# Patient Record
Sex: Female | Born: 1950
Health system: Southern US, Community
[De-identification: ages and names within clinical notes are randomized; demographics above are authoritative.]

## PROBLEM LIST (undated history)

## (undated) DIAGNOSIS — G4730 Sleep apnea, unspecified: Secondary | ICD-10-CM

## (undated) DIAGNOSIS — R51 Headache: Secondary | ICD-10-CM

## (undated) DIAGNOSIS — E785 Hyperlipidemia, unspecified: Secondary | ICD-10-CM

## (undated) DIAGNOSIS — I10 Essential (primary) hypertension: Secondary | ICD-10-CM

## (undated) DIAGNOSIS — R519 Headache, unspecified: Secondary | ICD-10-CM

## (undated) DIAGNOSIS — K219 Gastro-esophageal reflux disease without esophagitis: Secondary | ICD-10-CM

## (undated) DIAGNOSIS — N189 Chronic kidney disease, unspecified: Secondary | ICD-10-CM

## (undated) DIAGNOSIS — T7840XA Allergy, unspecified, initial encounter: Secondary | ICD-10-CM

## (undated) DIAGNOSIS — M1990 Unspecified osteoarthritis, unspecified site: Secondary | ICD-10-CM

## (undated) DIAGNOSIS — I639 Cerebral infarction, unspecified: Secondary | ICD-10-CM

## (undated) HISTORY — DX: Unspecified osteoarthritis, unspecified site: M19.90

## (undated) HISTORY — DX: Cerebral infarction, unspecified: I63.9

## (undated) HISTORY — DX: Hyperlipidemia, unspecified: E78.5

## (undated) HISTORY — DX: Allergy, unspecified, initial encounter: T78.40XA

## (undated) HISTORY — PX: BACK SURGERY: SHX140

## (undated) HISTORY — DX: Sleep apnea, unspecified: G47.30

## (undated) HISTORY — PX: KNEE ARTHROSCOPY: SUR90

## (undated) HISTORY — DX: Gastro-esophageal reflux disease without esophagitis: K21.9

---

## 1974-05-27 HISTORY — PX: ABDOMINAL HYSTERECTOMY: SHX81

## 1987-05-28 HISTORY — PX: LUMBAR LAMINECTOMY: SHX95

## 1997-10-25 ENCOUNTER — Ambulatory Visit (HOSPITAL_COMMUNITY): Admission: RE | Admit: 1997-10-25 | Discharge: 1997-10-25 | Payer: Self-pay | Admitting: Internal Medicine

## 1998-09-24 ENCOUNTER — Encounter: Payer: Self-pay | Admitting: Internal Medicine

## 1998-09-24 ENCOUNTER — Ambulatory Visit (HOSPITAL_COMMUNITY): Admission: RE | Admit: 1998-09-24 | Discharge: 1998-09-24 | Payer: Self-pay | Admitting: Internal Medicine

## 1999-12-08 ENCOUNTER — Emergency Department (HOSPITAL_COMMUNITY): Admission: EM | Admit: 1999-12-08 | Discharge: 1999-12-08 | Payer: Self-pay

## 1999-12-23 ENCOUNTER — Ambulatory Visit (HOSPITAL_BASED_OUTPATIENT_CLINIC_OR_DEPARTMENT_OTHER): Admission: RE | Admit: 1999-12-23 | Discharge: 1999-12-23 | Payer: Self-pay | Admitting: General Practice

## 2000-01-25 ENCOUNTER — Encounter: Payer: Self-pay | Admitting: Emergency Medicine

## 2000-01-25 ENCOUNTER — Emergency Department (HOSPITAL_COMMUNITY): Admission: EM | Admit: 2000-01-25 | Discharge: 2000-01-25 | Payer: Self-pay | Admitting: Emergency Medicine

## 2000-08-02 ENCOUNTER — Encounter: Payer: Self-pay | Admitting: Neurosurgery

## 2000-08-02 ENCOUNTER — Ambulatory Visit (HOSPITAL_COMMUNITY): Admission: RE | Admit: 2000-08-02 | Discharge: 2000-08-02 | Payer: Self-pay | Admitting: Neurosurgery

## 2001-03-30 ENCOUNTER — Encounter: Admission: RE | Admit: 2001-03-30 | Discharge: 2001-06-28 | Payer: Self-pay | Admitting: Internal Medicine

## 2002-12-12 ENCOUNTER — Encounter: Payer: Self-pay | Admitting: Emergency Medicine

## 2002-12-12 ENCOUNTER — Emergency Department (HOSPITAL_COMMUNITY): Admission: EM | Admit: 2002-12-12 | Discharge: 2002-12-12 | Payer: Self-pay | Admitting: Emergency Medicine

## 2003-05-01 ENCOUNTER — Emergency Department (HOSPITAL_COMMUNITY): Admission: EM | Admit: 2003-05-01 | Discharge: 2003-05-01 | Payer: Self-pay | Admitting: Emergency Medicine

## 2003-06-23 ENCOUNTER — Ambulatory Visit (HOSPITAL_COMMUNITY): Admission: RE | Admit: 2003-06-23 | Discharge: 2003-06-23 | Payer: Self-pay | Admitting: Internal Medicine

## 2004-05-12 ENCOUNTER — Emergency Department (HOSPITAL_COMMUNITY): Admission: EM | Admit: 2004-05-12 | Discharge: 2004-05-12 | Payer: Self-pay | Admitting: Family Medicine

## 2005-02-06 ENCOUNTER — Emergency Department (HOSPITAL_COMMUNITY): Admission: EM | Admit: 2005-02-06 | Discharge: 2005-02-06 | Payer: Self-pay | Admitting: Emergency Medicine

## 2005-02-07 ENCOUNTER — Ambulatory Visit: Payer: Self-pay | Admitting: Internal Medicine

## 2005-02-08 ENCOUNTER — Ambulatory Visit (HOSPITAL_COMMUNITY): Admission: RE | Admit: 2005-02-08 | Discharge: 2005-02-08 | Payer: Self-pay | Admitting: Internal Medicine

## 2005-05-14 ENCOUNTER — Ambulatory Visit: Payer: Self-pay | Admitting: Internal Medicine

## 2005-07-15 ENCOUNTER — Ambulatory Visit: Payer: Self-pay | Admitting: Internal Medicine

## 2005-07-18 ENCOUNTER — Ambulatory Visit: Payer: Self-pay | Admitting: *Deleted

## 2005-08-12 ENCOUNTER — Ambulatory Visit: Payer: Self-pay | Admitting: Internal Medicine

## 2005-08-21 ENCOUNTER — Ambulatory Visit: Payer: Self-pay | Admitting: Internal Medicine

## 2005-08-23 ENCOUNTER — Ambulatory Visit (HOSPITAL_COMMUNITY): Admission: RE | Admit: 2005-08-23 | Discharge: 2005-08-23 | Payer: Self-pay | Admitting: Family Medicine

## 2005-08-28 ENCOUNTER — Ambulatory Visit: Payer: Self-pay | Admitting: Gastroenterology

## 2005-09-27 ENCOUNTER — Encounter (INDEPENDENT_AMBULATORY_CARE_PROVIDER_SITE_OTHER): Payer: Self-pay | Admitting: Specialist

## 2005-09-27 ENCOUNTER — Ambulatory Visit: Payer: Self-pay | Admitting: Gastroenterology

## 2005-11-08 ENCOUNTER — Ambulatory Visit: Payer: Self-pay | Admitting: *Deleted

## 2006-02-21 ENCOUNTER — Ambulatory Visit: Payer: Self-pay | Admitting: Internal Medicine

## 2006-08-07 ENCOUNTER — Ambulatory Visit: Payer: Self-pay | Admitting: Internal Medicine

## 2006-08-28 ENCOUNTER — Ambulatory Visit: Payer: Self-pay | Admitting: Internal Medicine

## 2006-09-02 ENCOUNTER — Ambulatory Visit (HOSPITAL_COMMUNITY): Admission: RE | Admit: 2006-09-02 | Discharge: 2006-09-02 | Payer: Self-pay | Admitting: Internal Medicine

## 2006-09-15 ENCOUNTER — Encounter: Admission: RE | Admit: 2006-09-15 | Discharge: 2006-12-14 | Payer: Self-pay | Admitting: Internal Medicine

## 2006-10-10 ENCOUNTER — Ambulatory Visit: Payer: Self-pay | Admitting: Internal Medicine

## 2007-02-11 ENCOUNTER — Encounter (INDEPENDENT_AMBULATORY_CARE_PROVIDER_SITE_OTHER): Payer: Self-pay | Admitting: *Deleted

## 2007-02-11 ENCOUNTER — Telehealth (INDEPENDENT_AMBULATORY_CARE_PROVIDER_SITE_OTHER): Payer: Self-pay | Admitting: *Deleted

## 2007-02-13 DIAGNOSIS — G4730 Sleep apnea, unspecified: Secondary | ICD-10-CM | POA: Insufficient documentation

## 2007-02-13 DIAGNOSIS — E785 Hyperlipidemia, unspecified: Secondary | ICD-10-CM | POA: Insufficient documentation

## 2007-03-04 ENCOUNTER — Telehealth (INDEPENDENT_AMBULATORY_CARE_PROVIDER_SITE_OTHER): Payer: Self-pay | Admitting: *Deleted

## 2007-03-09 ENCOUNTER — Emergency Department (HOSPITAL_COMMUNITY): Admission: EM | Admit: 2007-03-09 | Discharge: 2007-03-09 | Payer: Self-pay | Admitting: Family Medicine

## 2007-03-12 ENCOUNTER — Ambulatory Visit: Payer: Self-pay | Admitting: Nurse Practitioner

## 2007-03-18 DIAGNOSIS — E1165 Type 2 diabetes mellitus with hyperglycemia: Secondary | ICD-10-CM

## 2007-03-18 DIAGNOSIS — E119 Type 2 diabetes mellitus without complications: Secondary | ICD-10-CM | POA: Insufficient documentation

## 2007-04-22 ENCOUNTER — Encounter (INDEPENDENT_AMBULATORY_CARE_PROVIDER_SITE_OTHER): Payer: Self-pay | Admitting: Internal Medicine

## 2007-05-25 ENCOUNTER — Ambulatory Visit: Payer: Self-pay | Admitting: Nurse Practitioner

## 2007-05-25 ENCOUNTER — Encounter (INDEPENDENT_AMBULATORY_CARE_PROVIDER_SITE_OTHER): Payer: Self-pay | Admitting: Internal Medicine

## 2007-05-26 ENCOUNTER — Telehealth (INDEPENDENT_AMBULATORY_CARE_PROVIDER_SITE_OTHER): Payer: Self-pay | Admitting: Nurse Practitioner

## 2007-05-27 ENCOUNTER — Telehealth (INDEPENDENT_AMBULATORY_CARE_PROVIDER_SITE_OTHER): Payer: Self-pay | Admitting: *Deleted

## 2007-06-05 ENCOUNTER — Ambulatory Visit (HOSPITAL_COMMUNITY): Admission: RE | Admit: 2007-06-05 | Discharge: 2007-06-05 | Payer: Self-pay | Admitting: Family Medicine

## 2007-06-09 ENCOUNTER — Telehealth (INDEPENDENT_AMBULATORY_CARE_PROVIDER_SITE_OTHER): Payer: Self-pay | Admitting: *Deleted

## 2007-07-02 ENCOUNTER — Encounter (INDEPENDENT_AMBULATORY_CARE_PROVIDER_SITE_OTHER): Payer: Self-pay | Admitting: Internal Medicine

## 2007-07-07 ENCOUNTER — Ambulatory Visit: Payer: Self-pay | Admitting: Internal Medicine

## 2007-07-07 LAB — CONVERTED CEMR LAB
Blood Glucose, Fingerstick: 93
Hgb A1c MFr Bld: 7.5 %

## 2007-07-10 ENCOUNTER — Telehealth (INDEPENDENT_AMBULATORY_CARE_PROVIDER_SITE_OTHER): Payer: Self-pay | Admitting: Internal Medicine

## 2007-07-15 ENCOUNTER — Telehealth (INDEPENDENT_AMBULATORY_CARE_PROVIDER_SITE_OTHER): Payer: Self-pay | Admitting: Internal Medicine

## 2007-08-12 ENCOUNTER — Telehealth (INDEPENDENT_AMBULATORY_CARE_PROVIDER_SITE_OTHER): Payer: Self-pay | Admitting: Internal Medicine

## 2007-11-24 ENCOUNTER — Encounter (INDEPENDENT_AMBULATORY_CARE_PROVIDER_SITE_OTHER): Payer: Self-pay | Admitting: Internal Medicine

## 2007-12-30 ENCOUNTER — Telehealth (INDEPENDENT_AMBULATORY_CARE_PROVIDER_SITE_OTHER): Payer: Self-pay | Admitting: Internal Medicine

## 2008-01-01 ENCOUNTER — Ambulatory Visit: Payer: Self-pay | Admitting: Internal Medicine

## 2008-01-01 DIAGNOSIS — I10 Essential (primary) hypertension: Secondary | ICD-10-CM | POA: Insufficient documentation

## 2008-01-03 ENCOUNTER — Ambulatory Visit (HOSPITAL_COMMUNITY): Admission: RE | Admit: 2008-01-03 | Discharge: 2008-01-03 | Payer: Self-pay | Admitting: Internal Medicine

## 2008-01-08 ENCOUNTER — Telehealth (INDEPENDENT_AMBULATORY_CARE_PROVIDER_SITE_OTHER): Payer: Self-pay | Admitting: Internal Medicine

## 2008-01-15 ENCOUNTER — Ambulatory Visit: Payer: Self-pay | Admitting: Internal Medicine

## 2008-01-16 DIAGNOSIS — M539 Dorsopathy, unspecified: Secondary | ICD-10-CM | POA: Insufficient documentation

## 2008-02-13 ENCOUNTER — Emergency Department (HOSPITAL_COMMUNITY): Admission: EM | Admit: 2008-02-13 | Discharge: 2008-02-13 | Payer: Self-pay | Admitting: Emergency Medicine

## 2008-02-17 ENCOUNTER — Telehealth (INDEPENDENT_AMBULATORY_CARE_PROVIDER_SITE_OTHER): Payer: Self-pay | Admitting: Internal Medicine

## 2008-02-22 ENCOUNTER — Emergency Department (HOSPITAL_COMMUNITY): Admission: EM | Admit: 2008-02-22 | Discharge: 2008-02-22 | Payer: Self-pay | Admitting: Emergency Medicine

## 2008-02-23 ENCOUNTER — Emergency Department (HOSPITAL_COMMUNITY): Admission: EM | Admit: 2008-02-23 | Discharge: 2008-02-23 | Payer: Self-pay | Admitting: Emergency Medicine

## 2008-02-26 ENCOUNTER — Ambulatory Visit: Payer: Self-pay | Admitting: Internal Medicine

## 2008-02-26 LAB — CONVERTED CEMR LAB
ALT: 53 units/L — ABNORMAL HIGH (ref 0–35)
Albumin: 4.3 g/dL (ref 3.5–5.2)
Alkaline Phosphatase: 73 units/L (ref 39–117)
Basophils Relative: 1 % (ref 0–1)
Bilirubin Urine: NEGATIVE
Blood in Urine, dipstick: NEGATIVE
CO2: 24 meq/L (ref 19–32)
Glucose, Urine, Semiquant: NEGATIVE
Hemoglobin: 14.1 g/dL (ref 12.0–15.0)
MCHC: 33.4 g/dL (ref 30.0–36.0)
MCV: 86.5 fL (ref 78.0–100.0)
Monocytes Absolute: 0.3 10*3/uL (ref 0.1–1.0)
Monocytes Relative: 8 % (ref 3–12)
Neutro Abs: 2.3 10*3/uL (ref 1.7–7.7)
Potassium: 4.4 meq/L (ref 3.5–5.3)
Protein, U semiquant: NEGATIVE
RBC: 4.88 M/uL (ref 3.87–5.11)
Sodium: 138 meq/L (ref 135–145)
Specific Gravity, Urine: >=1.03
Total Bilirubin: 0.4 mg/dL (ref 0.3–1.2)
Total Protein: 8.5 g/dL — ABNORMAL HIGH (ref 6.0–8.3)

## 2008-02-27 ENCOUNTER — Encounter (INDEPENDENT_AMBULATORY_CARE_PROVIDER_SITE_OTHER): Payer: Self-pay | Admitting: Internal Medicine

## 2008-02-29 ENCOUNTER — Ambulatory Visit (HOSPITAL_COMMUNITY): Admission: RE | Admit: 2008-02-29 | Discharge: 2008-02-29 | Payer: Self-pay | Admitting: Internal Medicine

## 2008-03-01 ENCOUNTER — Ambulatory Visit: Payer: Self-pay | Admitting: Internal Medicine

## 2008-03-11 LAB — CONVERTED CEMR LAB
HCV Ab: NEGATIVE
Hep A IgM: NEGATIVE
Hep B Core Total Ab: NEGATIVE
Hep B S Ab: NEGATIVE

## 2008-03-17 ENCOUNTER — Encounter (INDEPENDENT_AMBULATORY_CARE_PROVIDER_SITE_OTHER): Payer: Self-pay | Admitting: Internal Medicine

## 2008-03-17 ENCOUNTER — Ambulatory Visit: Payer: Self-pay | Admitting: Family Medicine

## 2008-03-17 LAB — CONVERTED CEMR LAB
AST: 21 units/L (ref 0–37)
Alkaline Phosphatase: 63 units/L (ref 39–117)
BUN: 10 mg/dL (ref 6–23)
Calcium: 8.3 mg/dL — ABNORMAL LOW (ref 8.4–10.5)
Creatinine, Ser: 0.63 mg/dL (ref 0.40–1.20)
HDL: 47 mg/dL (ref >39)
Hgb A1c MFr Bld: 7.5 %
LDL Cholesterol: 146 mg/dL — ABNORMAL HIGH (ref 0–99)
Total Bilirubin: 0.5 mg/dL (ref 0.3–1.2)
Total CHOL/HDL Ratio: 4.4
VLDL: 16 mg/dL (ref 0–40)

## 2008-04-03 ENCOUNTER — Emergency Department (HOSPITAL_COMMUNITY): Admission: EM | Admit: 2008-04-03 | Discharge: 2008-04-03 | Payer: Self-pay | Admitting: Emergency Medicine

## 2008-05-11 ENCOUNTER — Telehealth: Payer: Self-pay | Admitting: *Deleted

## 2008-05-12 ENCOUNTER — Telehealth (INDEPENDENT_AMBULATORY_CARE_PROVIDER_SITE_OTHER): Payer: Self-pay | Admitting: Internal Medicine

## 2008-05-16 ENCOUNTER — Encounter (INDEPENDENT_AMBULATORY_CARE_PROVIDER_SITE_OTHER): Payer: Self-pay | Admitting: Internal Medicine

## 2008-05-30 ENCOUNTER — Ambulatory Visit: Payer: Self-pay | Admitting: Family Medicine

## 2008-05-30 LAB — CONVERTED CEMR LAB

## 2008-06-01 ENCOUNTER — Encounter: Payer: Self-pay | Admitting: Family Medicine

## 2008-06-10 ENCOUNTER — Encounter (INDEPENDENT_AMBULATORY_CARE_PROVIDER_SITE_OTHER): Payer: Self-pay | Admitting: Internal Medicine

## 2008-06-23 ENCOUNTER — Telehealth: Payer: Self-pay | Admitting: Family Medicine

## 2008-06-23 ENCOUNTER — Ambulatory Visit: Payer: Self-pay | Admitting: Family Medicine

## 2008-07-26 ENCOUNTER — Ambulatory Visit: Payer: Self-pay | Admitting: Family Medicine

## 2008-07-26 DIAGNOSIS — J309 Allergic rhinitis, unspecified: Secondary | ICD-10-CM | POA: Insufficient documentation

## 2008-07-26 LAB — CONVERTED CEMR LAB: Hgb A1c MFr Bld: 7.2 %

## 2008-08-19 ENCOUNTER — Ambulatory Visit: Payer: Self-pay | Admitting: Family Medicine

## 2008-08-29 ENCOUNTER — Ambulatory Visit: Payer: Self-pay | Admitting: Family Medicine

## 2008-08-29 LAB — CONVERTED CEMR LAB: HDL goal, serum: 40 mg/dL

## 2008-09-26 ENCOUNTER — Ambulatory Visit: Payer: Self-pay | Admitting: Family Medicine

## 2009-02-20 ENCOUNTER — Ambulatory Visit: Payer: Self-pay | Admitting: Family Medicine

## 2009-02-22 ENCOUNTER — Encounter: Payer: Self-pay | Admitting: Family Medicine

## 2009-02-24 ENCOUNTER — Encounter: Payer: Self-pay | Admitting: Family Medicine

## 2009-02-27 ENCOUNTER — Telehealth: Payer: Self-pay | Admitting: *Deleted

## 2009-03-27 ENCOUNTER — Encounter: Payer: Self-pay | Admitting: *Deleted

## 2009-03-27 ENCOUNTER — Ambulatory Visit: Payer: Self-pay | Admitting: Family Medicine

## 2009-03-27 LAB — CONVERTED CEMR LAB
AST: 21 units/L (ref 0–37)
Albumin: 4.4 g/dL (ref 3.5–5.2)
BUN: 13 mg/dL (ref 6–23)
CO2: 26 meq/L (ref 19–32)
Calcium: 8.7 mg/dL (ref 8.4–10.5)
Chloride: 105 meq/L (ref 96–112)
Cholesterol: 160 mg/dL (ref 0–200)
HDL: 47 mg/dL (ref >39)
Potassium: 3.6 meq/L (ref 3.5–5.3)

## 2009-04-10 ENCOUNTER — Ambulatory Visit: Payer: Self-pay | Admitting: Family Medicine

## 2009-07-04 ENCOUNTER — Emergency Department (HOSPITAL_COMMUNITY): Admission: EM | Admit: 2009-07-04 | Discharge: 2009-07-04 | Payer: Self-pay | Admitting: Emergency Medicine

## 2009-11-22 ENCOUNTER — Ambulatory Visit: Payer: Self-pay | Admitting: Family Medicine

## 2009-11-22 DIAGNOSIS — M7710 Lateral epicondylitis, unspecified elbow: Secondary | ICD-10-CM | POA: Insufficient documentation

## 2009-11-22 LAB — CONVERTED CEMR LAB: Hgb A1c MFr Bld: 6.9 %

## 2009-12-19 ENCOUNTER — Telehealth: Payer: Self-pay | Admitting: *Deleted

## 2010-04-06 ENCOUNTER — Encounter: Payer: Self-pay | Admitting: Family Medicine

## 2010-06-17 ENCOUNTER — Encounter: Payer: Self-pay | Admitting: Internal Medicine

## 2010-06-26 NOTE — Assessment & Plan Note (Signed)
Summary: back pain,tcb   Vital Signs:  Patient profile:   60 year old female Height:      62.7 inches Weight:      164 pounds BMI:     29.44 BSA:     1.77 Temp:     98.6 degrees F Pulse rate:   77 / minute BP sitting:   156 / 91  Vitals Entered By: Jone Baseman CMA (November 22, 2009 11:28 AM) CC: back pain Is Patient Diabetic? Yes Did you bring your meter with you today? No Pain Assessment Patient in pain? yes     Location: back, hips and legs Intensity: 8   CC:  back pain.  History of Present Illness: Back Pain continues to have significant back pain primarily bilaterally  in the lower back.  No incontinence is better with rest and wose with movement.  No radiation to lower legs but they sometimes feel weak from the pain.  She never started lyrica because of cost.   Has seen NS in 2009.  Has done PT in distant past and worn a TENS unit.  DIABETES Disease Monitoring Blood Sugar ranges:not checkign   Polyuria:N   Visual problems:N Medications Compliance:daily amyrl   Hypoglycemic symptoms:N  Pain in left elbow for last few weeks.  Worse with movement.  No trauma.  No loss of sensation or strength  ROS - as above PMH - Medications reviewed and updated in medication list.  Smoking Status noted in VS form    Habits & Providers  Alcohol-Tobacco-Diet     Tobacco Status: never  Current Medications (verified): 1)  Amaryl 4 Mg  Tabs (Glimepiride) .Marland Kitchen.. 1 Tab By Mouth Daily 2)  Norvasc 5 Mg  Tabs (Amlodipine Besylate) .Marland Kitchen.. 1 By Mouth Once Daily For Blood Pressure 3)  Flexeril 10 Mg Tabs (Cyclobenzaprine Hcl) .Marland Kitchen.. 1 As Needed For Severe Muscle Pain 4)  Lisinopril 10 Mg  Tabs (Lisinopril) .... Take 1 Tab By Mouth Daily For Blood Pressure 5)  Simvastatin 20 Mg Tabs (Simvastatin) .Marland Kitchen.. 1 By Mouth At Bedtime For Cholesterol 6)  Fluticasone Propionate 50 Mcg/act  Susp (Fluticasone Propionate) .... 2 Sprays Each Nostril Once Daily. 1 Mdi 7)  Vicodin 5-500 Mg Tabs  (Hydrocodone-Acetaminophen) .... One Tab Up To Once A Day For Severe Pain 8)  Ambien 10 Mg Tabs (Zolpidem Tartrate) .Marland Kitchen.. 1 By Mouth At Bedtime Not More Than Twice A Week  Allergies: 1)  ! Asa 2)  ! Pcn 3)  ! * Dye  Social History: Smoking Status:  never  Physical Exam  General:  Able to move around room and get on exam table but slowly stiffly with pain  Msk:  Back - no deformity or focal tenderness R elbow - focally tender over lat epicondyle without deformity.   Neurologic:  Distal strength in all extremities 5/5.  Mild SLR pain bitaterally.  Good range of motion of both hips   Diabetes Management Exam:    Foot Exam (with socks and/or shoes not present):       Sensory-Pinprick/Light touch:          Left medial foot (L-4): normal          Left dorsal foot (L-5): normal          Left lateral foot (S-1): normal          Right medial foot (L-4): normal          Right dorsal foot (L-5): normal  Right lateral foot (S-1): normal       Sensory-Monofilament:          Left foot: normal          Right foot: normal       Inspection:          Left foot: normal          Right foot: normal       Nails:          Left foot: normal          Right foot: normal   Impression & Recommendations:  Problem # 1:  DEGENERATIVE JOINT DISEASE (ICD-715.90) Assessment Deteriorated  Worsened pain in her back.  No signs of cancer or fracture or impingement.  Has been evaluated by NS in 2009.  Recent normal plain films.  Will give trial of desipramine for chronic pain if not sufficient will refer to PT perhaps for another TENS.  Discussed regular chronic narcotic use would not be good   Her updated medication list for this problem includes:    Vicodin 5-500 Mg Tabs (Hydrocodone-acetaminophen) ..... One tab up to once a day for severe pain  Orders: [facility]- Est  Level 4 (47425)  Problem # 2:  DIABETES MELLITUS (ICD-250.00) Good control  Her updated medication list for this problem includes:     Amaryl 4 Mg Tabs (Glimepiride) .Marland Kitchen... 1 tab by mouth daily    Lisinopril 10 Mg Tabs (Lisinopril) .Marland Kitchen... Take 1 tab by mouth daily for blood pressure  Orders: A1C-[facility] (95638) [facility]- Est  Level 4 (75643)  Labs Reviewed: Creat: 0.59 (03/27/2009)    Reviewed HgBA1c results: 6.9 (11/22/2009)  7.0 (02/20/2009)  Problem # 3:  LATERAL EPICONDYLITIS (ICD-726.32)  limited on what medications can use due to NSAID intolerance.  Will use ice and time  Orders: [facility]- Est  Level 4 (32951)  Complete Medication List: 1)  Amaryl 4 Mg Tabs (Glimepiride) .Marland Kitchen.. 1 tab by mouth daily 2)  Norvasc 5 Mg Tabs (Amlodipine besylate) .Marland Kitchen.. 1 by mouth once daily for blood pressure 3)  Flexeril 10 Mg Tabs (Cyclobenzaprine hcl) .Marland Kitchen.. 1 as needed for severe muscle pain 4)  Lisinopril 10 Mg Tabs (Lisinopril) .... Take 1 tab by mouth daily for blood pressure 5)  Simvastatin 20 Mg Tabs (Simvastatin) .Marland Kitchen.. 1 by mouth at bedtime for cholesterol 6)  Fluticasone Propionate 50 Mcg/act Susp (Fluticasone propionate) .... 2 sprays each nostril once daily. 1 mdi 7)  Vicodin 5-500 Mg Tabs (Hydrocodone-acetaminophen) .... One tab up to once a day for severe pain 8)  Ambien 10 Mg Tabs (Zolpidem tartrate) .Marland Kitchen.. 1 by mouth at bedtime not more than twice a week 9)  Desipramine Hcl 25 Mg Tabs (Desipramine hcl) .Marland Kitchen.. 1 every at bedtime increase by one tablet every 3 days until taking 4 tablets a night  Patient Instructions: 1)  Please schedule a follow-up appointment in 3 months .  2)  Call to say how the desipramine is workign after you've taken for about a 1 month 3)  Good work on your diabetes 4)  For your tendonitis on your elbow - ice for 20 minutes three times a day  5)  Schedule your mammogram.  Prescriptions: DESIPRAMINE HCL 25 MG TABS (DESIPRAMINE HCL) 1 every at bedtime increase by one tablet every 3 days until taking 4 tablets a night  #90 x 1   Entered and Authorized by:   Pearlean Brownie MD   Signed by:   Pearlean Brownie  MD  on 11/22/2009   Method used:   Print then Give to Patient   RxID:   [id number]   Laboratory Results   Blood Tests   Date/Time Received: November 22, 2009 11:23 AM  Date/Time Reported: November 22, 2009 11:45 AM   HGBA1C: 6.9%   (Normal Range: Non-Diabetic - 3-6%   Control Diabetic - 6-8%)  Comments: ...........test performed by............Marland KitchenBAJordan, MT(ASCP)11:45 AM entered by Terese Door, CMA         Prevention & Chronic Care Immunizations   Influenza vaccine: Fluvax Non-MCR  (03/27/2009)   Influenza vaccine due: 03/17/2009    Tetanus booster: 09/26/2008: Tdap    Pneumococcal vaccine: Historical  (06/27/2004)   Pneumococcal vaccine due: None  Colorectal Screening   Hemoccult: Not documented   Hemoccult due: Not Indicated    Colonoscopy: Tubular adenoa repeat in 5 yrs  (10/17/2005)   Colonoscopy due: 10/18/2010  Other Screening   Pap smear: Hysterectomy in mid 20s  (05/30/2008)   Pap smear due: Not Indicated    Mammogram: Not documented   Smoking status: never  (11/22/2009)  Diabetes Mellitus   HgbA1C: 6.9  (11/22/2009)   Hemoglobin A1C due: 06/17/2008    Eye exam: Not documented    Foot exam: yes  (11/22/2009)   Foot exam action/deferral: Do today   High risk foot: Not documented   Foot care education: Not documented   Foot exam due: 05/30/2009    Urine microalbumin/creatinine ratio: Not documented    Diabetes flowsheet reviewed?: Yes   Progress toward A1C goal: At goal  Lipids   Total Cholesterol: 160  (03/27/2009)   LDL: 97  (03/27/2009)   LDL Direct: Not documented   HDL: 47  (03/27/2009)   Triglycerides: 79  (03/27/2009)    SGOT (AST): 21  (03/27/2009)   SGPT (ALT): 30  (03/27/2009)   Alkaline phosphatase: 71  (03/27/2009)   Total bilirubin: 0.4  (03/27/2009)    Lipid flowsheet reviewed?: Yes   Progress toward LDL goal: At goal  Hypertension   Last Blood Pressure: 156 / 91  (11/22/2009)   Serum creatinine: 0.59  (03/27/2009)    Serum potassium 3.6  (03/27/2009)    Hypertension flowsheet reviewed?: Yes   Progress toward BP goal: At goal  Self-Management Support :   Personal Goals (by the next clinic visit) :     Personal A1C goal: 8  (02/20/2009)     Personal blood pressure goal: 140/90  (02/20/2009)     Personal LDL goal: 100  (02/20/2009)    Diabetes self-management support: Not documented    Diabetes self-management support not done because: Good outcomes  (02/20/2009)    Hypertension self-management support: Not documented    Hypertension self-management support not done because: Good outcomes  (02/20/2009)    Lipid self-management support: Lipid monitoring log, Written self-care plan  (02/20/2009)    Nursing Instructions: Diabetic foot exam today Diabetic foot exam today

## 2010-06-26 NOTE — Progress Notes (Signed)
Summary: meds  Phone Note Call from Patient Call back at Home Phone [phone]   Caller: Patient Summary of Call: cannot afford brand name needs generic for DESIPRAMINE HCL 25 MG [facility] (pls check to see if they have generic, if not call to [facility] [address]) and let pt know  Ambien & Hydrocodone called to [facility] for refill  Initial call taken by: De Nurse,  December 19, 2009 2:18 PM  Follow-up for Phone Call        Please call in vicodin to the [facility] pharmacy.  Sent rx for desipramine to [facility].  Should not take ambien with the desipramine.  Please let her know Follow-up by: Pearlean Brownie MD,  December 19, 2009 3:08 PM  Additional Follow-up for Phone Call Additional follow up Details #1::        Desipramine too expensive.  [facility]riptylene [facility] Additional Follow-up by: Pearlean Brownie MD,  December 20, 2009 10:34 AM    Additional Follow-up for Phone Call Additional follow up Details #2::    LMOVM for Callback Follow-up by: Jone Baseman CMA,  December 20, 2009 10:50 AM  Additional Follow-up for Phone Call Additional follow up Details #3:: Details for Additional Follow-up Action Taken: Please call pt back concerning the meds sent in for her. Additional Follow-up by: Clydell Hakim,  December 21, 2009 11:02 AM  New/Updated Medications: VICODIN 5-500 MG TABS (HYDROCODONE-ACETAMINOPHEN) one tab up to once a day for severe pain.  May fill monthly NORTRIPTYLINE HCL 25 MG CAPS (NORTRIPTYLINE HCL) 1 by mouth at bedtime Increase by 1 capsule every 3-4 days until taking 4 at bedtime   Pt informed of the above.  Rx called in as requested. ............................................... Shanda Bumps [facility] December 21, 2009 11:21 AM   Prescriptions: NORTRIPTYLINE HCL 25 MG CAPS (NORTRIPTYLINE HCL) 1 by mouth at bedtime Increase by 1 capsule every 3-4 days until taking 4 at bedtime  #90 x 1   Entered and Authorized by:    Pearlean Brownie MD   Signed by:   Pearlean Brownie MD on 12/20/2009   Method used:   Telephoned to ...       [facility] (retail)       [address] [place], [place]  [address]       Ph: [phone]       Fax: [phone]   RxID:   [id number] VICODIN 5-500 MG TABS (HYDROCODONE-ACETAMINOPHEN) one tab up to once a day for severe pain.  May fill monthly  #30 x 2   Entered and Authorized by:   Pearlean Brownie MD   Signed by:   Pearlean Brownie MD on 12/19/2009   Method used:   Telephoned to ...       [facility]* (retail)       [address]       [address]       [place], [place]  [address]       Ph: [phone]       Fax: [phone]   RxID:   [id number] DESIPRAMINE HCL 25 MG TABS (DESIPRAMINE HCL) 1 every at bedtime increase by one tablet every 3 days until taking 4 tablets a night  #90 x 0   Entered and Authorized by:   Pearlean Brownie MD   Signed by:   Pearlean Brownie MD on 12/19/2009   Method used:  Electronically to        [facility] [phone]* (retail)       [address]       [place], [place]  [address]       Ph: [phone]       Fax: [phone]   RxID:   [id number]

## 2010-06-26 NOTE — Miscellaneous (Signed)
  Clinical Lists Changes  Problems: Removed problem of HEADACHE (ICD-784.0) Removed problem of LIVER FUNCTION TESTS, ABNORMAL, HX OF (ICD-V12.2) Removed problem of NECK PAIN, LEFT (ICD-723.1) Removed problem of KNEE PAIN (ICD-719.46)

## 2010-06-29 ENCOUNTER — Emergency Department (HOSPITAL_COMMUNITY)
Admission: EM | Admit: 2010-06-29 | Discharge: 2010-06-30 | Disposition: A | Discharge status: Home / Self Care | Payer: Self-pay | Attending: Emergency Medicine | Admitting: Emergency Medicine

## 2010-06-29 DIAGNOSIS — I10 Essential (primary) hypertension: Secondary | ICD-10-CM | POA: Insufficient documentation

## 2010-06-29 DIAGNOSIS — R1033 Periumbilical pain: Secondary | ICD-10-CM | POA: Insufficient documentation

## 2010-06-29 DIAGNOSIS — E119 Type 2 diabetes mellitus without complications: Secondary | ICD-10-CM | POA: Insufficient documentation

## 2010-06-30 LAB — COMPREHENSIVE METABOLIC PANEL
ALT: 20 U/L (ref 0–35)
CO2: 24 mEq/L (ref 19–32)
Calcium: 9 mg/dL (ref 8.4–10.5)
Creatinine, Ser: 0.64 mg/dL (ref 0.4–1.2)
GFR calc Af Amer: >60 mL/min (ref >60)
GFR calc non Af Amer: >60 mL/min (ref >60)
Glucose, Bld: 287 mg/dL — ABNORMAL HIGH (ref 70–99)
Sodium: 135 mEq/L (ref 135–145)
Total Protein: 8.5 g/dL — ABNORMAL HIGH (ref 6.0–8.3)

## 2010-06-30 LAB — CBC
Platelets: 226 10*3/uL (ref 150–400)
RBC: 4.77 MIL/uL (ref 3.87–5.11)
RDW: 12.5 % (ref 11.5–15.5)
WBC: 4.5 10*3/uL (ref 4.0–10.5)

## 2010-06-30 LAB — LIPASE, BLOOD: Lipase: 27 U/L (ref 11–59)

## 2010-06-30 LAB — URINALYSIS, ROUTINE W REFLEX MICROSCOPIC
Bilirubin Urine: NEGATIVE
Leukocytes, UA: NEGATIVE
Nitrite: NEGATIVE
Specific Gravity, Urine: 1.041 — ABNORMAL HIGH (ref 1.005–1.030)
Urobilinogen, UA: 1 mg/dL (ref 0.0–1.0)
pH: 5.5 (ref 5.0–8.0)

## 2010-06-30 LAB — DIFFERENTIAL
Basophils Absolute: 0 10*3/uL (ref 0.0–0.1)
Basophils Relative: 0 % (ref 0–1)
Eosinophils Absolute: 0.1 10*3/uL (ref 0.0–0.7)
Eosinophils Relative: 2 % (ref 0–5)
Neutrophils Relative %: 59 % (ref 43–77)

## 2010-07-10 ENCOUNTER — Other Ambulatory Visit: Payer: Self-pay | Admitting: Family Medicine

## 2010-07-10 MED ORDER — AMLODIPINE BESYLATE 5 MG PO TABS: 5.0000 mg | ORAL_TABLET | Freq: Every day | ORAL | Status: DC

## 2010-07-19 ENCOUNTER — Other Ambulatory Visit: Payer: Self-pay | Admitting: Family Medicine

## 2010-07-19 DIAGNOSIS — G4700 Insomnia, unspecified: Secondary | ICD-10-CM

## 2010-07-19 DIAGNOSIS — M1990 Unspecified osteoarthritis, unspecified site: Secondary | ICD-10-CM

## 2010-07-19 MED ORDER — ZOLPIDEM TARTRATE 10 MG PO TABS
10.0000 mg | ORAL_TABLET | Freq: Every evening | ORAL | Status: DC | PRN
Start: 2010-07-19 — End: 2010-10-08

## 2010-07-19 MED ORDER — HYDROCODONE-ACETAMINOPHEN 5-500 MG PO TABS: 1.0000 | ORAL_TABLET | Freq: Every day | ORAL | Status: DC | PRN

## 2010-08-02 ENCOUNTER — Telehealth: Payer: Self-pay | Admitting: Family Medicine

## 2010-08-02 ENCOUNTER — Ambulatory Visit (INDEPENDENT_AMBULATORY_CARE_PROVIDER_SITE_OTHER): Payer: Self-pay | Admitting: Family Medicine

## 2010-08-02 ENCOUNTER — Encounter: Payer: Self-pay | Admitting: Family Medicine

## 2010-08-02 VITALS — BP 124/76 | HR 78 | Wt 162.0 lb

## 2010-08-02 DIAGNOSIS — M1990 Unspecified osteoarthritis, unspecified site: Secondary | ICD-10-CM

## 2010-08-02 DIAGNOSIS — E785 Hyperlipidemia, unspecified: Secondary | ICD-10-CM

## 2010-08-02 LAB — CONVERTED CEMR LAB
BUN: 12 mg/dL (ref 6–23)
CO2: 22 meq/L (ref 19–32)
Chloride: 102 meq/L (ref 96–112)
Glucose, Bld: 267 mg/dL — ABNORMAL HIGH (ref 70–99)
Potassium: 3.8 meq/L (ref 3.5–5.3)
Sodium: 137 meq/L (ref 135–145)

## 2010-08-02 LAB — BASIC METABOLIC PANEL
CO2: 22 mEq/L (ref 19–32)
Chloride: 102 mEq/L (ref 96–112)
Potassium: 3.8 mEq/L (ref 3.5–5.3)

## 2010-08-02 MED ORDER — PREDNISONE 20 MG PO TABS: ORAL_TABLET | ORAL | Status: DC

## 2010-08-02 MED ORDER — SIMVASTATIN 20 MG PO TABS: 20.0000 mg | ORAL_TABLET | Freq: Every day | ORAL | Status: DC

## 2010-08-02 MED ORDER — CYCLOBENZAPRINE HCL 10 MG PO TABS: 5.0000 mg | ORAL_TABLET | Freq: Two times a day (BID) | ORAL | Status: DC | PRN

## 2010-08-02 MED ORDER — DESIPRAMINE HCL 25 MG PO TABS: ORAL_TABLET | ORAL | Status: DC

## 2010-08-02 NOTE — Assessment & Plan Note (Signed)
Chronic history of back pain, hip, pain.  Has been evaluated by [facility].  She currently takes vicodin for pain.  Today states that she is having some cramping of lower legs and locking of knees.  Tramadol did not help.  Has ho of gi upset with NSAIDS.  Lyrica was too expensive.  Pt not sure if Nortriptyline helped.    We discussed that DJD will leave her with chronic pain and there are times when the pain will be worse than other times.  Pt states that her mother has arthritis and she sees the pain her mother is in and does not want this her.  We discussed that I want to rule out hypokalemia as cause of cramping pain or that an inflammatory process is causing her joint pain.  Will check Bmet and ESR today.  Will try Desipramine since it may help with pain and cramping.  She can try 25mg  for 1 wk then increase it to 50mg  at night or 25mg  bid.   Advised pt to see Dr Deirdre Priest for f/u.  I will refill flexeril per pt's request.

## 2010-08-02 NOTE — Assessment & Plan Note (Signed)
Refilled Simvastatin for 1 month.  Pt has been out of it for a month.

## 2010-08-02 NOTE — Telephone Encounter (Signed)
Called pt re ESR 43, which is elevated for her age of 13.  Discussed the implications of this and that Prednisone may help. Discussed that I am not sure that Prednisone will work 50% or 90%.  Discussed side effects with pt: fluid/Na retention which may lead to elevation in BP (she already has HTN),  Insomnia, nervousness, increased appetite, headache, mood swings.  Discussed that she does not have to take Prednisone if she does not want to.  Pt asked for other pain medicine.  I declined and discussed that she can take the Desipramine I Rx earlier today.  Pt states she wanted to try Prednisone. I called in Pred[facility]one 40mg  [facility] x [facility]

## 2010-08-02 NOTE — Progress Notes (Signed)
  Subjective:    Patient ID: Tiffany [name], female    DOB: December 23, 1950, 60 y.o.   MRN: [id number]  HPI BACK PAIN "I have problems with my back all the time anyway, but now it is going into my legs."  She also complains of locking of hip and knees that cause her to almost fall.  Left knee is worse right knee.  She states multiple joint pains, even in her elbows and fingers.  Things have gotten worse in the past 3 wks in the hip.   Modifying factors: Has extensive history of DJD.  Has been seen by [facility] 2009 in the past.  She has had PT in the past, but today states that it made it worse in that her back spasms were worse with PT.  Back spasm is not bothering her much right now.  Trying to use hot compress for cramping in her legs. Has tried gabapentin, which did not help.  Never took Lyrica because it was too expensive.  Nortriptyline did not really help.    Red Flags Fecal/urinary incontinence: no   Weakness: no Fever/chills: no Night pain: no.  Pain all the time. Unexplained weight loss: no No relief with bedrest: not able to sleep Cancer/immunosuppression: no IV drug use: no PMH of osteoporosis or chronic steroid use: no    Review of Systems Per hpi     Objective:   Physical Exam Gen: NAD, vitals reviewed MUSC: no spinal abnormality.  +tenderness with deep palpation in her back.  Sitting and lying straight and cross leg raise causes low back pain.  No palpable pain in knees.  No swelling knees.  +crepitus in both needs.  Ankles stable.  Sensation intact in b/l LE and feet.  LE reflexes present +2.  Strength 4/5 in b/l LE.         Assessment & Plan:

## 2010-08-02 NOTE — Patient Instructions (Signed)
Please make an appointment with Dr Deirdre Priest in 2-4 weeks.  I will check labs today to see if your cramping is due to low potassium or related to an inflammatory process.  I will call you with the result and if new medicine needed to be given for these. For your cramping pain you can try Desipramine 25mg  at night for the first week.  During the 2nd week you can increase this to 2 tablets at night if it seems to help.  You can also take one tablet twice a day if it is helpful.

## 2010-08-13 ENCOUNTER — Ambulatory Visit (INDEPENDENT_AMBULATORY_CARE_PROVIDER_SITE_OTHER): Payer: Self-pay

## 2010-08-13 ENCOUNTER — Inpatient Hospital Stay (INDEPENDENT_AMBULATORY_CARE_PROVIDER_SITE_OTHER)
Admission: RE | Admit: 2010-08-13 | Discharge: 2010-08-13 | Disposition: A | Discharge status: Home / Self Care | Payer: Self-pay | Source: Ambulatory Visit | Attending: Emergency Medicine | Admitting: Emergency Medicine

## 2010-08-13 DIAGNOSIS — M65849 Other synovitis and tenosynovitis, unspecified hand: Secondary | ICD-10-CM

## 2010-08-13 DIAGNOSIS — M65839 Other synovitis and tenosynovitis, unspecified forearm: Secondary | ICD-10-CM

## 2010-08-15 ENCOUNTER — Ambulatory Visit: Payer: Self-pay | Admitting: Family Medicine

## 2010-08-31 ENCOUNTER — Other Ambulatory Visit: Payer: Self-pay | Admitting: Family Medicine

## 2010-08-31 NOTE — Telephone Encounter (Signed)
Refill request

## 2010-09-05 NOTE — Telephone Encounter (Signed)
Called in and patient informed.

## 2010-09-17 ENCOUNTER — Other Ambulatory Visit: Payer: Self-pay | Admitting: Family Medicine

## 2010-09-17 DIAGNOSIS — M1990 Unspecified osteoarthritis, unspecified site: Secondary | ICD-10-CM

## 2010-09-17 MED ORDER — LISINOPRIL 10 MG PO TABS
10.0000 mg | ORAL_TABLET | Freq: Every day | ORAL | Status: DC
Start: 2010-09-17 — End: 2010-11-19

## 2010-09-17 MED ORDER — SIMVASTATIN 20 MG PO TABS: 20.0000 mg | ORAL_TABLET | Freq: Every day | ORAL | Status: DC

## 2010-09-17 MED ORDER — CYCLOBENZAPRINE HCL 10 MG PO TABS: 5.0000 mg | ORAL_TABLET | Freq: Two times a day (BID) | ORAL | Status: DC | PRN

## 2010-10-08 ENCOUNTER — Encounter: Payer: Self-pay | Admitting: Family Medicine

## 2010-10-08 ENCOUNTER — Ambulatory Visit (INDEPENDENT_AMBULATORY_CARE_PROVIDER_SITE_OTHER): Payer: Self-pay | Admitting: Family Medicine

## 2010-10-08 DIAGNOSIS — E119 Type 2 diabetes mellitus without complications: Secondary | ICD-10-CM

## 2010-10-08 DIAGNOSIS — M1990 Unspecified osteoarthritis, unspecified site: Secondary | ICD-10-CM

## 2010-10-08 DIAGNOSIS — E785 Hyperlipidemia, unspecified: Secondary | ICD-10-CM

## 2010-10-08 DIAGNOSIS — I10 Essential (primary) hypertension: Secondary | ICD-10-CM

## 2010-10-08 DIAGNOSIS — G4700 Insomnia, unspecified: Secondary | ICD-10-CM

## 2010-10-08 LAB — LIPID PANEL
HDL: 52 mg/dL (ref >39)
LDL Cholesterol: 165 mg/dL — ABNORMAL HIGH (ref 0–99)

## 2010-10-08 LAB — HEPATIC FUNCTION PANEL
ALT: 17 U/L (ref 0–35)
AST: 15 U/L (ref 0–37)
Albumin: 4.2 g/dL (ref 3.5–5.2)
Alkaline Phosphatase: 74 U/L (ref 39–117)
Indirect Bilirubin: 0.4 mg/dL (ref 0.0–0.9)
Total Protein: 8 g/dL (ref 6.0–8.3)

## 2010-10-08 LAB — CK: Total CK: 64 U/L (ref 7–177)

## 2010-10-08 MED ORDER — GLIMEPIRIDE 4 MG PO TABS: 4.0000 mg | ORAL_TABLET | Freq: Every day | ORAL | Status: DC

## 2010-10-08 MED ORDER — ZOLPIDEM TARTRATE 10 MG PO TABS
10.0000 mg | ORAL_TABLET | Freq: Every evening | ORAL | Status: DC | PRN
Start: 2010-10-08 — End: 2011-02-01

## 2010-10-08 MED ORDER — CYCLOBENZAPRINE HCL 10 MG PO TABS: 10.0000 mg | ORAL_TABLET | Freq: Every day | ORAL | Status: DC | PRN

## 2010-10-08 MED ORDER — ZOLPIDEM TARTRATE 10 MG PO TABS: 10.0000 mg | ORAL_TABLET | Freq: Every evening | ORAL | Status: DC | PRN

## 2010-10-08 MED ORDER — HYDROCODONE-ACETAMINOPHEN 5-500 MG PO TABS: 1.0000 | ORAL_TABLET | Freq: Every day | ORAL | Status: DC | PRN

## 2010-10-08 NOTE — Progress Notes (Signed)
  Subjective:    Patient ID: Tiffany [name], female    DOB: [birth year], 60 y.o.   MRN: [id number]  HPI  Joint Pain Location: both knees and back and both shoulders L > R.   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  Years ago had injections.  Recent ESR was 40.   Has tried gabapentin and nortriptylene without help  HYPERTENSION Disease Monitoring Blood pressure range-not checking Chest pain- no      Dyspnea- no Medications Compliance- lisinopril daily Lightheadedness- no   Edema- no   Review of Symptoms - see HPI  PMH - Smoking status noted.    Review of Systems     Objective:   Physical Exam    Able to get up and down from exam table dress and undress without problems and in a reasonable time Pain with walking on toes and heels and can only deep knee bend a few inches FROM of both knees and shoulders but with pain and mild crepitus.  No effusions.  No locking Skin - no rashes Hands - no swelling of joints     Assessment & Plan:

## 2010-10-08 NOTE — Patient Instructions (Signed)
Check your blood pressure 1-2 x a week and bring in the readings next week  Use Tylenol and vicodin and flexaril as needed for severe pain  I will call you if your lab tests are not normal.  Otherwise we will discuss them at your next visit.

## 2010-10-08 NOTE — Assessment & Plan Note (Signed)
Not well controlled today.  Have her check readings at home and follow up on one month

## 2010-10-08 NOTE — Assessment & Plan Note (Signed)
Diffuse pain in joints and muscles.  I think this is a combination of DJD and possibly fibromyalgia. Her pain seems out of proportion to physical findings  Will check a CK.   All really have to offer is prn analgesics.   If CK is negative consider PT referral and perhaps trial off statins

## 2010-11-19 ENCOUNTER — Encounter: Payer: Self-pay | Admitting: Family Medicine

## 2010-11-19 ENCOUNTER — Ambulatory Visit (INDEPENDENT_AMBULATORY_CARE_PROVIDER_SITE_OTHER): Payer: Self-pay | Admitting: Family Medicine

## 2010-11-19 DIAGNOSIS — E119 Type 2 diabetes mellitus without complications: Secondary | ICD-10-CM

## 2010-11-19 DIAGNOSIS — I10 Essential (primary) hypertension: Secondary | ICD-10-CM

## 2010-11-19 DIAGNOSIS — M1990 Unspecified osteoarthritis, unspecified site: Secondary | ICD-10-CM

## 2010-11-19 DIAGNOSIS — E785 Hyperlipidemia, unspecified: Secondary | ICD-10-CM

## 2010-11-19 MED ORDER — GLIMEPIRIDE 4 MG PO TABS: 4.0000 mg | ORAL_TABLET | Freq: Every day | ORAL | Status: DC

## 2010-11-19 MED ORDER — LISINOPRIL 20 MG PO TABS: 20.0000 mg | ORAL_TABLET | Freq: Every day | ORAL | Status: DC

## 2010-11-19 MED ORDER — SIMVASTATIN 40 MG PO TABS: 40.0000 mg | ORAL_TABLET | Freq: Every evening | ORAL | Status: DC

## 2010-11-19 NOTE — Assessment & Plan Note (Addendum)
Still pain in both knees and her back, her muscle ache and hands feel intermittently stiff.   Decided to work on her diabetes mellitus and hypertension and lipids for now and she will see Jaynee Eagles.   If not improving may refer to sports medicine.

## 2010-11-19 NOTE — Patient Instructions (Addendum)
Check your fasting blood sugar every day.  If regularly > 200 then call us.  We will likely increase your amaryl.  Bring in your readings  Write down your blood pressure readings and bring in next time.  Take at least once a week.  Come in fasting next visit  Cut out lemonade and decrease your fatty foods.  Increase your simvastatin to 40 mg a day  Stop the Norvasc and we are increasing your lisinopril to 20 mg a day   We will recheck your ESR (sed rate) and A1c in August

## 2010-11-19 NOTE — Assessment & Plan Note (Signed)
Worsened.  Will increase simvastatin

## 2010-11-19 NOTE — Assessment & Plan Note (Signed)
Not optimal control.  Will stop norvasc since need to increase simvastatin and increase lisinopril and follow closely

## 2010-11-19 NOTE — Progress Notes (Signed)
  Subjective:    Patient ID: Tiffany [name], female    DOB: Sep 13, 1950, 60 y.o.   MRN: [id number]  HPI   HYPERTENSION Disease Monitoring: Blood pressure range-not checking but can Chest pain, palpitations- no      Dyspnea- no  Medications: Compliance- daily norvasc and lisinopril Lightheadedness,Syncope- no   Edema- no   DIABETES Disease Monitoring: Blood Sugar ranges-not checking Polyuria/phagia/dipsia- no      Visual problems- no  Medications: Compliance- daily amaryl.  Drinking lots of lemonade lately Hypoglycemic symptoms- no    HYPERLIPIDEMIA Disease Monitoring: See symptoms for Hypertension  Medications: Compliance- daily simvastatin Right upper quadrant pain- no  Muscle aches- no    ROS See HPI above   PMH Smoking Status noted     Review of Systems     Objective:   Physical Exam    Healthy appearing in no distress     Assessment & Plan:

## 2010-11-19 NOTE — Assessment & Plan Note (Signed)
Worsened.  She will monitor sugars and call if high after cutting back on sweets.  May need to increase Amaryl

## 2010-11-21 ENCOUNTER — Telehealth: Payer: Self-pay | Admitting: Family Medicine

## 2010-11-21 MED ORDER — FLUCONAZOLE 150 MG PO TABS: 150.0000 mg | ORAL_TABLET | Freq: Once | ORAL | Status: AC

## 2010-11-21 NOTE — Telephone Encounter (Signed)
Was to use OTC topical.   Should try that first.  Also sent in Rx for diflucan if she wants to use that

## 2010-11-21 NOTE — Telephone Encounter (Signed)
Tiffany [name] was waiting for her  Med for yeast infection.  Spoke with provider after her appt and informed him that she needed this.  Rx not at pharmacy.  Please have rx sent to them.  Contact her when done.  [facility].

## 2010-11-22 NOTE — Telephone Encounter (Signed)
Left message on voicemail, informing patient.

## 2010-11-23 ENCOUNTER — Encounter: Payer: Self-pay | Admitting: Gastroenterology

## 2010-12-14 ENCOUNTER — Telehealth: Payer: Self-pay | Admitting: Family Medicine

## 2010-12-14 NOTE — Telephone Encounter (Signed)
Would like to speak with someone about her blood sugars, last draw was this morning and it was 292 and would like to speak with someone about that.

## 2010-12-14 NOTE — Telephone Encounter (Signed)
Recommend take Amaryl Twice daily and continue to check bs and come in Aug 1

## 2010-12-14 NOTE — Telephone Encounter (Signed)
Spoke with patient and she reports BS reading  have been elevated for past two weeks. She reports readings for past 5 days.   AM fasting readings range 253-292. She has been checking at night occasionally at different times with reading ranging 184-335  . Will forward message to Dr. Deirdre Priest.

## 2010-12-25 ENCOUNTER — Other Ambulatory Visit: Payer: Self-pay | Admitting: Family Medicine

## 2010-12-25 DIAGNOSIS — E119 Type 2 diabetes mellitus without complications: Secondary | ICD-10-CM

## 2010-12-26 ENCOUNTER — Ambulatory Visit: Payer: Self-pay | Admitting: Family Medicine

## 2011-01-09 ENCOUNTER — Ambulatory Visit (INDEPENDENT_AMBULATORY_CARE_PROVIDER_SITE_OTHER): Payer: Self-pay | Admitting: Family Medicine

## 2011-01-09 ENCOUNTER — Encounter: Payer: Self-pay | Admitting: Family Medicine

## 2011-01-09 VITALS — BP 170/98 | HR 83 | Temp 98.6°F | Wt 152.0 lb

## 2011-01-09 DIAGNOSIS — I10 Essential (primary) hypertension: Secondary | ICD-10-CM

## 2011-01-09 DIAGNOSIS — E119 Type 2 diabetes mellitus without complications: Secondary | ICD-10-CM

## 2011-01-09 DIAGNOSIS — G4700 Insomnia, unspecified: Secondary | ICD-10-CM

## 2011-01-09 LAB — POCT GLYCOSYLATED HEMOGLOBIN (HGB A1C): Hemoglobin A1C: 10.9

## 2011-01-09 MED ORDER — METFORMIN HCL 500 MG PO TABS: ORAL_TABLET | ORAL | Status: DC

## 2011-01-09 MED ORDER — LISINOPRIL 20 MG PO TABS: 40.0000 mg | ORAL_TABLET | Freq: Every day | ORAL | Status: DC

## 2011-01-09 NOTE — Progress Notes (Signed)
  Subjective:    Patient ID: Waymon Amato, female    DOB: 03-02-1951, 60 y.o.   MRN: [id number]  HPI HYPERTENSION Disease Monitoring: Blood pressure range-not checking Chest pain, palpitations- no      Dyspnea- no  Medications: Compliance- did not take lisinopril this AM Lightheadedness,Syncope- no   Edema- no   DIABETES Disease Monitoring: Blood Sugar ranges-in mid 200s fasting Polyuria/phagia/dipsia- no      Visual problems- no  Medications: Compliance- bid amaryl Hypoglycemic symptoms- no    Insomnia No better.  Ambien helps but can't sleep without it.  Under a lot of stress with her mom's illness. Not interested in a counselor.  No evidence of suicidal ideation  ROS See HPI above   PMH Smoking Status noted       Review of Systems     Objective:   Physical Exam  Heart - Regular rate and rhythm.  No murmurs, gallops or rubs.    Lungs:  Normal respiratory effort, chest expands symmetrically. Lungs are clear to auscultation, no crackles or wheezes. Extremities:  No cyanosis, edema, or deformity noted with good range of motion of all major joints.         Assessment & Plan:

## 2011-01-09 NOTE — Patient Instructions (Addendum)
Dealing with your stress is key to your health Consider regular exercise - 20-30 minutes of walking every day Talking things over with a friend Consider seeing a counselor  Check you blood sugars when you first wake up before you eat at least 3-4 times a week  Check your blood pressure and write down your readings and bring in next visit  I have increased your lisinopril dose and added metformin

## 2011-01-09 NOTE — Assessment & Plan Note (Signed)
Not well controlled.  Hctz gave her cramps before.  Creatinine is good.  Will increase lisinopril and monitor.  BMEt next visit

## 2011-01-09 NOTE — Assessment & Plan Note (Signed)
Worsened likely due to stress and diet.  Will try metformin but she did not tolerate this in the distant past.

## 2011-01-09 NOTE — Assessment & Plan Note (Signed)
Likely due to stress perhaps early depression anxiety over her mom's illness.  Watch closely

## 2011-01-17 ENCOUNTER — Telehealth: Payer: Self-pay | Admitting: *Deleted

## 2011-01-17 NOTE — Telephone Encounter (Signed)
Received call from [facility] Dept.  needing clarification on metformin RX. Order states start 1/2 tab a day then increase to one daily then increase to one tablet twice daily. They need to know how long she will take 1/2 tab , then one tab,  before increasing to two times daily.. Call back to Diane at [phone] will forward to Dr. Sheffield Slider preceptor today,

## 2011-01-17 NOTE — Telephone Encounter (Signed)
Dr. Sheffield Slider advises 1/2 tab  daily for a week then one tab daily for a week , then one twice daily. Diane at [facility] notified.

## 2011-01-30 ENCOUNTER — Ambulatory Visit (INDEPENDENT_AMBULATORY_CARE_PROVIDER_SITE_OTHER): Payer: Self-pay | Admitting: Family Medicine

## 2011-01-30 ENCOUNTER — Encounter: Payer: Self-pay | Admitting: Family Medicine

## 2011-01-30 VITALS — BP 160/94 | HR 92 | Temp 98.2°F | Wt 153.0 lb

## 2011-01-30 DIAGNOSIS — M1990 Unspecified osteoarthritis, unspecified site: Secondary | ICD-10-CM

## 2011-01-30 DIAGNOSIS — E119 Type 2 diabetes mellitus without complications: Secondary | ICD-10-CM

## 2011-01-30 DIAGNOSIS — E785 Hyperlipidemia, unspecified: Secondary | ICD-10-CM

## 2011-01-30 DIAGNOSIS — I10 Essential (primary) hypertension: Secondary | ICD-10-CM

## 2011-01-30 MED ORDER — AMLODIPINE BESYLATE 5 MG PO TABS: 5.0000 mg | ORAL_TABLET | Freq: Every day | ORAL | Status: DC

## 2011-01-30 NOTE — Assessment & Plan Note (Signed)
Not at goal despite increased lisinopril.  Will add norvasc which she believes worked well before.  Check labs

## 2011-01-30 NOTE — Assessment & Plan Note (Signed)
Will decrease simvastatin with addition of norvasc.  Will likely need to move to another statin if this dose does not control it

## 2011-01-30 NOTE — Progress Notes (Signed)
  Subjective:    Patient ID: Waymon Amato, female    DOB: Sep 27, 1950, 60 y.o.   MRN: [id number]  HPI  HYPERTENSION Disease Monitoring: Blood pressure range-last was 150/100 Chest pain, palpitations- no      Dyspnea- no  Medications: Compliance- lisinopril Twice daily Lightheadedness,Syncope- no   Edema- no   DIABETES Disease Monitoring: Blood Sugar ranges-250s  Polyuria/phagia/dipsia- no      Visual problems- no  Medications: Compliance- Amaryl and just upped metformin to one whole tablet a day is tolerating ok Hypoglycemic symptoms- no  HYPERLIPIDEMIA Disease Monitoring: See symptoms for Hypertension  Medications: Compliance- daily simvastation Right upper quadrant pain- no  Muscle aches- no  ROS See HPI above   PMH Smoking Status noted      Review of Systems     Objective:   Physical Exam  Heart - Regular rate and rhythm.  No murmurs, gallops or rubs.    Lungs:  Normal respiratory effort, chest expands symmetrically. Lungs are clear to auscultation, no crackles or wheezes. Extremities:  No cyanosis, edema, or deformity noted with good range of motion of all major joints.         Assessment & Plan:

## 2011-01-30 NOTE — Assessment & Plan Note (Signed)
Not much improvement.  Will continue to try to increase Metformin,  Hopefully can tolerate it.

## 2011-01-30 NOTE — Assessment & Plan Note (Signed)
Continues to have diffuse pain.   Would like to be referred to Ortho.  Saw Dr Farris Has in the past.  Recommended against injections or surgery until diabetes and htn are better controlled

## 2011-01-30 NOTE — Patient Instructions (Signed)
Build up to taking Metformin 1 tablet Twice daily  Start norvasc 5 mg daily  Cut back on simvastatin to 1/2 tablet daily  I will call you if your lab tests are not normal.  Otherwise we will discuss them at your next visit.

## 2011-01-31 LAB — BASIC METABOLIC PANEL
BUN: 11 mg/dL (ref 6–23)
Potassium: 4 mEq/L (ref 3.5–5.3)
Sodium: 138 mEq/L (ref 135–145)

## 2011-02-01 ENCOUNTER — Other Ambulatory Visit: Payer: Self-pay | Admitting: Family Medicine

## 2011-02-01 ENCOUNTER — Telehealth: Payer: Self-pay | Admitting: Family Medicine

## 2011-02-01 DIAGNOSIS — G4700 Insomnia, unspecified: Secondary | ICD-10-CM

## 2011-02-01 MED ORDER — HYDROCODONE-ACETAMINOPHEN 5-500 MG PO TABS: 1.0000 | ORAL_TABLET | Freq: Every day | ORAL | Status: DC | PRN

## 2011-02-01 MED ORDER — ZOLPIDEM TARTRATE 10 MG PO TABS: 10.0000 mg | ORAL_TABLET | Freq: Every evening | ORAL | Status: DC | PRN

## 2011-02-01 NOTE — Telephone Encounter (Signed)
Called her and discussed the BMET and that we would be putting in a referral and repeated how to increase her metformin She agrees

## 2011-02-20 ENCOUNTER — Ambulatory Visit: Payer: Self-pay | Admitting: Family Medicine

## 2011-02-25 LAB — URINALYSIS, ROUTINE W REFLEX MICROSCOPIC
Glucose, UA: NEGATIVE
Glucose, UA: NEGATIVE
Ketones, ur: NEGATIVE
Specific Gravity, Urine: 1.024
pH: 5
pH: 6

## 2011-02-25 LAB — CBC
MCHC: 33.7
MCHC: 33.9
MCV: 87.3
Platelets: 214
Platelets: 249
RBC: 4.49
WBC: 3.1 — ABNORMAL LOW

## 2011-02-25 LAB — URINE MICROSCOPIC-ADD ON

## 2011-02-25 LAB — DIFFERENTIAL
Basophils Relative: 1
Eosinophils Relative: 3
Lymphocytes Relative: 19
Lymphs Abs: 0.8
Monocytes Relative: 10
Neutro Abs: 1.9
Neutrophils Relative %: 60

## 2011-02-25 LAB — POCT I-STAT, CHEM 8
Chloride: 103
Glucose, Bld: 125 — ABNORMAL HIGH
HCT: 41
Potassium: 3.6
Sodium: 140

## 2011-02-25 LAB — COMPREHENSIVE METABOLIC PANEL
AST: 73 — ABNORMAL HIGH
Albumin: 3.7
CO2: 25
Calcium: 8.8
Creatinine, Ser: 0.72
GFR calc Af Amer: >60
GFR calc non Af Amer: >60

## 2011-02-26 ENCOUNTER — Other Ambulatory Visit: Payer: Self-pay | Admitting: Family Medicine

## 2011-02-26 MED ORDER — FLUTICASONE PROPIONATE 50 MCG/ACT NA SUSP: 2.0000 | Freq: Every day | NASAL | Status: DC

## 2011-05-02 ENCOUNTER — Other Ambulatory Visit: Payer: Self-pay | Admitting: Family Medicine

## 2011-05-02 DIAGNOSIS — E119 Type 2 diabetes mellitus without complications: Secondary | ICD-10-CM

## 2011-05-02 MED ORDER — GLIMEPIRIDE 4 MG PO TABS: 4.0000 mg | ORAL_TABLET | Freq: Two times a day (BID) | ORAL | Status: DC

## 2011-05-09 ENCOUNTER — Other Ambulatory Visit: Payer: Self-pay | Admitting: Family Medicine

## 2011-05-09 DIAGNOSIS — I10 Essential (primary) hypertension: Secondary | ICD-10-CM

## 2011-05-09 MED ORDER — AMLODIPINE BESYLATE 5 MG PO TABS: 5.0000 mg | ORAL_TABLET | Freq: Every day | ORAL | Status: DC

## 2011-06-12 ENCOUNTER — Other Ambulatory Visit: Payer: Self-pay | Admitting: Family Medicine

## 2011-06-12 DIAGNOSIS — M1990 Unspecified osteoarthritis, unspecified site: Secondary | ICD-10-CM

## 2011-06-12 MED ORDER — CYCLOBENZAPRINE HCL 10 MG PO TABS: 10.0000 mg | ORAL_TABLET | Freq: Every day | ORAL | Status: DC | PRN

## 2011-07-15 ENCOUNTER — Encounter: Payer: Self-pay | Admitting: Internal Medicine

## 2011-07-15 ENCOUNTER — Ambulatory Visit (INDEPENDENT_AMBULATORY_CARE_PROVIDER_SITE_OTHER): Payer: Self-pay | Admitting: Internal Medicine

## 2011-07-15 VITALS — BP 171/103 | HR 96 | Temp 97.9°F | Ht 63.0 in | Wt 155.2 lb

## 2011-07-15 DIAGNOSIS — R1011 Right upper quadrant pain: Secondary | ICD-10-CM

## 2011-07-15 DIAGNOSIS — I10 Essential (primary) hypertension: Secondary | ICD-10-CM

## 2011-07-15 DIAGNOSIS — E119 Type 2 diabetes mellitus without complications: Secondary | ICD-10-CM

## 2011-07-15 DIAGNOSIS — Z79899 Other long term (current) drug therapy: Secondary | ICD-10-CM

## 2011-07-15 DIAGNOSIS — Z0000 Encounter for general adult medical examination without abnormal findings: Secondary | ICD-10-CM

## 2011-07-15 DIAGNOSIS — G4700 Insomnia, unspecified: Secondary | ICD-10-CM

## 2011-07-15 DIAGNOSIS — E785 Hyperlipidemia, unspecified: Secondary | ICD-10-CM

## 2011-07-15 LAB — CBC WITH DIFFERENTIAL/PLATELET
Eosinophils Absolute: 0 10*3/uL (ref 0.0–0.7)
Eosinophils Relative: 1 % (ref 0–5)
HCT: 41.8 % (ref 36.0–46.0)
Hemoglobin: 13.9 g/dL (ref 12.0–15.0)
Lymphocytes Relative: 24 % (ref 12–46)
Lymphs Abs: 0.9 10*3/uL (ref 0.7–4.0)
MCV: 86.7 fL (ref 78.0–100.0)
Monocytes Absolute: 0.2 10*3/uL (ref 0.1–1.0)
Neutro Abs: 2.6 10*3/uL (ref 1.7–7.7)
Platelets: 249 10*3/uL (ref 150–400)
RBC: 4.82 MIL/uL (ref 3.87–5.11)
WBC: 3.8 10*3/uL — ABNORMAL LOW (ref 4.0–10.5)

## 2011-07-15 LAB — POCT GLYCOSYLATED HEMOGLOBIN (HGB A1C): Hemoglobin A1C: 9.4

## 2011-07-15 MED ORDER — OMEPRAZOLE 20 MG PO CPDR: 20.0000 mg | DELAYED_RELEASE_CAPSULE | Freq: Every day | ORAL | Status: DC

## 2011-07-15 MED ORDER — ZOLPIDEM TARTRATE 10 MG PO TABS: 10.0000 mg | ORAL_TABLET | Freq: Every evening | ORAL | Status: DC | PRN

## 2011-07-15 MED ORDER — SITAGLIPTIN PHOSPHATE 100 MG PO TABS: 100.0000 mg | ORAL_TABLET | Freq: Every day | ORAL | Status: DC

## 2011-07-15 MED ORDER — LISINOPRIL 40 MG PO TABS: 40.0000 mg | ORAL_TABLET | Freq: Every day | ORAL | Status: DC

## 2011-07-15 MED ORDER — GLIMEPIRIDE 4 MG PO TABS: 4.0000 mg | ORAL_TABLET | Freq: Two times a day (BID) | ORAL | Status: DC

## 2011-07-15 MED ORDER — AMLODIPINE BESYLATE 5 MG PO TABS: 5.0000 mg | ORAL_TABLET | Freq: Every day | ORAL | Status: DC

## 2011-07-15 NOTE — Patient Instructions (Signed)
Please schedule a follow up appointment in 2 weeks . Please bring your medication bottles with your next appointment. Please take your medicines as prescribed. I will call you with your lab results if anything will be abnormal. 

## 2011-07-15 NOTE — Progress Notes (Signed)
  Subjective:    Patient ID: Tiffany [name], female    DOB: 1950/06/07, 61 y.o.   MRN: [id number]  HPI:61 y/o man with  the past medical history significant for hypertension, diabetes, hyperlipidemia he is a new patient to our clinic.  She was previously  seen at the family practice clinic but now wants to establish her care with the Internal medicine clinic.  1)Diabetes: She has not been checking her blood sugars. She states that she was not able to tolerate metformin when it was tried in the past- caused her to feel sick on her stomach. She states that she took steroids a wile ago and was also given a steroid shot in her knee about a month ago and since then she has been having problems controlling her blood sugars.She was also offered to be started on insulin but she is scared of shots and therefore refused that.  2)HTN: She was supposed to be on 2 antihypertensives-amlodipine and lisinopril but she is just taking one of those. Her dose of amlodipine was reduced when she was started on Zocor but she misinterpreted that and completely stopped amlodipine. Anyhow, She also stopped taking Zocor because of myalgias.  3) Right upper quadrant pain: She reports having right upper quadrant pain radiating to the back for last 2-3 weeks. She describes it as dull intermittent discomfort, currently rates her pain 6/10 . She denies any worsening of pain with eating or alteration in bowel habits.    Review of Systems  Constitutional: Negative for fever, chills and fatigue.  Eyes: Negative for visual disturbance.  Respiratory: Negative for choking, shortness of breath, wheezing and stridor.   Cardiovascular: Negative for chest pain, palpitations and leg swelling.  Gastrointestinal: Positive for abdominal pain. Negative for diarrhea and constipation.  Genitourinary: Negative for dysuria and frequency.  Musculoskeletal: Negative for arthralgias.  Neurological: Negative for dizziness and facial asymmetry.        Objective:   Physical Exam  Constitutional: She is oriented to person, place, and time. She appears well-developed and well-nourished. No distress.  HENT:  Head: Normocephalic and atraumatic.  Mouth/Throat: No oropharyngeal exudate.  Eyes: Conjunctivae and EOM are normal. Pupils are equal, round, and reactive to light.  Neck: Normal range of motion. Neck supple. No JVD present. No tracheal deviation present. No thyromegaly present.  Cardiovascular: Normal rate, regular rhythm, normal heart sounds and intact distal pulses.  Exam reveals no gallop and no friction rub.   No murmur heard. Pulmonary/Chest: Effort normal and breath sounds normal. No stridor. No respiratory distress. She has no wheezes. She has no rales. She exhibits no tenderness.  Abdominal: Soft. Bowel sounds are normal. She exhibits no distension. There is no tenderness. There is no rebound and no guarding.  Musculoskeletal: Normal range of motion.  Lymphadenopathy:    She has no cervical adenopathy.  Neurological: She is alert and oriented to person, place, and time. She has normal reflexes. She displays normal reflexes. No cranial nerve deficit. Coordination normal.  Skin: She is not diaphoretic.          Assessment & Plan:

## 2011-07-16 DIAGNOSIS — R1011 Right upper quadrant pain: Secondary | ICD-10-CM | POA: Insufficient documentation

## 2011-07-16 LAB — COMPLETE METABOLIC PANEL WITH GFR
AST: 14 U/L (ref 0–37)
Alkaline Phosphatase: 74 U/L (ref 39–117)
BUN: 9 mg/dL (ref 6–23)
CO2: 25 mEq/L (ref 19–32)
Calcium: 9.3 mg/dL (ref 8.4–10.5)
Chloride: 100 mEq/L (ref 96–112)
Creat: 0.62 mg/dL (ref 0.50–1.10)
GFR, Est African American: >89 mL/min
GFR, Est Non African American: >89 mL/min
Glucose, Bld: 174 mg/dL — ABNORMAL HIGH (ref 70–99)
Total Bilirubin: 0.4 mg/dL (ref 0.3–1.2)

## 2011-07-16 NOTE — Progress Notes (Signed)
Addended by: Bufford Spikes on: 07/16/2011 08:55 AM   Modules accepted: Orders

## 2011-07-16 NOTE — Assessment & Plan Note (Signed)
Lab Results  Component Value Date   HGBA1C 9.4 07/15/2011   HGBA1C 6.9 11/22/2009   CREATININE 0.62 07/15/2011   CREATININE 0.70 08/02/2010   CHOL 232* 10/08/2010   HDL 52 10/08/2010   TRIG 74 10/08/2010    Last eye exam and foot exam:  No results found for this basename: HMDIABEYEEXA, HMDIABFOOTEX    Assessment:  Diabetes control: not controlled Progress toward goals: deteriorated Barriers to meeting goals: lack of understanding of disease management  Plan: Diabetes treatment: Will start her on Januvia in addition to Amaryl. She was offered to be started on insulin but she refused.                                   Follow up in 2 weeks with CBG readings. Refer to: none Instruction/counseling given: reminded to get eye exam, reminded to bring blood glucose meter & log to each visit, reminded to bring medications to each visit, discussed foot care, discussed the need for weight loss and discussed diet

## 2011-07-16 NOTE — Assessment & Plan Note (Addendum)
Reports having intermittent right upper quadrant abdominal pain radiating to her back for last 2 weeks. Differentials include cholecystitis versus GERD versus pancreatitis versus peptic ulcer disease. Her last ultrasound in 2009 was reviewed that showed no gallstones. - Check  CBC, CMP , lipase and H. Pylori antigen. - Empirically treat her with PPI for GERD.

## 2011-07-16 NOTE — Assessment & Plan Note (Signed)
Check lipid panel and CMET. 

## 2011-07-16 NOTE — Assessment & Plan Note (Signed)
Lab Results  Component Value Date   NA 143 07/15/2011   K 3.6 07/15/2011   CL 100 07/15/2011   CO2 25 07/15/2011   BUN 9 07/15/2011   CREATININE 0.62 07/15/2011   CREATININE 0.70 08/02/2010    BP Readings from Last 3 Encounters:  07/15/11 171/103  01/30/11 160/94  01/09/11 170/98    Assessment: Hypertension control:  moderately elevated  Progress toward goals:  deteriorated Barriers to meeting goals:  lack of understanding of disease management  Plan: Hypertension treatment:  She was advised to take amlodipine with her lisinopril.

## 2011-07-17 LAB — H. PYLORI ANTIBODY, IGG: H Pylori IgG: >8 {ISR} — ABNORMAL HIGH

## 2011-07-18 ENCOUNTER — Other Ambulatory Visit (HOSPITAL_COMMUNITY): Payer: Self-pay | Admitting: Orthopedic Surgery

## 2011-07-18 DIAGNOSIS — M25561 Pain in right knee: Secondary | ICD-10-CM

## 2011-07-18 DIAGNOSIS — M25562 Pain in left knee: Secondary | ICD-10-CM

## 2011-07-24 ENCOUNTER — Ambulatory Visit (HOSPITAL_COMMUNITY)
Admission: RE | Admit: 2011-07-24 | Discharge: 2011-07-24 | Disposition: A | Discharge status: Home / Self Care | Payer: Self-pay | Source: Ambulatory Visit | Attending: Orthopedic Surgery | Admitting: Orthopedic Surgery

## 2011-07-24 DIAGNOSIS — M23329 Other meniscus derangements, posterior horn of medial meniscus, unspecified knee: Secondary | ICD-10-CM | POA: Insufficient documentation

## 2011-07-24 DIAGNOSIS — M25561 Pain in right knee: Secondary | ICD-10-CM

## 2011-07-24 DIAGNOSIS — M2350 Chronic instability of knee, unspecified knee: Secondary | ICD-10-CM | POA: Insufficient documentation

## 2011-07-24 DIAGNOSIS — M25569 Pain in unspecified knee: Secondary | ICD-10-CM | POA: Insufficient documentation

## 2011-07-24 DIAGNOSIS — M25562 Pain in left knee: Secondary | ICD-10-CM

## 2011-07-29 ENCOUNTER — Encounter: Payer: Self-pay | Admitting: Internal Medicine

## 2011-08-05 ENCOUNTER — Ambulatory Visit (INDEPENDENT_AMBULATORY_CARE_PROVIDER_SITE_OTHER): Payer: Self-pay | Admitting: Internal Medicine

## 2011-08-05 ENCOUNTER — Encounter: Payer: Self-pay | Admitting: Internal Medicine

## 2011-08-05 VITALS — BP 155/97 | HR 92 | Temp 97.3°F | Ht 63.0 in | Wt 155.6 lb

## 2011-08-05 DIAGNOSIS — M549 Dorsalgia, unspecified: Secondary | ICD-10-CM

## 2011-08-05 DIAGNOSIS — R1011 Right upper quadrant pain: Secondary | ICD-10-CM

## 2011-08-05 DIAGNOSIS — E785 Hyperlipidemia, unspecified: Secondary | ICD-10-CM

## 2011-08-05 DIAGNOSIS — E119 Type 2 diabetes mellitus without complications: Secondary | ICD-10-CM

## 2011-08-05 DIAGNOSIS — M1990 Unspecified osteoarthritis, unspecified site: Secondary | ICD-10-CM

## 2011-08-05 DIAGNOSIS — I10 Essential (primary) hypertension: Secondary | ICD-10-CM

## 2011-08-05 LAB — GLUCOSE, CAPILLARY: Glucose-Capillary: 87 mg/dL (ref 70–99)

## 2011-08-05 MED ORDER — SITAGLIPTIN PHOSPHATE 100 MG PO TABS: 100.0000 mg | ORAL_TABLET | Freq: Every day | ORAL | Status: DC

## 2011-08-05 MED ORDER — ATORVASTATIN CALCIUM 10 MG PO TABS: 10.0000 mg | ORAL_TABLET | Freq: Every day | ORAL | Status: DC

## 2011-08-05 MED ORDER — METRONIDAZOLE 250 MG PO TABS: 250.0000 mg | ORAL_TABLET | Freq: Four times a day (QID) | ORAL | Status: AC

## 2011-08-05 MED ORDER — OMEPRAZOLE 20 MG PO CPDR: 20.0000 mg | DELAYED_RELEASE_CAPSULE | Freq: Two times a day (BID) | ORAL | Status: DC

## 2011-08-05 MED ORDER — BISMUTH SUBSALICYLATE 262 MG PO TABS: 525.0000 mg | ORAL_TABLET | ORAL | Status: DC

## 2011-08-05 MED ORDER — AMOXICILLIN 500 MG PO CAPS: ORAL_CAPSULE | ORAL | Status: DC

## 2011-08-05 NOTE — Patient Instructions (Signed)
Please schedule a follow up appointment in  1-2 months or sooner if needed. . Please bring your medication bottles with your next appointment. Please take your medicines as prescribed. I will call you with your lab results if anything will be abnormal.

## 2011-08-13 NOTE — Assessment & Plan Note (Signed)
Lab Results  Component Value Date   NA 143 07/15/2011   K 3.6 07/15/2011   CL 100 07/15/2011   CO2 25 07/15/2011   BUN 9 07/15/2011   CREATININE 0.62 07/15/2011   CREATININE 0.70 08/02/2010    BP Readings from Last 3 Encounters:  08/05/11 155/97  07/15/11 171/103  01/30/11 160/94    Assessment: Hypertension control:  mildly elevated  Progress toward goals:  improved Barriers to meeting goals:  no barriers identified  Plan: Hypertension treatment:  continue current medications

## 2011-08-13 NOTE — Progress Notes (Signed)
  Subjective:    Patient ID: Tiffany [name], female    DOB: 19-Oct-1950, 61 y.o.   MRN: [id number]  HPI: 61 year old woman the past medical history significant for diabetes, hypertension comes to the clinic for a followup visit.  1) Abdominal pain: Patient states that she continues to have pain in her right upper quadrant radiating to the back. She reports that that pain has been going on for last month or so when she came to the clinic here for the first time. She describes her pain as dull achy kind of intermittent discomfort, currently rates her pain 5/10. Denies any worsening of pain with eating, nausea or vomiting associated with it.  2) Back pain:Other than that she also complains of her separate pain  in her back located in the mid upper back region.  3) Diabetes: she has not been checking her blood sugars but states that she was able to tolerate Januvia that was started with her last clinic visit.    Review of Systems  Constitutional: Negative for diaphoresis and fatigue.  HENT: Negative for nosebleeds, congestion, rhinorrhea and postnasal drip.   Eyes: Negative for visual disturbance.  Respiratory: Negative for apnea, cough, choking, chest tightness and shortness of breath.   Cardiovascular: Negative for chest pain, palpitations and leg swelling.  Gastrointestinal: Positive for abdominal pain. Negative for nausea.  Genitourinary: Negative for dysuria, frequency and hematuria.  Musculoskeletal: Positive for back pain. Negative for arthralgias.  Neurological: Negative for dizziness, facial asymmetry, light-headedness and headaches.  Hematological: Negative for adenopathy.  Psychiatric/Behavioral: Negative for agitation.       Objective:   Physical Exam  Constitutional: She is oriented to person, place, and time. She appears well-developed and well-nourished. No distress.  HENT:  Head: Normocephalic and atraumatic.  Mouth/Throat: No oropharyngeal exudate.  Eyes: Conjunctivae and  EOM are normal. Pupils are equal, round, and reactive to light. Right eye exhibits no discharge. Left eye exhibits no discharge. No scleral icterus.  Neck: Normal range of motion. Neck supple. No JVD present. No tracheal deviation present. No thyromegaly present.  Cardiovascular: Normal rate, regular rhythm, normal heart sounds and intact distal pulses.  Exam reveals no gallop and no friction rub.   No murmur heard. Pulmonary/Chest: Effort normal and breath sounds normal. No stridor. No respiratory distress. She has no wheezes. She has no rales.  Abdominal: Soft. Bowel sounds are normal. She exhibits no distension. There is no tenderness. There is no rebound.  Musculoskeletal: Normal range of motion. She exhibits no edema and no tenderness.  Lymphadenopathy:    She has no cervical adenopathy.  Neurological: She is alert and oriented to person, place, and time. She has normal reflexes. No cranial nerve deficit. Coordination normal.  Skin: Skin is warm. She is not diaphoretic.          Assessment & Plan:

## 2011-08-14 NOTE — Assessment & Plan Note (Signed)
Medication compliance is questionable. Will recheck lipids in 3 months. - Continue Lipitor at current dose.

## 2011-08-14 NOTE — Assessment & Plan Note (Signed)
She complains of back pain with this visit which could be related to her DJD. -Symptomatic treatment with pain meds, cold or warm compresses. - refer her to PT/OT.

## 2011-08-14 NOTE — Assessment & Plan Note (Signed)
She has not been checking her blood sugars at home but her CBG was 87 with today's visit. She has tried metformin in the past but could not tolerate that, due to which she was started on Januvia in addition to Amaryl. - Continue current regimen. - Advised to check sugars three times daily. - Will refer her to our diabetes educator and coordinatorLupita Leash [name]).

## 2011-08-14 NOTE — Assessment & Plan Note (Signed)
Patient complains of right upper quadrant abdominal pain for almost 1 month. Her labs that were checked with last visit showed elevated titres of H.Pylori antibodies. Patient states that she has never been treated with triple therapy for her ulcer disease in the past. After discussing with Dr. Rogelia Boga, I would treat her for that.Other differentials include cholecystitis although her LFT's were WNL. Her last USG and CT abdomen  from 2009 were reviewed that showed a very small renal cyst but otherwise normal. In the setting of persistent complaint, would get a repeat USG.

## 2011-08-15 ENCOUNTER — Telehealth: Payer: Self-pay | Admitting: Dietician

## 2011-08-15 DIAGNOSIS — E119 Type 2 diabetes mellitus without complications: Secondary | ICD-10-CM

## 2011-08-15 MED ORDER — LANCETS 30G MISC: Status: DC

## 2011-08-15 MED ORDER — BLOOD GLUCOSE MONITORING SUPPL DEVI: Status: DC

## 2011-08-15 MED ORDER — GLUCOSE BLOOD VI STRP: ORAL_STRIP | Status: DC

## 2011-08-15 NOTE — Telephone Encounter (Signed)
Meter and supplies called in to pharmacy per request of DR. Sawhney. Patient also called and notified to pick it up and call CDE to acknowledge order and schedule DSMT.

## 2011-08-19 ENCOUNTER — Other Ambulatory Visit: Payer: Self-pay | Admitting: *Deleted

## 2011-08-19 DIAGNOSIS — I10 Essential (primary) hypertension: Secondary | ICD-10-CM

## 2011-08-19 MED ORDER — AMLODIPINE BESYLATE 5 MG PO TABS: 5.0000 mg | ORAL_TABLET | Freq: Every day | ORAL | Status: DC

## 2011-08-19 NOTE — Telephone Encounter (Signed)
Rx faxed to pharmacy - Dr Dorthula Rue did approve #[id number].

## 2011-08-19 NOTE — Telephone Encounter (Signed)
Needs an order for pt to rec'd #90 instead #60 - tablets free through patient assistance at [facility] pharmacy.

## 2011-09-03 ENCOUNTER — Encounter: Payer: Self-pay | Admitting: Internal Medicine

## 2011-09-03 ENCOUNTER — Ambulatory Visit (INDEPENDENT_AMBULATORY_CARE_PROVIDER_SITE_OTHER): Payer: Self-pay | Admitting: Internal Medicine

## 2011-09-03 VITALS — BP 166/106 | HR 96 | Temp 97.2°F | Ht 63.0 in | Wt 155.6 lb

## 2011-09-03 DIAGNOSIS — M1990 Unspecified osteoarthritis, unspecified site: Secondary | ICD-10-CM

## 2011-09-03 DIAGNOSIS — E119 Type 2 diabetes mellitus without complications: Secondary | ICD-10-CM

## 2011-09-03 DIAGNOSIS — M479 Spondylosis, unspecified: Secondary | ICD-10-CM

## 2011-09-03 LAB — GLUCOSE, CAPILLARY: Glucose-Capillary: 164 mg/dL — ABNORMAL HIGH (ref 70–99)

## 2011-09-03 MED ORDER — GABAPENTIN 300 MG PO CAPS: 300.0000 mg | ORAL_CAPSULE | Freq: Three times a day (TID) | ORAL | Status: DC

## 2011-09-03 NOTE — Progress Notes (Signed)
Patient ID: Tiffany [name], female   DOB: 10-Dec-1950, 61 y.o.   MRN: [id number] 61 Y/o w with pmh listed below comes for upper back pain Ongoing for 3 months Episodic, sharp, associated with physical exertion like lifting wts, house work etc Chronic dull low back pain Occasional numbness in thighs, no tingling in hands NEck pain as well occasionally.  Tylenol and advil helps but not much  Physical exam  General Appearance:     Filed Vitals:   09/03/11 1445  BP: 166/106  Pulse: 96  Temp: 97.2 F (36.2 C)  TempSrc: Oral  Height: 5\' 3"  (1.6 m)  Weight: 155 lb 9.6 oz (70.58 kg)  SpO2: 100%     Alert, cooperative, no distress, appears stated age  Head:    Normocephalic, without obvious abnormality, atraumatic  Eyes:    PERRL, conjunctiva/corneas clear, EOM's intact, fundi    benign, both eyes       Neck:   Supple, symmetrical, trachea midline, no adenopathy;       thyroid:  No enlargement/tenderness/nodules; no carotid   bruit or JVD  Lungs:     Clear to auscultation bilaterally, respirations unlabored  Chest wall:    No tenderness or deformity  Heart:    Regular rate and rhythm, S1 and S2 normal, no murmur, rub   or gallop  Abdomen:     Soft, non-tender, bowel sounds active all four quadrants,    no masses, no organomegaly  Extremities:   Extremities normal, atraumatic, no cyanosis or edema  Pulses:   2+ and symmetric all extremities  Skin:   Skin color, texture, turgor normal, no rashes or lesions  Neurologic:  nonfocal grossly    ROS As per HPI

## 2011-09-03 NOTE — Assessment & Plan Note (Signed)
Upper back pain for 3 month Chronic lower back pain MRI 2008 showed sever DJD of lumbar spine with mild cord compression and bone spurs Xray of lumbar and cervical spine in 2009 showed spondylosis  Was seen by ortho (Dr. Farris Has) before but no surgery at that time because of uncontrolled DM, HTN  Plan - neurontin. Was on this before but unsure if optimum dose achieved.  - Continue flexeril, advil and tyenol - heating pads - Sports med referral for benefit of steroid injectio and need for ortho eval   - Follow up with pcp in 1 month to decide on chronic narcotic Mx based on sports med evaluation  (patient was on vicodin before and was able to function well, no seeking behavior)

## 2011-09-03 NOTE — Patient Instructions (Signed)

## 2011-09-12 ENCOUNTER — Telehealth: Payer: Self-pay | Admitting: *Deleted

## 2011-09-12 DIAGNOSIS — I10 Essential (primary) hypertension: Secondary | ICD-10-CM

## 2011-09-12 NOTE — Telephone Encounter (Signed)
Fax from [facility] -  States they can provide a free 90- day supply of Accupril 40mg  qd  If you consider changing from Lisinopril 40mg  qd. Need new rx.  Thanks

## 2011-09-15 MED ORDER — QUINAPRIL HCL 40 MG PO TABS: 40.0000 mg | ORAL_TABLET | Freq: Every day | ORAL | Status: DC

## 2011-09-15 NOTE — Telephone Encounter (Signed)
I did change the medication.  Thanks, [employer]

## 2011-09-17 ENCOUNTER — Encounter: Payer: Self-pay | Admitting: Dietician

## 2011-09-19 ENCOUNTER — Ambulatory Visit (INDEPENDENT_AMBULATORY_CARE_PROVIDER_SITE_OTHER): Payer: Self-pay | Admitting: Family Medicine

## 2011-09-19 ENCOUNTER — Ambulatory Visit (HOSPITAL_COMMUNITY)
Admission: RE | Admit: 2011-09-19 | Discharge: 2011-09-19 | Disposition: A | Discharge status: Home / Self Care | Payer: Self-pay | Source: Ambulatory Visit | Attending: Family Medicine | Admitting: Family Medicine

## 2011-09-19 VITALS — BP 150/99

## 2011-09-19 DIAGNOSIS — IMO0002 Reserved for concepts with insufficient information to code with codable children: Secondary | ICD-10-CM

## 2011-09-19 DIAGNOSIS — M545 Low back pain, unspecified: Secondary | ICD-10-CM | POA: Insufficient documentation

## 2011-09-19 DIAGNOSIS — M48061 Spinal stenosis, lumbar region without neurogenic claudication: Secondary | ICD-10-CM

## 2011-09-19 DIAGNOSIS — M5137 Other intervertebral disc degeneration, lumbosacral region: Secondary | ICD-10-CM

## 2011-09-19 DIAGNOSIS — M5136 Other intervertebral disc degeneration, lumbar region: Secondary | ICD-10-CM

## 2011-09-19 DIAGNOSIS — M5416 Radiculopathy, lumbar region: Secondary | ICD-10-CM

## 2011-09-19 DIAGNOSIS — G8929 Other chronic pain: Secondary | ICD-10-CM

## 2011-09-19 MED ORDER — AMITRIPTYLINE HCL 25 MG PO TABS: 25.0000 mg | ORAL_TABLET | Freq: Every day | ORAL | Status: DC

## 2011-09-19 MED ORDER — FENTANYL 25 MCG/HR TD PT72: 1.0000 | MEDICATED_PATCH | TRANSDERMAL | Status: DC

## 2011-09-19 NOTE — Patient Instructions (Signed)
You have been scheduled for an appointment for MRI on 09/23/11 at 8 am, please arrive at radiology on the 1st floor of [facility] at 7:45 am.

## 2011-09-19 NOTE — Progress Notes (Signed)
Subjective:    Patient ID: Tiffany [name], female    DOB: Jan 09, 1951, 61 y.o.   MRN: [id number]  HPI  Tiffany [name] is a pleasant 51 years "come in today complaining of chronic low back pain. She had a laminectomy back in 1989 that's around the time that her low back pain started, she has had at least 3 different MRIs or lumbar spine the last one was done in 2009 shows, multilevel DDD, there is a herniated disc at the level L2-L3 and another one at L5 S1, spinal canal stenosis and facet arthropathy. State that the low back pain a sharp, radiated to both legs especially to the left one. She denies any numbness or tingling. She she complains of decreased sensation on the left lateral thigh, he states there is mild winging especially in the left leg, she has been treated in the past with pain medications, Neurontin, physical therapy, which had not work. She described the pain as a sharp pain, 6/10 in intensity, worsen with activity, constant pain. She saw Dr. Karsten Ro neurosurgeon in the past is considering surgical treatment, but that was left on hold.   Patient Active Problem List  Diagnoses  . DIABETES MELLITUS  . HYPERLIPIDEMIA  . HYPERTENSION  . ALLERGIC RHINITIS  . DEGENERATIVE JOINT DISEASE  . LATERAL EPICONDYLITIS  . SLEEP APNEA  . Insomnia  . Abdominal pain, right upper quadrant    Current Outpatient Prescriptions on File Prior to Visit  Medication Sig Dispense Refill  . amLODipine (NORVASC) 5 MG tablet Take 1 tablet (5 mg total) by mouth daily.  30 tablet  1  . atorvastatin (LIPITOR) 10 MG tablet Take 1 tablet (10 mg total) by mouth daily.  30 tablet  3  . Bismuth Subsalicylate 262 MG TABS Take 2.0038 tablets (525 mg total) by mouth 4 (four) times a week. Tak  56 each  0  . Blood Glucose Monitoring Suppl DEVI Use to check blood sugar as directed up to 3 times a day  1 each  0  . cyclobenzaprine (FLEXERIL) 10 MG tablet Take 1 tablet (10 mg total) by mouth daily as needed for muscle  spasms. 1 as needed for severe muscle pain  30 tablet  3  . fluticasone (FLONASE) 50 MCG/ACT nasal spray Place 2 sprays into the nose daily.  16 g  11  . gabapentin (NEURONTIN) 300 MG capsule Take 1 capsule (300 mg total) by mouth 3 (three) times daily.  90 capsule  2  . glimepiride (AMARYL) 4 MG tablet Take 1 tablet (4 mg total) by mouth 2 (two) times daily.  60 tablet  11  . glucose blood test strip Use to check blood sugar as directed up to 3 times a day  100 each  12  . Lancets 30G MISC Use to check blood sugar as directed up to 3 times a day  100 each  5  . omeprazole (PRILOSEC) 20 MG capsule Take 1 capsule (20 mg total) by mouth 2 (two) times daily.  14 capsule  0  . quinapril (ACCUPRIL) 40 MG tablet Take 1 tablet (40 mg total) by mouth at bedtime.  90 tablet  3  . sitaGLIPtin (JANUVIA) 100 MG tablet Take 1 tablet (100 mg total) by mouth daily.  30 tablet  3  . zolpidem (AMBIEN) 10 MG tablet Take 1 tablet (10 mg total) by mouth at bedtime as needed. No more than twice a week  30 tablet  0   Allergies  Allergen  Reactions  . Iodine Nausea And Vomiting  . Aspirin   . Penicillins       Review of Systems  Constitutional: Negative for fever, chills, diaphoresis and fatigue.  Neurological: Negative for weakness and numbness.       Objective:   Physical Exam  Constitutional: She is oriented to person, place, and time. She appears well-developed and well-nourished.       BP 150/99   Pulmonary/Chest: Effort normal.  Musculoskeletal:       Low back with intact skin. No swelling, no hematomas. Decrease ROM for flexion, extension, rotation and lateralization. Pain w/extension and rotation. Tenderness to palpation on SI joint area B/L. Faber test positive for SI joint pain. Straight leg positive in the left at 30 degrees. Strength 5/5 for hip flexion and extension, 5/5 for knee flexion and extension, 5/5 for ankle plantar and dorsal flexion on the right,3/5 for hip flexion and  extension, 3/5 for knee flexion and extension, 3/5 for ankle plantar and dorsal flexion on the left . DTR patellar and achilles II/IV B/L Sensation intact distally B/L. No leg discrepancy.   Neurological: She is alert and oriented to person, place, and time.  Skin: Skin is warm. No rash noted. No erythema.  Psychiatric: She has a normal mood and affect. Her behavior is normal. Thought content normal.          Assessment & Plan:   1. Chronic radicular low back pain  DG Lumbar Spine Complete, MR Lumbar Spine Wo Contrast  2. DDD (degenerative disc disease), lumbar  DG Lumbar Spine Complete, MR Lumbar Spine Wo Contrast   Elavil 25 mg at bedtime Duragesic patch 25 mg. One patch every 72 hours for pain control  X ray showed anterolisthesis L4-L5  DDD, L4-L5, L5-S1. We have discussed with Tiffany [name] benefits of surgical treatment with her severity of her lumbar stenoses, left leg weakness, and easy of her symptoms. We'll order a MRI of her lumbar spine we will refer for surgical evaluation either at [facility] ortho residency program or [facility].  We will call her back with MRI results.

## 2011-09-23 ENCOUNTER — Encounter: Payer: Self-pay | Admitting: Internal Medicine

## 2011-09-23 ENCOUNTER — Ambulatory Visit (HOSPITAL_COMMUNITY)
Admission: RE | Admit: 2011-09-23 | Discharge: 2011-09-23 | Disposition: A | Discharge status: Home / Self Care | Payer: Self-pay | Source: Ambulatory Visit | Attending: Family Medicine | Admitting: Family Medicine

## 2011-09-23 ENCOUNTER — Ambulatory Visit (INDEPENDENT_AMBULATORY_CARE_PROVIDER_SITE_OTHER): Payer: Self-pay | Admitting: Internal Medicine

## 2011-09-23 VITALS — BP 143/84 | HR 90 | Temp 98.3°F | Ht 62.5 in | Wt 155.2 lb

## 2011-09-23 DIAGNOSIS — E119 Type 2 diabetes mellitus without complications: Secondary | ICD-10-CM

## 2011-09-23 DIAGNOSIS — M47817 Spondylosis without myelopathy or radiculopathy, lumbosacral region: Secondary | ICD-10-CM | POA: Insufficient documentation

## 2011-09-23 DIAGNOSIS — M1990 Unspecified osteoarthritis, unspecified site: Secondary | ICD-10-CM

## 2011-09-23 DIAGNOSIS — E785 Hyperlipidemia, unspecified: Secondary | ICD-10-CM

## 2011-09-23 DIAGNOSIS — M549 Dorsalgia, unspecified: Secondary | ICD-10-CM

## 2011-09-23 DIAGNOSIS — I10 Essential (primary) hypertension: Secondary | ICD-10-CM

## 2011-09-23 LAB — LIPID PANEL
Cholesterol: 207 mg/dL — ABNORMAL HIGH (ref 0–200)
LDL Cholesterol: 143 mg/dL — ABNORMAL HIGH (ref 0–99)
Total CHOL/HDL Ratio: 4.2 Ratio
Triglycerides: 75 mg/dL (ref <150)
VLDL: 15 mg/dL (ref 0–40)

## 2011-09-23 MED ORDER — HYDROCODONE-ACETAMINOPHEN 5-500 MG PO TABS: 1.0000 | ORAL_TABLET | Freq: Four times a day (QID) | ORAL | Status: AC | PRN

## 2011-09-23 NOTE — Patient Instructions (Signed)
Please schedule a follow up appointment in 1-2 months . Please bring your medication bottles with your next appointment. Please take your medicines as prescribed. I will call you with your lab results if anything will be abnormal. 

## 2011-09-23 NOTE — Progress Notes (Signed)
  Subjective:    Patient ID: Tiffany [name], female    DOB: 08-28-1950, 61 y.o.   MRN: [id number]  HPI: 61 year old woman with past medical history significant for type 2 diabetes, back pain, hypertension comes to the clinic for a followup visit.  1.Back pain: Patient was recently seen in the clinic about 2-3 weeks ago for worsening back pain for which she was referred to sports medicine who ordered a repeat imaging including x- rays and  MRI ( that was done today). She continues to complain of some back pain at today's visit and states that she was given a prescription for Duragesic patch and neurontin by the physician but she did not have money to buy the medication.  2. Diabetes: Patient states that her blood sugars have been under better control since she has been started on Januvia. Denies any hypoglycemic events. Her CBG log was reviewed that showed her blood sugars to be running in 130 's - 160's.     Review of Systems  HENT: Negative for congestion, rhinorrhea, sneezing and postnasal drip.   Respiratory: Negative for cough, choking, chest tightness and wheezing.   Cardiovascular: Negative for chest pain, palpitations and leg swelling.  Gastrointestinal: Negative for nausea, vomiting, abdominal pain and constipation.  Genitourinary: Negative for dysuria, urgency, frequency and flank pain.  Musculoskeletal: Positive for back pain.  Neurological: Negative for facial asymmetry and numbness.  Hematological: Negative for adenopathy.       Objective:   Physical Exam  Constitutional: She is oriented to person, place, and time. She appears well-developed and well-nourished. No distress.  HENT:  Head: Normocephalic and atraumatic.  Mouth/Throat: No oropharyngeal exudate.  Eyes: Conjunctivae and EOM are normal. Pupils are equal, round, and reactive to light.  Neck: Normal range of motion. Neck supple. No JVD present. No tracheal deviation present. No thyromegaly present.  Cardiovascular:  Normal rate, regular rhythm, normal heart sounds and intact distal pulses.  Exam reveals no gallop and no friction rub.   No murmur heard. Pulmonary/Chest: Effort normal and breath sounds normal. No stridor. No respiratory distress. She has no wheezes. She has no rales.  Abdominal: Soft. Bowel sounds are normal. She exhibits no distension. There is no tenderness.  Musculoskeletal: Normal range of motion. She exhibits no edema and no tenderness.       SLRT was positive bilaterally  Lymphadenopathy:    She has no cervical adenopathy.  Neurological: She is alert and oriented to person, place, and time. She has normal reflexes. She displays normal reflexes. No cranial nerve deficit. Coordination normal.  Skin: Skin is warm. She is not diaphoretic.          Assessment & Plan:

## 2011-09-26 ENCOUNTER — Other Ambulatory Visit: Payer: Self-pay | Admitting: Family Medicine

## 2011-09-26 ENCOUNTER — Telehealth: Payer: Self-pay | Admitting: *Deleted

## 2011-09-26 ENCOUNTER — Encounter: Payer: Self-pay | Admitting: Dietician

## 2011-09-26 ENCOUNTER — Telehealth (INDEPENDENT_AMBULATORY_CARE_PROVIDER_SITE_OTHER): Payer: Self-pay | Admitting: *Deleted

## 2011-09-26 DIAGNOSIS — M5137 Other intervertebral disc degeneration, lumbosacral region: Secondary | ICD-10-CM

## 2011-09-26 DIAGNOSIS — M5416 Radiculopathy, lumbar region: Secondary | ICD-10-CM

## 2011-09-26 DIAGNOSIS — M47817 Spondylosis without myelopathy or radiculopathy, lumbosacral region: Secondary | ICD-10-CM

## 2011-09-26 DIAGNOSIS — M5136 Other intervertebral disc degeneration, lumbar region: Secondary | ICD-10-CM

## 2011-09-26 DIAGNOSIS — M47816 Spondylosis without myelopathy or radiculopathy, lumbar region: Secondary | ICD-10-CM

## 2011-09-26 DIAGNOSIS — IMO0002 Reserved for concepts with insufficient information to code with codable children: Secondary | ICD-10-CM

## 2011-09-26 DIAGNOSIS — M5410 Radiculopathy, site unspecified: Secondary | ICD-10-CM

## 2011-09-26 NOTE — Telephone Encounter (Signed)
Scheduled pt for appt at [facility] 10/01/11 @ 12:15pm for facet joint inj per Dr. Ashley Jacobs.  Pt notified of appt info.

## 2011-09-26 NOTE — Telephone Encounter (Signed)
I spoke with Tiffany [name] regarding her L spine MRI result. We discussed her options being ESI which she had in the past or seen a orthopaedic program for surgical evaluation.  By: Dr. Ashley Jacobs

## 2011-09-29 NOTE — Assessment & Plan Note (Signed)
Lab Results  Component Value Date   NA 143 07/15/2011   K 3.6 07/15/2011   CL 100 07/15/2011   CO2 25 07/15/2011   BUN 9 07/15/2011   CREATININE 0.62 07/15/2011   CREATININE 0.70 08/02/2010    BP Readings from Last 3 Encounters:  09/23/11 143/84  09/19/11 150/99  09/03/11 166/106    Assessment: Hypertension control:  controlled  Progress toward goals:  improved Barriers to meeting goals:  no barriers identified  Plan: Hypertension treatment:  continue current medications

## 2011-09-29 NOTE — Assessment & Plan Note (Signed)
Lab Results  Component Value Date   HGBA1C 9.4 07/15/2011   HGBA1C 6.9 11/22/2009   CREATININE 0.62 07/15/2011   CREATININE 0.70 08/02/2010   CHOL 207* 09/23/2011   HDL 49 09/23/2011   TRIG 75 09/23/2011    Last eye exam and foot exam: No results found for this basename: HMDIABEYEEXA, HMDIABFOOTEX    Assessment: Her CBG log was reviewed and fasting blood sugars have been ranging in 160's. Diabetes control: controlled Progress toward goals: at goal Barriers to meeting goals: no barriers identified  Plan: Diabetes treatment: continue current medications Refer to: none Instruction/counseling given: reminded to get eye exam, reminded to bring blood glucose meter & log to each visit, reminded to bring medications to each visit, discussed foot care and discussed the need for weight loss

## 2011-09-29 NOTE — Assessment & Plan Note (Addendum)
Patient continues to complain of some back pain at today's visit. She was seen by Dr. Ashley Jacobs about 3-4 days prior to this clinic visit who  ordered a repeat MRI to assess the progression of her disease and further determine the treatment modalities( surgery vs conservative) and prescribed some pain medications to her. Patient states that Duragesic patch and neuron tin  are expensive for her and would be borrowing money from her family , so she was still in pain. -Will give her prescription for Vicodin in the meantime till she is able to afford her other pain meds.

## 2011-10-01 ENCOUNTER — Inpatient Hospital Stay: Admission: RE | Admit: 2011-10-01 | Payer: Self-pay | Source: Ambulatory Visit

## 2011-10-03 ENCOUNTER — Encounter: Payer: Self-pay | Admitting: Family Medicine

## 2011-10-03 ENCOUNTER — Ambulatory Visit (INDEPENDENT_AMBULATORY_CARE_PROVIDER_SITE_OTHER): Payer: Self-pay | Admitting: Family Medicine

## 2011-10-03 VITALS — BP 155/98 | HR 102

## 2011-10-03 DIAGNOSIS — M51369 Other intervertebral disc degeneration, lumbar region without mention of lumbar back pain or lower extremity pain: Secondary | ICD-10-CM

## 2011-10-03 DIAGNOSIS — M5126 Other intervertebral disc displacement, lumbar region: Secondary | ICD-10-CM

## 2011-10-03 DIAGNOSIS — M47817 Spondylosis without myelopathy or radiculopathy, lumbosacral region: Secondary | ICD-10-CM

## 2011-10-03 DIAGNOSIS — M5137 Other intervertebral disc degeneration, lumbosacral region: Secondary | ICD-10-CM

## 2011-10-03 DIAGNOSIS — M5136 Other intervertebral disc degeneration, lumbar region: Secondary | ICD-10-CM

## 2011-10-03 DIAGNOSIS — M47816 Spondylosis without myelopathy or radiculopathy, lumbar region: Secondary | ICD-10-CM

## 2011-10-03 NOTE — Progress Notes (Signed)
Subjective:    Patient ID: Tiffany [name], female    DOB: [birth year], 61 y.o.   MRN: [id number]  HPI  Tiffany [name] is a pleasant 61 years of patient went today to followup on her low back pain after her lumbar MRI was done, we talked last week on the phone about the MRI results and discuss her options. She wanted to come with her dad her today to be explained again about the MRI and her treatment options. She has been having chronic low back pain, she had a laminectomy back in the 90s. Her pain is severe, located in the low back, radiated on and off to both gluteal areas, no numbness or tingling. The lumbar MRI shows diffuse DJD more prominent at L1-L2, L4-L5,L5-S1. There is also facet arthropathy at L4-L5. She was scheduled to have ESI on her has had L4-L5 in lumbar spine 2 days ago, she canceled her appointment and status she would like to reschedule the injection. I have discussed with her and her daughter and extent to the etiology of her problem and her treatment options being 1) ESI and facet injection, 2)physical therapy after her symptoms improve with ESI if they do improve,( she did physiotherapy in the past with worsening of her symptoms) 3) surgical elevation to see if there is any surgical treatment option 4) pain clinic management.  Patient Active Problem List  Diagnoses  . DIABETES MELLITUS  . HYPERLIPIDEMIA  . HYPERTENSION  . ALLERGIC RHINITIS  . DEGENERATIVE JOINT DISEASE  . LATERAL EPICONDYLITIS  . SLEEP APNEA  . Insomnia  . Abdominal pain, right upper quadrant   Current Outpatient Prescriptions on File Prior to Visit  Medication Sig Dispense Refill  . amitriptyline (ELAVIL) 25 MG tablet Take 1 tablet (25 mg total) by mouth at bedtime.  30 tablet  0  . amLODipine (NORVASC) 5 MG tablet Take 1 tablet (5 mg total) by mouth daily.  30 tablet  1  . atorvastatin (LIPITOR) 10 MG tablet Take 1 tablet (10 mg total) by mouth daily.  30 tablet  3  . Bismuth Subsalicylate 262 MG TABS  Take 2.0038 tablets (525 mg total) by mouth 4 (four) times a week. Tak  56 each  0  . Blood Glucose Monitoring Suppl DEVI Use to check blood sugar as directed up to 3 times a day  1 each  0  . cyclobenzaprine (FLEXERIL) 10 MG tablet Take 1 tablet (10 mg total) by mouth daily as needed for muscle spasms. 1 as needed for severe muscle pain  30 tablet  3  . fentaNYL (DURAGESIC) 25 MCG/HR Place 1 patch (25 mcg total) onto the skin every 3 (three) days.  5 patch  0  . fluticasone (FLONASE) 50 MCG/ACT nasal spray Place 2 sprays into the nose daily.  16 g  11  . gabapentin (NEURONTIN) 300 MG capsule Take 1 capsule (300 mg total) by mouth 3 (three) times daily.  90 capsule  2  . glimepiride (AMARYL) 4 MG tablet Take 1 tablet (4 mg total) by mouth 2 (two) times daily.  60 tablet  11  . glucose blood test strip Use to check blood sugar as directed up to 3 times a day  100 each  12  . HYDROcodone-acetaminophen (VICODIN) 5-500 MG per tablet Take 1 tablet by mouth every 6 (six) hours as needed for pain.  30 tablet  0  . Lancets 30G MISC Use to check blood sugar as directed up to 3 times a  day  100 each  5  . omeprazole (PRILOSEC) 20 MG capsule Take 1 capsule (20 mg total) by mouth 2 (two) times daily.  14 capsule  0  . quinapril (ACCUPRIL) 40 MG tablet Take 1 tablet (40 mg total) by mouth at bedtime.  90 tablet  3  . sitaGLIPtin (JANUVIA) 100 MG tablet Take 1 tablet (100 mg total) by mouth daily.  30 tablet  3  . zolpidem (AMBIEN) 10 MG tablet Take 1 tablet (10 mg total) by mouth at bedtime as needed. No more than twice a week  30 tablet  0   Allergies  Allergen Reactions  . Iodine Nausea And Vomiting  . Aspirin   . Penicillins      Review of Systems  Constitutional: Negative for fever, chills, diaphoresis and fatigue.  Musculoskeletal: Positive for back pain. Negative for joint swelling, arthralgias and gait problem.  Neurological: Negative for weakness and numbness.       Objective:   Physical  Exam  Constitutional: She is oriented to person, place, and time. She appears well-developed and well-nourished.       BP 155/98  Pulse 102   Pulmonary/Chest: Effort normal.  Musculoskeletal:        Low back with intact skin. No swelling, no hematomas. Decrease ROM for flexion, extension, rotation and lateralization. Pain w/extension and rotation. Tenderness to palpation on SI joint area B/L. Faber test positive for SI joint pain. Straight leg positive in the left at 30 degrees. Strength 5/5 for hip flexion and extension, 5/5 for knee flexion and extension, 5/5 for ankle plantar and dorsal flexion on the right,3/5 for hip flexion and extension, 3/5 for knee flexion and extension, 3/5 for ankle plantar and dorsal flexion on the left . DTR patellar and achilles II/IV B/L Sensation intact distally B/L. No leg discrepancy.    Neurological: She is alert and oriented to person, place, and time.  Skin: Skin is warm. No rash noted. No erythema. No pallor.  Psychiatric: She has a normal mood and affect. Her behavior is normal.          Assessment & Plan:   1. DDD (degenerative disc disease), lumbar   2. Facet arthritis of lumbar region   3. Lumbar herniated disc    Continue elavil Continue Duragesic patch She will reschedule her ESI F/U in 4 week

## 2011-10-11 ENCOUNTER — Ambulatory Visit
Admission: RE | Admit: 2011-10-11 | Discharge: 2011-10-11 | Disposition: A | Discharge status: Home / Self Care | Payer: No Typology Code available for payment source | Source: Ambulatory Visit | Attending: Family Medicine | Admitting: Family Medicine

## 2011-10-11 ENCOUNTER — Other Ambulatory Visit: Payer: Self-pay | Admitting: Family Medicine

## 2011-10-11 DIAGNOSIS — M5410 Radiculopathy, site unspecified: Secondary | ICD-10-CM

## 2011-10-11 DIAGNOSIS — IMO0002 Reserved for concepts with insufficient information to code with codable children: Secondary | ICD-10-CM

## 2011-10-11 MED ORDER — METHYLPREDNISOLONE ACETATE 40 MG/ML INJ SUSP (RADIOLOG
120.0000 mg | Freq: Once | INTRAMUSCULAR | Status: AC
  Administered 2011-10-11: 120 mg via EPIDURAL

## 2011-10-11 MED ORDER — IOHEXOL 180 MG/ML  SOLN
1.0000 mL | Freq: Once | INTRAMUSCULAR | Status: AC | PRN
  Administered 2011-10-11: 1 mL via EPIDURAL

## 2011-10-11 NOTE — Discharge Instructions (Signed)

## 2011-11-04 ENCOUNTER — Encounter: Payer: Self-pay | Admitting: Internal Medicine

## 2011-11-04 ENCOUNTER — Ambulatory Visit (INDEPENDENT_AMBULATORY_CARE_PROVIDER_SITE_OTHER): Payer: Medicaid Other | Admitting: Internal Medicine

## 2011-11-04 ENCOUNTER — Other Ambulatory Visit: Payer: Self-pay | Admitting: Internal Medicine

## 2011-11-04 VITALS — BP 164/101 | HR 80 | Temp 97.9°F | Ht 62.0 in | Wt 152.2 lb

## 2011-11-04 DIAGNOSIS — I10 Essential (primary) hypertension: Secondary | ICD-10-CM

## 2011-11-04 DIAGNOSIS — R55 Syncope and collapse: Secondary | ICD-10-CM

## 2011-11-04 DIAGNOSIS — Z79899 Other long term (current) drug therapy: Secondary | ICD-10-CM

## 2011-11-04 DIAGNOSIS — E119 Type 2 diabetes mellitus without complications: Secondary | ICD-10-CM

## 2011-11-04 DIAGNOSIS — W19XXXA Unspecified fall, initial encounter: Secondary | ICD-10-CM

## 2011-11-04 LAB — POCT GLYCOSYLATED HEMOGLOBIN (HGB A1C): Hemoglobin A1C: 10.1

## 2011-11-04 MED ORDER — SITAGLIPTIN PHOSPHATE 100 MG PO TABS: 100.0000 mg | ORAL_TABLET | Freq: Every day | ORAL | Status: DC

## 2011-11-04 MED ORDER — AMLODIPINE BESYLATE 5 MG PO TABS: 5.0000 mg | ORAL_TABLET | Freq: Every day | ORAL | Status: DC

## 2011-11-04 MED ORDER — QUINAPRIL HCL 40 MG PO TABS: 40.0000 mg | ORAL_TABLET | Freq: Every day | ORAL | Status: DC

## 2011-11-04 NOTE — Patient Instructions (Signed)
Please schedule a follow up appointment in 1 month. Please bring your medication bottles with your next appointment. I will call you with your lab results if anything will be abnormal.

## 2011-11-05 LAB — COMPLETE METABOLIC PANEL WITH GFR
Albumin: 4.1 g/dL (ref 3.5–5.2)
Alkaline Phosphatase: 76 U/L (ref 39–117)
BUN: 15 mg/dL (ref 6–23)
Calcium: 9 mg/dL (ref 8.4–10.5)
GFR, Est African American: >89 mL/min
GFR, Est Non African American: >89 mL/min
Glucose, Bld: 267 mg/dL — ABNORMAL HIGH (ref 70–99)
Potassium: 4 mEq/L (ref 3.5–5.3)
Total Bilirubin: 0.3 mg/dL (ref 0.3–1.2)

## 2011-11-05 LAB — CBC WITH DIFFERENTIAL/PLATELET
Basophils Absolute: 0 10*3/uL (ref 0.0–0.1)
Eosinophils Absolute: 0 10*3/uL (ref 0.0–0.7)
HCT: 42.8 % (ref 39.0–52.0)
Lymphocytes Relative: 24 % (ref 12–46)
MCH: 28.9 pg (ref 26.0–34.0)
Monocytes Relative: 6 % (ref 3–12)
Neutro Abs: 3.2 10*3/uL (ref 1.7–7.7)
Neutrophils Relative %: 69 % (ref 43–77)
Platelets: 214 10*3/uL (ref 150–400)
RDW: 14.1 % (ref 11.5–15.5)
WBC: 4.7 10*3/uL (ref 4.0–10.5)

## 2011-11-06 ENCOUNTER — Telehealth: Payer: Self-pay | Admitting: *Deleted

## 2011-11-06 NOTE — Telephone Encounter (Signed)
Prior Authorization for Januvia  100 mg  tablets was approved 11/06/2011 thru 11/05/2012.  Angelina Ok, RN 11/06/2011 9:45 AM

## 2011-11-07 DIAGNOSIS — W19XXXA Unspecified fall, initial encounter: Secondary | ICD-10-CM | POA: Insufficient documentation

## 2011-11-07 NOTE — Assessment & Plan Note (Signed)
Lab Results  Component Value Date   HGBA1C 10.1 11/04/2011   HGBA1C 6.9 11/22/2009   CREATININE 0.61 11/04/2011   CREATININE 0.70 08/02/2010   CHOL 207* 09/23/2011   HDL 49 09/23/2011   TRIG 75 09/23/2011    Last eye exam and foot exam: No results found for this basename: HMDIABEYEEXA, HMDIABFOOTEX    Assessment: Diabetes control: not controlled Progress toward goals: deteriorated Barriers to meeting goals: lack of understanding of disease management  Plan: Diabetes treatment: continue current medications. She was educated on the importance of medication adherence. She was also educated that insulin is a better option for her good glycemic control but she does not want to try that. She states that she would try to be adherent to her her Januvia and Amaryl. Refer to: diabetes educator for self-management training, diabetes educator for medical nutrition therapy, nutritionist. Instruction/counseling given: reminded to get eye exam, reminded to bring blood glucose meter & log to each visit, reminded to bring medications to each visit, discussed foot care, discussed the need for weight loss and discussed diet

## 2011-11-07 NOTE — Assessment & Plan Note (Signed)
Lab Results  Component Value Date   NA 137 11/04/2011   K 4.0 11/04/2011   CL 102 11/04/2011   CO2 22 11/04/2011   BUN 15 11/04/2011   CREATININE 0.61 11/04/2011   CREATININE 0.70 08/02/2010    BP Readings from Last 3 Encounters:  11/04/11 164/101  10/11/11 162/94  10/03/11 155/98    Assessment: Hypertension control:  mildly elevated  Progress toward goals:  deteriorated Barriers to meeting goals:  nonadherence to medications  Plan: Hypertension treatment:  continue current medications. She was counseled on medication adherence and emphasized the importance of daily medication intake.

## 2011-11-07 NOTE — Progress Notes (Signed)
  Subjective:    Patient ID: Tiffany [name], female    DOB: [birth year], 61 y.o.   MRN: [id number]  HPI: 61 year old woman with past medical history significant for type 2 diabetes, hypertension, hyperlipidemia, medication noncompliance comes to the clinic for followup visit.  Patient was accompanied by her son and daughter who were also contributing to the history.  Patient states she fell about 2 days ago while she was moving from her kitchen to the dining area. She describes as feeling of sudden onset of weakness and then trying to sit on the chair but could not and therefore fell on the ground. She did report some shortness of breath associated with it that lasted for a few minutes . Denies any chest pain, palpitations, vertigo ,nausea or vomiting.Denies any loss of consciousness or head trauma or any seizure-like activity.   Her son also mentioned that about 2 weeks ago when patient was visiting some of the family member at nursing home-she was about to take her own medications and all of a sudden she started staring and did not take her medications. This episode probably lasted for about 1 minute.  2) Diabetes: She has not been taking her medications regularly. She states that she just gets 15 day supply from [facility] for Januvia and therefore she has been running out of her medications for a couple of weeks.  She did not have a right to pick up her medications and therefore she is just taking Amaryl for a few weeks.  3) Hypertension: She has not been taking her lisinopril for last 3 weeks because she did not have a ride to pick up her medications from the pharmacy.    Review of Systems  Constitutional: Negative for fever, chills and fatigue.  HENT: Negative for nosebleeds, congestion, sneezing and postnasal drip.   Eyes: Negative for visual disturbance.  Respiratory: Negative for shortness of breath.   Cardiovascular: Negative for chest pain, palpitations and leg swelling.  Musculoskeletal:  Positive for back pain.  Neurological: Positive for weakness. Negative for facial asymmetry, light-headedness, numbness and headaches.  Psychiatric/Behavioral: Negative for agitation.       Objective:   Physical Exam  Constitutional: She is oriented to person, place, and time. She appears well-developed and well-nourished. No distress.  HENT:  Head: Normocephalic and atraumatic.  Mouth/Throat: No oropharyngeal exudate.  Eyes: Conjunctivae and EOM are normal. Pupils are equal, round, and reactive to light.  Neck: Normal range of motion. Neck supple. No JVD present. No tracheal deviation present. No thyromegaly present.  Cardiovascular: Normal rate, regular rhythm and normal heart sounds.  Exam reveals no gallop and no friction rub.   No murmur heard. Pulmonary/Chest: Effort normal and breath sounds normal. No stridor. No respiratory distress. She has no wheezes. She has no rales.  Abdominal: Soft. Bowel sounds are normal. She exhibits no distension. There is no tenderness. There is no rebound.  Musculoskeletal: Normal range of motion. She exhibits no edema and no tenderness.       SLRT was negative  Lymphadenopathy:    She has no cervical adenopathy.  Neurological: She is alert and oriented to person, place, and time. She has normal reflexes. No cranial nerve deficit. Coordination normal.       Motor strength was 5/5 bilaterally in all four extremities. Sensations intact to light touch bilaterally in all four extremities  Skin: Skin is warm. She is not diaphoretic.          Assessment & Plan:

## 2011-11-07 NOTE — Assessment & Plan Note (Signed)
Patient reports having the fall 2 days ago. Patient and her son also gives the description of an episode that sounds like an absence seizure about one to 2 weeks ago. I doubt that the patient would have new onset absence seizures at this age. The exact etiology of her symptoms is not clear. Differentials include neurological causes ( like intra-articular space occupying lesion) versus cardiac causes like arrhythmias versus electrolyte abnormalities. Hypoglycemia  is unlikely with her poorly controlled diabetes but she is on Amaryl. Non ketotic  Hyperglycemia can induce focal motor seizures but I doubt that her CBG's were that high. Patient has definitely has problems with medication compliance. She clearly states that it was not a mechanical fall. -Would obtain a CT head to look for any lesions that could explain her vague symptoms. - Check CBC, CMET.

## 2011-11-18 ENCOUNTER — Ambulatory Visit (HOSPITAL_COMMUNITY)
Admission: RE | Admit: 2011-11-18 | Discharge: 2011-11-18 | Disposition: A | Discharge status: Home / Self Care | Payer: Medicaid Other | Source: Ambulatory Visit | Attending: Internal Medicine | Admitting: Internal Medicine

## 2011-11-18 ENCOUNTER — Encounter (HOSPITAL_COMMUNITY): Payer: Self-pay

## 2011-11-18 DIAGNOSIS — R55 Syncope and collapse: Secondary | ICD-10-CM | POA: Insufficient documentation

## 2011-11-18 DIAGNOSIS — R569 Unspecified convulsions: Secondary | ICD-10-CM | POA: Insufficient documentation

## 2011-11-18 HISTORY — DX: Essential (primary) hypertension: I10

## 2011-12-10 ENCOUNTER — Encounter: Payer: Self-pay | Admitting: Family Medicine

## 2011-12-10 ENCOUNTER — Ambulatory Visit (INDEPENDENT_AMBULATORY_CARE_PROVIDER_SITE_OTHER): Payer: Medicaid Other | Admitting: Family Medicine

## 2011-12-10 VITALS — BP 139/95 | HR 112 | Ht 62.0 in | Wt 152.0 lb

## 2011-12-10 DIAGNOSIS — M1990 Unspecified osteoarthritis, unspecified site: Secondary | ICD-10-CM

## 2011-12-10 MED ORDER — PREGABALIN 50 MG PO CAPS: ORAL_CAPSULE | ORAL | Status: DC

## 2011-12-10 MED ORDER — LIDOCAINE 5 % EX PTCH: 1.0000 | MEDICATED_PATCH | CUTANEOUS | Status: AC

## 2011-12-10 MED ORDER — KETOROLAC TROMETHAMINE 60 MG/2ML IM SOLN: 60.0000 mg | Freq: Once | INTRAMUSCULAR | Status: DC

## 2011-12-10 NOTE — Progress Notes (Signed)
  Subjective:    Patient ID: Tiffany [name], female    DOB: June 04, 1950, 61 y.o.   MRN: [id number]  HPI  Tiffany [name] is a pleasant 61 years of patient went today to followup on her low back pain. A  Patient was being followed by Dr. Ashley [name]. She did go and have an epidural injection which she said did help for approximately 3 weeks but now the pain is back. She has been having chronic low back pain, she had a laminectomy back in the 90s. Her pain is severe, located in the low back, radiated on and off to both gluteal areas more on the left, no numbness or tingling.   The lumbar MRI shows diffuse DJD more prominent at L1-L2, L4-L5,L5-S1. There is also facet arthropathy at L4-L5. Patient's options included potentially seen an orthopedic surgeon for surgical intervention with patient still declines at this time. Patient also was told about potential he's been sent to pain management for further control. Patient was given a fentanyl patch by Dr. Ashley [name] which patient states was too much causing her to sleep all the time and feeling dizzy. Patient states she has tried multiple other medications including tramadol, anti-inflammatories, Neurontin, Effexor with no improvement.  Patient Active Problem List  Diagnosis  . DIABETES MELLITUS  . HYPERLIPIDEMIA  . HYPERTENSION  . ALLERGIC RHINITIS  . DEGENERATIVE JOINT DISEASE  . LATERAL EPICONDYLITIS  . SLEEP APNEA  . Insomnia  . Abdominal pain, right upper quadrant  . Fall   Review of Systems  Constitutional: Negative for fever, chills, diaphoresis and fatigue.  Musculoskeletal: Positive for back pain. Negative for joint swelling, arthralgias and gait problem.  Neurological: Negative for weakness and numbness.       Objective:   Physical Exam  Constitutional: She is oriented to person, place, and time. She appears well-developed and well-nourished.   vitals reviewed     Musculoskeletal:        Low back with intact skin. No swelling, no hematomas.  Decrease ROM for flexion, extension, rotation and lateralization. Pain w/extension and rotation. Tenderness to palpation on SI joint area B/L. Faber test positive for SI joint pain. Straight leg positive in the left at 30 degrees. Strength 5/5 for hip flexion and extension, 5/5 for knee flexion and extension, 5/5 for ankle plantar and dorsal flexion on the right,3/5 for hip flexion and extension, 3/5 for knee flexion and extension, DTR patellar and achilles 2/4 B/L Sensation intact distally B/L. No leg discrepancy.    Neurological: She is alert and oriented to person, place, and time.  Skin: Skin is warm. No rash noted. No erythema. No pallor.  Psychiatric: She has a normal mood and affect. Her behavior is normal.      Assessment & Plan:

## 2011-12-10 NOTE — Patient Instructions (Signed)
Nice to meet you both.  I am giving you a shot today to try to help with the inflammation.  I am also going to give you a patch called lidocaine.  Wear it for 12 hours at a time.  I am also going to start lyrica to see if we can help that deep burning pain.  I would like you to come back in 3-4 weeks and see how you are doing.   We can consider doing a SI joint injection to see if that would help or the ESI (epidural Steroid injection).  Continue the exercises they will help.

## 2011-12-10 NOTE — Assessment & Plan Note (Signed)
Discuss with patient greater than 30 minutes about treatment options at this time. Patient given a shot of portal to try to decrease inflammation quickly but we'll not do long-term anti-inflammatory secondary to history of gastric ulcer disease. In addition patient will continue do the exercises and stretching at home encourage her and her to do this on a daily basis. For pain control we will try lidocaine patches and Lyrica now. If patient's insurance does not seem to cover the Lyrica she was told that we'll try Cymbalta pain or other potential treatment option. I like to avoid any narcotics in this individual. Patient given red flags and when to seek medical attention. Patient given the choice to go to physical therapy again which patient declined stating that last time it does seem to worsen the problem. Patient will return in 3-4 weeks. If she does not seem to be doing a better we will need considered sending her to orthopedics for surgical intervention or encourage patient to do the epidural steroid injection which she declines to get a second one at this time. I would encourage the epidural steroid injection first. 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

## 2011-12-10 NOTE — Progress Notes (Signed)
IM TORADOL 60MG   L GLUTE PT TOL WELL  HOSPIRA LOT: [id number] EXP: [id number]

## 2011-12-12 ENCOUNTER — Other Ambulatory Visit: Payer: Self-pay | Admitting: *Deleted

## 2011-12-12 MED ORDER — DULOXETINE HCL 30 MG PO CPEP: ORAL_CAPSULE | ORAL | Status: DC

## 2011-12-12 MED ORDER — DICLOFENAC SODIUM 1 % TD GEL: TRANSDERMAL | Status: DC

## 2011-12-12 NOTE — Addendum Note (Signed)
Addended by: Annita Brod on: 12/12/2011 09:26 AM   Modules accepted: Orders

## 2012-01-14 ENCOUNTER — Other Ambulatory Visit: Payer: Self-pay | Admitting: *Deleted

## 2012-01-14 DIAGNOSIS — E119 Type 2 diabetes mellitus without complications: Secondary | ICD-10-CM

## 2012-01-14 MED ORDER — SITAGLIPTIN PHOSPHATE 100 MG PO TABS: 100.0000 mg | ORAL_TABLET | Freq: Every day | ORAL | Status: DC

## 2012-01-15 NOTE — Telephone Encounter (Signed)
Rx faxed in.

## 2012-01-29 ENCOUNTER — Encounter: Payer: Self-pay | Admitting: Sports Medicine

## 2012-01-29 ENCOUNTER — Ambulatory Visit (INDEPENDENT_AMBULATORY_CARE_PROVIDER_SITE_OTHER): Payer: Self-pay | Admitting: Sports Medicine

## 2012-01-29 ENCOUNTER — Other Ambulatory Visit: Payer: Self-pay | Admitting: Sports Medicine

## 2012-01-29 VITALS — BP 134/86 | HR 106 | Ht 62.0 in | Wt 152.0 lb

## 2012-01-29 DIAGNOSIS — M1990 Unspecified osteoarthritis, unspecified site: Secondary | ICD-10-CM

## 2012-01-29 DIAGNOSIS — M549 Dorsalgia, unspecified: Secondary | ICD-10-CM

## 2012-01-29 MED ORDER — KETOROLAC TROMETHAMINE 60 MG/2ML IM SOLN: 60.0000 mg | Freq: Once | INTRAMUSCULAR | Status: AC

## 2012-01-29 MED ORDER — CYCLOBENZAPRINE HCL 10 MG PO TABS: 10.0000 mg | ORAL_TABLET | Freq: Two times a day (BID) | ORAL | Status: DC | PRN

## 2012-01-29 MED ORDER — MELOXICAM 15 MG PO TABS
ORAL_TABLET | ORAL | Status: DC
Start: 2012-01-29 — End: 2013-03-31

## 2012-01-29 NOTE — Patient Instructions (Signed)
I am sorry you are hurting still.  We are giving you 2 injections today.   I am giving you a medicine called meloxicam to take daily for 2 weeks then as needed thereafter.  I am scheduling for another epidural injection for your back.  I am hoping this one will give you more relief.  Please come back and see me again 3 weeks after your epidural injection to make ure you are improving.

## 2012-01-29 NOTE — Progress Notes (Signed)
toradol 60mg  im right glute [facility] [id number] Dec1, 2013   Depo medrol 40mg  im left glute [facility] [id number] 05/2014

## 2012-01-29 NOTE — Assessment & Plan Note (Signed)
Patient is still having significant lower back and sacral pain. Patient was unable to tolerate any manipulation therapy today. Patient has exhausted many of her other options at this time. We will get patient to get her second epidural injection and see if this improves patient was offered a sacroiliac joint injection today which she did declined. Patient given injection of Toradol as well as Depo-Medrol.  Will have patient follow up after epidural injection. If patient is tolerating the pain better maybe then we can consider manipulation. Also we can be more aggressive on her home exercise program to help with core strength which would be beneficial.

## 2012-01-29 NOTE — Progress Notes (Signed)
  Subjective:    Patient ID: Tiffany [name], female    DOB: [birth year], 61 y.o.   MRN: [id number]  HPI  Mrs. Lesmeister is a pleasant 61 years of patient went today to followup on her low back pain. Patient continues to have low back pain and did have an exacerbation after taking her mom from a fall. Patient states that the pain continues to be almost bilaterally in the lower back. Patient does point to more of the sacroiliac joint more on the right than the left. Patient states she does have some radiculopathy down both legs from time to time but at the moment mostly just stiffness in the lower back. Patient denies any numbness or weakness of the legs but states that her legs can feel heavy from time to time. Patient was given lidocaine patches last time and she states that these are not beneficial. Patient also was given a prescription for Cymbalta which she was not able to continue after the first month due to cost and she felt like it was not working. Patient continues to try to do the stretches on a daily basis and does notice some mild improvement when she does that. Patient though is becoming very frustrated with her back and she is concerned she'll not get better.  History of back pain: The lumbar MRI shows diffuse DJD more prominent at L1-L2, L4-L5,L5-S1. There is also facet arthropathy at L4-L5. Patient's options included potentially seen an orthopedic surgeon for surgical intervention with patient still declines at this time.  Patient also was told about potential he's been sent to pain management for further control. Patient was given a fentanyl patch by Dr. Ashley Jacobs which patient states was too much causing her to sleep all the time and feeling dizzy. Patient states she has tried multiple other medications including tramadol, anti-inflammatories, Neurontin, Effexor with no improvement.  Review of Systems  Constitutional: Negative for fever, chills, diaphoresis and fatigue.  Musculoskeletal: Positive  for back pain. Negative for joint swelling, arthralgias and gait problem.  Neurological: Negative for weakness and numbness.       Objective:   Physical Exam  Constitutional: She is oriented to person, place, and time. She appears well-developed and well-nourished.  Filed Vitals:   01/29/12 1045  BP: 134/86  Pulse: 106    Musculoskeletal:        Low back with intact skin. No swelling, no hematomas. Decrease ROM for flexion, extension, rotation and lateralization. Pain w/extension and rotation. Tenderness to palpation on SI joint area B/L right greater than left.  Patient even became tearful at that time. Faber test positive for SI joint pain on right. Straight leg positive in the left at 30 degrees. Strength 5/5 for hip flexion and extension, 5/5 for knee flexion and extension, 5/5 for ankle plantar and dorsal flexion on the right,3/5 for hip flexion and extension, 3/5 for knee flexion and extension, DTR patellar and achilles 2/4 B/L Sensation intact distally B/L. No leg discrepancy.    Neurological: She is alert and oriented to person, place, and time.  Skin: Skin is warm. No rash noted. No erythema. No pallor.  Psychiatric: She has a normal mood and affect. She was tearful after exam unable to do any osteopathic manipulation secondary to the pain.    Assessment & Plan:

## 2012-02-13 ENCOUNTER — Encounter: Payer: Self-pay | Admitting: Gastroenterology

## 2012-02-18 ENCOUNTER — Ambulatory Visit: Payer: Self-pay | Admitting: Family Medicine

## 2012-02-19 ENCOUNTER — Encounter: Payer: Self-pay | Admitting: *Deleted

## 2012-02-19 NOTE — Progress Notes (Signed)
Patient ID: Tiffany [name], female   DOB: [birth year], 61 y.o.   MRN: [id number]  Spoke with Duwayne Heck @ [facility]- she has tried to contact pt 3 times and has not been able to reach her.  Pt has not been scheduled for for ESI.

## 2012-04-01 ENCOUNTER — Other Ambulatory Visit: Payer: Self-pay | Admitting: *Deleted

## 2012-04-01 DIAGNOSIS — E119 Type 2 diabetes mellitus without complications: Secondary | ICD-10-CM

## 2012-04-01 MED ORDER — SITAGLIPTIN PHOSPHATE 100 MG PO TABS: 100.0000 mg | ORAL_TABLET | Freq: Every day | ORAL | Status: DC

## 2012-04-01 NOTE — Telephone Encounter (Signed)
Rx called in to pharmacy. 

## 2012-05-12 ENCOUNTER — Encounter: Payer: Self-pay | Admitting: Internal Medicine

## 2012-05-12 ENCOUNTER — Ambulatory Visit (INDEPENDENT_AMBULATORY_CARE_PROVIDER_SITE_OTHER): Payer: No Typology Code available for payment source | Admitting: Internal Medicine

## 2012-05-12 VITALS — BP 136/92 | HR 100 | Temp 98.5°F | Ht 62.0 in | Wt 157.9 lb

## 2012-05-12 DIAGNOSIS — Z79899 Other long term (current) drug therapy: Secondary | ICD-10-CM

## 2012-05-12 DIAGNOSIS — N9489 Other specified conditions associated with female genital organs and menstrual cycle: Secondary | ICD-10-CM

## 2012-05-12 DIAGNOSIS — R682 Dry mouth, unspecified: Secondary | ICD-10-CM

## 2012-05-12 DIAGNOSIS — Z23 Encounter for immunization: Secondary | ICD-10-CM

## 2012-05-12 DIAGNOSIS — K117 Disturbances of salivary secretion: Secondary | ICD-10-CM

## 2012-05-12 DIAGNOSIS — N898 Other specified noninflammatory disorders of vagina: Secondary | ICD-10-CM

## 2012-05-12 DIAGNOSIS — I10 Essential (primary) hypertension: Secondary | ICD-10-CM

## 2012-05-12 DIAGNOSIS — Z0000 Encounter for general adult medical examination without abnormal findings: Secondary | ICD-10-CM

## 2012-05-12 DIAGNOSIS — G4700 Insomnia, unspecified: Secondary | ICD-10-CM

## 2012-05-12 DIAGNOSIS — E119 Type 2 diabetes mellitus without complications: Secondary | ICD-10-CM

## 2012-05-12 LAB — CBC
HCT: 41.6 % (ref 36.0–46.0)
MCHC: 33.4 g/dL (ref 30.0–36.0)
MCV: 85.1 fL (ref 78.0–100.0)
RDW: 13.6 % (ref 11.5–15.5)

## 2012-05-12 LAB — COMPLETE METABOLIC PANEL WITH GFR
ALT: 14 U/L (ref 0–35)
AST: 14 U/L (ref 0–37)
Albumin: 4.1 g/dL (ref 3.5–5.2)
Alkaline Phosphatase: 85 U/L (ref 39–117)
Potassium: 4.9 mEq/L (ref 3.5–5.3)
Sodium: 132 mEq/L — ABNORMAL LOW (ref 135–145)
Total Protein: 8.4 g/dL — ABNORMAL HIGH (ref 6.0–8.3)

## 2012-05-12 LAB — GLUCOSE, CAPILLARY: Glucose-Capillary: 224 mg/dL — ABNORMAL HIGH (ref 70–99)

## 2012-05-12 LAB — POCT GLYCOSYLATED HEMOGLOBIN (HGB A1C): Hemoglobin A1C: 13.1

## 2012-05-12 MED ORDER — ZOLPIDEM TARTRATE 10 MG PO TABS: ORAL_TABLET | ORAL | Status: DC

## 2012-05-12 MED ORDER — GLIMEPIRIDE 4 MG PO TABS: 4.0000 mg | ORAL_TABLET | Freq: Two times a day (BID) | ORAL | Status: DC

## 2012-05-12 MED ORDER — SITAGLIPTIN PHOSPHATE 100 MG PO TABS: 100.0000 mg | ORAL_TABLET | Freq: Every day | ORAL | Status: DC

## 2012-05-12 NOTE — Patient Instructions (Addendum)
General Instructions: Please schedule a follow up appointment in 1 month . Please bring your medication bottles with your next appointment. Please take your medicines as prescribed. I will call you with your lab results if anything will be abnormal.    Treatment Goals:  Goals (1 Years of Data) as of 05/12/2012          As of Today 01/29/12 12/10/11 11/04/11 10/11/11     Blood Pressure    . Blood Pressure < 140/90  136/92 134/86 139/95 164/101 162/94     Result Component    . HEMOGLOBIN A1C < 7.0  13.1   10.1     . LDL CALC < 100            Progress Toward Treatment Goals:  Treatment Goal 05/12/2012  Hemoglobin A1C deteriorated  Blood pressure at goal    Self Care Goals & Plans:  Self Care Goal 05/12/2012  Manage my medications bring my medications to every visit; take my medicines as prescribed  Monitor my health bring my glucose meter and log to each visit  Eat healthy foods drink diet soda or water instead of juice or soda; eat more vegetables; eat foods that are low in salt  Be physically active find an activity I enjoy    Home Blood Glucose Monitoring 05/12/2012  Check my blood sugar 3 times a day  When to check my blood sugar before meals     Care Management & Community Referrals:  Referral 05/12/2012  Referrals made for care management support none needed; diabetes educator

## 2012-05-12 NOTE — Progress Notes (Signed)
Subjective:   Patient ID: Tiffany [name] female   DOB: [birth year] 61 y.o.   MRN: [id number]  HPI: 61 year old woman with past medical history significant for poorly controlled type 2 diabetes mellitus the setting of medication noncompliance, hypertension, chronic low back pain comes to the clinic for a followup visit.  DM: Patient has not been checking her blood sugars. She did not bring her meter. She states that she takes Amaryl regularly but does not take Januvia as directed because she is worried for getting pancreatitis from the medication.  She also reports having dry mouth and dry lips for last 2-3 weeks. She has increased thirst. Denies any oral ulcers, dry eyes or new rash.  She also reports having vaginal dryness. Denies any hot flashes associated with it  She also reports increased reflux symptoms Tiffany since with chili sauce. Denies any abdominal pain, nausea or vomiting associated with it.  Past Medical History  Diagnosis Date  . Diabetes mellitus   . Hypertension    No family history on file. History   Social History  . Marital Status: Single    Spouse Name: N/A    Number of Children: N/A  . Years of Education: N/A   Occupational History  . Not on file.   Social History Main Topics  . Smoking status: Former Smoker    Quit date: 08/02/1990  . Smokeless tobacco: Never Used  . Alcohol Use: Not on file  . Drug Use: Not on file  . Sexually Active: Not on file   Other Topics Concern  . Not on file   Social History Narrative  . No narrative on file   Review of Systems: General: Denies fever, chills, diaphoresis, appetite change and fatigue. HEENT: Denies photophobia, eye pain, redness, hearing loss, ear pain, congestion, sore throat, rhinorrhea, sneezing, mouth sores, trouble swallowing, neck pain, neck stiffness and tinnitus. Respiratory: Denies SOB, DOE, cough, chest tightness, and wheezing. Cardiovascular: Denies to chest pain, palpitations and leg  swelling. Gastrointestinal: Denies nausea, vomiting, abdominal pain, diarrhea, constipation, blood in stool and abdominal distention. Genitourinary: Denies dysuria, urgency, frequency, hematuria, flank pain and difficulty urinating. Musculoskeletal: Denies myalgias, + back pain, joint swelling, arthralgias and gait problem.  Skin: Denies pallor, rash and wound. Neurological: Denies dizziness, seizures, syncope, weakness, light-headedness, numbness and headaches. Hematological: Denies adenopathy, easy bruising, personal or family bleeding history. Psychiatric/Behavioral: Denies suicidal ideation, mood changes, confusion, nervousness, sleep disturbance and agitation.    Current Outpatient Medications: Current Outpatient Prescriptions  Medication Sig Dispense Refill  . amLODipine (NORVASC) 5 MG tablet Take 1 tablet (5 mg total) by mouth daily.  30 tablet  3  . atorvastatin (LIPITOR) 10 MG tablet Take 1 tablet (10 mg total) by mouth daily.  30 tablet  3  . Bismuth Subsalicylate 262 MG TABS Take 2.0038 tablets (525 mg total) by mouth 4 (four) times a week. Tak  56 each  0  . Blood Glucose Monitoring Suppl DEVI Use to check blood sugar as directed up to 3 times a day  1 each  0  . cyclobenzaprine (FLEXERIL) 10 MG tablet Take 1 tablet (10 mg total) by mouth 2 (two) times daily as needed for muscle spasms. 1 as needed for severe muscle pain  180 tablet  3  . diclofenac sodium (VOLTAREN) 1 % GEL aaa tid-qid  100 g  3  . fluticasone (FLONASE) 50 MCG/ACT nasal spray Place 2 sprays into the nose daily.  16 g  11  . glimepiride (AMARYL) 4  MG tablet Take 1 tablet (4 mg total) by mouth 2 (two) times daily.  60 tablet  11  . glucose blood test strip Use to check blood sugar as directed up to 3 times a day  100 each  12  . Lancets 30G MISC Use to check blood sugar as directed up to 3 times a day  100 each  5  . meloxicam (MOBIC) 15 MG tablet 1 pill daily for 2 weeks then as needed thereafter.  30 tablet  2   . omeprazole (PRILOSEC) 20 MG capsule Take 1 capsule (20 mg total) by mouth 2 (two) times daily.  14 capsule  0  . quinapril (ACCUPRIL) 40 MG tablet Take 1 tablet (40 mg total) by mouth daily.  90 tablet  3  . sitaGLIPtin (JANUVIA) 100 MG tablet Take 1 tablet (100 mg total) by mouth daily.  30 tablet  3  . zolpidem (AMBIEN) 10 MG tablet Take 1 tablet (10 mg total) by mouth at bedtime as needed. No more than twice a week  30 tablet  0    Allergies: Allergies  Allergen Reactions  . Aspirin Nausea And Vomiting    Can take EC asa  . Contrast Media (Iodinated Diagnostic Agents) Nausea And Vomiting  . Shellfish Allergy Nausea And Vomiting      Objective:   Physical Exam: Filed Vitals:   05/12/12 1612  BP: 136/92  Pulse: 100  Temp: 98.5 F (36.9 C)    General: Vital signs reviewed and noted. Well-developed, well-nourished, in no acute distress; alert, appropriate and cooperative throughout examination. Head: Normocephalic, atraumatic Lungs: Normal respiratory effort. Clear to auscultation BL without crackles or wheezes. Heart: RRR. S1 and S2 normal without gallop, murmur, or rubs. Abdomen:BS normoactive. Soft, Nondistended, non-tender.  No masses or organomegaly. Extremities: No pretibial edema.     Assessment & Plan:

## 2012-05-13 DIAGNOSIS — R682 Dry mouth, unspecified: Secondary | ICD-10-CM | POA: Insufficient documentation

## 2012-05-13 DIAGNOSIS — Z0000 Encounter for general adult medical examination without abnormal findings: Secondary | ICD-10-CM | POA: Insufficient documentation

## 2012-05-13 NOTE — Assessment & Plan Note (Signed)
-   She got a flu shot today.  -Would schedule an appointment for her mammogram.

## 2012-05-13 NOTE — Assessment & Plan Note (Signed)
   BP Readings from Last 3 Encounters:  05/12/12 136/92  01/29/12 134/86  12/10/11 139/95    Assessment: Hypertension control:  controlled  Progress toward goals:  at goal Barriers to meeting goals:  no barriers identified  Plan: Hypertension treatment:  continue current medications

## 2012-05-13 NOTE — Assessment & Plan Note (Signed)
Reports having feeling of dry mouth for last month or so along with cracking of the lips. My exam is consistent with likely angular stomatitis. Does not have oral candidiasis. I think these symptoms and manifestations of likely from vitamin B- deficiency. Dry mouth and polydipsia could also be related to her poorly controlled diabetes -Check vitamin B12 levels. -Was encouraged to take over-the-counter vitamin B supplements.

## 2012-05-13 NOTE — Assessment & Plan Note (Addendum)
Her A1c has deteriorated from 10.1-13.1 in the setting of medication noncompliance and lack of understanding for the disease. She did not bring her meter and likely is not checking her blood sugars .She states that she takes her Amaryl but misses Januvia as she is apprehensive for getting pancreatitis from that. There has been some case reports of januvia and pancreatitis and her concern is legitimate. Unfortunately she could not tolerate metformin in the past and is not willing to try that again. Also given her AIC , her DM would be better managed on insulin.  Patient was advised to get started on insulin and 30 minutes were spent to educate, counsel and convince her that insulin is a better option for her but she still wants to stick to oral agents. She was educated that poorly controlled diabetes can result in complications including kidney damage, stroke, heart attack. She understands that but still wants to try oral agents. She states she would be more compliant with her Amaryl and Januvia and would followup in a month. She promises to get her meter at that time and if her blood sugars are still out of control she may consider to be started on insulin then. -Schedule a followup visit in one month. -Would also seek assistance from a diabetes educator Lupita Leash Plyler in her case

## 2012-05-14 DIAGNOSIS — N898 Other specified noninflammatory disorders of vagina: Secondary | ICD-10-CM | POA: Insufficient documentation

## 2012-05-14 MED ORDER — ESTROGENS, CONJUGATED 0.625 MG/GM VA CREA: TOPICAL_CREAM | Freq: Every day | VAGINAL | Status: DC

## 2012-05-14 NOTE — Assessment & Plan Note (Signed)
She was given the prescription for estrogen cream.

## 2012-05-28 ENCOUNTER — Other Ambulatory Visit: Payer: Self-pay | Admitting: *Deleted

## 2012-05-28 DIAGNOSIS — E119 Type 2 diabetes mellitus without complications: Secondary | ICD-10-CM

## 2012-05-28 MED ORDER — SITAGLIPTIN PHOSPHATE 100 MG PO TABS: 100.0000 mg | ORAL_TABLET | Freq: Every day | ORAL | Status: DC

## 2012-05-28 NOTE — Telephone Encounter (Signed)
Rx called in to pharmacy. 

## 2012-06-05 ENCOUNTER — Telehealth: Payer: Self-pay | Admitting: Dietician

## 2012-06-05 NOTE — Telephone Encounter (Signed)
Called to see if patient has a meter and wants to schedule with CDE on same day as doctor appointment next week.   Patient reports she has a meter and is checking but not like she would like: blood sugar174 this am, is getting better. CDE suggested she check other times of day this weekend to provide more information for Monday visit. Patient reports her food intake is not responsible for hyperglycemia, but steroid injections and Sleep problems.   Agreed to appointment Monday with CDE but says Car problems may prevent her from coming, but if she has to reschedule- she will reschedule with CDE

## 2012-06-08 ENCOUNTER — Ambulatory Visit (INDEPENDENT_AMBULATORY_CARE_PROVIDER_SITE_OTHER): Payer: No Typology Code available for payment source | Admitting: Dietician

## 2012-06-08 ENCOUNTER — Ambulatory Visit (INDEPENDENT_AMBULATORY_CARE_PROVIDER_SITE_OTHER): Payer: No Typology Code available for payment source | Admitting: Internal Medicine

## 2012-06-08 ENCOUNTER — Encounter: Payer: Self-pay | Admitting: Internal Medicine

## 2012-06-08 VITALS — BP 144/95 | HR 100 | Temp 97.5°F | Ht 62.0 in | Wt 161.7 lb

## 2012-06-08 DIAGNOSIS — I10 Essential (primary) hypertension: Secondary | ICD-10-CM

## 2012-06-08 DIAGNOSIS — E119 Type 2 diabetes mellitus without complications: Secondary | ICD-10-CM

## 2012-06-08 DIAGNOSIS — M549 Dorsalgia, unspecified: Secondary | ICD-10-CM

## 2012-06-08 DIAGNOSIS — Z0000 Encounter for general adult medical examination without abnormal findings: Secondary | ICD-10-CM

## 2012-06-08 DIAGNOSIS — M1990 Unspecified osteoarthritis, unspecified site: Secondary | ICD-10-CM

## 2012-06-08 MED ORDER — AMITRIPTYLINE HCL 75 MG PO TABS: 75.0000 mg | ORAL_TABLET | Freq: Every day | ORAL | Status: DC

## 2012-06-08 MED ORDER — INSULIN GLARGINE 100 UNIT/ML ~~LOC~~ SOLN: 10.0000 [IU] | Freq: Every day | SUBCUTANEOUS | Status: DC

## 2012-06-08 NOTE — Progress Notes (Signed)
Subjective:   Patient ID: Tiffany [name] female   DOB: [birth year] 62 y.o.   MRN: [id number]  HPI: 62 year old woman with past medical history significant for chronic back pain secondary to degenerative disc disease, status post back surgery 1989 and steroid shots, poorly controlled type 2 diabetes mellitus in the setting of medication noncompliance is and so the clinic for a followup visit.  Patient was a tearful and upset from her severe back pain that has been interfering with her ADLs. She reports pain mostly in the lower back with occasional radiation to the legs , associated with tingling or numbness . If that she had to a sometimes all day in the bed from her severe back pain . She thinks that she is steroid shots in her back has been responsible for deterioration of her diabetes. She still has not been checking her blood sugars as directed. She just had couple of readings in her meter which were significantly elevated in high 200s to 300s. She claims to be taking her Amaryl and Januvia regularly.    Past Medical History  Diagnosis Date  . Diabetes mellitus   . Hypertension    No family history on file. History   Social History  . Marital Status: Single    Spouse Name: N/A    Number of Children: N/A  . Years of Education: N/A   Occupational History  . Not on file.   Social History Main Topics  . Smoking status: Former Smoker    Quit date: 08/02/1990  . Smokeless tobacco: Never Used  . Alcohol Use: Not on file  . Drug Use: Not on file  . Sexually Active: Not on file   Other Topics Concern  . Not on file   Social History Narrative  . No narrative on file   Review of Systems: General: Denies fever, chills, diaphoresis, appetite change and fatigue. HEENT: Denies photophobia, eye pain, redness, hearing loss, ear pain, congestion, sore throat, rhinorrhea, sneezing, mouth sores, trouble swallowing, neck pain, neck stiffness and tinnitus. Respiratory: Denies SOB, DOE, cough,  chest tightness, and wheezing. Cardiovascular: Denies to chest pain, palpitations and leg swelling. Gastrointestinal: Denies nausea, vomiting, abdominal pain, diarrhea, constipation, blood in stool and abdominal distention. Genitourinary: Denies dysuria, urgency, frequency, hematuria, flank pain and difficulty urinating. Musculoskeletal: Denies myalgias,, joint swelling, arthralgias and gait problem, + back pain.  Skin: Denies pallor, rash and wound. Neurological: Denies dizziness, seizures, syncope, weakness, light-headedness, numbness and headaches. Hematological: Denies adenopathy, easy bruising, personal or family bleeding history. Psychiatric/Behavioral: Denies suicidal ideation, mood changes, confusion, nervousness, sleep disturbance and agitation.    Current Outpatient Medications: Current Outpatient Prescriptions  Medication Sig Dispense Refill  . amLODipine (NORVASC) 5 MG tablet Take 1 tablet (5 mg total) by mouth daily.  30 tablet  3  . atorvastatin (LIPITOR) 10 MG tablet Take 1 tablet (10 mg total) by mouth daily.  30 tablet  3  . Bismuth Subsalicylate 262 MG TABS Take 2.0038 tablets (525 mg total) by mouth 4 (four) times a week. Tak  56 each  0  . Blood Glucose Monitoring Suppl DEVI Use to check blood sugar as directed up to 3 times a day  1 each  0  . conjugated estrogens (PREMARIN) vaginal cream Place vaginally daily.  42.5 g  12  . cyclobenzaprine (FLEXERIL) 10 MG tablet Take 1 tablet (10 mg total) by mouth 2 (two) times daily as needed for muscle spasms. 1 as needed for severe muscle pain  180 tablet  3  . diclofenac sodium (VOLTAREN) 1 % GEL aaa tid-qid  100 g  3  . fluticasone (FLONASE) 50 MCG/ACT nasal spray Place 2 sprays into the nose daily.  16 g  11  . glimepiride (AMARYL) 4 MG tablet Take 1 tablet (4 mg total) by mouth 2 (two) times daily.  60 tablet  11  . glucose blood test strip Use to check blood sugar as directed up to 3 times a day  100 each  12  . Lancets 30G  MISC Use to check blood sugar as directed up to 3 times a day  100 each  5  . meloxicam (MOBIC) 15 MG tablet 1 pill daily for 2 weeks then as needed thereafter.  30 tablet  2  . omeprazole (PRILOSEC) 20 MG capsule Take 1 capsule (20 mg total) by mouth 2 (two) times daily.  14 capsule  0  . quinapril (ACCUPRIL) 40 MG tablet Take 1 tablet (40 mg total) by mouth daily.  90 tablet  3  . sitaGLIPtin (JANUVIA) 100 MG tablet Take 1 tablet (100 mg total) by mouth daily.  30 tablet  3  . zolpidem (AMBIEN) 10 MG tablet Take 1 tab at bedtime as needed  30 tablet  0    Allergies: Allergies  Allergen Reactions  . Aspirin Nausea And Vomiting    Can take EC asa  . Contrast Media (Iodinated Diagnostic Agents) Nausea And Vomiting  . Shellfish Allergy Nausea And Vomiting      Objective:   Physical Exam: Filed Vitals:   06/08/12 1327  BP: 144/95  Pulse: 100  Temp: 97.5 F (36.4 C)    General: Vital signs reviewed and noted. Well-developed, well-nourished, in no acute distress; alert, appropriate and cooperative throughout examination. Head: Normocephalic, atraumatic Lungs: Normal respiratory effort. Clear to auscultation BL without crackles or wheezes. Heart: RRR. S1 and S2 normal without gallop, murmur, or rubs. Abdomen:BS normoactive. Soft, Nondistended, non-tender.  No masses or organomegaly. Extremities: No pretibial edema. MSK: Negative SLRT     Assessment & Plan:

## 2012-06-08 NOTE — Patient Instructions (Addendum)
General Instructions: Please schedule a follow up appointment in 1- 2months. Please bring your medication bottles with your next appointment. Please take your medicines as prescribed.     Treatment Goals:  Goals (1 Years of Data) as of 06/08/2012          As of Today 05/12/12 01/29/12 12/10/11 11/04/11     Blood Pressure    . Blood Pressure < 140/90  144/95 136/92 134/86 139/95 164/101     Result Component    . HEMOGLOBIN A1C < 7.0   13.1   10.1    . LDL CALC < 100            Progress Toward Treatment Goals:  Treatment Goal 06/08/2012  Hemoglobin A1C deteriorated  Blood pressure improved    Self Care Goals & Plans:  Self Care Goal 06/08/2012  Manage my medications take my medicines as prescribed; bring my medications to every visit  Monitor my health bring my glucose meter and log to each visit  Eat healthy foods drink diet soda or water instead of juice or soda; eat foods that are low in salt; eat more vegetables  Be physically active find an activity I enjoy    Home Blood Glucose Monitoring 06/08/2012  Check my blood sugar 3 times a day  When to check my blood sugar before meals     Care Management & Community Referrals:  Referral 06/08/2012  Referrals made for care management support diabetes educator

## 2012-06-09 NOTE — Assessment & Plan Note (Signed)
-  Would schedule a GI referral for colonoscopy. -Would followup on the appointment date for her mammogram, referral was placed with last clinic visit.

## 2012-06-09 NOTE — Assessment & Plan Note (Signed)
Her A1c has been deteriorating and was noted to be 13.1 in December 2013  in the setting of medication noncompliance. We had a lengthy discussion about starting her on insulin with her last clinic appointment but she resisted that in the followup appointment was scheduled for today within a month span. Patient was convinced to be started on insulin with the help of her diabetes educator , Lupita Leash Plyler today. Greatly appreciate her help! -Start her on 10 units of Lantus. -Continue glimepiride and Januvia for now. -Reschedule a followup appointment in one month to reassess her diabetes.

## 2012-06-09 NOTE — Assessment & Plan Note (Signed)
Patient continues to complain of severe back pain. She was very tearful and upset with the clinic visit today. She has tried gabapentin, nortriptyline Cymbalta, lidocaine patches without any significant improvement. She has a history of back surgery in 1989 and has been getting steroid shots lately, last one about 5 months ago in August 2013. I would give her a trial of amitriptyline again for a month to see if that helps. If not, then I am considering to start this patient on narcotics for her severe back pain which is interfering with her ADLs.

## 2012-06-09 NOTE — Progress Notes (Signed)
Diabetes Self-Management Training (DSMT)  Initial Visit  06/09/2012 [name], identified by name and date of birth, is a 62 y.o. female with Type 2 Diabetes. Year of diabetes diagnosis: will need to assess at future visit  ASSESSMENT Patient concerns are Nutrition/meal planning and Glycemic control. Lab Results  Component Value Date   LDLCALC 143* 09/23/2011    HGBA1C 13.1 05/12/2012   Family history of diabetes: Not sure Support systems: not sure  Special needs: None Patients belief/attitude about diabetes: Diabetes can be controlled. Self foot exams daily: Not sure Diabetes Complications: None Prior DM Education: No   Medications See Medications list.  Is interested in learning more and Needs skills/knowledge review   Exercise Plan Doing ADLs for 60 minutesa day.   Self-Monitoring  Monitor: wave sense pretsto Frequency of testing: 3-5 times/week Breakfast: 174-240 reported by pt Hyperglycemia: Yes Daily Hypoglycemia: No   Meal Planning Limited knowledge, Interested in improving and did not have time today to address- asked patient to schedule a follow up appointment   Assessment comments: patient ready to consider insulin to lower her blood sugars today- fear of injections an issue at first. Does not use CPAP except for ~2 hours a night- sleeps poorly. She describes sleep as From 10pm-12am then 6-8 am. New med being tried for back pain   INDIVIDUAL DIABETES EDUCATION PLAN:  Nutrition management Medication Monitoring Acute complication: Psychosocial adjustment _______________________________________________________________________  Intervention TOPICS COVERED TODAY:  Medication  Taught/evaluated insulin injection, site rotation, insulin storage and needle disposal. Reviewed patients medication for diabetes, action, purpose, timing of dose and side effects.  PATIENTS GOALS/PLAN (copy and paste in patient instructions so patient receives a copy): 1.   Learning Objective:       Demonstrate how to use insulin pen 2.  Behavioral Objective:         Medications: To improve blood glucose levels, I will take my medication as prescribed Half of the time 50%  Personalized Follow-Up Plan for Ongoing Self Management Support:  Doctor's Office and CDE visits ______________________________________________________________________   Outcomes Expected outcomes: Demonstrated interest in learning.Expect positive changes in lifestyle. Self-care Barriers: Lack of material resources, Coping skills Education material provided: yes about insulin injection with pens Patient to contact team via Phone if problems or questions. Time in: 1630     Time out: 1700 Future DSMT - 4-6 wks   Plyler, Lupita Leash

## 2012-06-09 NOTE — Patient Instructions (Signed)
Please inject 10 units Lantus per Dr. Jory Ee instruction and continue to take both your diabetes pills- januvia and amaryl.   Check your blood sugar  every morning to know how the 10 units lantus is working  Store the insulin you are NOT using in the refrigerator where it will not freeze.(Do not store the insulin pen you are using in the refrigerator.)  Most insulin pens and vials should be thrown away after 28 or 30 days. (only Levemir after 42 days) (Cloudy insulin pens after 14 days.)   It is important to be consistent with where you inject insulin: Inject in the same part of the body most fo the time, inject about 1 " apart within this part of the body.    Throw away sharpes (pen needles, lancets and syringes) in a hard plastic( best if not see through) container with a screw on lid.   Please make a follow up appointment for 3-4 weeks

## 2012-06-09 NOTE — Assessment & Plan Note (Signed)
Blood pressure mildly elevated likely secondary to pain and stress. Continue current medicines for now.

## 2012-06-12 ENCOUNTER — Telehealth: Payer: Self-pay | Admitting: Dietician

## 2012-06-12 NOTE — Telephone Encounter (Signed)
Has been giving her injections of 10 units lantus, not quite sure that it is making much difference and doesn't trust it yet. CBgs 180/200/150. Not ready to increase  Her dose. Agrees to have front office call her to schedule an appointment mid- February.

## 2012-06-26 ENCOUNTER — Encounter: Payer: Self-pay | Admitting: Gastroenterology

## 2012-07-03 ENCOUNTER — Telehealth: Payer: Self-pay | Admitting: *Deleted

## 2012-07-03 NOTE — Telephone Encounter (Signed)
Pharmacy calls to say pt prefers lantus solostar, verbal ok given, this ok to you? Please make medlist change, thanks

## 2012-07-03 NOTE — Telephone Encounter (Signed)
This is fine with me. Thank you Tiffany [name]! Tiffany [name]

## 2012-07-10 ENCOUNTER — Encounter: Payer: No Typology Code available for payment source | Admitting: Internal Medicine

## 2012-07-10 ENCOUNTER — Encounter: Payer: No Typology Code available for payment source | Admitting: Dietician

## 2012-07-13 ENCOUNTER — Ambulatory Visit (INDEPENDENT_AMBULATORY_CARE_PROVIDER_SITE_OTHER): Payer: No Typology Code available for payment source | Admitting: Internal Medicine

## 2012-07-13 ENCOUNTER — Encounter: Payer: Self-pay | Admitting: Internal Medicine

## 2012-07-13 ENCOUNTER — Ambulatory Visit (INDEPENDENT_AMBULATORY_CARE_PROVIDER_SITE_OTHER): Payer: No Typology Code available for payment source | Admitting: Dietician

## 2012-07-13 VITALS — BP 152/99 | HR 80 | Temp 96.8°F | Ht 63.0 in | Wt 167.9 lb

## 2012-07-13 VITALS — Ht 63.0 in | Wt 168.0 lb

## 2012-07-13 DIAGNOSIS — M549 Dorsalgia, unspecified: Secondary | ICD-10-CM

## 2012-07-13 DIAGNOSIS — M1990 Unspecified osteoarthritis, unspecified site: Secondary | ICD-10-CM

## 2012-07-13 DIAGNOSIS — G4700 Insomnia, unspecified: Secondary | ICD-10-CM

## 2012-07-13 DIAGNOSIS — E119 Type 2 diabetes mellitus without complications: Secondary | ICD-10-CM

## 2012-07-13 MED ORDER — GLIMEPIRIDE 4 MG PO TABS: 4.0000 mg | ORAL_TABLET | Freq: Every day | ORAL | Status: DC

## 2012-07-13 MED ORDER — INSULIN GLARGINE 100 UNIT/ML ~~LOC~~ SOLN: SUBCUTANEOUS | Status: DC

## 2012-07-13 MED ORDER — AMITRIPTYLINE HCL 75 MG PO TABS: 75.0000 mg | ORAL_TABLET | Freq: Every day | ORAL | Status: DC

## 2012-07-13 MED ORDER — ZOLPIDEM TARTRATE 10 MG PO TABS: ORAL_TABLET | ORAL | Status: DC

## 2012-07-13 NOTE — Patient Instructions (Addendum)
General Instructions: Please schedule a follow up appointment in 1-2 months . Please bring your medication bottles with your next appointment. Please take your medicines as prescribed. I will call you with your lab results if anything will be abnormal. Discontinue Januvai. Decrease Amaryl from 4mg  BID to once daily.  Increase lantus by 1 init every day until fasting CBG is less than 130 mg/dl.    Treatment Goals:  Goals (1 Years of Data) as of 07/13/12         As of Today 06/08/12 05/12/12 01/29/12 12/10/11     Blood Pressure    . Blood Pressure < 140/90  152/99 144/95 136/92 134/86 139/95     Result Component    . HEMOGLOBIN A1C < 7.0    13.1      . LDL CALC < 100            Progress Toward Treatment Goals:  Treatment Goal 07/13/2012  Hemoglobin A1C improved  Blood pressure deteriorated    Self Care Goals & Plans:  Self Care Goal 07/13/2012  Manage my medications take my medicines as prescribed; bring my medications to every visit; refill my medications on time; follow the sick day instructions if I am sick  Monitor my health keep track of my blood glucose; bring my glucose meter and log to each visit; check my feet daily  Eat healthy foods eat more vegetables; eat fruit for snacks and desserts; eat smaller portions; eat foods that are low in salt; drink diet soda or water instead of juice or soda  Be physically active take a walk every day; find an activity I enjoy    Home Blood Glucose Monitoring 07/13/2012  Check my blood sugar 3 times a day  When to check my blood sugar before meals     Care Management & Community Referrals:  Referral 07/13/2012  Referrals made for care management support none needed

## 2012-07-13 NOTE — Progress Notes (Signed)
Subjective:   Patient ID: Tiffany [name] female   DOB: Feb 21, 1951 62 y.o.   MRN: [id number]  HPI: 62 year old woman with past medical history significant for poorly controlled diabetes mellitus was recently convinced to be started on insulin, hypertension, hyperlipidemia presents to the clinic for a followup visit.  She states that she has been feeling better in regards to her back pain since she has been started on amitriptyline.  She states that she was taking her insulin until Wednesday when she ran out of Lantus sample ( given at last visit) and did not have any more insulin left. Unfortunately no insulin was available  with the [facility] pharmacy. She has been taking her Januvia and Amaryl. Reviewed her CBG log her average CBG was 178 and she was noted to have some low CBGs in the evening ( 89- 106)which is making her eat more at dinner when she was taking lantus.       Past Medical History  Diagnosis Date  . Diabetes mellitus   . Hypertension    No family history on file. History   Social History  . Marital Status: Single    Spouse Name: N/A    Number of Children: N/A  . Years of Education: N/A   Occupational History  . Not on file.   Social History Main Topics  . Smoking status: Former Smoker    Quit date: 08/02/1990  . Smokeless tobacco: Never Used  . Alcohol Use: Not on file  . Drug Use: Not on file  . Sexually Active: Not on file   Other Topics Concern  . Not on file   Social History Narrative  . No narrative on file   Review of Systems: General: Denies fever, chills, diaphoresis, appetite change and fatigue. HEENT: Denies photophobia, eye pain, redness, hearing loss, ear pain, congestion, sore throat, rhinorrhea, sneezing, mouth sores, trouble swallowing, neck pain, neck stiffness and tinnitus. Respiratory: Denies SOB, DOE, cough, chest tightness, and wheezing. Cardiovascular: Denies to chest pain, palpitations and leg swelling. Gastrointestinal: Denies  nausea, vomiting, abdominal pain, diarrhea, constipation, blood in stool and abdominal distention. Genitourinary: Denies dysuria, urgency, frequency, hematuria, flank pain and difficulty urinating. Musculoskeletal: Denies myalgias, back pain, joint swelling, arthralgias and gait problem.  Skin: Denies pallor, rash and wound. Neurological: Denies dizziness, seizures, syncope, weakness, light-headedness, numbness and headaches. Hematological: Denies adenopathy, easy bruising, personal or family bleeding history. Psychiatric/Behavioral: Denies suicidal ideation, mood changes, confusion, nervousness, sleep disturbance and agitation.    Current Outpatient Medications: Current Outpatient Prescriptions  Medication Sig Dispense Refill  . amitriptyline (ELAVIL) 75 MG tablet Take 1 tablet (75 mg total) by mouth at bedtime.  30 tablet  3  . amLODipine (NORVASC) 5 MG tablet Take 1 tablet (5 mg total) by mouth daily.  30 tablet  3  . atorvastatin (LIPITOR) 10 MG tablet Take 1 tablet (10 mg total) by mouth daily.  30 tablet  3  . Bismuth Subsalicylate 262 MG TABS Take 2.0038 tablets (525 mg total) by mouth 4 (four) times a week. Tak  56 each  0  . Blood Glucose Monitoring Suppl DEVI Use to check blood sugar as directed up to 3 times a day  1 each  0  . conjugated estrogens (PREMARIN) vaginal cream Place vaginally daily.  42.5 g  12  . cyclobenzaprine (FLEXERIL) 10 MG tablet Take 1 tablet (10 mg total) by mouth 2 (two) times daily as needed for muscle spasms. 1 as needed for severe muscle pain  180  tablet  3  . diclofenac sodium (VOLTAREN) 1 % GEL aaa tid-qid  100 g  3  . fluticasone (FLONASE) 50 MCG/ACT nasal spray Place 2 sprays into the nose daily.  16 g  11  . glimepiride (AMARYL) 4 MG tablet Take 1 tablet (4 mg total) by mouth 2 (two) times daily.  60 tablet  11  . glucose blood test strip Use to check blood sugar as directed up to 3 times a day  100 each  12  . insulin glargine (LANTUS) 100 UNIT/ML  injection Inject 10 Units into the skin at bedtime.  10 mL  3  . Lancets 30G MISC Use to check blood sugar as directed up to 3 times a day  100 each  5  . meloxicam (MOBIC) 15 MG tablet 1 pill daily for 2 weeks then as needed thereafter.  30 tablet  2  . omeprazole (PRILOSEC) 20 MG capsule Take 1 capsule (20 mg total) by mouth 2 (two) times daily.  14 capsule  0  . quinapril (ACCUPRIL) 40 MG tablet Take 1 tablet (40 mg total) by mouth daily.  90 tablet  3  . sitaGLIPtin (JANUVIA) 100 MG tablet Take 1 tablet (100 mg total) by mouth daily.  30 tablet  3  . zolpidem (AMBIEN) 10 MG tablet Take 1 tab at bedtime as needed  30 tablet  0   No current facility-administered medications for this visit.    Allergies: Allergies  Allergen Reactions  . Metformin And Related     Severe nausea and vomiting  . Aspirin Nausea And Vomiting    Can take EC asa  . Contrast Media (Iodinated Diagnostic Agents) Nausea And Vomiting  . Shellfish Allergy Nausea And Vomiting      Objective:   Physical Exam: Filed Vitals:   07/13/12 1618  BP: 152/99  Pulse: 80  Temp: 96.8 F (36 C)    General: Vital signs reviewed and noted. Well-developed, well-nourished, in no acute distress; alert, appropriate and cooperative throughout examination. Head: Normocephalic, atraumatic Lungs: Normal respiratory effort. Clear to auscultation BL without crackles or wheezes. Heart: RRR. S1 and S2 normal without gallop, murmur, or rubs. Abdomen:BS normoactive. Soft, Nondistended, non-tender.  No masses or organomegaly. Extremities: No pretibial edema.     Assessment & Plan:

## 2012-07-13 NOTE — Progress Notes (Signed)
Diabetes Self-Management Training (DSMT)  1st follow up Visit  07/13/2012 Ms. [facility], identified by name and date of birth, is a 62 y.o. female with Type 2 Diabetes. Year of diabetes diagnosis: 2009  ASSESSMENT Patient concerns are Nutrition/meal planning and Glycemic control. Lab Results  Component Value Date   LDLCALC 143* 09/23/2011    HGBA1C 13.1 05/12/2012   Family history of diabetes: yes Support systems: sister and she talk about their diabetes Special needs: None Patients belief/attitude about diabetes: Diabetes can be controlled. Self foot exams- mostly daily Diabetes Complications: None Prior DM Education: yes   Medications See Medications list.  Is interested in learning more and Needs skills/knowledge review   Exercise Plan Doing ADLs for 60 minutesa day. No desire to discuss   Self-Monitoring  Monitor: wave sense pretsto Frequency of testing: 3-5 times/week Breakfast: 164-231 Hyperglycemia: Yes Daily Hypoglycemia: yes- eats at night when she gets shaky and feels bad   Meal Planning Limited knowledge   Assessment comments: likes the insulin pen, feels more confident in insulin. Does not like weight gain.   INDIVIDUAL DIABETES EDUCATION PLAN:  Nutrition management Medication Monitoring Acute complication: Psychosocial adjustment _______________________________________________________________________  Intervention TOPICS COVERED TODAY:  Nutrition- discussed macronutrient effect on blood sugar and weight Medication  Reviewed patients medication for diabetes, action, purpose, timing of dose and side effects. Monitoring- education about using blood sugar results to self manage her diabetes and achieve goals Acute complications- discussed prevention of low blood sugar  PATIENTS GOALS/PLAN (copy and paste in patient instructions so patient receives a copy): 1.  Learning Objective:       Demonstrate how to use insulin pen- done 2.  Behavioral  Objective:         Medications: To improve blood glucose levels, I will take my medication as prescribed Half of the time 85%             Nutrition- eat less sugar and fat to lower blood sugar and weight  Personalized Follow-Up Plan for Ongoing Self Management Support:  Doctor's Office and CDE visits ______________________________________________________________________   Outcomes Expected outcomes: Demonstrated interest in learning.Expect positive changes in lifestyle. Self-care Barriers: Lack of material resources, Coping skills Education material provided: yes- carb counting and meal planning Patient to contact team via Phone if problems or questions. Time in: 1440     Time out: 1540 Future DSMT - 4-6 wks   Tavari Loadholt, Lupita Leash

## 2012-07-13 NOTE — Patient Instructions (Addendum)
Take your medicine at the following times  !2-1 PM both amaryl, januvia, blood pressure medicine  9-10 Pm- blood pressure medicine and Lantus  Your plan To STOP weight gain: cut back sugar in soda by drinking diet soda or water or v- 8 juice                                                      Try to cut back on fat - for example in liver pudding, margarine/butter and cheese amounts  Ask doctor if you can increase your lantus insulin 1 unit each day until morning blood sugar before 1st meal is less than 130.

## 2012-07-13 NOTE — Assessment & Plan Note (Signed)
Improving on amitriptyline. She was given refills on the medicine.

## 2012-07-13 NOTE — Assessment & Plan Note (Addendum)
Lab Results  Component Value Date   HGBA1C 13.1 05/12/2012   HGBA1C 10.1 11/04/2011   HGBA1C 9.4 07/15/2011     Assessment:  Diabetes control: fair control  Progress toward A1C goal:  improved  Comments: Diabetes seems to be under better control since she has been started on lantus. She was noted to have some low CBG' s in evening ( in 10' to low 100's) when she was taking her Lantus along with the Amaryl and Januvia( she ran out of lantus for 5 days). Based on that I would decrease her glimepiride to 4 mg in a.m. and would discontinue her Januvia. Lupita Leash Plyler double checked with [facility] pharmacy and her Lantus is available now. Plan:  Medications:  Increase Lantus by 1 unit every day to keep fasting CBG less than 130 mg/dl.. Decrease Amaryl froom 4 mg twice a day tto once daily. Discontinue Januvia  Home glucose monitoring:   Frequency: 3 times a day   Timing: before meals  Instruction/counseling given: reminded to get eye exam, reminded to bring blood glucose meter & log to each visit, reminded to bring medications to each visit, discussed the need for weight loss, discussed diet and discussed sick day management  Educational resources provided: brochure;handout  Self management tools provided: copy of home glucose meter download  Other plans: She was seen  by Norm Parcel, our diabetes educator and counselor

## 2012-07-15 ENCOUNTER — Encounter: Payer: Self-pay | Admitting: Internal Medicine

## 2012-07-16 ENCOUNTER — Other Ambulatory Visit: Payer: Self-pay | Admitting: Internal Medicine

## 2012-07-16 ENCOUNTER — Ambulatory Visit (AMBULATORY_SURGERY_CENTER): Payer: No Typology Code available for payment source | Admitting: *Deleted

## 2012-07-16 VITALS — Ht 63.0 in | Wt 166.0 lb

## 2012-07-16 DIAGNOSIS — E119 Type 2 diabetes mellitus without complications: Secondary | ICD-10-CM

## 2012-07-16 DIAGNOSIS — Z1211 Encounter for screening for malignant neoplasm of colon: Secondary | ICD-10-CM

## 2012-07-16 MED ORDER — INSULIN GLARGINE 100 UNIT/ML ~~LOC~~ SOLN: SUBCUTANEOUS | Status: DC

## 2012-07-16 NOTE — Progress Notes (Signed)
Pt given sample of suprep

## 2012-07-21 ENCOUNTER — Encounter: Payer: No Typology Code available for payment source | Admitting: Gastroenterology

## 2012-07-22 ENCOUNTER — Encounter: Payer: Self-pay | Admitting: Gastroenterology

## 2012-07-22 ENCOUNTER — Ambulatory Visit (AMBULATORY_SURGERY_CENTER): Payer: No Typology Code available for payment source | Admitting: Gastroenterology

## 2012-07-22 ENCOUNTER — Other Ambulatory Visit: Payer: Self-pay | Admitting: Gastroenterology

## 2012-07-22 VITALS — BP 118/80 | HR 89 | Temp 95.9°F | Resp 15 | Ht 63.0 in | Wt 166.0 lb

## 2012-07-22 DIAGNOSIS — Z860100 Personal history of colon polyps, unspecified: Secondary | ICD-10-CM

## 2012-07-22 DIAGNOSIS — Z1211 Encounter for screening for malignant neoplasm of colon: Secondary | ICD-10-CM

## 2012-07-22 LAB — GLUCOSE, CAPILLARY: Glucose-Capillary: 151 mg/dL — ABNORMAL HIGH (ref 70–99)

## 2012-07-22 MED ORDER — SODIUM CHLORIDE 0.9 % IV SOLN: 500.0000 mL | INTRAVENOUS | Status: DC

## 2012-07-22 NOTE — Patient Instructions (Addendum)
Discharge instructions given with verbal understanding. Normal exam. Resume previous medications. YOU HAD AN ENDOSCOPIC PROCEDURE TODAY AT THE [facility]: Refer to the procedure report that was given to you for any specific questions about what was found during the examination.  If the procedure report does not answer your questions, please call your gastroenterologist to clarify.  If you requested that your care partner not be given the details of your procedure findings, then the procedure report has been included in a sealed envelope for you to review at your convenience later.  YOU SHOULD EXPECT: Some feelings of bloating in the abdomen. Passage of more gas than usual.  Walking can help get rid of the air that was put into your GI tract during the procedure and reduce the bloating. If you had a lower endoscopy (such as a colonoscopy or flexible sigmoidoscopy) you may notice spotting of blood in your stool or on the toilet paper. If you underwent a bowel prep for your procedure, then you may not have a normal bowel movement for a few days.  DIET: Your first meal following the procedure should be a light meal and then it is ok to progress to your normal diet.  A half-sandwich or bowl of soup is an example of a good first meal.  Heavy or fried foods are harder to digest and may make you feel nauseous or bloated.  Likewise meals heavy in dairy and vegetables can cause extra gas to form and this can also increase the bloating.  Drink plenty of fluids but you should avoid alcoholic beverages for 24 hours.  ACTIVITY: Your care partner should take you home directly after the procedure.  You should plan to take it easy, moving slowly for the rest of the day.  You can resume normal activity the day after the procedure however you should NOT DRIVE or use heavy machinery for 24 hours (because of the sedation medicines used during the test).    SYMPTOMS TO REPORT IMMEDIATELY: A gastroenterologist  can be reached at any hour.  During normal business hours, 8:30 AM to 5:00 PM Monday through Friday, call [phone].  After hours and on weekends, please call the GI answering service at [phone] who will take a message and have the physician on call contact you.   Following lower endoscopy (colonoscopy or flexible sigmoidoscopy):  Excessive amounts of blood in the stool  Significant tenderness or worsening of abdominal pains  Swelling of the abdomen that is new, acute  Fever of 100F or higher  FOLLOW UP: If any biopsies were taken you will be contacted by phone or by letter within the next 1-3 weeks.  Call your gastroenterologist if you have not heard about the biopsies in 3 weeks.  Our staff will call the home number listed on your records the next business day following your procedure to check on you and address any questions or concerns that you may have at that time regarding the information given to you following your procedure. This is a courtesy call and so if there is no answer at the home number and we have not heard from you through the emergency physician on call, we will assume that you have returned to your regular daily activities without incident.  SIGNATURES/CONFIDENTIALITY: You and/or your care partner have signed paperwork which will be entered into your electronic medical record.  These signatures attest to the fact that that the information above on your After Visit Summary has been reviewed   and is understood.  Full responsibility of the confidentiality of this discharge information lies with you and/or your care-partner. 

## 2012-07-22 NOTE — Progress Notes (Signed)
Patient did not experience any of the following events: a burn prior to discharge; a fall within the facility; wrong site/side/patient/procedure/implant event; or a hospital transfer or hospital admission upon discharge from the facility. (G8907) Patient did not have preoperative order for IV antibiotic SSI prophylaxis. (G8918)  

## 2012-07-22 NOTE — Op Note (Signed)
[facility] [address] [place], [address]   COLONOSCOPY PROCEDURE REPORT  PATIENT: Tiffany, [name]  MR#: [id number] BIRTHDATE: May 01, 1951 , 62  yrs. old GENDER: Female ENDOSCOPIST: Rachael Fee, MD PROCEDURE DATE:  07/22/2012 PROCEDURE:   Colonoscopy, surveillance ASA CLASS:   Class III INDICATIONS:diminutive TA removed 2007. MEDICATIONS: Fentanyl 75 mcg IV, Versed 6 mg IV, and These medications were titrated to patient response per physician's verbal order  DESCRIPTION OF PROCEDURE:   After the risks benefits and alternatives of the procedure were thoroughly explained, informed consent was obtained.  A digital rectal exam revealed no abnormalities of the rectum.   The LB [id number]  endoscope was introduced through the anus and advanced to the cecum, which was identified by both the appendix and ileocecal valve. No adverse events experienced.   The quality of the prep was good, using MoviPrep  The instrument was then slowly withdrawn as the colon was fully examined.  COLON FINDINGS: A normal appearing cecum, ileocecal valve, and appendiceal orifice were identified.  The ascending, hepatic flexure, transverse, splenic flexure, descending, sigmoid colon and rectum appeared unremarkable.  No polyps or cancers were seen. Retroflexed views revealed no abnormalities. The time to cecum=3 minutes 06 seconds.  Withdrawal time=6 minutes 33 seconds.  The scope was withdrawn and the procedure completed. COMPLICATIONS: There were no complications.  ENDOSCOPIC IMPRESSION: Normal colon No polyps or cancers  RECOMMENDATIONS: You should continue to follow colorectal cancer screening guidelines for "routine risk" patients with a repeat colonoscopy in 10 years. There is no need for FOBT (stool) testing for at least 5 years.   eSigned:  Rachael Fee, MD 07/22/2012 1:39 PM   cc: Elyse Jarvis MD

## 2012-07-23 ENCOUNTER — Telehealth: Payer: Self-pay | Admitting: Internal Medicine

## 2012-07-23 ENCOUNTER — Telehealth: Payer: Self-pay

## 2012-07-23 DIAGNOSIS — E119 Type 2 diabetes mellitus without complications: Secondary | ICD-10-CM

## 2012-07-23 MED ORDER — INSULIN GLARGINE 100 UNIT/ML ~~LOC~~ SOLN: SUBCUTANEOUS | Status: DC

## 2012-07-23 NOTE — Telephone Encounter (Signed)
Left message on answering machine. 

## 2012-07-24 ENCOUNTER — Other Ambulatory Visit: Payer: Self-pay | Admitting: Internal Medicine

## 2012-07-24 DIAGNOSIS — E119 Type 2 diabetes mellitus without complications: Secondary | ICD-10-CM

## 2012-07-24 MED ORDER — INSULIN GLARGINE 100 UNIT/ML ~~LOC~~ SOLN: SUBCUTANEOUS | Status: DC

## 2012-07-24 NOTE — Telephone Encounter (Signed)
Lantus Solostar rx faxed to [facility].

## 2012-08-12 ENCOUNTER — Ambulatory Visit (INDEPENDENT_AMBULATORY_CARE_PROVIDER_SITE_OTHER): Payer: No Typology Code available for payment source | Admitting: Dietician

## 2012-08-12 ENCOUNTER — Telehealth: Payer: Self-pay | Admitting: Dietician

## 2012-08-12 DIAGNOSIS — E1165 Type 2 diabetes mellitus with hyperglycemia: Secondary | ICD-10-CM

## 2012-08-12 DIAGNOSIS — IMO0001 Reserved for inherently not codable concepts without codable children: Secondary | ICD-10-CM

## 2012-08-12 DIAGNOSIS — IMO0002 Reserved for concepts with insufficient information to code with codable children: Secondary | ICD-10-CM

## 2012-08-12 NOTE — Telephone Encounter (Signed)
Needs Sample of lantus  And agrees to eye exam using retinal camera at 4 Pm today. Says she Has [facility] discount. Called [facility] for status of lantus insulin.

## 2012-08-13 NOTE — Telephone Encounter (Signed)
I agree with giving her a sample of lantus.  Thanks, [employer]

## 2012-08-14 ENCOUNTER — Other Ambulatory Visit: Payer: Self-pay | Admitting: *Deleted

## 2012-08-14 DIAGNOSIS — I10 Essential (primary) hypertension: Secondary | ICD-10-CM

## 2012-08-14 MED ORDER — AMLODIPINE BESYLATE 5 MG PO TABS: 5.0000 mg | ORAL_TABLET | Freq: Every day | ORAL | Status: DC

## 2012-08-17 NOTE — Telephone Encounter (Signed)
Rx called in to pharmacy - pt is getting #90. Called in 90 with no refills.

## 2012-08-28 ENCOUNTER — Ambulatory Visit: Payer: Self-pay

## 2012-09-15 ENCOUNTER — Other Ambulatory Visit: Payer: Self-pay | Admitting: *Deleted

## 2012-09-15 DIAGNOSIS — E119 Type 2 diabetes mellitus without complications: Secondary | ICD-10-CM

## 2012-09-15 NOTE — Telephone Encounter (Signed)
Pt states she is now taking 23 units at bedtime. Please adjust the dose.    Today cbg was 270 because she is out of insulin for a week.

## 2012-09-15 NOTE — Telephone Encounter (Signed)
Per pharmacy directions need to change to cover max dose of lantus that you would want pt to have, pt is completely out of insulin. Please advise asap

## 2012-09-16 MED ORDER — INSULIN GLARGINE 100 UNIT/ML ~~LOC~~ SOLN: SUBCUTANEOUS | Status: DC

## 2012-09-18 NOTE — Telephone Encounter (Signed)
Called to pharm 

## 2012-10-01 ENCOUNTER — Telehealth: Payer: Self-pay | Admitting: Dietician

## 2012-10-01 DIAGNOSIS — E119 Type 2 diabetes mellitus without complications: Secondary | ICD-10-CM

## 2012-10-01 MED ORDER — INSULIN GLARGINE 100 UNIT/ML ~~LOC~~ SOLN: SUBCUTANEOUS | Status: DC

## 2012-10-01 NOTE — Telephone Encounter (Signed)
Still having trouble getting her insulin [facility] the health department. They don;t have the new instructions for patient to increase her insulin by 1 unit when fasting > 130. They have that she is on 23 units a day. We've given patient samples several times. Called and Left message at [facility] to clarify.   Dr. Dorthula Rue Please send new instructions for lantus to [facility] as patient reports is is now taking 30 units lantus a day and increasing her dose to maintain fasting blood sugars of < 130.  Last A1C was 13.1% in 04/2012. Last office visit was 06/2012.  Will request appointment be made for patient with doctor

## 2012-10-02 NOTE — Telephone Encounter (Signed)
Suggest having patient stop increasing lantus at 35-40 units until she is reevaluated at an appointment. Will ask triage to call to health department

## 2012-10-05 NOTE — Telephone Encounter (Signed)
Agree with the plan.  Patient needs to be seen in [facility] to review her CBG log. Her maintenance dose of insulin would be 0.5-1 units/kg /day. Based on her weight, 35- 40 units is still ok. We started her on low dose to prevent hypoglycemic events and our plans were to slowly up titrate the dose.  Thanks Lupita Leash for your help!  Tiffany [name]

## 2012-10-06 ENCOUNTER — Encounter: Payer: Self-pay | Admitting: Dietician

## 2012-10-07 ENCOUNTER — Telehealth: Payer: Self-pay | Admitting: Dietician

## 2012-10-07 NOTE — Telephone Encounter (Signed)
We discussed targets for fasting blood sugars- < 120 mg/dl. Patient Okay on 30 units, understands not to increase or decrease it more based on blood sugar- just to maintain. Asks that CDE confirm dose with MAP.

## 2012-10-12 ENCOUNTER — Other Ambulatory Visit: Payer: Self-pay | Admitting: Internal Medicine

## 2012-10-20 NOTE — Telephone Encounter (Signed)
Left message for [facility] of patient's lantus dose

## 2012-10-21 ENCOUNTER — Other Ambulatory Visit: Payer: Self-pay | Admitting: *Deleted

## 2012-10-21 DIAGNOSIS — I10 Essential (primary) hypertension: Secondary | ICD-10-CM

## 2012-10-22 MED ORDER — QUINAPRIL HCL 40 MG PO TABS: 40.0000 mg | ORAL_TABLET | Freq: Every day | ORAL | Status: DC

## 2012-10-22 NOTE — Telephone Encounter (Signed)
Rx faxed in.

## 2012-12-03 ENCOUNTER — Other Ambulatory Visit: Payer: Self-pay

## 2013-01-04 ENCOUNTER — Ambulatory Visit (INDEPENDENT_AMBULATORY_CARE_PROVIDER_SITE_OTHER): Payer: No Typology Code available for payment source | Admitting: Internal Medicine

## 2013-01-04 ENCOUNTER — Encounter: Payer: Self-pay | Admitting: Internal Medicine

## 2013-01-04 VITALS — BP 147/97 | HR 101 | Temp 98.4°F | Ht 63.0 in | Wt 175.5 lb

## 2013-01-04 DIAGNOSIS — N898 Other specified noninflammatory disorders of vagina: Secondary | ICD-10-CM

## 2013-01-04 DIAGNOSIS — G4700 Insomnia, unspecified: Secondary | ICD-10-CM

## 2013-01-04 DIAGNOSIS — E1165 Type 2 diabetes mellitus with hyperglycemia: Secondary | ICD-10-CM

## 2013-01-04 DIAGNOSIS — Z0000 Encounter for general adult medical examination without abnormal findings: Secondary | ICD-10-CM

## 2013-01-04 DIAGNOSIS — E119 Type 2 diabetes mellitus without complications: Secondary | ICD-10-CM

## 2013-01-04 DIAGNOSIS — N9489 Other specified conditions associated with female genital organs and menstrual cycle: Secondary | ICD-10-CM

## 2013-01-04 DIAGNOSIS — IMO0001 Reserved for inherently not codable concepts without codable children: Secondary | ICD-10-CM

## 2013-01-04 DIAGNOSIS — IMO0002 Reserved for concepts with insufficient information to code with codable children: Secondary | ICD-10-CM

## 2013-01-04 DIAGNOSIS — G4730 Sleep apnea, unspecified: Secondary | ICD-10-CM

## 2013-01-04 DIAGNOSIS — M1990 Unspecified osteoarthritis, unspecified site: Secondary | ICD-10-CM

## 2013-01-04 DIAGNOSIS — E785 Hyperlipidemia, unspecified: Secondary | ICD-10-CM

## 2013-01-04 DIAGNOSIS — I10 Essential (primary) hypertension: Secondary | ICD-10-CM

## 2013-01-04 LAB — LIPID PANEL
Cholesterol: 230 mg/dL — ABNORMAL HIGH (ref 0–200)
HDL: 53 mg/dL (ref >39)
Total CHOL/HDL Ratio: 4.3 Ratio
Triglycerides: 94 mg/dL (ref <150)

## 2013-01-04 LAB — GLUCOSE, CAPILLARY: Glucose-Capillary: 179 mg/dL — ABNORMAL HIGH (ref 70–99)

## 2013-01-04 LAB — POCT GLYCOSYLATED HEMOGLOBIN (HGB A1C): Hemoglobin A1C: 7.8

## 2013-01-04 MED ORDER — QUINAPRIL HCL 40 MG PO TABS: 40.0000 mg | ORAL_TABLET | Freq: Every day | ORAL | Status: DC

## 2013-01-04 MED ORDER — INSULIN GLARGINE 100 UNIT/ML ~~LOC~~ SOLN: SUBCUTANEOUS | Status: DC

## 2013-01-04 MED ORDER — ZOLPIDEM TARTRATE 5 MG PO TABS: ORAL_TABLET | ORAL | Status: DC

## 2013-01-04 NOTE — Assessment & Plan Note (Addendum)
Pt c/o of chronic lower back pain.  The pain is worse on some days that others.  MRI on 09/23/11 shows slight progression of the degenerative changes L1-2, L4-5 and L5-S1 with most notable change at the L5-S1 level.  She has tried gabapentin, amitryptline, cymbalta, and lidocaine patches without much improvement.  She received a steroid injection from sports medicine in August 2013.  -will refer to sports medicine

## 2013-01-04 NOTE — Patient Instructions (Addendum)
Return to clinic in 3 months  1. Please continue your lantus at 30 units bedtime and your glimepiride 4mg  before breakfast 2. We will refer you to sports medicine for your knee pain 3. We will refer you to OB/Gyn for f/u of your gynecological issues

## 2013-01-04 NOTE — Assessment & Plan Note (Signed)
Retinal exam 07/2012 shows no diabetic retinopathy  -referral to Ob/Gyn for pap smear/bladder prolapse -continue yearly retinal exams

## 2013-01-04 NOTE — Progress Notes (Signed)
Patient ID: Tiffany [name], female   DOB: 08/02/1950, 62 y.o.   MRN: [id number]           HPI: [name] is a 62 y.o. AAF here for a f/u visit.  Her last visit was on 07/13/2012.  She has a PMH of poorly controlled DM type 2, HTN, hyperlipidemia, and chronic back pain.  For DM she is on lantus 30 units and glimepiride 4mg  daily.  She brought her meter with her today and it showed an average of about 140--the lowest reading was 71.  Her HA1C today was 7.8.  She is mainly checking her bs before lunch and dinner.  She is much improved from her previous HA1C of 13.    Chronic lower back pain is continuing to be a problem for her.  She hasn't gotten much relief from any modality.  She did receive injections from sports medicine in the past.    She has HTN which she takes accupril and norvasc.  BP initially today was 147/97.  I rechecked and it was 140/90.  She admits to being out of her accupril for about a week, but no other symptoms.  She also c/o of bladder prolapse which only occurs when she is constipated and is straining.  She also notes a decreased urine stream with these episodes.    She has a h/o OSA and reports wearing her CPAP 2-3x per week.     Past Medical History  Diagnosis Date  . Diabetes mellitus   . Hypertension   . Hyperlipidemia    Current Outpatient Prescriptions  Medication Sig Dispense Refill  . amitriptyline (ELAVIL) 75 MG tablet Take 1 tablet (75 mg total) by mouth at bedtime.  30 tablet  3  . amLODipine (NORVASC) 5 MG tablet Take 1 tablet (5 mg total) by mouth daily.  30 tablet  3  . Blood Glucose Monitoring Suppl DEVI Use to check blood sugar as directed up to 3 times a day  1 each  0  . cyclobenzaprine (FLEXERIL) 10 MG tablet Take 1 tablet (10 mg total) by mouth 2 (two) times daily as needed for muscle spasms. 1 as needed for severe muscle pain  180 tablet  3  . diclofenac sodium (VOLTAREN) 1 % GEL aaa tid-qid  100 g  3  . glimepiride (AMARYL) 4 MG tablet  Take 1 tablet (4 mg total) by mouth daily before breakfast.  60 tablet  11  . glucose blood test strip Use to check blood sugar as directed up to 3 times a day  100 each  12  . insulin glargine (LANTUS) 100 UNIT/ML injection Take 30 units subcutaneously at bedtime.  10 mL  3  . Lancets 30G MISC Use to check blood sugar as directed up to 3 times a day  100 each  5  . quinapril (ACCUPRIL) 40 MG tablet Take 1 tablet (40 mg total) by mouth daily.  90 tablet  3  . Ranitidine HCl (ZANTAC PO) Take by mouth daily.      Marland Kitchen zolpidem (AMBIEN) 5 MG tablet Take 1 tab at bedtime as needed  30 tablet  0  . fluticasone (FLONASE) 50 MCG/ACT nasal spray Place 2 sprays into the nose daily.  16 g  11  . meloxicam (MOBIC) 15 MG tablet 1 pill daily for 2 weeks then as needed thereafter.  30 tablet  2   No current facility-administered medications for this visit.   Family History  Problem Relation  Age of Onset  . Colon cancer Neg Hx   . Colon polyps Neg Hx   . Rectal cancer Neg Hx   . Stomach cancer Neg Hx    History   Social History  . Marital Status: Single    Spouse Name: N/A    Number of Children: N/A  . Years of Education: 8   Social History Main Topics  . Smoking status: Former Smoker    Quit date: 08/02/1990  . Smokeless tobacco: Never Used  . Alcohol Use: No  . Drug Use: No  . Sexually Active: None   Other Topics Concern  . None   Social History Narrative  . None    Review of Systems: Constitutional: Denies fever, chills, diaphoresis, appetite change and fatigue.  Respiratory: Denies SOB, DOE, cough, chest tightness, and wheezing.  Cardiovascular: No chest pain, palpitations and leg swelling.  Gastrointestinal: No abdominal pain, nausea, vomiting, bloody stools Genitourinary: No dysuria, frequency, hematuria, or flank pain.  Musculoskeletal: No myalgias, joint swelling, arthralgias    Objective:  Physical Exam: Filed Vitals:   01/04/13 1429  BP: 147/97  Pulse: 101  Temp: 98.4  F (36.9 C)  TempSrc: Oral  Height: 5\' 3"  (1.6 m)  Weight: 175 lb 8 oz (79.606 kg)  SpO2: 100%   General: Well nourished. No acute distress.  Lungs: CTA bilaterally. Heart: RRR; no extra sounds or murmurs  Abdomen: Non-distended, normal BS, soft, nontender; no hepatosplenomegaly  Extremities: No pedal edema. No joint swelling or tenderness. Neurologic: Alert and oriented x3. No obvious neurologic deficits.  Assessment & Plan:  I have discussed my assessment and plan with Dr. Meredith Pel as detailed under problem based charting.

## 2013-01-04 NOTE — Assessment & Plan Note (Addendum)
Pt's last LDL was 143 in 09/23/2011.  Pt was previously on a statin but had some associated myalgias?    -recheck lipid panel today -ask pt on next visit if able to take a statin to decrease her LDL if high today

## 2013-01-04 NOTE — Assessment & Plan Note (Signed)
Pt. Reports using a CPAP machine 2-3x week  -continue current regimen

## 2013-01-04 NOTE — Assessment & Plan Note (Signed)
Pt still c/o insomnia and takes [place] on occasion.    -refilled ambien 5mg  at bedtime PRN for insomnia

## 2013-01-04 NOTE — Assessment & Plan Note (Signed)
Pt c/o vaginal dryness and bladder prolapse during strenuous defecation.  She may also need a pap smear depending on whether she still has a cervix-she had a hysterectomy during her twenties due to fibroids.  -referral made to Ob/Gyn

## 2013-01-04 NOTE — Assessment & Plan Note (Addendum)
BP Readings from Last 3 Encounters:  01/04/13 147/97  07/22/12 118/80  07/13/12 152/99    Lab Results  Component Value Date   NA 132* 05/12/2012   K 4.9 05/12/2012   CREATININE 0.59 05/12/2012    Assessment: Blood pressure control: mildly elevated Comments: on norvasc 5mg  and accupril 40mg   Plan: Medications:  continue current medications Other plans: refilled accupril

## 2013-01-04 NOTE — Assessment & Plan Note (Signed)
Lab Results  Component Value Date   HGBA1C 7.8 01/04/2013   HGBA1C 13.1 05/12/2012   HGBA1C 10.1 11/04/2011     Assessment: Diabetes control: fair control Progress toward A1C goal:  improved Comments: Pt on 30 units of lantus at bedtime and 4mg  glimepiride before breakfast  Plan: Medications:  continue current medications Home glucose monitoring: Frequency: 2 times a day Timing: before dinner;before breakfast Instruction/counseling given: reminded to bring blood glucose meter & log to each visit Other plans: Pt HA1C today is 7.8 which is much improved from her previous reading of 13.  I congratulated her on this.  Will f/u in 3 months to recheck [facility] and see if we need to increase her lantus.  I did not at this visit due to her having a low BS of 71 on her meter print out and her much improved HA1C.

## 2013-01-05 ENCOUNTER — Encounter: Payer: Self-pay | Admitting: Internal Medicine

## 2013-01-05 LAB — COMPLETE METABOLIC PANEL WITH GFR
Alkaline Phosphatase: 81 U/L (ref 39–117)
BUN: 10 mg/dL (ref 6–23)
Creat: 0.64 mg/dL (ref 0.50–1.10)
GFR, Est African American: >89 mL/min
GFR, Est Non African American: >89 mL/min
Glucose, Bld: 143 mg/dL — ABNORMAL HIGH (ref 70–99)
Sodium: 139 mEq/L (ref 135–145)
Total Bilirubin: 0.3 mg/dL (ref 0.3–1.2)

## 2013-01-05 NOTE — Progress Notes (Signed)
I saw and evaluated the patient.  I personally confirmed the key portions of the history and exam documented by Dr. Gill and I reviewed pertinent patient test results.  The assessment, diagnosis, and plan were formulated together and I agree with the documentation in the resident's note. 

## 2013-01-08 ENCOUNTER — Ambulatory Visit: Payer: No Typology Code available for payment source

## 2013-01-11 ENCOUNTER — Other Ambulatory Visit: Payer: Self-pay | Admitting: Dietician

## 2013-01-11 DIAGNOSIS — E1165 Type 2 diabetes mellitus with hyperglycemia: Secondary | ICD-10-CM

## 2013-01-12 ENCOUNTER — Other Ambulatory Visit: Payer: Self-pay | Admitting: Internal Medicine

## 2013-01-13 ENCOUNTER — Ambulatory Visit: Payer: No Typology Code available for payment source

## 2013-01-14 ENCOUNTER — Encounter: Payer: Self-pay | Admitting: Obstetrics & Gynecology

## 2013-01-14 NOTE — Addendum Note (Signed)
Addended by: Marrian Salvage on: 01/14/2013 03:47 PM   Modules accepted: Orders

## 2013-01-21 ENCOUNTER — Ambulatory Visit: Payer: No Typology Code available for payment source | Admitting: Sports Medicine

## 2013-02-01 ENCOUNTER — Ambulatory Visit: Payer: No Typology Code available for payment source | Admitting: Sports Medicine

## 2013-02-01 NOTE — Addendum Note (Signed)
Addended by: Neomia Dear on: 02/01/2013 08:15 PM   Modules accepted: Orders

## 2013-02-10 ENCOUNTER — Other Ambulatory Visit: Payer: Self-pay | Admitting: *Deleted

## 2013-02-10 ENCOUNTER — Ambulatory Visit (INDEPENDENT_AMBULATORY_CARE_PROVIDER_SITE_OTHER): Payer: No Typology Code available for payment source | Admitting: Sports Medicine

## 2013-02-10 VITALS — BP 144/95 | Ht 63.0 in | Wt 170.0 lb

## 2013-02-10 DIAGNOSIS — M25561 Pain in right knee: Secondary | ICD-10-CM

## 2013-02-10 DIAGNOSIS — M1710 Unilateral primary osteoarthritis, unspecified knee: Secondary | ICD-10-CM

## 2013-02-10 DIAGNOSIS — IMO0002 Reserved for concepts with insufficient information to code with codable children: Secondary | ICD-10-CM

## 2013-02-10 DIAGNOSIS — M25569 Pain in unspecified knee: Secondary | ICD-10-CM

## 2013-02-10 DIAGNOSIS — M1990 Unspecified osteoarthritis, unspecified site: Secondary | ICD-10-CM

## 2013-02-10 MED ORDER — METHYLPREDNISOLONE ACETATE 80 MG/ML IJ SUSP
80.0000 mg | Freq: Once | INTRAMUSCULAR | Status: AC
  Administered 2013-02-10: 80 mg via INTRAMUSCULAR

## 2013-02-10 MED ORDER — KETOROLAC TROMETHAMINE 60 MG/2ML IM SOLN
60.0000 mg | Freq: Once | INTRAMUSCULAR | Status: AC
  Administered 2013-02-10: 60 mg via INTRAMUSCULAR

## 2013-02-10 NOTE — Progress Notes (Signed)
  Subjective:    Patient ID: Tiffany [name], female    DOB: [birth year], 62 y.o.   MRN: [id number]  HPI Ms. Stauder is a 62 year old female with HTN, T2DM, and DJD with chronic back pain who comes in for evaluation of bilateral knee pain for the past 3-4 months. She does have a history of left knee surgery x2 in the 1980s, at which time she had some torn cartilage and ligaments. However, for the past few months, she has had left knee pain and right knee instability. She describes a dull, aching, 6/10 pain in the left anterior/lateral knee and a pulling sensation in the back of her knee. She has had some catching and locking, but does not feel like that knee is giving out on her. On the right side, she has had mild pain in the anterior patellar region as well as posterior knee pain. She occasionally has a popping sensation with a sharp pain, and feels like her knee is giving out in the back. She takes flexeril for her back which has helped a little with her knees. She also uses heat/ice which helps, but neither tylenol nor aleve have helped. She does wear a knee sleeve on the right side which makes her feel more supported. She has not done any physical therapy. She did have 1 or 2 steroid shots in her knees several years ago, but it made her blood sugars increase and she is now on insulin.    Review of Systems     Objective:   Physical Exam  Constitutional: She appears well-developed and well-nourished.  HENT:  Head: Normocephalic and atraumatic.  Eyes: Conjunctivae are normal.  Neurological: She is alert.  Psychiatric: She has a normal mood and affect. Her behavior is normal.   MSK: Bilateral knee effusions.  + Tenderness to palpation on left lateral joint line in middle/posterior areas, and right anterior lateral joint line. Left medial patellar facet pain, right bilateral patellar facet pain. Negative patellar grind. Flexion bilaterally to 120 degrees and 1-2 degrees of hyperextension  bilaterally 4/5 strength left knee extension, 5-/5 strength right knee extension. 5-/5 bilateral knee flexion. 5/5 hip flexion/extension. + pain with bilateral McMurrays in lateral joint lines. No pain illicited with Thessaly's bilaterally.  ACL/PCL wnl with anterior/posterior drawer testing and Lachman's. No MCL/LCL laxity with varus/valgus stress at 0 and 20 degrees. Visible bilateral quadriceps atrophy with R > L, right calf atrophy    MRI of bilateral knees from 07/18/11 reviewed - show tricompartmental degenerative disease of both knees and lateral meniscal tears. Right quadriceps tendinopathy.  Assessment & Plan:   62 year old female with bilateral osteoarthritis of her knees and degenerative meniscal tears - Will obtain plain films of bilateral knees to evaluate degree of OA - Will give home exercises to strengthen quads [employer] sets and straight leg raises) - 80 mg IM depomedrol and 60 mg IM toradol as she has received this before without it causing too high of a blood sugar increase  - F/U in 4 weeks. Consider surgical options based on response to today's treatment and xray results F/U in 1 month for re-evaluation  Solon Augusta, PGY-3

## 2013-02-10 NOTE — Telephone Encounter (Signed)
Pt being seen at [facility] today

## 2013-02-11 MED ORDER — CYCLOBENZAPRINE HCL 10 MG PO TABS: 10.0000 mg | ORAL_TABLET | Freq: Every day | ORAL | Status: DC | PRN

## 2013-02-12 ENCOUNTER — Ambulatory Visit
Admission: RE | Admit: 2013-02-12 | Discharge: 2013-02-12 | Disposition: A | Discharge status: Home / Self Care | Payer: No Typology Code available for payment source | Source: Ambulatory Visit | Attending: Sports Medicine | Admitting: Sports Medicine

## 2013-02-12 ENCOUNTER — Other Ambulatory Visit: Payer: Self-pay | Admitting: Sports Medicine

## 2013-02-12 DIAGNOSIS — M25561 Pain in right knee: Secondary | ICD-10-CM

## 2013-02-12 NOTE — Telephone Encounter (Signed)
Rx faxed in.

## 2013-03-08 ENCOUNTER — Ambulatory Visit: Payer: Self-pay | Admitting: Sports Medicine

## 2013-03-08 ENCOUNTER — Encounter: Payer: Self-pay | Admitting: Obstetrics & Gynecology

## 2013-03-10 ENCOUNTER — Encounter: Payer: Self-pay | Admitting: Sports Medicine

## 2013-03-10 ENCOUNTER — Ambulatory Visit (INDEPENDENT_AMBULATORY_CARE_PROVIDER_SITE_OTHER): Payer: No Typology Code available for payment source | Admitting: Sports Medicine

## 2013-03-10 VITALS — BP 128/84 | Ht 63.0 in | Wt 170.0 lb

## 2013-03-10 DIAGNOSIS — M25569 Pain in unspecified knee: Secondary | ICD-10-CM

## 2013-03-10 DIAGNOSIS — M25561 Pain in right knee: Secondary | ICD-10-CM

## 2013-03-10 NOTE — Progress Notes (Signed)
  Subjective:    Patient ID: Tiffany [name], female    DOB: 1950-08-15, 62 y.o.   MRN: [id number]  HPI Patient comes in today for followup on bilateral knee pain. Recent x-rays show some mild medial compartmental DJD in each knee. MRI scans of each knee done on 07/24/2011 showed bilateral lateral meniscal tears. She is status post left knee arthroscopy x2, both done by Dr.Aluisio. First surgery was in the late 70s or early 80s and the second surgery was 10 years ago. She takes occasional Aleve or Tylenol as needed for pain. She has instability in each knee which will cause her to fall on occasion. No swelling. Some catching and popping in the knees but no true locking. She has a knee wrap that she wears on the left knee but she states that it is "flimsy". We did discuss the possibility of intra-articular cortisone injections at her last visit but she does not want to do this as she has had significant hyperglycemia from these injections in the past. Therefore, we elected to inject her with a simple 80 mg IM Depo-Medrol shot which did help her pain somewhat for about 2 weeks. She has not had formal physical therapy but we did show her some quad strengthening exercises at her last visit.    Review of Systems     Objective:   Physical Exam Well-developed, no acute distress  Examination of each knee shows range of motion from 0-120. 1+ boggy synovitis. She is tender to palpation along the lateral joint lines of both left and right knees. Pain but no popping with McMurray's bilaterally. 4/5 strength with knee extension. Knees are grossly stable to ligamentous exam. Walking with a slight limp.       Assessment & Plan:  Chronic bilateral knee pain secondary to mild DJD with MRI evidence of bilateral lateral meniscal tears  Patient has failed conservative treatment to date. She has had good success with arthroscopy in regards to the left knee in the past. It sounds like she will be getting Medicare  sometime in the next couple of months. Once that happens I will be able to refer her to orthopedics. She may want to see Dr. Despina Hick again. In the meantime we will try a neoprene knee sleeve on the left knee and have her followup in one month. If it is helpful we will consider one for the right knee as well. I think she would benefit from formal physical therapy. She will think about this. Continue with over-the-counter Aleve or Tylenol when necessary. Of note, at her followup visit she would like to also discuss her left hip and low back issues.

## 2013-03-31 ENCOUNTER — Ambulatory Visit (INDEPENDENT_AMBULATORY_CARE_PROVIDER_SITE_OTHER): Payer: Medicare Other | Admitting: Sports Medicine

## 2013-03-31 ENCOUNTER — Encounter: Payer: Self-pay | Admitting: Sports Medicine

## 2013-03-31 VITALS — BP 139/90 | Ht 63.0 in | Wt 170.0 lb

## 2013-03-31 DIAGNOSIS — M25569 Pain in unspecified knee: Secondary | ICD-10-CM

## 2013-03-31 DIAGNOSIS — M25561 Pain in right knee: Secondary | ICD-10-CM

## 2013-03-31 MED ORDER — METHYLPREDNISOLONE ACETATE 80 MG/ML IJ SUSP
80.0000 mg | Freq: Once | INTRAMUSCULAR | Status: AC
  Administered 2013-03-31: 80 mg via INTRAMUSCULAR

## 2013-03-31 MED ORDER — MELOXICAM 15 MG PO TABS: 15.0000 mg | ORAL_TABLET | Freq: Every day | ORAL | Status: DC | PRN

## 2013-03-31 NOTE — Progress Notes (Signed)
  Subjective:    Patient ID: Tiffany [name], female    DOB: [birth year], 62 y.o.   MRN: [id number]  HPI Patient comes in today for followup on bilateral knee pain. Please see the office note from 03/10/2013 for details regarding history. In short, she has symptomatic bilateral lateral meniscal tears. She has recently obtained Medicare and we had discussed the possibility of a referral back to Dr.Aluisio.   Review of Systems     Objective:   Physical Exam Well-developed, well-nourished. No acute distress  Examination of each knee shows full range of motion with no effusion. She is tender to palpation along the lateral joint lines bilaterally. Pain but no popping with McMurray's maneuver bilaterally. Neurovascularly intact distally. Walking with a slight limp.       Assessment & Plan:  Bilateral knee pain secondary to lateral meniscal tears  I had a long talk with the patient regarding her treatment options. She has a knee sleeve to wear on the left knee and has a double upright brace for the right knee. She has had Mobic in the past with good results but is concerned about renal insufficiency with chronic use. I reassured her that she has good kidney function and that if she uses the medicine on a when necessary basis then she should be safe. She has tried Ultram in the past without good results. She is concerned about hyperglycemia with intra-articular cortisone injections. We've injected her previously with 80 mg of Depo-Medrol IM and she seemed to respond fairly well to this. She would like to go ahead and repeat the IM injection today. I explained to her that she may ultimately need to see Dr. Lequita Halt again for his input but she wants to wait on this for now.

## 2013-04-14 ENCOUNTER — Ambulatory Visit (INDEPENDENT_AMBULATORY_CARE_PROVIDER_SITE_OTHER): Payer: Medicare Other | Admitting: Obstetrics & Gynecology

## 2013-04-14 ENCOUNTER — Encounter: Payer: Self-pay | Admitting: Obstetrics & Gynecology

## 2013-04-14 VITALS — BP 149/88 | HR 97 | Temp 97.4°F | Resp 20 | Ht 63.0 in | Wt 171.8 lb

## 2013-04-14 DIAGNOSIS — N8111 Cystocele, midline: Secondary | ICD-10-CM

## 2013-04-14 DIAGNOSIS — N8110 Cystocele, unspecified: Secondary | ICD-10-CM

## 2013-04-14 DIAGNOSIS — N993 Prolapse of vaginal vault after hysterectomy: Secondary | ICD-10-CM

## 2013-04-14 MED ORDER — ESTRADIOL 0.1 MG/GM VA CREA: 1.0000 | TOPICAL_CREAM | VAGINAL | Status: DC

## 2013-04-14 NOTE — Progress Notes (Signed)
Subjective:     Patient ID: Tiffany [name], female   DOB: 1950-06-23, 62 y.o.   MRN: [id number]  HPI Pt presents with a complaints of feeling a bulge in her vagina.  She is s/p SVD x2 both uncomplicated.  She denies leakage of urine.  She reports that the bulge does not bother her but, she is worried about what it could be.  She reports heavy lifting previously but, not any longer. Pt not sexually active.  She is s/p hyst >20years prev  Past Medical History  Diagnosis Date  . Diabetes mellitus   . Hypertension   . Hyperlipidemia   . Degenerative joint disease   . Sleep apnea    Past Surgical History  Procedure Laterality Date  . Lumbar laminectomy  1989  . Knee arthroscopy      left x 2  . Abdominal hysterectomy  1976   Current Outpatient Prescriptions on File Prior to Visit  Medication Sig Dispense Refill  . amitriptyline (ELAVIL) 75 MG tablet Take 1 tablet (75 mg total) by mouth at bedtime.  30 tablet  3  . amLODipine (NORVASC) 5 MG tablet Take 1 tablet (5 mg total) by mouth daily.  30 tablet  3  . Blood Glucose Monitoring Suppl DEVI Use to check blood sugar as directed up to 3 times a day  1 each  0  . diclofenac sodium (VOLTAREN) 1 % GEL aaa tid-qid  100 g  3  . glimepiride (AMARYL) 4 MG tablet Take 1 tablet (4 mg total) by mouth daily before breakfast.  60 tablet  11  . glucose blood test strip Use to check blood sugar as directed up to 3 times a day  100 each  12  . insulin glargine (LANTUS) 100 UNIT/ML injection Take 30 units subcutaneously at bedtime.  10 mL  3  . Lancets 30G MISC Use to check blood sugar as directed up to 3 times a day  100 each  5  . meloxicam (MOBIC) 15 MG tablet Take 1 tablet (15 mg total) by mouth daily as needed for pain. Take with food  40 tablet  2  . quinapril (ACCUPRIL) 40 MG tablet Take 1 tablet (40 mg total) by mouth daily.  90 tablet  3  . Ranitidine HCl (ZANTAC PO) Take by mouth daily.      Marland Kitchen zolpidem (AMBIEN) 5 MG tablet Take 1 tab at bedtime as  needed  30 tablet  0  . cyclobenzaprine (FLEXERIL) 10 MG tablet Take 1 tablet (10 mg total) by mouth daily as needed for muscle spasms. 1 as needed for severe muscle pain  30 tablet  3  . fluticasone (FLONASE) 50 MCG/ACT nasal spray Place 2 sprays into the nose daily.  16 g  11   No current facility-administered medications on file prior to visit.   Allergies  Allergen Reactions  . Metformin And Related     Severe nausea and vomiting  . Aspirin Nausea And Vomiting    Can take EC asa  . Contrast Media [Iodinated Diagnostic Agents] Nausea And Vomiting  . Shellfish Allergy Nausea And Vomiting   History   Social History  . Marital Status: Single    Spouse Name: N/A    Number of Children: N/A  . Years of Education: 8   Occupational History  . Not on file.   Social History Main Topics  . Smoking status: Former Smoker    Quit date: 08/02/1990  . Smokeless tobacco: Never Used  .  Alcohol Use: No  . Drug Use: No  . Sexual Activity: No   Other Topics Concern  . Not on file   Social History Narrative  . No narrative on file     Review of Systems     Objective:   Physical Exam BP 149/88  Pulse 97  Temp(Src) 97.4 F (36.3 C) (Oral)  Resp 20  Ht 5\' 3"  (1.6 m)  Wt 171 lb 12.8 oz (77.928 kg)  BMI 30.44 kg/m2 Pt in NAD  GU: EGBUS: no lesions Vagina: no blood in vault; vault with bladder is prolapsed to the introitus Grade I; atrophic vaginal mucosa Cervix/ Uterus: surgically absent Adnexa: no masses; non tender         Assessment:     Vaginal vault prolapse with Grade I cystocele- reviewed with pt treatment options including observation, pessary or surgery.  Pt opts for conservative measures.  Will begin topical  EES. Reviewed risks of topical EES         Plan:     Estrace 1/2 applicatiorful 3x/ week F/u in 3 months or sooner prn

## 2013-04-15 MED ORDER — ESTRADIOL 0.1 MG/GM VA CREA: 1.0000 | TOPICAL_CREAM | VAGINAL | Status: DC

## 2013-04-19 ENCOUNTER — Ambulatory Visit (INDEPENDENT_AMBULATORY_CARE_PROVIDER_SITE_OTHER): Payer: Medicare Other | Admitting: Sports Medicine

## 2013-04-19 ENCOUNTER — Encounter: Payer: Self-pay | Admitting: Sports Medicine

## 2013-04-19 VITALS — BP 130/86 | Ht 63.0 in | Wt 170.0 lb

## 2013-04-19 DIAGNOSIS — M25569 Pain in unspecified knee: Secondary | ICD-10-CM

## 2013-04-19 DIAGNOSIS — M25561 Pain in right knee: Secondary | ICD-10-CM

## 2013-04-20 NOTE — Progress Notes (Signed)
  Subjective:    Patient ID: Tiffany [name], female    DOB: 02/29/1951, 62 y.o.   MRN: [id number]  HPI Patient comes in today for followup on bilateral knee pain. Overall, she is feeling a little better. She is taking meloxicam on an as-needed basis. Ultram causes her to get migraine headaches. Right now her symptoms are tolerable. She feels like the IM Depo-Medrol injection at last visit has been helpful. We did this in lieu of an intra-articular injection due to her concerns about hyperglycemia which she apparently experienced with prior intra-articular injections.    Review of Systems     Objective:   Physical Exam Well-developed, well-nourished. No acute distress. Sitting comfortable in the exam room  Examination of each knee shows full range of motion. No effusion. She is still tender to palpation along the lateral joint lines bilaterally. Pain but no popping with McMurray's. Neurovascular intact distally. Walking with a minimal limp.       Assessment & Plan:  Bilateral knee pain secondary to lateral meniscus tears  Again, I explained to the patient that she may ultimately need to see Dr. Lequita Halt to discuss arthroscopy. Right now her symptoms are tolerable on when necessary meloxicam. She also has a couple of knee braces. She will followup when necessary.

## 2013-05-06 ENCOUNTER — Other Ambulatory Visit: Payer: Self-pay | Admitting: *Deleted

## 2013-05-06 DIAGNOSIS — I10 Essential (primary) hypertension: Secondary | ICD-10-CM

## 2013-05-06 MED ORDER — AMLODIPINE BESYLATE 5 MG PO TABS: 5.0000 mg | ORAL_TABLET | Freq: Every day | ORAL | Status: DC

## 2013-05-06 NOTE — Telephone Encounter (Signed)
Patient needs an appointment.  Thanks

## 2013-05-07 NOTE — Telephone Encounter (Signed)
rx faxed in 

## 2013-05-10 ENCOUNTER — Other Ambulatory Visit: Payer: Self-pay | Admitting: *Deleted

## 2013-05-10 DIAGNOSIS — I10 Essential (primary) hypertension: Secondary | ICD-10-CM

## 2013-05-10 DIAGNOSIS — M549 Dorsalgia, unspecified: Secondary | ICD-10-CM

## 2013-05-10 MED ORDER — QUINAPRIL HCL 40 MG PO TABS: 40.0000 mg | ORAL_TABLET | Freq: Every day | ORAL | Status: DC

## 2013-05-12 NOTE — Telephone Encounter (Signed)
Rx called in 

## 2013-05-13 ENCOUNTER — Other Ambulatory Visit: Payer: Self-pay | Admitting: *Deleted

## 2013-05-13 DIAGNOSIS — G4700 Insomnia, unspecified: Secondary | ICD-10-CM

## 2013-05-13 DIAGNOSIS — E119 Type 2 diabetes mellitus without complications: Secondary | ICD-10-CM

## 2013-05-13 DIAGNOSIS — M549 Dorsalgia, unspecified: Secondary | ICD-10-CM

## 2013-05-13 NOTE — Telephone Encounter (Signed)
Pt wants lantus pens and also needs needles for the pens.

## 2013-05-14 ENCOUNTER — Other Ambulatory Visit: Payer: Self-pay | Admitting: Internal Medicine

## 2013-05-14 MED ORDER — ZOLPIDEM TARTRATE 5 MG PO TABS: ORAL_TABLET | ORAL | Status: DC

## 2013-05-14 MED ORDER — INSULIN GLARGINE 100 UNIT/ML SOLOSTAR PEN: 30.0000 [IU] | PEN_INJECTOR | Freq: Every day | SUBCUTANEOUS | Status: DC

## 2013-05-14 MED ORDER — INSULIN PEN NEEDLE 29G X 12.7MM MISC: 100.0000 | Freq: Every day | Status: DC

## 2013-05-14 MED ORDER — AMITRIPTYLINE HCL 75 MG PO TABS: 75.0000 mg | ORAL_TABLET | Freq: Every day | ORAL | Status: DC

## 2013-05-17 ENCOUNTER — Other Ambulatory Visit: Payer: Self-pay | Admitting: *Deleted

## 2013-05-17 DIAGNOSIS — E119 Type 2 diabetes mellitus without complications: Secondary | ICD-10-CM

## 2013-05-17 DIAGNOSIS — M1990 Unspecified osteoarthritis, unspecified site: Secondary | ICD-10-CM

## 2013-05-17 MED ORDER — LANCETS 30G MISC: Status: DC

## 2013-05-17 NOTE — Telephone Encounter (Signed)
Rx called in 

## 2013-05-18 MED ORDER — CYCLOBENZAPRINE HCL 10 MG PO TABS: 10.0000 mg | ORAL_TABLET | Freq: Every evening | ORAL | Status: DC | PRN

## 2013-06-13 ENCOUNTER — Other Ambulatory Visit: Payer: Self-pay | Admitting: Internal Medicine

## 2013-06-16 ENCOUNTER — Other Ambulatory Visit: Payer: Self-pay | Admitting: *Deleted

## 2013-06-16 DIAGNOSIS — E119 Type 2 diabetes mellitus without complications: Secondary | ICD-10-CM

## 2013-06-16 MED ORDER — LANCETS 30G MISC: Status: DC

## 2013-06-16 MED ORDER — GLUCOSE BLOOD VI STRP: ORAL_STRIP | Status: DC

## 2013-06-18 NOTE — Telephone Encounter (Signed)
Faxed copies

## 2013-07-10 ENCOUNTER — Other Ambulatory Visit: Payer: Self-pay | Admitting: Internal Medicine

## 2013-07-12 NOTE — Telephone Encounter (Signed)
Please clarify which pharmacy pt would like amlodipine sent to--she has [facility] and [facility].  Thanks.

## 2013-07-14 NOTE — Telephone Encounter (Signed)
Called [facility] 

## 2013-07-14 NOTE — Telephone Encounter (Signed)
[facility]

## 2013-07-14 NOTE — Telephone Encounter (Signed)
Unable to electronically prescribe-please phone in. Thanks.

## 2013-07-17 ENCOUNTER — Other Ambulatory Visit: Payer: Self-pay | Admitting: Internal Medicine

## 2013-07-19 ENCOUNTER — Other Ambulatory Visit: Payer: Self-pay | Admitting: *Deleted

## 2013-07-19 DIAGNOSIS — G4700 Insomnia, unspecified: Secondary | ICD-10-CM

## 2013-07-20 MED ORDER — ZOLPIDEM TARTRATE 5 MG PO TABS
ORAL_TABLET | ORAL | Status: DC
Start: ? — End: 2013-08-23

## 2013-07-20 NOTE — Telephone Encounter (Signed)
Rx called in to pharmacy. 

## 2013-08-12 ENCOUNTER — Other Ambulatory Visit: Payer: Self-pay | Admitting: *Deleted

## 2013-08-12 DIAGNOSIS — E119 Type 2 diabetes mellitus without complications: Secondary | ICD-10-CM

## 2013-08-13 MED ORDER — GLIMEPIRIDE 4 MG PO TABS
4.0000 mg | ORAL_TABLET | Freq: Every day | ORAL | Status: DC
Start: ? — End: 2014-09-26

## 2013-08-23 ENCOUNTER — Encounter: Payer: Self-pay | Admitting: Internal Medicine

## 2013-08-23 ENCOUNTER — Ambulatory Visit (INDEPENDENT_AMBULATORY_CARE_PROVIDER_SITE_OTHER): Payer: Medicare Other | Admitting: Internal Medicine

## 2013-08-23 ENCOUNTER — Other Ambulatory Visit: Payer: Medicare Other

## 2013-08-23 VITALS — BP 131/87 | HR 100 | Temp 99.0°F | Ht 63.0 in | Wt 176.2 lb

## 2013-08-23 DIAGNOSIS — G4700 Insomnia, unspecified: Secondary | ICD-10-CM

## 2013-08-23 DIAGNOSIS — E1165 Type 2 diabetes mellitus with hyperglycemia: Secondary | ICD-10-CM

## 2013-08-23 DIAGNOSIS — IMO0001 Reserved for inherently not codable concepts without codable children: Secondary | ICD-10-CM

## 2013-08-23 DIAGNOSIS — K117 Disturbances of salivary secretion: Secondary | ICD-10-CM

## 2013-08-23 DIAGNOSIS — Z0000 Encounter for general adult medical examination without abnormal findings: Secondary | ICD-10-CM

## 2013-08-23 DIAGNOSIS — N8111 Cystocele, midline: Secondary | ICD-10-CM

## 2013-08-23 DIAGNOSIS — R682 Dry mouth, unspecified: Secondary | ICD-10-CM

## 2013-08-23 DIAGNOSIS — I10 Essential (primary) hypertension: Secondary | ICD-10-CM

## 2013-08-23 DIAGNOSIS — IMO0002 Reserved for concepts with insufficient information to code with codable children: Secondary | ICD-10-CM

## 2013-08-23 LAB — POCT GLYCOSYLATED HEMOGLOBIN (HGB A1C): Hemoglobin A1C: 8.2

## 2013-08-23 LAB — GLUCOSE, CAPILLARY: Glucose-Capillary: 171 mg/dL — ABNORMAL HIGH (ref 70–99)

## 2013-08-23 LAB — HM DIABETES EYE EXAM

## 2013-08-23 MED ORDER — ACCU-CHEK AVIVA DEVI: Status: DC

## 2013-08-23 MED ORDER — ZOLPIDEM TARTRATE 10 MG PO TABS: ORAL_TABLET | ORAL | Status: DC

## 2013-08-23 MED ORDER — GLUCOSE BLOOD VI STRP: ORAL_STRIP | Status: DC

## 2013-08-23 NOTE — Patient Instructions (Signed)
Thank you for your visit today.    Please return to the internal medicine clinic in 3 month(s) or sooner if needed.      Your current medical regimen is effective;  continue present plan and take all medications as prescribed.     I have made the following additions/changes to your medications: None; please check your blood sugars before your 1st meal of the day, before dinner, and at bedtime.   I have sent your medication refills to your pharmacy.    I have made the following referrals for you: None   You need the following test(s) for regular health maintenance: Shingles vaccine (zostavax), mammogram.   Please be sure to bring all of your medications with you to every visit; this includes herbal supplements, vitamins, eye drops, and any over-the-counter medications.    Should you have any questions regarding your medications and/or any new or worsening symptoms, please be sure to call the clinic at [phone].    If you believe that you are suffering from a life threatening condition or one that may result in the loss of limb or function, then you should call 911 or proceed to the nearest Emergency Department.     A healthy lifestyle and preventative care can promote health and wellness.   Maintain regular health, dental, and eye exams.  Eat a healthy diet. Foods like vegetables, fruits, whole grains, low-fat dairy products, and lean protein foods contain the nutrients you need without too many calories. Decrease your intake of foods high in solid fats, added sugars, and salt. Get information about a proper diet from your caregiver, if necessary.  Regular physical exercise is one of the most important things you can do for your health. Most adults should get at least 150 minutes of moderate-intensity exercise (any activity that increases your heart rate and causes you to sweat) each week. In addition, most adults need muscle-strengthening exercises on 2 or more days a  week.   Maintain a healthy weight. The body mass index (BMI) is a screening tool to identify possible weight problems. It provides an estimate of body fat based on height and weight. Your caregiver can help determine your BMI, and can help you achieve or maintain a healthy weight. For adults 20 years and older:  A BMI below 18.5 is considered underweight.  A BMI of 18.5 to 24.9 is normal.  A BMI of 25 to 29.9 is considered overweight.  A BMI of 30 and above is considered obese.

## 2013-08-23 NOTE — Assessment & Plan Note (Addendum)
BP Readings from Last 3 Encounters:  08/23/13 131/87  04/19/13 130/86  04/14/13 149/88    Lab Results  Component Value Date   NA 139 01/04/2013   K 3.6 01/04/2013   CREATININE 0.64 01/04/2013    Assessment: Blood pressure control: controlled Progress toward BP goal:  at goal  Plan: Medications:  continue current medications: accupril 40mg  daily and amlodipine 5mg  daily

## 2013-08-23 NOTE — Assessment & Plan Note (Addendum)
-  will refill ambien at 10mg  and d/c elavil

## 2013-08-23 NOTE — Assessment & Plan Note (Signed)
-  ordered MM -had retinal scan today -check vitamin D -gave prescription for zostavax -pt did not get influenza vaccine this yr

## 2013-08-23 NOTE — Assessment & Plan Note (Addendum)
Lab Results  Component Value Date   HGBA1C 8.2 08/23/2013   HGBA1C 7.8 01/04/2013   HGBA1C 13.1 05/12/2012     Assessment: Diabetes control: fair control Progress toward A1C goal:  deteriorated Comments: pt reports compliance with medications but has also not been controlling her diet as well; she has gained >5 lbs since last visit; she has not been checking her blood sugar recently but reports it sometimes gets to 70's-80's and she eats something; she is unable to tell me when these low's occur but doesn't think these are before breakfast values  Plan: Medications:  continue current medications: lantus 30 units, glimepiride 4mg ; she has taken metformin in the past, but states she "cannot tolerate it" due to the GI side effects Home glucose monitoring: Frequency:  pt not currently checking her blood sugars because her meter broke Timing:  asked her to check her bs before breakfast, before dinner, and at bedtime Instruction/counseling given: reminded to bring blood glucose meter & log to each visit and reminded to bring medications to each visit Self management tools provided:  pt meter broke and will order her a new one Other plans: will increase lantus at next visit and/or will add another agent if her HA1C is not at goal; will do retinal scan today

## 2013-08-23 NOTE — Assessment & Plan Note (Deleted)
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 Pt opts for conservative measures. Will begin topical EES. Reviewed risks of topical EES  Plan:   Estrace 1/2 applicatiorful 3x/ week  F/u in 3 months or sooner prn

## 2013-08-23 NOTE — Assessment & Plan Note (Signed)
Most likely related to elavil; pt uses elavil for sleep but also has [place] on her med list.  I have asked her to discontinue the elavil and take only the [place] for sleep.

## 2013-08-23 NOTE — Progress Notes (Signed)
Patient ID: Tiffany [name], female   DOB: 1950/11/15, 63 y.o.   MRN: [id number]    Subjective:   Patient ID: Tiffany [name] female    DOB: 07/28/1950 63 y.o.    MRN: [id number] Health Maintenance Due: Health Maintenance Due  Topic Date Due  . Mammogram  08/24/2007  . Zostavax  07/11/2011  . Influenza Vaccine  12/25/2012  . Ophthalmology Exam  08/12/2013    ________________________________________________________________  HPI: [name] is a 63 y.o. female here for a routine visit.  Pt has a PMH outlined below.  Please see problem-based charting assessment and plan note for further details of medical issues addressed at today's visit.  PMH: Past Medical History  Diagnosis Date  . Diabetes mellitus   . Hypertension   . Hyperlipidemia   . Degenerative joint disease   . Sleep apnea     Medications: Current Outpatient Prescriptions on File Prior to Visit  Medication Sig Dispense Refill  . amLODipine (NORVASC) 5 MG tablet Take 1 tablet (5 mg total) by mouth daily.  30 tablet  6  . Blood Glucose Monitoring Suppl DEVI Use to check blood sugar as directed up to 3 times a day  1 each  0  . cyclobenzaprine (FLEXERIL) 10 MG tablet TAKE 1 TABLET BY MOUTH AT BEDTIME AS NEEDED FOR MUSCLE SPASM  30 tablet  0  . fluticasone (FLONASE) 50 MCG/ACT nasal spray Place 2 sprays into the nose daily.  16 g  11  . glimepiride (AMARYL) 4 MG tablet Take 1 tablet (4 mg total) by mouth daily before breakfast.  30 tablet  11  . Insulin Glargine 100 UNIT/ML SOPN Inject 30 Units into the skin at bedtime.  5 pen  5  . Insulin Pen Needle (BD ULTRA-FINE PEN NEEDLES) 29G X 12.7MM MISC 100 each by Does not apply route at bedtime.  100 each  3  . Lancets 30G MISC Use to check blood sugar as directed up to 3 times a day. diag code 250.02. Insulin dependent  100 each  5  . quinapril (ACCUPRIL) 40 MG tablet Take 1 tablet (40 mg total) by mouth daily.  90 tablet  3  . Ranitidine HCl (ZANTAC PO) Take by mouth  daily.       No current facility-administered medications on file prior to visit.    Allergies: Allergies  Allergen Reactions  . Metformin And Related     Severe nausea and vomiting  . Aspirin Nausea And Vomiting    Can take EC asa  . Contrast Media [Iodinated Diagnostic Agents] Nausea And Vomiting  . Shellfish Allergy Nausea And Vomiting    FH: Family History  Problem Relation Age of Onset  . Colon cancer Neg Hx   . Colon polyps Neg Hx   . Rectal cancer Neg Hx   . Stomach cancer Neg Hx   . Hypertension Mother   . Diabetes Mother   . Thyroid disease Mother   . Heart disease Mother   . Kidney disease Mother   . Diabetes Sister   . Hypertension Brother   . Stroke Brother   . Heart disease Brother     SH: History   Social History  . Marital Status: Single    Spouse Name: N/A    Number of Children: N/A  . Years of Education: 8   Social History Main Topics  . Smoking status: Former Smoker    Quit date: 08/02/1990  . Smokeless tobacco: Never Used  .  Alcohol Use: No  . Drug Use: No  . Sexual Activity: No   Other Topics Concern  . Not on file   Social History Narrative  . No narrative on file    Review of Systems: Constitutional: Negative for fever, chills and weight loss.  Eyes: Negative for blurred vision.  Respiratory: Negative for cough and shortness of breath.  Cardiovascular: Negative for chest pain, palpitations and leg swelling.  Gastrointestinal: Negative for nausea, vomiting, abdominal pain, diarrhea, constipation and blood in stool.  Genitourinary: Negative for dysuria, urgency and frequency.  Musculoskeletal: Negative for myalgias and back pain.  Neurological: Negative for dizziness, weakness and headaches.     Objective:   Vital Signs: Filed Vitals:   08/23/13 1440  BP: 131/87  Pulse: 100  Temp: 99 F (37.2 C)  Height: 5' 3"  (1.6 m)  Weight: 176 lb 3.2 oz (79.924 kg)  SpO2: 99%      BP Readings from Last 3 Encounters:  08/23/13  131/87  04/19/13 130/86  04/14/13 149/88    Physical Exam: Constitutional: Vital signs reviewed.  Patient is well-developed and well-nourished in NAD and cooperative with exam.  Head: Normocephalic and atraumatic. Eyes: PERRL, EOMI, conjunctivae nl, no scleral icterus.  Neck: Supple. Cardiovascular: RRR, no MRG. Pulmonary/Chest: normal effort, non-tender to palpation, CTAB, no wheezes, rales, or rhonchi. Abdominal: Soft. NT/ND +BS. Neurological: A&O x3, cranial nerves II-XII are grossly intact, moving all extremities. Extremities: 2+DP b/l; no pitting edema. Skin: Warm, dry and intact. No rash.  Most Recent Laboratory Results:  CMP     Component Value Date/Time   NA 139 01/04/2013 1643   K 3.6 01/04/2013 1643   CL 103 01/04/2013 1643   CO2 25 01/04/2013 1643   GLUCOSE 143* 01/04/2013 1643   BUN 10 01/04/2013 1643   CREATININE 0.64 01/04/2013 1643   CREATININE 0.70 08/02/2010 1735   CALCIUM 9.3 01/04/2013 1643   PROT 8.4* 01/04/2013 1643   ALBUMIN 4.1 01/04/2013 1643   AST 16 01/04/2013 1643   ALT 19 01/04/2013 1643   ALKPHOS 81 01/04/2013 1643   BILITOT 0.3 01/04/2013 1643   GFRNONAA >89 01/04/2013 1643   GFRNONAA >60 06/30/2010 0043   GFRAA >89 01/04/2013 1643   GFRAA  Value: >60        The eGFR has been calculated using the MDRD equation. This calculation has not been validated in all clinical situations. eGFR's persistently <60 mL/min signify possible Chronic Kidney Disease. 06/30/2010 0043    CBC    Component Value Date/Time   WBC 3.7* 05/12/2012 1651   RBC 4.89 05/12/2012 1651   HGB 13.9 05/12/2012 1651   HCT 41.6 05/12/2012 1651   PLT 249 05/12/2012 1651   MCV 85.1 05/12/2012 1651   MCH 28.4 05/12/2012 1651   MCHC 33.4 05/12/2012 1651   RDW 13.6 05/12/2012 1651   LYMPHSABS 1.1 11/04/2011 0000   MONOABS 0.3 11/04/2011 0000   EOSABS 0.0 11/04/2011 0000   BASOSABS 0.0 11/04/2011 0000    Lipid Panel Lab Results  Component Value Date   CHOL 230* 01/04/2013   HDL 53 01/04/2013    LDLCALC 158* 01/04/2013   TRIG 94 01/04/2013   CHOLHDL 4.3 01/04/2013    HA1C Lab Results  Component Value Date   HGBA1C 8.2 08/23/2013    Urinalysis    Component Value Date/Time   COLORURINE YELLOW 06/30/2010 0041   APPEARANCEUR CLEAR 06/30/2010 0041   LABSPEC 1.041* 06/30/2010 0041   PHURINE 5.5 06/30/2010 0041   GLUCOSEU  NEGATIVE 02/22/2008 1141   HGBUR NEGATIVE 06/30/2010 0041   HGBUR negative 02/26/2008 0841   BILIRUBINUR NEGATIVE 06/30/2010 0041   KETONESUR NEGATIVE 06/30/2010 0041   PROTEINUR NEGATIVE 06/30/2010 0041   UROBILINOGEN 1.0 06/30/2010 0041   NITRITE NEGATIVE 06/30/2010 0041   LEUKOCYTESUR NEGATIVE 06/30/2010 0041    Urine Microalbumin No results found for this basename: MICROALBUR,  [id number]    Imaging N/A   Assessment & Plan:   Assessment and plan was discussed and formulated with my attending.  Patient should return [facility] the [facility] in [facility] month([facility]) for a HA1C recheck. Topics to be addressed at next visit include: compliance with CPAP, completion of health maintenance including zostavax and MM.  She will also need LP and foot exam.

## 2013-08-24 ENCOUNTER — Other Ambulatory Visit: Payer: Self-pay | Admitting: Internal Medicine

## 2013-08-24 ENCOUNTER — Other Ambulatory Visit: Payer: Self-pay | Admitting: Dietician

## 2013-08-24 DIAGNOSIS — IMO0002 Reserved for concepts with insufficient information to code with codable children: Secondary | ICD-10-CM

## 2013-08-24 DIAGNOSIS — E1165 Type 2 diabetes mellitus with hyperglycemia: Secondary | ICD-10-CM

## 2013-08-24 LAB — VITAMIN D 25 HYDROXY (VIT D DEFICIENCY, FRACTURES): VIT D 25 HYDROXY: 19 ng/mL — AB (ref 30–89)

## 2013-08-24 MED ORDER — INSULIN PEN NEEDLE 32G X 4 MM MISC: Status: DC

## 2013-08-24 MED ORDER — VITAMIN D3 1.25 MG (50000 UT) PO CAPS: 50000.0000 [IU] | ORAL_CAPSULE | ORAL | Status: DC

## 2013-08-24 NOTE — Telephone Encounter (Signed)
Patient left message requesting shorter and thinner pen needle prescription

## 2013-08-24 NOTE — Progress Notes (Signed)
Called pt Tiffany [name] voiced understanding of vitamin d and how to take Tiffany [name] also ask about her Lorrin Mais and if it had been called in yet Also Tiffany [name] would like different pen needles but doesn't know what kind, Tiffany [name] was transferred to donnap.

## 2013-08-24 NOTE — Progress Notes (Signed)
Cannot send [employer] electronically, it must be called in, i have done that this am. Pt did return my call and voiced understanding of vitamin d therapy

## 2013-08-24 NOTE — Progress Notes (Signed)
[name] was sent in last night-it should be available at her pharmacy.  Thanks.

## 2013-08-24 NOTE — Progress Notes (Signed)
Attempted to call pt, vmail left

## 2013-08-25 ENCOUNTER — Other Ambulatory Visit: Payer: Self-pay | Admitting: Dietician

## 2013-08-25 DIAGNOSIS — E1165 Type 2 diabetes mellitus with hyperglycemia: Secondary | ICD-10-CM

## 2013-08-25 DIAGNOSIS — IMO0002 Reserved for concepts with insufficient information to code with codable children: Secondary | ICD-10-CM

## 2013-08-25 MED ORDER — ACCU-CHEK NANO SMARTVIEW W/DEVICE KIT: PACK | Status: DC

## 2013-08-25 MED ORDER — ACCU-CHEK FASTCLIX LANCETS MISC: Status: DC

## 2013-08-25 MED ORDER — GLUCOSE BLOOD VI STRP: ORAL_STRIP | Status: DC

## 2013-08-25 NOTE — Telephone Encounter (Signed)
Patient called asking for prescription for strips and lancets sent for 3x a day testing to [facility] on [address]. Offered her a sample Accu chek Nano meter which she wants to pick up. She also asked about the Vitamin D dose. Told her this is standard treatment for deficient vitamin D and how she could increase her vitamin D intake to help keep her vitamin D up. Information provided from Nutrition care manual on sources of vitamin D.

## 2013-08-26 ENCOUNTER — Ambulatory Visit (INDEPENDENT_AMBULATORY_CARE_PROVIDER_SITE_OTHER): Payer: Medicare Other | Admitting: Sports Medicine

## 2013-08-26 ENCOUNTER — Encounter: Payer: Self-pay | Admitting: Sports Medicine

## 2013-08-26 VITALS — BP 137/91 | Ht 63.0 in | Wt 178.0 lb

## 2013-08-26 DIAGNOSIS — G8929 Other chronic pain: Secondary | ICD-10-CM

## 2013-08-26 DIAGNOSIS — M25569 Pain in unspecified knee: Secondary | ICD-10-CM

## 2013-08-26 DIAGNOSIS — M5416 Radiculopathy, lumbar region: Secondary | ICD-10-CM

## 2013-08-26 DIAGNOSIS — M25561 Pain in right knee: Secondary | ICD-10-CM

## 2013-08-26 DIAGNOSIS — M25562 Pain in left knee: Principal | ICD-10-CM

## 2013-08-26 DIAGNOSIS — IMO0002 Reserved for concepts with insufficient information to code with codable children: Secondary | ICD-10-CM

## 2013-08-26 MED ORDER — KETOROLAC TROMETHAMINE 60 MG/2ML IM SOLN
60.0000 mg | Freq: Once | INTRAMUSCULAR | Status: AC
  Administered 2013-08-26: 60 mg via INTRAMUSCULAR

## 2013-08-26 MED ORDER — METHYLPREDNISOLONE ACETATE 80 MG/ML IJ SUSP
80.0000 mg | Freq: Once | INTRAMUSCULAR | Status: AC
  Administered 2013-08-26: 80 mg via INTRAMUSCULAR

## 2013-08-27 NOTE — Progress Notes (Signed)
   Subjective:    Patient ID: Tiffany [name], female    DOB: 09-26-1950, 63 y.o.   MRN: [id number]  HPI Patient comes in today with multiple complaints. She's complaining of low back pain which is a chronic issue for her. Also complaining of chronic bilateral knee pain. In regards to the lumbar spine, she has had previous lumbar spine surgery and has had lumbar epidurals as well. Despite this her pain persists. She has had cortisone injections in her knees as well. X-rays of each knee show only mild degenerative changes. She is also complaining of lateral left ankle pain which is been present now for a few weeks. No trauma. She has noticed associated swelling. Pain is worse with weightbearing improve some at rest.    Review of Systems     Objective:   Physical Exam Well-developed, well-nourished. No acute distress. Awake alert and oriented x3.  Lumbar spine: Limited lumbar mobility secondary to pain. Diffuse tenderness to palpation along the lumbosacral area but nothing focal. There are no focal neurological deficits of either lower extremity.  Knees: Range of motion in each knee is 0-110. 1+ boggy synovitis. No effusion. Diffuse tenderness to palpation. Good ligamentous stability.  Left ankle: There is swelling and tenderness to palpation along the course of the peroneal tendon. Pes planus with standing. Walking with a slight limp.       Assessment & Plan:  Chronic low back pain Chronic bilateral knee pain secondary to mild DJD Left ankle pain secondary to peroneal tendon strain  Given her diffuse pain I recommended IM injections of both Depo-Medrol and Toradol. Patient understands there'll be a transient hyperglycemia associated with the IM Depo-Medrol injection but it will not have the same effect as oral prednisone. I've given her a pair of green sports insoles for her shoes. I think the best long-term treatment for this patient is chronic pain management. We will not manage her  chronic pain through our office. My recommendation is for her to discuss this further with her primary care physician and if her PCP is uncomfortable managing her chronic pain then a referral to a pain management specialist should be considered. We will set up a tentative followup appointment in 6 weeks but I am not sure that I have much else that I can add to her overall treatment plan.

## 2013-08-30 NOTE — Progress Notes (Signed)
Case discussed with Dr. Gill at the time of the visit.  We reviewed the resident's history and exam and pertinent patient test results.  I agree with the assessment, diagnosis, and plan of care documented in the resident's note. 

## 2013-09-02 ENCOUNTER — Telehealth: Payer: Self-pay | Admitting: *Deleted

## 2013-09-02 NOTE — Telephone Encounter (Signed)
Pt advised of message from Dr. Micheline Chapman.

## 2013-09-02 NOTE — Telephone Encounter (Signed)
Message copied by Ocie Bob on Thu Sep 02, 2013  4:22 PM ------      Message from: Lilia Argue R      Created: Thu Sep 02, 2013  4:12 PM      Regarding: RE: message      Contact: [phone]       Needs to see her PCP for pain management. We will not manage this through our office. She does not need a f/u appt with our clinic.            ----- Message -----         From: Ocie Bob, RN         Sent: 09/02/2013   4:02 PM           To: Thurman Coyer, DO      Subject: FW: message                                                          ----- Message -----         From: Carolyne Littles         Sent: 09/02/2013   2:40 PM           To: Ocie Bob, RN      Subject: message                                                  Pt states back injection didn't help with pain, she would like pain meds.       ------

## 2013-09-05 ENCOUNTER — Other Ambulatory Visit: Payer: Self-pay | Admitting: Internal Medicine

## 2013-09-06 ENCOUNTER — Other Ambulatory Visit: Payer: Self-pay | Admitting: Internal Medicine

## 2013-09-07 ENCOUNTER — Other Ambulatory Visit: Payer: Self-pay | Admitting: *Deleted

## 2013-09-07 ENCOUNTER — Other Ambulatory Visit: Payer: Self-pay | Admitting: Sports Medicine

## 2013-09-08 ENCOUNTER — Other Ambulatory Visit: Payer: Self-pay | Admitting: Internal Medicine

## 2013-09-17 ENCOUNTER — Ambulatory Visit (HOSPITAL_COMMUNITY)
Admission: RE | Admit: 2013-09-17 | Discharge: 2013-09-17 | Disposition: A | Discharge status: Home / Self Care | Payer: Medicare Other | Source: Ambulatory Visit | Attending: Internal Medicine | Admitting: Internal Medicine

## 2013-09-17 ENCOUNTER — Other Ambulatory Visit: Payer: Self-pay | Admitting: Internal Medicine

## 2013-09-17 DIAGNOSIS — Z0000 Encounter for general adult medical examination without abnormal findings: Secondary | ICD-10-CM

## 2013-09-17 DIAGNOSIS — Z1231 Encounter for screening mammogram for malignant neoplasm of breast: Secondary | ICD-10-CM | POA: Insufficient documentation

## 2013-10-11 ENCOUNTER — Ambulatory Visit: Payer: Medicare Other | Admitting: Sports Medicine

## 2013-10-25 ENCOUNTER — Ambulatory Visit (INDEPENDENT_AMBULATORY_CARE_PROVIDER_SITE_OTHER): Payer: Medicare Other | Admitting: Sports Medicine

## 2013-10-25 ENCOUNTER — Encounter: Payer: Self-pay | Admitting: Sports Medicine

## 2013-10-25 VITALS — BP 149/93 | Ht 63.0 in | Wt 161.0 lb

## 2013-10-25 DIAGNOSIS — M25561 Pain in right knee: Secondary | ICD-10-CM

## 2013-10-25 DIAGNOSIS — M25562 Pain in left knee: Principal | ICD-10-CM

## 2013-10-25 DIAGNOSIS — M5416 Radiculopathy, lumbar region: Secondary | ICD-10-CM

## 2013-10-25 DIAGNOSIS — M25569 Pain in unspecified knee: Secondary | ICD-10-CM

## 2013-10-25 DIAGNOSIS — IMO0002 Reserved for concepts with insufficient information to code with codable children: Secondary | ICD-10-CM

## 2013-10-25 DIAGNOSIS — G8929 Other chronic pain: Secondary | ICD-10-CM

## 2013-10-25 MED ORDER — METHYLPREDNISOLONE ACETATE 80 MG/ML IJ SUSP
80.0000 mg | Freq: Once | INTRAMUSCULAR | Status: AC
  Administered 2013-10-25: 80 mg via INTRAMUSCULAR

## 2013-10-25 MED ORDER — KETOROLAC TROMETHAMINE 60 MG/2ML IM SOLN
60.0000 mg | Freq: Once | INTRAMUSCULAR | Status: AC
  Administered 2013-10-25: 60 mg via INTRAMUSCULAR

## 2013-10-25 NOTE — Progress Notes (Signed)
   Subjective:    Patient ID: Tiffany [name], female    DOB: May 25, 1951, 63 y.o.   MRN: [id number]  HPI Patient comes in today for followup on chronic pain. Still having chronic bilateral knee pain. We had previously discussed the patient following up with Dr.Alusio for meniscal tears in each knee. Her main complaint today is low back and neck pain. Pain was exacerbated when she had to catch her falling mother last week. Pain is diffuse. No associated numbness or tingling in her arms or legs. She states that the IM injections of Depo-Medrol and Toradol were helpful before.    Review of Systems     Objective:   Physical Exam Sitting comfortable in the exam room. No acute distress  She has limited range of motion of both the cervical and lumbar spine. Diffuse tenderness to palpation. Neurological exam shows no focal neurological deficits of either the upper or lower extremities.       Assessment & Plan:  Chronic pain  I once again explained to the patient that I think she should followup with her primary care physician to establish a plan of care for her chronic diffuse pain. She may need referral to a pain management clinic. She may also want to followup with Dr Maureen Ralphs for her ongoing chronic knee problems. Given her improvement with previous IM Depo-Medrol and Toradol injections we will repeat both of those today (80 mg of Depo-Medrol and 60 mg of Toradol). Patient will followup with me prn.

## 2013-10-29 ENCOUNTER — Other Ambulatory Visit: Payer: Self-pay | Admitting: *Deleted

## 2013-10-29 DIAGNOSIS — G4700 Insomnia, unspecified: Secondary | ICD-10-CM

## 2013-10-31 MED ORDER — ZOLPIDEM TARTRATE 10 MG PO TABS: ORAL_TABLET | ORAL | Status: DC

## 2013-11-01 NOTE — Telephone Encounter (Signed)
Rx called in to pharmacy. Hilda Blades Ciani Rutten RN 11/01/13 9:10AM

## 2013-11-14 ENCOUNTER — Other Ambulatory Visit: Payer: Self-pay | Admitting: Internal Medicine

## 2013-12-08 ENCOUNTER — Emergency Department (HOSPITAL_COMMUNITY)
Admission: EM | Admit: 2013-12-08 | Discharge: 2013-12-08 | Disposition: A | Discharge status: Home / Self Care | Payer: Medicare Other | Attending: Emergency Medicine | Admitting: Emergency Medicine

## 2013-12-08 ENCOUNTER — Emergency Department (HOSPITAL_COMMUNITY): Payer: Medicare Other

## 2013-12-08 ENCOUNTER — Encounter (HOSPITAL_COMMUNITY): Payer: Self-pay | Admitting: Emergency Medicine

## 2013-12-08 DIAGNOSIS — Y9389 Activity, other specified: Secondary | ICD-10-CM | POA: Insufficient documentation

## 2013-12-08 DIAGNOSIS — Z87891 Personal history of nicotine dependence: Secondary | ICD-10-CM | POA: Insufficient documentation

## 2013-12-08 DIAGNOSIS — Z9889 Other specified postprocedural states: Secondary | ICD-10-CM | POA: Insufficient documentation

## 2013-12-08 DIAGNOSIS — Z79899 Other long term (current) drug therapy: Secondary | ICD-10-CM | POA: Insufficient documentation

## 2013-12-08 DIAGNOSIS — W102XXA Fall (on)(from) incline, initial encounter: Secondary | ICD-10-CM

## 2013-12-08 DIAGNOSIS — IMO0002 Reserved for concepts with insufficient information to code with codable children: Secondary | ICD-10-CM | POA: Insufficient documentation

## 2013-12-08 DIAGNOSIS — M545 Low back pain, unspecified: Secondary | ICD-10-CM

## 2013-12-08 DIAGNOSIS — Z794 Long term (current) use of insulin: Secondary | ICD-10-CM | POA: Insufficient documentation

## 2013-12-08 DIAGNOSIS — W108XXA Fall (on) (from) other stairs and steps, initial encounter: Secondary | ICD-10-CM | POA: Insufficient documentation

## 2013-12-08 DIAGNOSIS — E119 Type 2 diabetes mellitus without complications: Secondary | ICD-10-CM | POA: Insufficient documentation

## 2013-12-08 DIAGNOSIS — Z8739 Personal history of other diseases of the musculoskeletal system and connective tissue: Secondary | ICD-10-CM | POA: Insufficient documentation

## 2013-12-08 DIAGNOSIS — I10 Essential (primary) hypertension: Secondary | ICD-10-CM | POA: Insufficient documentation

## 2013-12-08 DIAGNOSIS — Y9289 Other specified places as the place of occurrence of the external cause: Secondary | ICD-10-CM | POA: Insufficient documentation

## 2013-12-08 MED ORDER — HYDROMORPHONE HCL PF 1 MG/ML IJ SOLN
0.5000 mg | Freq: Once | INTRAMUSCULAR | Status: AC
  Administered 2013-12-08: 1 mg via INTRAMUSCULAR
  Filled 2013-12-08: qty 1

## 2013-12-08 MED ORDER — ONDANSETRON 4 MG PO TBDP
ORAL_TABLET | ORAL | Status: AC
  Filled 2013-12-08: qty 1

## 2013-12-08 MED ORDER — OXYCODONE-ACETAMINOPHEN 5-325 MG PO TABS: 1.0000 | ORAL_TABLET | Freq: Four times a day (QID) | ORAL | Status: DC | PRN

## 2013-12-08 MED ORDER — DIAZEPAM 2 MG PO TABS
2.0000 mg | ORAL_TABLET | Freq: Once | ORAL | Status: AC
  Administered 2013-12-08: 2 mg via ORAL
  Filled 2013-12-08: qty 1

## 2013-12-08 MED ORDER — OXYCODONE-ACETAMINOPHEN 5-325 MG PO TABS
1.0000 | ORAL_TABLET | Freq: Once | ORAL | Status: AC
  Administered 2013-12-08: 1 via ORAL
  Filled 2013-12-08: qty 1

## 2013-12-08 MED ORDER — METHOCARBAMOL 500 MG PO TABS: 500.0000 mg | ORAL_TABLET | Freq: Two times a day (BID) | ORAL | Status: DC

## 2013-12-08 MED ORDER — ONDANSETRON 4 MG PO TBDP
4.0000 mg | ORAL_TABLET | Freq: Once | ORAL | Status: AC
  Administered 2013-12-08: 4 mg via ORAL

## 2013-12-08 NOTE — ED Notes (Signed)
Pt endorses her knee gave out on her, falling backward down 6 steps yesterday while dragging her laundry basket up the stairs. "I may have went out for a couple of seconds." Pt was able to get up on her own and walk to her apartment. C/O neck and back pain.

## 2013-12-08 NOTE — ED Notes (Signed)
Pt reports falling yesterday when her legs gave our on her, hx of back problems. Pt reports falling back and hitting back of head and having back pain and head pain. Questionable loc. No acute distress noted at triage and was ambulatory on arrival.

## 2013-12-08 NOTE — ED Provider Notes (Signed)
CSN: [id number]     Arrival date & time 12/08/13  1229 History   First MD Initiated Contact with Patient 12/08/13 1236     Chief Complaint  Patient presents with  . Fall     (Consider location/radiation/quality/duration/timing/severity/associated sxs/prior Treatment) HPI  Patient to the ER with complaints of falling yesterday and injuring her mid to low back. Tiffany [name] uses a cane typically to walk and reports that her knee gave out after having gone up six stairs causing her to fall backwards. Tiffany [name] denies hitting her head but reports inuring her back. Tiffany [name] did not fall down all six stairs. Tiffany [name] denies LOC. Tiffany [name] reports laying there for a few minutes to get her bearings before standing up on her own. Tiffany [name] has chronic back pain and tried her flexeril and Vicodin but it did not help completely get rid of her pain. Tiffany [name] reports it is still sever all the way from her hips up to her mid back. Denies bowel or urine incontinence. Tiffany [name] would like to get an MRI and control of her pain. Denies leg weakness or numbness.   Past Medical History  Diagnosis Date  . Diabetes mellitus   . Hypertension   . Hyperlipidemia   . Degenerative joint disease   . Sleep apnea    Past Surgical History  Procedure Laterality Date  . Lumbar laminectomy  1989  . Knee arthroscopy      left x 2  . Abdominal hysterectomy  1976   Family History  Problem Relation Age of Onset  . Colon cancer Neg Hx   . Colon polyps Neg Hx   . Rectal cancer Neg Hx   . Stomach cancer Neg Hx   . Hypertension Mother   . Diabetes Mother   . Thyroid disease Mother   . Heart disease Mother   . Kidney disease Mother   . Diabetes Sister   . Hypertension Brother   . Stroke Brother   . Heart disease Brother    History  Substance Use Topics  . Smoking status: Former Smoker    Quit date: 08/02/1990  . Smokeless tobacco: Never Used  . Alcohol Use: No   OB History   Grav Para Term Preterm Abortions TAB SAB Ect Mult Living                  Review of Systems   Review of Systems  Gen: no weight loss, fevers, chills, night sweats  Eyes: no discharge or drainage, no occular pain or visual changes  Nose: no epistaxis or rhinorrhea  Mouth: no dental pain, no sore throat  Neck: no neck pain  Lungs:No wheezing, coughing or hemoptysis CV: no chest pain, palpitations, dependent edema or orthopnea  Abd: no abdominal pain, nausea, vomiting, diarrhea GU: no dysuria or gross hematuria  MSK:  + back pain Neuro: no headache, no focal neurologic deficits  Skin: no rash or wounds Psyche: no complaints    Allergies  Metformin and related; Aspirin; Contrast media; and Shellfish allergy  Home Medications   Prior to Admission medications   Medication Sig Start Date End Date Taking? Authorizing Provider  ACCU-CHEK FASTCLIX LANCETS MISC Use to check blood sugar as directed up to 3 times a day. diag code 250.02. Insulin dependent 08/25/13  Yes Jones Bales, MD  amLODipine (NORVASC) 5 MG tablet Take 1 tablet (5 mg total) by mouth daily.   Yes Jones Bales, MD  Blood Glucose Monitoring Suppl (ACCU-CHEK NANO SMARTVIEW) W/DEVICE KIT Use to  check blood sugar as directed up to 3 times a day. diag code 250.02. Insulin dependent 08/25/13  Yes Jones Bales, MD  cyclobenzaprine (FLEXERIL) 10 MG tablet Take 10 mg by mouth at bedtime.   Yes Historical Provider, MD  glimepiride (AMARYL) 4 MG tablet Take 1 tablet (4 mg total) by mouth daily before breakfast.   Yes Bartholomew Crews, MD  glucose blood (ACCU-CHEK SMARTVIEW) test strip Use to check blood sugar as directed up to 3 times a day. diag code 250.02. Insulin dependent 08/25/13  Yes Jones Bales, MD  Insulin Glargine (LANTUS SOLOSTAR) 100 UNIT/ML Solostar Pen Inject 30 Units into the skin at bedtime.   Yes Historical Provider, MD  Insulin Pen Needle 32G X 4 MM MISC Use to inject  lantus once daily 08/24/13  Yes Jones Bales, MD  quinapril (ACCUPRIL) 40 MG tablet Take 1 tablet (40  mg total) by mouth daily. 05/10/13 05/10/14 Yes Jones Bales, MD  Ranitidine HCl (ZANTAC PO) Take 150 mg by mouth daily as needed (for heart burn).    Yes Historical Provider, MD  zolpidem (AMBIEN) 10 MG tablet Take 10 mg by mouth at bedtime as needed for sleep.   Yes Historical Provider, MD  fluticasone (FLONASE) 50 MCG/ACT nasal spray Place 2 sprays into the nose daily. 02/26/11   Lind Covert, MD  methocarbamol (ROBAXIN) 500 MG tablet Take 1 tablet (500 mg total) by mouth 2 (two) times daily. 12/08/13   Monty Spicher Marilu Favre, PA-C  oxyCODONE-acetaminophen (PERCOCET/ROXICET) 5-325 MG per tablet Take 1-2 tablets by mouth every 6 (six) hours as needed for severe pain. 12/08/13   Jahara Dail Marilu Favre, PA-C   BP 140/72  Pulse 82  Temp(Src) 98 F (36.7 C) (Oral)  Resp 20  Ht _0  (1.6 m)  Wt 164 lb (74.39 kg)  BMI 29.06 kg/m2  SpO2 97% Physical Exam  Nursing note and vitals reviewed. Constitutional: Tiffany [name] appears well-developed and well-nourished. No distress.  HENT:  Head: Normocephalic and atraumatic.  Eyes: Pupils are equal, round, and reactive to light.  Neck: Normal range of motion. Neck supple.  Cardiovascular: Normal rate and regular rhythm.   Pulmonary/Chest: Effort normal.  Abdominal: Soft.  Musculoskeletal:  Pt has equal strength to bilateral lower extremities.  Neurosensory function adequate to both legs No clonus on dorsiflextion Skin color is normal. Skin is warm and moist.  I see no step off deformity, no midline bony tenderness.  Pt is able to ambulate.  No crepitus, laceration, effusion, induration, lesions, swelling.   Pedal pulses are symmetrical and palpable bilaterally  Mild/moderate tenderness to palpation of lumbar and thoracic paraspinel muscles   Neurological: Tiffany [name] is alert.  Skin: Skin is warm and dry.    ED Course  Procedures (including critical care time) Labs Review Labs Reviewed - No data to display  Imaging Review Dg Thoracic Spine 2  View  12/08/2013   CLINICAL DATA:  Mid upper back pain status post fall  EXAM: THORACIC SPINE - 2 VIEW  COMPARISON:  None.  FINDINGS: There is gentle curvature of the thoracolumbar spine convex toward the right. The vertebral bodies are preserved in height. Very mild degenerative disc changes are noted at multiple levels. There are no abnormal paravertebral soft tissue densities.  IMPRESSION: There is no acute bony abnormality of the thoracic spine.   Electronically Signed   By: David  Martinique   On: 12/08/2013 14:18   Dg Lumbar Spine Complete  12/08/2013   CLINICAL DATA:  Mid and low back pain status post fall  EXAM: LUMBAR SPINE - COMPLETE 4+ VIEW  COMPARISON:  Lumbar spine series dated September 19, 2011.  FINDINGS: The lumbar vertebral bodies are preserved in height. There is mild stable disc space narrowing at L1-L2, L4-5, and at L5-S1. There is stable mild anterolisthesis of L4 with respect L5. There is mild facet joint hypertrophy at L4-5 and at L5-S1. The pedicles and transverse processes are intact where visualized. The observed portions of the sacrum are normal.  IMPRESSION: There is mild stable degenerative disc change of the lumbar spine. There is no acute compression or other acute abnormality.   Electronically Signed   By: David  Martinique   On: 12/08/2013 14:20     EKG Interpretation None      MDM   Final diagnoses:  Fall (on)(from) incline, initial encounter  Bilateral low back pain without sciatica    12/08/13 1545  HYDROmorphone (DILAUDID) injection 0.5 mg Once   Route: Intramuscular Ordered Dose: 0.5 mg   Last MAR action: Given Linus Mako 12/08/13 1445  HYDROmorphone (DILAUDID) injection 0.5 mg Once   Route: Intramuscular Ordered Dose: 0.5 mg   Last MAR action: Given Cristan Scherzer G 12/08/13 1315  diazepam (VALIUM) tablet 2 mg Once   Route: Oral Ordered Dose: 2 mg   Last MAR action: Given Mubashir Mallek G 12/08/13 1315  oxyCODONE-acetaminophen (PERCOCET/ROXICET) 5-325  MG per tablet 1 tablet Once   Route: Oral Ordered Dose: 1 tablet      Tiffany [name]'s pain treated in the emergency department. Her x-rays did not show any acute abnormalities and Tiffany [name] had a normal neurological exam. Her history of present illness did not describe any symptoms that had me concerned for underlying cord injury. Tiffany [name] is able to get up and ambulates with a cane which Tiffany [name] times needs at baseline. Tiffany [name] was given pain medications in the ER which significantly improved her pain. Tiffany [name] has an appointment coming up with her primary care Dr. who will reevaluate her back. Tiffany [name] may need an MRI at some point however I discussed with the patient that an emergent MRI is not appropriate with her presentation.  63 y.o.Tiffany [name]'s  with back pain. No neurological deficits and normal neuro exam. Patient can walk. No loss of bowel or bladder control. No concern for cauda equina at this time base on HPI and physical exam findings. No fever, night sweats, weight loss, h/o cancer, IVDU.   RICE protocol and pain medicine indicated and discussed with patient.   Patient Plan 1. Medications: pain medication and muscle relaxer. Cont usual home medications unless otherwise directed. 2. Treatment: rest, drink plenty of fluids, gentle stretching as discussed, alternate ice and heat  3. Follow Up: Please followup with your primary doctor for discussion of your diagnoses and further evaluation after today's visit; if you do not have a primary care doctor use the resource guide provided to find one   Vital signs are stable at discharge. Filed Vitals:   12/08/13 1715  BP: 140/72  Pulse: 82  Temp:   Resp: 20    Patient/guardian has voiced understanding and agreed to follow-up with the PCP or specialist.         Linus Mako, PA-C 12/09/13 1549

## 2013-12-08 NOTE — Discharge Instructions (Signed)
Back Pain, Adult °Low back pain is very common. About 1 in 5 people have back pain. The cause of low back pain is rarely dangerous. The pain often gets better over time. About half of people with a sudden onset of back pain feel better in just 2 weeks. About 8 in 10 people feel better by 6 weeks.  °CAUSES °Some common causes of back pain include: °· Strain of the muscles or ligaments supporting the spine. °· Wear and tear (degeneration) of the spinal discs. °· Arthritis. °· Direct injury to the back. °DIAGNOSIS °Most of the time, the direct cause of low back pain is not known. However, back pain can be treated effectively even when the exact cause of the pain is unknown. Answering your caregiver's questions about your overall health and symptoms is one of the most accurate ways to make sure the cause of your pain is not dangerous. If your caregiver needs more information, he or she may order lab work or imaging tests (X-rays or MRIs). However, even if imaging tests show changes in your back, this usually does not require surgery. °HOME CARE INSTRUCTIONS °For many people, back pain returns. Since low back pain is rarely dangerous, it is often a condition that people can learn to manage on their own.  °· Remain active. It is stressful on the back to sit or stand in one place. Do not sit, drive, or stand in one place for more than 30 minutes at a time. Take short walks on level surfaces as soon as pain allows. Try to increase the length of time you walk each day. °· Do not stay in bed. Resting more than 1 or 2 days can delay your recovery. °· Do not avoid exercise or work. Your body is made to move. It is not dangerous to be active, even though your back may hurt. Your back will likely heal faster if you return to being active before your pain is gone. °· Pay attention to your body when you  bend and lift. Many people have less discomfort when lifting if they bend their knees, keep the load close to their bodies, and  avoid twisting. Often, the most comfortable positions are those that put less stress on your recovering back. °· Find a comfortable position to sleep. Use a firm mattress and lie on your side with your knees slightly bent. If you lie on your back, put a pillow under your knees. °· Only take over-the-counter or prescription medicines as directed by your caregiver. Over-the-counter medicines to reduce pain and inflammation are often the most helpful. Your caregiver may prescribe muscle relaxant drugs. These medicines help dull your pain so you can more quickly return to your normal activities and healthy exercise. °· Put ice on the injured area. °¨ Put ice in a plastic bag. °¨ Place a towel between your skin and the bag. °¨ Leave the ice on for 15-20 minutes, 03-04 times a day for the first 2 to 3 days. After that, ice and heat may be alternated to reduce pain and spasms. °· Ask your caregiver about trying back exercises and gentle massage. This may be of some benefit. °· Avoid feeling anxious or stressed. Stress increases muscle tension and can worsen back pain. It is important to recognize when you are anxious or stressed and learn ways to manage it. Exercise is a great option. °SEEK MEDICAL CARE IF: °· You have pain that is not relieved with rest or medicine. °· You have pain that does not improve in 1 week. °· You have new symptoms. °· You are generally not feeling well. °SEEK   IMMEDIATE MEDICAL CARE IF:   You have pain that radiates from your back into your legs.  You develop new bowel or bladder control problems.  You have unusual weakness or numbness in your arms or legs.  You develop nausea or vomiting.  You develop abdominal pain.  You feel faint. Document Released: 05/13/2005 Document Revised: 11/12/2011 Document Reviewed: 10/01/2010 [facility] Patient Information 2015 [place], [place]. This information is not intended to replace advice given to you by your health care provider. Make sure you  discuss any questions you have with your health care provider.  Fall Prevention and Home Safety Falls cause injuries and can affect all age groups. It is possible to use preventive measures to significantly decrease the likelihood of falls. There are many simple measures which can make your home safer and prevent falls. OUTDOORS  Repair cracks and edges of walkways and driveways.  Remove high doorway thresholds.  Trim shrubbery on the main path into your home.  Have good outside lighting.  Clear walkways of tools, rocks, debris, and clutter.  Check that handrails are not broken and are securely fastened. Both sides of steps should have handrails.  Have leaves, snow, and ice cleared regularly.  Use sand or salt on walkways during winter months.  In the garage, clean up grease or oil spills. BATHROOM  Install night lights.  Install grab bars by the toilet and in the tub and shower.  Use non-skid mats or decals in the tub or shower.  Place a plastic non-slip stool in the shower to sit on, if needed.  Keep floors dry and clean up all water on the floor immediately.  Remove soap buildup in the tub or shower on a regular basis.  Secure bath mats with non-slip, double-sided rug tape.  Remove throw rugs and tripping hazards from the floors. BEDROOMS  Install night lights.  Make sure a bedside light is easy to reach.  Do not use oversized bedding.  Keep a telephone by your bedside.  Have a firm chair with side arms to use for getting dressed.  Remove throw rugs and tripping hazards from the floor. KITCHEN  Keep handles on pots and pans turned toward the center of the stove. Use back burners when possible.  Clean up spills quickly and allow time for drying.  Avoid walking on wet floors.  Avoid hot utensils and knives.  Position shelves so they are not too high or low.  Place commonly used objects within easy reach.  If necessary, use a sturdy step stool with a  grab bar when reaching.  Keep electrical cables out of the way.  Do not use floor polish or wax that makes floors slippery. If you must use wax, use non-skid floor wax.  Remove throw rugs and tripping hazards from the floor. STAIRWAYS  Never leave objects on stairs.  Place handrails on both sides of stairways and use them. Fix any loose handrails. Make sure handrails on both sides of the stairways are as long as the stairs.  Check carpeting to make sure it is firmly attached along stairs. Make repairs to worn or loose carpet promptly.  Avoid placing throw rugs at the top or bottom of stairways, or properly secure the rug with carpet tape to prevent slippage. Get rid of throw rugs, if possible.  Have an electrician put in a light switch at the top and bottom of the stairs. OTHER FALL PREVENTION TIPS  Wear low-heel or rubber-soled shoes that are supportive and fit well.  Wear closed toe shoes.  When using a stepladder, make sure it is fully opened and both spreaders are firmly locked. Do not climb a closed stepladder.  Add color or contrast paint or tape to grab bars and handrails in your home. Place contrasting color strips on first and last steps.  Learn and use mobility aids as needed. Install an electrical emergency response system.  Turn on lights to avoid dark areas. Replace light bulbs that burn out immediately. Get light switches that glow.  Arrange furniture to create clear pathways. Keep furniture in the same place.  Firmly attach carpet with non-skid or double-sided tape.  Eliminate uneven floor surfaces.  Select a carpet pattern that does not visually hide the edge of steps.  Be aware of all pets. OTHER HOME SAFETY TIPS  Set the water temperature for 120 F (48.8 C).  Keep emergency numbers on or near the telephone.  Keep smoke detectors on every level of the home and near sleeping areas. Document Released: 05/03/2002 Document Revised: 11/12/2011 Document  Reviewed: 08/02/2011 [facility] Patient Information 2015 [place], [place]. This information is not intended to replace advice given to you by your health care provider. Make sure you discuss any questions you have with your health care provider.

## 2013-12-14 NOTE — ED Provider Notes (Signed)
Medical screening examination/treatment/procedure(s) were performed by non-physician practitioner and as supervising physician I was immediately available for consultation/collaboration.   EKG Interpretation None      Rolland Porter, MD, Abram Sander   Janice Norrie, MD 12/14/13 [phone]

## 2013-12-17 ENCOUNTER — Encounter: Payer: Self-pay | Admitting: Dietician

## 2013-12-21 ENCOUNTER — Encounter: Payer: Self-pay | Admitting: Internal Medicine

## 2013-12-24 ENCOUNTER — Other Ambulatory Visit: Payer: Self-pay | Admitting: Internal Medicine

## 2013-12-30 ENCOUNTER — Ambulatory Visit (HOSPITAL_COMMUNITY)
Admission: RE | Admit: 2013-12-30 | Discharge: 2013-12-30 | Disposition: A | Discharge status: Home / Self Care | Payer: Medicare Other | Source: Ambulatory Visit | Attending: Internal Medicine | Admitting: Internal Medicine

## 2013-12-30 ENCOUNTER — Ambulatory Visit (INDEPENDENT_AMBULATORY_CARE_PROVIDER_SITE_OTHER): Payer: Medicare Other | Admitting: Internal Medicine

## 2013-12-30 ENCOUNTER — Telehealth: Payer: Self-pay | Admitting: Dietician

## 2013-12-30 ENCOUNTER — Encounter: Payer: Self-pay | Admitting: Internal Medicine

## 2013-12-30 VITALS — BP 148/98 | HR 90 | Temp 97.7°F | Ht 63.0 in | Wt 169.5 lb

## 2013-12-30 DIAGNOSIS — E1165 Type 2 diabetes mellitus with hyperglycemia: Secondary | ICD-10-CM

## 2013-12-30 DIAGNOSIS — G4700 Insomnia, unspecified: Secondary | ICD-10-CM

## 2013-12-30 DIAGNOSIS — M5030 Other cervical disc degeneration, unspecified cervical region: Secondary | ICD-10-CM | POA: Insufficient documentation

## 2013-12-30 DIAGNOSIS — IMO0002 Reserved for concepts with insufficient information to code with codable children: Secondary | ICD-10-CM

## 2013-12-30 DIAGNOSIS — M542 Cervicalgia: Secondary | ICD-10-CM | POA: Diagnosis present

## 2013-12-30 DIAGNOSIS — Z794 Long term (current) use of insulin: Principal | ICD-10-CM

## 2013-12-30 DIAGNOSIS — IMO0001 Reserved for inherently not codable concepts without codable children: Secondary | ICD-10-CM

## 2013-12-30 DIAGNOSIS — G4730 Sleep apnea, unspecified: Secondary | ICD-10-CM

## 2013-12-30 DIAGNOSIS — M1990 Unspecified osteoarthritis, unspecified site: Secondary | ICD-10-CM

## 2013-12-30 DIAGNOSIS — G4733 Obstructive sleep apnea (adult) (pediatric): Secondary | ICD-10-CM

## 2013-12-30 DIAGNOSIS — I10 Essential (primary) hypertension: Secondary | ICD-10-CM

## 2013-12-30 DIAGNOSIS — Z0000 Encounter for general adult medical examination without abnormal findings: Secondary | ICD-10-CM

## 2013-12-30 LAB — GLUCOSE, CAPILLARY: Glucose-Capillary: 115 mg/dL — ABNORMAL HIGH (ref 70–99)

## 2013-12-30 LAB — POCT GLYCOSYLATED HEMOGLOBIN (HGB A1C): HEMOGLOBIN A1C: 7.6

## 2013-12-30 MED ORDER — ESCITALOPRAM OXALATE 10 MG PO TABS: 10.0000 mg | ORAL_TABLET | Freq: Every day | ORAL | Status: DC

## 2013-12-30 MED ORDER — NAPROXEN 500 MG PO TABS: 500.0000 mg | ORAL_TABLET | Freq: Two times a day (BID) | ORAL | Status: DC

## 2013-12-30 MED ORDER — OMEPRAZOLE 20 MG PO CPDR: 40.0000 mg | DELAYED_RELEASE_CAPSULE | Freq: Every day | ORAL | Status: AC

## 2013-12-30 MED ORDER — GLUCOSAMINE-CHONDROITIN 500-400 MG PO TABS: 1.0000 | ORAL_TABLET | Freq: Three times a day (TID) | ORAL | Status: DC

## 2013-12-30 MED ORDER — ZOLPIDEM TARTRATE 10 MG PO TABS: 10.0000 mg | ORAL_TABLET | Freq: Every evening | ORAL | Status: DC | PRN

## 2013-12-30 NOTE — Assessment & Plan Note (Signed)
-  ordered new CPAP machine

## 2013-12-30 NOTE — Telephone Encounter (Signed)
Called [facility] to see why her insurance is not covering her testing supplies. [facility] says they need her to bring in her red white and blue Medicare card because [name] is only part D medicines- not her Part B- DMEs. They need her Medicare B card to process the testing supplies.

## 2013-12-30 NOTE — Progress Notes (Signed)
Patient ID: Tiffany [name], female   DOB: Apr 27, 1951, 63 y.o.MRN: [id number]    Subjective:   Patient ID: Tiffany [name] female    DOB: 03-16-1951 63 y.o.    MRN: [id number] Health Maintenance Due: Health Maintenance Due  Topic Date Due  . Zostavax  07/11/2011  . Hemoglobin A1c  11/23/2013  . Influenza Vaccine  12/25/2013    _________________________________________________  HPI: [name] is a 63 y.o. female here for an acute visit for acute upper and chronic lower back pain.  Pt has a PMH outlined below.  Please see problem-based charting assessment and plan note for further details of medical issues addressed at today's visit.  PMH: Past Medical History  Diagnosis Date  . Diabetes mellitus   . Hypertension   . Hyperlipidemia   . Degenerative joint disease   . Sleep apnea     Medications: Current Outpatient Prescriptions on File Prior to Visit  Medication Sig Dispense Refill  . ACCU-CHEK FASTCLIX LANCETS MISC Use to check blood sugar as directed up to 3 times a day. diag code 250.02. Insulin dependent  102 each  6  . amLODipine (NORVASC) 5 MG tablet Take 1 tablet (5 mg total) by mouth daily.  30 tablet  6  . Blood Glucose Monitoring Suppl (ACCU-CHEK NANO SMARTVIEW) W/DEVICE KIT Use to check blood sugar as directed up to 3 times a day. diag code 250.02. Insulin dependent  1 kit  0  . cyclobenzaprine (FLEXERIL) 10 MG tablet Take 10 mg by mouth at bedtime.      . fluticasone (FLONASE) 50 MCG/ACT nasal spray Place 2 sprays into the nose daily.  16 g  11  . glimepiride (AMARYL) 4 MG tablet Take 1 tablet (4 mg total) by mouth daily before breakfast.  30 tablet  11  . glucose blood (ACCU-CHEK SMARTVIEW) test strip Use to check blood sugar as directed up to 3 times a day. diag code 250.02. Insulin dependent  100 each  12  . Insulin Glargine (LANTUS SOLOSTAR) 100 UNIT/ML Solostar Pen Inject 30 Units into the skin at bedtime.      . Insulin Pen Needle 32G X 4 MM MISC Use to  inject  lantus once daily  100 each  4  . methocarbamol (ROBAXIN) 500 MG tablet Take 1 tablet (500 mg total) by mouth 2 (two) times daily.  20 tablet  0  . oxyCODONE-acetaminophen (PERCOCET/ROXICET) 5-325 MG per tablet Take 1-2 tablets by mouth every 6 (six) hours as needed for severe pain.  15 tablet  0  . quinapril (ACCUPRIL) 40 MG tablet Take 1 tablet (40 mg total) by mouth daily.  90 tablet  3  . Ranitidine HCl (ZANTAC PO) Take 150 mg by mouth daily as needed (for heart burn).       . zolpidem (AMBIEN) 10 MG tablet Take 10 mg by mouth at bedtime as needed for sleep.       No current facility-administered medications on file prior to visit.    Allergies: Allergies  Allergen Reactions  . Metformin And Related     Severe nausea and vomiting  . Aspirin Nausea And Vomiting    Can take EC asa  . Contrast Media [Iodinated Diagnostic Agents] Nausea And Vomiting  . Shellfish Allergy Nausea And Vomiting    FH: Family History  Problem Relation Age of Onset  . Colon cancer Neg Hx   . Colon polyps Neg Hx   . Rectal cancer Neg Hx   .  Stomach cancer Neg Hx   . Hypertension Mother   . Diabetes Mother   . Thyroid disease Mother   . Heart disease Mother   . Kidney disease Mother   . Diabetes Sister   . Hypertension Brother   . Stroke Brother   . Heart disease Brother     SH: History   Social History  . Marital Status: Single    Spouse Name: N/A    Number of Children: N/A  . Years of Education: 8   Social History Main Topics  . Smoking status: Former Smoker    Quit date: 08/02/1990  . Smokeless tobacco: Never Used  . Alcohol Use: No  . Drug Use: No  . Sexual Activity: No   Other Topics Concern  . None   Social History Narrative  . None    Review of Systems: Constitutional: Negative for fever, chills and weight loss.  Eyes: Negative for blurred vision.  Respiratory: Negative for cough and shortness of breath.  Cardiovascular: Negative for chest pain, palpitations  and leg swelling.  Gastrointestinal: Negative for nausea, vomiting, abdominal pain, diarrhea, constipation and blood in stool.  Genitourinary: Negative for dysuria, urgency and frequency.  Musculoskeletal: Negative for myalgias and back pain.  Neurological: Negative for dizziness, weakness and headaches.     Objective:   Vital Signs: Filed Vitals:   12/30/13 1539 12/30/13 1540  BP:  148/98  Pulse:  90  Temp:  97.7 F (36.5 C)  TempSrc:  Oral  Height: 5' 3"  (1.6 m)   Weight: 169 lb 8 oz (76.885 kg)   SpO2:  98%      BP Readings from Last 3 Encounters:  12/30/13 148/98  12/08/13 140/72  10/25/13 149/93    Physical Exam: Constitutional: Vital signs reviewed.  Patient appears very upset coming in with her son.  She has a flat affect and is tearful at times during the exam.  She is somewhat cooperative with the exam although it is obvious that she is in significant pain.   Head: Normocephalic and atraumatic. Eyes: PERRL, EOMI, conjunctivae nl, no scleral icterus.  Neck: Supple. Cardiovascular: RRR, no MRG. Pulmonary/Chest: normal effort, non-tender to palpation, CTAB, no wheezes, rales, or rhonchi. Abdominal: Soft. NT/ND +BS. Neurological: A&O x3, cranial nerves II-XII are grossly intact, moving all extremities. Musculoskeletal: +SLR on left, 2+ patellar reflexes bilaterally, 2+ biceps reflexes bilaterally, 5/5 UE strength, 4/5 LE strength.  Decreased ROM of cervical spine.  Extremities: 2+DP b/l; no pitting edema. Skin: Warm, dry and intact. No rash.   Assessment & Plan:   Assessment and plan was discussed and formulated with my attending.

## 2013-12-30 NOTE — Assessment & Plan Note (Addendum)
BP today 148/98.   -continue accupril 40mg  and amlodipine 5mg 

## 2013-12-30 NOTE — Assessment & Plan Note (Addendum)
Pt c/o of chronic lower back pain and acute upper back pain.  She recently had a fall that she contributes to her bad knees.  Denies LOC, dizziness, or lightheadedness.  She went to the ED on 7/15 for the fall and was given robaxin and vicodin.  She reports they did not do any imaging of her upper back/cervical spine.  She had an MR of the lumbar spine in 2013 which showed degenerative changes most notable at L5-S1.  She has been to sports medicine with some relief given steroid injection and toradol.  She has tried gabapentin, amitryptline, cymbalta, and lidocaine patches without improvement. Sports management is recommending referral to a pain clinic which I do not think is warranted at this time.  She has been experiencing many stressors in her life currently such as her mother being in a nursing home and possibly having to go on HD.  She becomes tearful during the exam.  Also has cervical paraspinal tenderness.  I believe that she is suffering from depression which is exacerbating her pain.  She requests that I complete a MR of her entire spine.  She also c/o bilateral knee pain but states her major concern is her neck/upper back pain.   -MR of lumbar spine -XR of cervical spine -begin lexapro 10mg  daily for depression/chronic pain -trial of glucosamine/chondroitin sulfate for OA of bilateral knees -advised to try heat and warm baths for relaxation -naproxen 500mg  bid for anti-inflammatory along with ppx prilosec 20mg  daily  -refilled flexeril--continue as needed  -return to clinic in 4-6 weeks to reassess  -consider referral to [facility] for counseling or provide information for [facility] on next visit

## 2013-12-30 NOTE — Assessment & Plan Note (Signed)
-  refilled ambien 10mg  for 30 days

## 2013-12-30 NOTE — Assessment & Plan Note (Addendum)
-  gave pt zostavax prescription during last OV but she has not gotten it yet -will need prevnar 13 when turns 66

## 2013-12-30 NOTE — Patient Instructions (Signed)
Thank you for your visit today.   Please return to the internal medicine clinic in 4-6 weeks or sooner if needed.     Your current medical regimen is effective;  continue present plan and take all medications as prescribed.    I have made the following additions/changes to your medications:  added chondroitin sulfate and glucosamine--please take three times daily please take naproxen 500mg  twice daily for pain please take lexapro 10mg  daily to help with pain and depression I have ordered the MRI of your lumbar spine I have also ordered an XRay of your cervical spine   Please be sure to bring all of your medications with you to every visit; this includes herbal supplements, vitamins, eye drops, and any over-the-counter medications.   Should you have any questions regarding your medications and/or any new or worsening symptoms, please be sure to call the clinic at [phone].   If you believe that you are suffering from a life threatening condition or one that may result in the loss of limb or function, then you should call 911 or proceed to the nearest Emergency Department.     A healthy lifestyle and preventative care can promote health and wellness.   Maintain regular health, dental, and eye exams.  Eat a healthy diet. Foods like vegetables, fruits, whole grains, low-fat dairy products, and lean protein foods contain the nutrients you need without too many calories. Decrease your intake of foods high in solid fats, added sugars, and salt. Get information about a proper diet from your caregiver, if necessary.  Regular physical exercise is one of the most important things you can do for your health. Most adults should get at least 150 minutes of moderate-intensity exercise (any activity that increases your heart rate and causes you to sweat) each week. In addition, most adults need muscle-strengthening exercises on 2 or more days a week.   Maintain a healthy weight. The body mass  index (BMI) is a screening tool to identify possible weight problems. It provides an estimate of body fat based on height and weight. Your caregiver can help determine your BMI, and can help you achieve or maintain a healthy weight. For adults 20 years and older:  A BMI below 18.5 is considered underweight.  A BMI of 18.5 to 24.9 is normal.  A BMI of 25 to 29.9 is considered overweight.  A BMI of 30 and above is considered obese.

## 2013-12-30 NOTE — Assessment & Plan Note (Signed)
Lab Results  Component Value Date   HGBA1C 7.6 12/30/2013   HGBA1C 8.2 08/23/2013   HGBA1C 7.8 01/04/2013     Assessment: Diabetes control: fair control Progress toward A1C goal:  improved Comments: pt reports compliance with meds; has improved and is almost at goal--her meter broke and she has not been checking her blood sugars but denies any lows (reports lowest was about 70-80)  Plan: Medications:  continue current medications of glimepiride 4mg  and lantus 30 units  Home glucose monitoring:  will get new meter Instruction/counseling given: reminded to bring blood glucose meter & log to each visit Educational resources provided: brochure;handout

## 2014-01-04 NOTE — Progress Notes (Signed)
INTERNAL MEDICINE TEACHING ATTENDING ADDENDUM - Tiffany Manz, MD: I reviewed and discussed at the time of visit with the resident Dr. Gill, the patient's medical history, physical examination, diagnosis and results of pertinent tests and treatment and I agree with the patient's care as documented.  

## 2014-01-05 ENCOUNTER — Other Ambulatory Visit: Payer: Self-pay | Admitting: Internal Medicine

## 2014-01-05 NOTE — Progress Notes (Signed)
Rx for generic Ambien called in to pharmacy. Hilda Blades Tayla Panozzo RN 01/05/14 10:25AM

## 2014-01-06 ENCOUNTER — Encounter: Payer: Self-pay | Admitting: Internal Medicine

## 2014-01-06 NOTE — Telephone Encounter (Signed)
Rx called in 

## 2014-01-10 ENCOUNTER — Encounter: Payer: Self-pay | Admitting: Internal Medicine

## 2014-01-10 ENCOUNTER — Ambulatory Visit (INDEPENDENT_AMBULATORY_CARE_PROVIDER_SITE_OTHER): Payer: Medicare Other | Admitting: Internal Medicine

## 2014-01-10 VITALS — BP 135/88 | HR 90 | Temp 97.4°F | Ht 63.0 in | Wt 169.2 lb

## 2014-01-10 DIAGNOSIS — F32A Depression, unspecified: Secondary | ICD-10-CM

## 2014-01-10 DIAGNOSIS — F3289 Other specified depressive episodes: Secondary | ICD-10-CM

## 2014-01-10 DIAGNOSIS — M539 Dorsopathy, unspecified: Secondary | ICD-10-CM

## 2014-01-10 DIAGNOSIS — G4700 Insomnia, unspecified: Secondary | ICD-10-CM

## 2014-01-10 DIAGNOSIS — IMO0002 Reserved for concepts with insufficient information to code with codable children: Secondary | ICD-10-CM

## 2014-01-10 DIAGNOSIS — F329 Major depressive disorder, single episode, unspecified: Secondary | ICD-10-CM

## 2014-01-10 NOTE — Patient Instructions (Signed)
Thank you for your visit today.   Please return to the internal medicine clinic in 2-3 weeks.    Your current medical regimen is effective;  continue present plan and take all medications as prescribed.    Please call [facility] for an appointment.   Please be sure to bring all of your medications with you to every visit; this includes herbal supplements, vitamins, eye drops, and any over-the-counter medications.   Should you have any questions regarding your medications and/or any new or worsening symptoms, please be sure to call the clinic at [phone].   If you believe that you are suffering from a life threatening condition or one that may result in the loss of limb or function, then you should call 911 or proceed to the nearest Emergency Department.     A healthy lifestyle and preventative care can promote health and wellness.   Maintain regular health, dental, and eye exams.  Eat a healthy diet. Foods like vegetables, fruits, whole grains, low-fat dairy products, and lean protein foods contain the nutrients you need without too many calories. Decrease your intake of foods high in solid fats, added sugars, and salt. Get information about a proper diet from your caregiver, if necessary.  Regular physical exercise is one of the most important things you can do for your health. Most adults should get at least 150 minutes of moderate-intensity exercise (any activity that increases your heart rate and causes you to sweat) each week. In addition, most adults need muscle-strengthening exercises on 2 or more days a week.   Maintain a healthy weight. The body mass index (BMI) is a screening tool to identify possible weight problems. It provides an estimate of body fat based on height and weight. Your caregiver can help determine your BMI, and can help you achieve or maintain a healthy weight. For adults 20 years and older:  A BMI below 18.5 is considered  underweight.  A BMI of 18.5 to 24.9 is normal.  A BMI of 25 to 29.9 is considered overweight.  A BMI of 30 and above is considered obese.

## 2014-01-10 NOTE — Assessment & Plan Note (Addendum)
Had a long discussion with patient regarding the need for antidepressants and counseling.  She has chronic back pain and has been dealing with issues surrounding her mother's health which has been difficult for her.  She comes in today with her daughter who also expresses concern.  Patient is reluctant to go to counseling stating that I think she is crazy.  I told her this is not the case, I do not think she is crazy.  However, I do feel that due to pain and her circumstances she is also suffering from depression but does not wish to acknowledge this.  I encouraged her to take the lexapro that was prescribed during last visit. -encouraged compliance with lexapro; continue ambien for sleep as needed -provided info for [facility] -referral to social work for additional possibilities for counseling (pt has medicare) -follow up more closely (2-3 weeks)

## 2014-01-10 NOTE — Progress Notes (Signed)
Patient ID: Tiffany [name], female   DOB: January 28, 1951, 63 y.o.  MRN: [id number]    Subjective:   Patient ID: Tiffany [name] female    DOB: Dec 08, 1950 63 y.o.    MRN: [id number] Health Maintenance Due: Health Maintenance Due  Topic Date Due  . Lipid Panel  01/04/2014  . Influenza Vaccine  12/25/2013    _________________________________________________  HPI: Tiffany [name] is a 63 y.o. female here for a routine visit.  Pt has a PMH outlined below.  Please see problem-based charting assessment and plan note for further details of medical issues addressed at today's visit.  PMH: Past Medical History  Diagnosis Date  . Diabetes mellitus   . Hypertension   . Hyperlipidemia   . Degenerative joint disease   . Sleep apnea     Medications: Current Outpatient Prescriptions on File Prior to Visit  Medication Sig Dispense Refill  . ACCU-CHEK FASTCLIX LANCETS MISC Use to check blood sugar as directed up to 3 times a day. diag code 250.02. Insulin dependent  102 each  6  . amLODipine (NORVASC) 5 MG tablet Take 1 tablet (5 mg total) by mouth daily.  30 tablet  6  . Blood Glucose Monitoring Suppl (ACCU-CHEK NANO SMARTVIEW) W/DEVICE KIT Use to check blood sugar as directed up to 3 times a day. diag code 250.02. Insulin dependent  1 kit  0  . cyclobenzaprine (FLEXERIL) 5 MG tablet TAKE 1 TABLET BY MOUTH AT BEDTIME AS NEEDED FOR MUSCLE SPASMS.  15 tablet  0  . escitalopram (LEXAPRO) 10 MG tablet Take 1 tablet (10 mg total) by mouth daily.  30 tablet  1  . fluticasone (FLONASE) 50 MCG/ACT nasal spray Place 2 sprays into the nose daily.  16 g  11  . glimepiride (AMARYL) 4 MG tablet Take 1 tablet (4 mg total) by mouth daily before breakfast.  30 tablet  11  . glucosamine-chondroitin (MAX GLUCOSAMINE CHONDROITIN) 500-400 MG tablet Take 1 tablet by mouth 3 (three) times daily.  90 tablet  3  . glucose blood (ACCU-CHEK SMARTVIEW) test strip Use to check blood sugar as directed up to 3 times a day. diag  code 250.02. Insulin dependent  100 each  12  . Insulin Glargine (LANTUS SOLOSTAR) 100 UNIT/ML Solostar Pen Inject 30 Units into the skin at bedtime.      . Insulin Pen Needle 32G X 4 MM MISC Use to inject  lantus once daily  100 each  4  . naproxen (NAPROSYN) 500 MG tablet Take 1 tablet (500 mg total) by mouth 2 (two) times daily with a meal.  60 tablet  0  . omeprazole (PRILOSEC) 20 MG capsule Take 2 capsules (40 mg total) by mouth daily.  30 capsule  3  . quinapril (ACCUPRIL) 40 MG tablet Take 1 tablet (40 mg total) by mouth daily.  90 tablet  3  . Ranitidine HCl (ZANTAC PO) Take 150 mg by mouth daily as needed (for heart burn).       . zolpidem (AMBIEN) 10 MG tablet TAKE 1 TABLET AT BEDTIME AS NEEDED  30 tablet  0   No current facility-administered medications on file prior to visit.    Allergies: Allergies  Allergen Reactions  . Metformin And Related     Severe nausea and vomiting  . Aspirin Nausea And Vomiting    Can take EC asa  . Contrast Media [Iodinated Diagnostic Agents] Nausea And Vomiting  . Shellfish Allergy Nausea And Vomiting  FH: Family History  Problem Relation Age of Onset  . Colon cancer Neg Hx   . Colon polyps Neg Hx   . Rectal cancer Neg Hx   . Stomach cancer Neg Hx   . Hypertension Mother   . Diabetes Mother   . Thyroid disease Mother   . Heart disease Mother   . Kidney disease Mother   . Diabetes Sister   . Hypertension Brother   . Stroke Brother   . Heart disease Brother     SH: History   Social History  . Marital Status: Single    Spouse Name: N/A    Number of Children: N/A  . Years of Education: 8   Social History Main Topics  . Smoking status: Former Smoker    Quit date: 08/02/1990  . Smokeless tobacco: Never Used  . Alcohol Use: No  . Drug Use: No  . Sexual Activity: No   Other Topics Concern  . None   Social History Narrative  . None    Review of Systems: Constitutional: Negative for fever, chills and weight loss.    Eyes: Negative for blurred vision.  Respiratory: Negative for cough and shortness of breath.  Cardiovascular: Negative for chest pain, palpitations and leg swelling.  Gastrointestinal: Negative for nausea, vomiting, abdominal pain, diarrhea, constipation and blood in stool.  Genitourinary: Negative for dysuria, urgency and frequency.  Musculoskeletal: Negative for myalgias and back pain.  Neurological: Negative for dizziness, weakness and headaches.     Objective:   Vital Signs: Filed Vitals:   01/10/14 1528  BP: 135/88  Pulse: 90  Temp: 97.4 F (36.3 C)  TempSrc: Oral  Height: _0  (1.6 m)  Weight: 169 lb 3.2 oz (76.749 kg)  SpO2: 100%      BP Readings from Last 3 Encounters:  01/10/14 135/88  12/30/13 148/98  12/08/13 140/72    Physical Exam: Constitutional: Vital signs reviewed.  Patient is well-developed and well-nourished in NAD and cooperative with exam.  Head: Normocephalic and atraumatic. Eyes: PERRL, EOMI, conjunctivae nl, no scleral icterus.  Neck: Supple. Cardiovascular: RRR, no MRG. Pulmonary/Chest: normal effort, non-tender to palpation, CTAB, no wheezes, rales, or rhonchi. Abdominal: Soft. NT/ND +BS. Musculoskeletal: Paraspinal muscle tenderness and tightness.  Full ROM.  Nocyanosis,clubbing,oredema. Neurological: A&O x3, cranial nerves II-XII are grossly intact, moving all extremities, 2+ patellar reflexes b/l, 5/5 strength RU b/l, 5/5 strength LE.   Extremities: 2+DP b/l; no pitting edema. Skin: Warm, dry and intact. No rash.   Assessment & Plan:   Assessment and plan was discussed and formulated with my attending.

## 2014-01-10 NOTE — Assessment & Plan Note (Addendum)
Pt has multilevel DDD of the cervical and lumbar spine.  She reports daily pain and has difficulty performing her ADLs.  No red flags on physical exam.  Also reports having two episodes on Sat and Sun where she had paresthesia and weakness of the right side that lasted about 30 minutes and then completely resolved.  Daughter reports that the patient was out of it and had difficulty checking her blood sugar.  Initially thought her blood sugar was low but it was ~160.  She did not go to the ED.  No h/o CVA.  Advised pt that if she has another episode like this to call 911 and proceed to the nearest ED--she and daughter voice understanding.  I had a long discussion about the patient's back pain.  She has not tried naproxen twice daily as recommended and has not started lexapro.  I believe that the patient's chronic pain is exacerbated by depression and suggested counseling.  Pt is reluctant to try this but finally understands the importance.  She was seen in the ED recently for a fall due to OA of the knees bilaterally and was told to follow up with [facility] but she has not yet because she was afraid she would have a large copay.  I encouraged pt to follow up with them.  Also has not gotten her MRI of the lumbar spine ordered last visit. Pt is amenable to surgery and seems to want this. Also suggested PT but patient seems reluctant.  -follow up with neurosurgery  -begin lexapro  -continue flexeril at bedtime and naproxen 500mg  twice daily (along with PPI for prophylaxis)  -schedule MRI of lumbar -follow-up in 2-3 weeks

## 2014-01-11 NOTE — Progress Notes (Signed)
INTERNAL MEDICINE TEACHING ATTENDING ADDENDUM - Mairin Lindsley, MD: I reviewed and discussed at the time of visit with the resident Dr. Gill, the patient's medical history, physical examination, diagnosis and results of pertinent tests and treatment and I agree with the patient's care as documented.  

## 2014-01-17 ENCOUNTER — Telehealth: Payer: Self-pay | Admitting: Licensed Clinical Social Worker

## 2014-01-17 NOTE — Telephone Encounter (Signed)
Ms. Coxe was referred to CSW for in-network therapist referrals.  CSW utilized [facility], listing obtained.  CSW placed called to pt.  CSW left message requesting return call. CSW provided contact hours and phone number.

## 2014-01-19 ENCOUNTER — Ambulatory Visit (HOSPITAL_COMMUNITY)
Admission: RE | Admit: 2014-01-19 | Discharge: 2014-01-19 | Disposition: A | Discharge status: Home / Self Care | Payer: Medicare Other | Source: Ambulatory Visit | Attending: Internal Medicine | Admitting: Internal Medicine

## 2014-01-19 DIAGNOSIS — M47817 Spondylosis without myelopathy or radiculopathy, lumbosacral region: Secondary | ICD-10-CM | POA: Insufficient documentation

## 2014-01-19 DIAGNOSIS — M545 Low back pain, unspecified: Secondary | ICD-10-CM | POA: Diagnosis present

## 2014-01-19 DIAGNOSIS — M1990 Unspecified osteoarthritis, unspecified site: Secondary | ICD-10-CM

## 2014-01-19 DIAGNOSIS — M5126 Other intervertebral disc displacement, lumbar region: Secondary | ICD-10-CM | POA: Diagnosis not present

## 2014-01-19 DIAGNOSIS — Q619 Cystic kidney disease, unspecified: Secondary | ICD-10-CM | POA: Diagnosis not present

## 2014-01-24 ENCOUNTER — Telehealth: Payer: Self-pay | Admitting: Internal Medicine

## 2014-01-24 NOTE — Telephone Encounter (Signed)
Follow up Information.  [facility] has picked the order up for a New CPAP machine.  If this patient has any questions regarding her New CPAP Order she is asked to call [facility] at [phone].

## 2014-01-25 ENCOUNTER — Other Ambulatory Visit: Payer: Self-pay | Admitting: Internal Medicine

## 2014-01-25 NOTE — Telephone Encounter (Signed)
CSW placed called to pt.  CSW left message requesting return call. CSW provided contact hours and phone number. 

## 2014-01-26 ENCOUNTER — Encounter: Payer: Self-pay | Admitting: Licensed Clinical Social Worker

## 2014-01-26 NOTE — Telephone Encounter (Signed)
CSW placed called to pt.  CSW left message requesting return call. CSW provided contact hours and phone number. 

## 2014-01-26 NOTE — Telephone Encounter (Signed)
Letter mailed with [facility] provider, omitted inpatient providers known to this worker in addition to workers for the young adult population

## 2014-02-02 ENCOUNTER — Other Ambulatory Visit: Payer: Self-pay | Admitting: Internal Medicine

## 2014-02-22 ENCOUNTER — Encounter: Payer: Self-pay | Admitting: *Deleted

## 2014-03-07 ENCOUNTER — Encounter: Payer: Self-pay | Admitting: Family

## 2014-03-07 ENCOUNTER — Ambulatory Visit (INDEPENDENT_AMBULATORY_CARE_PROVIDER_SITE_OTHER): Payer: Medicare Other | Admitting: Family

## 2014-03-07 VITALS — BP 150/88 | HR 88 | Temp 98.4°F | Resp 18 | Ht 63.0 in | Wt 172.0 lb

## 2014-03-07 DIAGNOSIS — I10 Essential (primary) hypertension: Secondary | ICD-10-CM

## 2014-03-07 DIAGNOSIS — M5441 Lumbago with sciatica, right side: Secondary | ICD-10-CM

## 2014-03-07 DIAGNOSIS — G4700 Insomnia, unspecified: Secondary | ICD-10-CM

## 2014-03-07 DIAGNOSIS — M539 Dorsopathy, unspecified: Secondary | ICD-10-CM

## 2014-03-07 DIAGNOSIS — E1165 Type 2 diabetes mellitus with hyperglycemia: Secondary | ICD-10-CM

## 2014-03-07 DIAGNOSIS — IMO0001 Reserved for inherently not codable concepts without codable children: Secondary | ICD-10-CM

## 2014-03-07 DIAGNOSIS — M5442 Lumbago with sciatica, left side: Secondary | ICD-10-CM

## 2014-03-07 DIAGNOSIS — K219 Gastro-esophageal reflux disease without esophagitis: Secondary | ICD-10-CM

## 2014-03-07 MED ORDER — OXYCODONE-ACETAMINOPHEN 5-325 MG PO TABS: 1.0000 | ORAL_TABLET | Freq: Four times a day (QID) | ORAL | Status: DC | PRN

## 2014-03-07 NOTE — Assessment & Plan Note (Signed)
Reviewed notes from Dr. Gordy Levan. Discussed the importance of taking the Lexapro that was prescribed in order to see the effects of the medication. Pt indicates that she will consider it.

## 2014-03-07 NOTE — Progress Notes (Signed)
Pre visit review using our clinic review tool, if applicable. No additional management support is needed unless otherwise documented below in the visit note. 

## 2014-03-07 NOTE — Assessment & Plan Note (Addendum)
Currently stable on OTC omeprazole and ranitidine. Continue current therapy.

## 2014-03-07 NOTE — Patient Instructions (Signed)
Thank you for choosing [facility].  Summary/Instructions:  Please call the office with your glucose strip names Please make an appointment for about a month for your physical.  It was a pleasure to meet you!  Thank you for enrolling in [facility]. Please follow the instructions below to securely access your online medical record. MyChart allows you to send messages to your doctor, view your test results, renew your prescriptions, schedule appointments, and more.  How Do I Sign Up? 1. In your Internet browser, go to [email or web address]. 2. Click on the New  User? link in the Sign In box.  3. Enter your MyChart Access Code exactly as it appears below. You will not need to use this code after you have completed the sign-up process. If you do not sign up before the expiration date, you must request a new code. MyChart Access Code: [email or web address] Expires: 05/06/2014  2:01 PM  4. Enter the last four digits of your Social Security Number (xxxx) and Date of Birth (mm/dd/yyyy) as indicated and click Next. You will be taken to the next sign-up page. 5. Create a MyChart ID. This will be your MyChart login ID and cannot be changed, so think of one that is secure and easy to remember. 6. Create a MyChart password. You can change your password at any time. 7. Enter your Password Reset Question and Answer and click Next. This can be used at a later time if you forget your password.  8. Select your communication preference, and if applicable enter your e-mail address. You will receive e-mail notification when new information is available in MyChart by choosing to receive e-mail notifications and filling in your e-mail. 9. Click Sign In. You can now view your medical record.   Additional Information If you have questions, you can email [email or web address] or call [phone] to talk to our [facility] staff. Remember, MyChart is NOT to be used for urgent needs. For  medical emergencies, dial 911.

## 2014-03-07 NOTE — Assessment & Plan Note (Signed)
BP remains labile, however systolic pressure remains > 130 goal. Continue current quinapril. Will re-evaluate at next visit and consider adding an additional agent if pressures remain high. Will check kidney function at next visit.

## 2014-03-07 NOTE — Assessment & Plan Note (Signed)
Diabetes appears stable with current regimen. Will recheck A1c at next visit and perform diabetic foot exam. Continue Lantus 30 U daily. Pt instructed to contact office with test strip information to straighten out issues in obtaining test strips.

## 2014-03-07 NOTE — Assessment & Plan Note (Signed)
Will refer patient to orthopedics for further evaluation per her request. Pt requested pain meds to get her through to the visit. Will fill one time percocet for the patient. Instructed if further medications are needed will refer to pain management.

## 2014-03-07 NOTE — Progress Notes (Signed)
Subjective:    Patient ID: Tiffany [name], female    DOB: 10/15/1950, 63 y.o.   MRN: [id number]  HPI:  Tiffany [name] is a 63 y.o. female who presents today to establish care. She is transferring care from [facility] clinic.   1) Diabetes - Currently maintained on 30 U of Lantus, and Amaryl 4 mg. Has been having trouble obtaining glucose test strips secondary to cost and devices. Is currently up to date with her diabetic eye exam and is in need of her diabetic foot exam. Denies any adverse side effects, polydipisia, polyuria or polyphagia.   Lab Results  Component Value Date   HGBA1C 7.6 12/30/2013   2) Back spasms - maintained on Flexeril 10 mg at bedtime. Taken during the day when needed. Naprosyn when needed.  3) GERD - omeprazole as needed; taking ranitidine 2x daily. Currently stable  4) Hypertension - Currently on quinapril. Denies any cough or other adverse medication effects. Vision screen is up to date. Will evaluate kidney function at next visit. Denies chest pain/discomfort, shortness or breath, palpitations, or edema.   BP Readings from Last 3 Encounters:  03/07/14 150/88  01/10/14 135/88  12/30/13 148/98   5) Insomnia/Depression/Anxiety - Has had insomnia possibly related to her mothers health, anxiety and depression. Has been using Ambien for a couple of years now. Indicates she takes the Ambien to help her sleep 1-2x per week.   Allergies  Allergen Reactions  . Metformin And Related     Severe nausea and vomiting  . Aspirin Nausea And Vomiting    Can take EC asa  . Contrast Media [Iodinated Diagnostic Agents] Nausea And Vomiting  . Shellfish Allergy Nausea And Vomiting  . Tramadol Nausea And Vomiting    Migraine   Current Outpatient Prescriptions on File Prior to Visit  Medication Sig Dispense Refill  . ACCU-CHEK FASTCLIX LANCETS MISC Use to check blood sugar as directed up to 3 times a day. diag code 250.02. Insulin dependent  102 each  6  .  amLODipine (NORVASC) 5 MG tablet TAKE 1 TABLET EVERY DAY  30 tablet  6  . Blood Glucose Monitoring Suppl (ACCU-CHEK NANO SMARTVIEW) W/DEVICE KIT Use to check blood sugar as directed up to 3 times a day. diag code 250.02. Insulin dependent  1 kit  0  . cyclobenzaprine (FLEXERIL) 5 MG tablet TAKE 1 TABLET BY MOUTH AT BEDTIME AS NEEDED FOR MUSCLE SPASMS.  15 tablet  0  . escitalopram (LEXAPRO) 10 MG tablet Take 1 tablet (10 mg total) by mouth daily.  30 tablet  1  . glimepiride (AMARYL) 4 MG tablet Take 1 tablet (4 mg total) by mouth daily before breakfast.  30 tablet  11  . glucosamine-chondroitin (MAX GLUCOSAMINE CHONDROITIN) 500-400 MG tablet Take 1 tablet by mouth 3 (three) times daily.  90 tablet  3  . glucose blood (ACCU-CHEK SMARTVIEW) test strip Use to check blood sugar as directed up to 3 times a day. diag code 250.02. Insulin dependent  100 each  12  . Insulin Glargine (LANTUS SOLOSTAR) 100 UNIT/ML Solostar Pen Inject 30 Units into the skin at bedtime.      . Insulin Pen Needle 32G X 4 MM MISC Use to inject  lantus once daily  100 each  4  . naproxen (NAPROSYN) 500 MG tablet Take 1 tablet (500 mg total) by mouth 2 (two) times daily with a meal.  60 tablet  0  . omeprazole (PRILOSEC) 20  MG capsule Take 2 capsules (40 mg total) by mouth daily.  30 capsule  3  . quinapril (ACCUPRIL) 40 MG tablet Take 1 tablet (40 mg total) by mouth daily.  90 tablet  3  . Ranitidine HCl (ZANTAC PO) Take 150 mg by mouth daily as needed (for heart burn).       . zolpidem (AMBIEN) 10 MG tablet TAKE 1 TABLET AT BEDTIME AS NEEDED  30 tablet  0  . fluticasone (FLONASE) 50 MCG/ACT nasal spray Place 2 sprays into the nose daily.  16 g  11  . methocarbamol (ROBAXIN) 500 MG tablet        No current facility-administered medications on file prior to visit.   Past Medical History  Diagnosis Date  . Diabetes mellitus   . Hypertension   . Hyperlipidemia   . Degenerative joint disease   . Sleep apnea     Review of  Systems    See HPI Objective:     BP 150/88  Pulse 88  Temp(Src) 98.4 F (36.9 C) (Oral)  Resp 18  Ht 5' 3" (1.6 m)  Wt 172 lb (78.019 kg)  BMI 30.48 kg/m2  SpO2 97% Nursing note and vital signs reviewed.  Physical Exam  Constitutional: She is oriented to person, place, and time. She appears well-developed and well-nourished. No distress.  Cardiovascular: Normal rate, regular rhythm and normal heart sounds.   Pulmonary/Chest: Effort normal and breath sounds normal.  Neurological: She is alert and oriented to person, place, and time.  Skin: Skin is warm and dry.  Psychiatric: Her speech is normal and behavior is normal. Judgment and thought content normal. Her mood appears anxious. Cognition and memory are normal.      Assessment & Plan:

## 2014-03-11 ENCOUNTER — Other Ambulatory Visit: Payer: Self-pay

## 2014-03-24 ENCOUNTER — Other Ambulatory Visit: Payer: Self-pay | Admitting: Internal Medicine

## 2014-03-25 ENCOUNTER — Other Ambulatory Visit: Payer: Self-pay | Admitting: Internal Medicine

## 2014-03-25 NOTE — Telephone Encounter (Signed)
MD out of office 10.12.15 last office visit  9.2.15 med last sent to pharmacy #[id number]

## 2014-03-25 NOTE — Telephone Encounter (Signed)
OK X1 

## 2014-03-28 ENCOUNTER — Other Ambulatory Visit: Payer: Self-pay | Admitting: Internal Medicine

## 2014-03-28 NOTE — Telephone Encounter (Signed)
Pt transferred care to [facility]

## 2014-04-01 ENCOUNTER — Emergency Department (HOSPITAL_COMMUNITY): Payer: No Typology Code available for payment source

## 2014-04-01 ENCOUNTER — Emergency Department (HOSPITAL_COMMUNITY)
Admission: EM | Admit: 2014-04-01 | Discharge: 2014-04-02 | Disposition: A | Discharge status: Home / Self Care | Payer: No Typology Code available for payment source | Attending: Emergency Medicine | Admitting: Emergency Medicine

## 2014-04-01 ENCOUNTER — Encounter (HOSPITAL_COMMUNITY): Payer: Self-pay | Admitting: Emergency Medicine

## 2014-04-01 DIAGNOSIS — T07XXXA Unspecified multiple injuries, initial encounter: Secondary | ICD-10-CM

## 2014-04-01 DIAGNOSIS — E119 Type 2 diabetes mellitus without complications: Secondary | ICD-10-CM | POA: Diagnosis not present

## 2014-04-01 DIAGNOSIS — S199XXA Unspecified injury of neck, initial encounter: Secondary | ICD-10-CM | POA: Diagnosis not present

## 2014-04-01 DIAGNOSIS — Z87891 Personal history of nicotine dependence: Secondary | ICD-10-CM | POA: Insufficient documentation

## 2014-04-01 DIAGNOSIS — S99911A Unspecified injury of right ankle, initial encounter: Secondary | ICD-10-CM | POA: Insufficient documentation

## 2014-04-01 DIAGNOSIS — Y92410 Unspecified street and highway as the place of occurrence of the external cause: Secondary | ICD-10-CM | POA: Diagnosis not present

## 2014-04-01 DIAGNOSIS — S0990XA Unspecified injury of head, initial encounter: Secondary | ICD-10-CM | POA: Diagnosis present

## 2014-04-01 DIAGNOSIS — Z791 Long term (current) use of non-steroidal anti-inflammatories (NSAID): Secondary | ICD-10-CM | POA: Insufficient documentation

## 2014-04-01 DIAGNOSIS — Z79899 Other long term (current) drug therapy: Secondary | ICD-10-CM | POA: Insufficient documentation

## 2014-04-01 DIAGNOSIS — Z794 Long term (current) use of insulin: Secondary | ICD-10-CM | POA: Diagnosis not present

## 2014-04-01 DIAGNOSIS — S8992XA Unspecified injury of left lower leg, initial encounter: Secondary | ICD-10-CM | POA: Diagnosis not present

## 2014-04-01 DIAGNOSIS — T07 Unspecified multiple injuries: Secondary | ICD-10-CM | POA: Insufficient documentation

## 2014-04-01 DIAGNOSIS — G8929 Other chronic pain: Secondary | ICD-10-CM | POA: Insufficient documentation

## 2014-04-01 DIAGNOSIS — Y9389 Activity, other specified: Secondary | ICD-10-CM | POA: Insufficient documentation

## 2014-04-01 DIAGNOSIS — M1990 Unspecified osteoarthritis, unspecified site: Secondary | ICD-10-CM | POA: Insufficient documentation

## 2014-04-01 MED ORDER — ONDANSETRON 4 MG PO TBDP
8.0000 mg | ORAL_TABLET | Freq: Once | ORAL | Status: AC
Start: 2014-04-01 — End: 2014-04-01
  Administered 2014-04-01: 8 mg via ORAL
  Filled 2014-04-01: qty 2

## 2014-04-01 MED ORDER — HYDROMORPHONE HCL 1 MG/ML IJ SOLN
2.0000 mg | Freq: Once | INTRAMUSCULAR | Status: AC
  Administered 2014-04-01: 2 mg via INTRAMUSCULAR
  Filled 2014-04-01: qty 2

## 2014-04-01 MED ORDER — ONDANSETRON 4 MG PO TBDP
8.0000 mg | ORAL_TABLET | Freq: Once | ORAL | Status: AC
  Administered 2014-04-02: 8 mg via ORAL
  Filled 2014-04-01: qty 2

## 2014-04-01 NOTE — ED Notes (Signed)
Pt c/o right upper leg and left hip pain.  Dr. Eulis Foster made aware.
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
## 2014-04-01 NOTE — ED Notes (Signed)
Pt to x-ray and CT at this time.

## 2014-04-01 NOTE — ED Notes (Signed)
Pt c/o nausea, Meds ordered and given
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
## 2014-04-02 DIAGNOSIS — S0990XA Unspecified injury of head, initial encounter: Secondary | ICD-10-CM | POA: Diagnosis not present

## 2014-04-02 MED ORDER — OXYCODONE-ACETAMINOPHEN 5-325 MG PO TABS: 1.0000 | ORAL_TABLET | ORAL | Status: DC | PRN

## 2014-04-02 MED ORDER — PROMETHAZINE HCL 25 MG PO TABS: 25.0000 mg | ORAL_TABLET | Freq: Four times a day (QID) | ORAL | Status: DC | PRN
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
## 2014-04-11 ENCOUNTER — Other Ambulatory Visit (INDEPENDENT_AMBULATORY_CARE_PROVIDER_SITE_OTHER): Payer: Medicare Other

## 2014-04-11 ENCOUNTER — Encounter: Payer: Self-pay | Admitting: Family

## 2014-04-11 ENCOUNTER — Ambulatory Visit (INDEPENDENT_AMBULATORY_CARE_PROVIDER_SITE_OTHER): Payer: Medicare Other | Admitting: Family

## 2014-04-11 VITALS — BP 142/90 | Temp 98.1°F | Resp 16 | Ht 63.0 in | Wt 163.0 lb

## 2014-04-11 DIAGNOSIS — S93409A Sprain of unspecified ligament of unspecified ankle, initial encounter: Secondary | ICD-10-CM | POA: Insufficient documentation

## 2014-04-11 DIAGNOSIS — E1165 Type 2 diabetes mellitus with hyperglycemia: Secondary | ICD-10-CM

## 2014-04-11 DIAGNOSIS — E119 Type 2 diabetes mellitus without complications: Secondary | ICD-10-CM

## 2014-04-11 DIAGNOSIS — IMO0001 Reserved for inherently not codable concepts without codable children: Secondary | ICD-10-CM

## 2014-04-11 DIAGNOSIS — S93401D Sprain of unspecified ligament of right ankle, subsequent encounter: Secondary | ICD-10-CM

## 2014-04-11 LAB — HEMOGLOBIN A1C: Hgb A1c MFr Bld: 10.2 % — ABNORMAL HIGH (ref 4.6–6.5)

## 2014-04-11 MED ORDER — CYCLOBENZAPRINE HCL 5 MG PO TABS: ORAL_TABLET | ORAL | Status: DC

## 2014-04-11 MED ORDER — GLUCOSE BLOOD VI STRP: ORAL_STRIP | Status: DC

## 2014-04-11 NOTE — Assessment & Plan Note (Signed)
Previous A1c remains elevated. Increase Lantus to 35 units and titrate as able 1-2 units increase or decrease as needed to work towards goal of 120-140. IF greater than 45, instructed to call for further instructions. Follow up in 3 months. Obtain A1c and BMET. Diabetic foot exam completed.

## 2014-04-11 NOTE — Patient Instructions (Addendum)
Thank you for choosing [facility].  Summary/Instructions:  Your prescription(s) have been submitted to your pharmacy. Please take as directed and contact our office if you believe you are having problem(s) with the medication(s).  Please stop by the lab on the basement level of the building for your blood work. Your results will be released to [place] (or called to you) after review, usually within 72hours after test completion. If any changes need to be made, you will be notified at that same time.  Increase Lantus to 35 - increase/decrease 1-2 units depending on blood sugars.   If your symptoms worsen or fail to improve, please contact our office for further instruction, or in case of emergency go directly to the emergency room at the closest medical facility.   Ankle - Rest, ice (2-3 x daily for 20 minutes), compression, elevation as tolerated. Draw the alphabet multiple times per day. Stretch your calf multiple times per day. Use the tylenol as needed.   Ankle Exercises for Rehabilitation Following ankle injuries, it is as important to follow your caregiver's instructions for regaining full use of your ankle as it was to follow the initial treatment plan following the injury. The following are some suggestions for exercises and treatment, which can be done to help you regain full use of your ankle as soon as possible.  Follow all instructions regarding physical therapy.  Before exercising, it may be helpful to use heat on the muscles or joint being exercised. This loosens up the muscles and tendons (cordlike structure) and decreases chances of injury during your exercises. If this is not possible, just begin your exercises slowly to gradually warm up.  Stand on your toes several times per day to strengthen the calf muscles. These are the muscles in the back of your leg between the knee and the heel. The cord you can feel just above the heel is the Achilles tendon. Rise up on your  toes several times repeating this three to four times per day. Do not exercise to the point of pain. If pain starts to develop, decrease the exercise until you are comfortable again.  Do range of motion exercises. This means moving the ankle in all directions. Practice writing the alphabet with your toes in the air. Do not increase beyond a range that is comfortable.  Increase the strength of the muscles in the front of your leg by raising your toes and foot straight up in the air. Repeat this exercise as you did the calf exercise with the same warnings. This also help to stretch your muscles.  Stretch your calf muscles also by leaning against a wall with your hands in front of you. Put your feet a few feet from the wall and bend your knees until you feel the muscles in your calves become tight.  After exercising it may be helpful to put ice on the ankle to prevent swelling and improve rehabilitation. This may be done for 15 to 20 minutes following your exercises. If exercising is being done in the workplace, this may not always be possible.  Taping an ankle injury may be helpful to give added support following an injury. It also may help prevent reinjury. This may be true if you are in training or in a conditioning program. You and your caregiver can decide on the best course of action to follow. Document Released: 05/10/2000 Document Revised: 09/27/2013 Document Reviewed: 05/07/2008 [facility] Patient Information 2015 [place], [place]. This information is not intended to replace advice  given to you by your health care provider. Make sure you discuss any questions you have with your health care provider.  Ankle Exercises EXERCISES RANGE OF MOTION (ROM) AND STRETCHING EXERCISES These exercises may help you when beginning to rehabilitate your injury. Your symptoms may resolve with or without further involvement from your physician, physical therapist or athletic trainer. While completing these  exercises, remember:   Restoring tissue flexibility helps normal motion to return to the joints. This allows healthier, less painful movement and activity.  An effective stretch should be held for at least 30 seconds.  A stretch should never be painful. You should only feel a gentle lengthening or release in the stretched tissue. RANGE OF MOTION - Dorsi/Plantar Flexion  While sitting with your right / left knee straight, draw the top of your foot upwards by flexing your ankle. Then reverse the motion, pointing your toes downward.  Hold each position for __________ seconds.  After completing your first set of exercises, repeat this exercise with your knee bent. Repeat __________ times. Complete this exercise __________ times per day.  RANGE OF MOTION - Ankle Alphabet  Imagine your right / left big toe is a pen.  Keeping your hip and knee still, write out the entire alphabet with your "pen." Make the letters as large as you can without increasing any discomfort. Repeat __________ times. Complete this exercise __________ times per day.  RANGE OF MOTION - Ankle Dorsiflexion, Active Assisted   Remove shoes and sit on a chair that is preferably not on a carpeted surface.  Place right / left foot under knee. Extend your opposite leg for support.  Keeping your heel down, slide your right / left foot back toward the chair until you feel a stretch at your ankle or calf. If you do not feel a stretch, slide your bottom forward to the edge of the chair while still keeping your heel down.  Hold this stretch for __________ seconds. Repeat __________ times. Complete this stretch __________ times per day.  STRENGTHENING EXERCISES  These exercises may help you when beginning to rehabilitate your injury. They may resolve your symptoms with or without further involvement from your physician, physical therapist or athletic trainer. While completing these exercises, remember:   Muscles can gain both the  endurance and the strength needed for everyday activities through controlled exercises.  Complete these exercises as instructed by your physician, physical therapist or athletic trainer. Progress the resistance and repetitions only as guided.  You may experience muscle soreness or fatigue, but the pain or discomfort you are trying to eliminate should never worsen during these exercises. If this pain does worsen, stop and make certain you are following the directions exactly. If the pain is still present after adjustments, discontinue the exercise until you can discuss the trouble with your clinician. STRENGTH - Dorsiflexors  Secure a rubber exercise band/tubing to a fixed object (table, pole) and loop the other end around your right / left foot.  Sit on the floor facing the fixed object. The band/tubing should be slightly tense when your foot is relaxed.  Slowly draw your foot back toward you using your ankle and toes.  Hold this position for __________ seconds. Slowly release the tension in the band and return your foot to the starting position. Repeat __________ times. Complete this exercise __________ times per day.  STRENGTH - Plantar-flexors  Sit with your right / left leg extended. Holding onto both ends of a rubber exercise band/tubing, loop it around  the ball of your foot. Keep a slight tension in the band.  Slowly push your toes away from you, pointing them downward.  Hold this position for __________ seconds. Return slowly, controlling the tension in the band/tubing. Repeat __________ times. Complete this exercise __________ times per day.  STRENGTH - Ankle Eversion  Secure one end of a rubber exercise band/tubing to a fixed object (table, pole). Loop the other end around your foot just before your toes.  Place your fists between your knees. This will focus your strengthening at your ankle.  Drawing the band/tubing across your opposite foot, slowly, pull your little toe out and  up. Make sure the band/tubing is positioned to resist the entire motion.  Hold this position for __________ seconds.  Have your muscles resist the band/tubing as it slowly pulls your foot back to the starting position. Repeat __________ times. Complete this exercise __________ times per day.  STRENGTH - Ankle Inversion  Secure one end of a rubber exercise band/tubing to a fixed object (table, pole). Loop the other end around your foot just before your toes.  Place your fists between your knees. This will focus your strengthening at your ankle.  Slowly, pull your big toe up and in, making sure the band/tubing is positioned to resist the entire motion.  Hold this position for __________ seconds.  Have your muscles resist the band/tubing as it slowly pulls your foot back to the starting position. Repeat __________ times. Complete this exercises __________ times per day.  STRENGTH - Towel Curls  Sit in a chair positioned on a non-carpeted surface.  Place your foot on a towel, keeping your heel on the floor.  Pull the towel toward your heel by only curling your toes. Keep your heel on the floor. If instructed by your physician, physical therapist or athletic trainer, add weight to the end of the towel. Repeat __________ times. Complete this exercise __________ times per day. STRENGTH - Plantar-flexors, Standing  Stand with your feet shoulder width apart. Steady yourself with a wall or table using as little support as needed.  Keeping your weight evenly spread over the width of your feet, rise up on your toes.*  Hold this position for __________ seconds. Repeat __________ times. Complete this exercise __________ times per day.  *If this is too easy, shift your weight toward your right / left leg until you feel challenged. Ultimately, you may be asked to do this exercise with your right / left foot only. BALANCE - Tandem Walking  Place your uninjured foot on a line 2-4 inches wide and  at least 10 feet long.  Keeping your balance without using anything for extra support, place your right / left heel directly in front of your other foot.  Slowly raise your back foot up, lifting from the heel to the toes, and place it directly in front of the right / left foot.  Continue to walk along the line slowly. Walk for ____________________ feet. Repeat ____________________ times. Complete ____________________ times per day. Document Released: 03/27/2005 Document Revised: 08/05/2011 Document Reviewed: 08/25/2008 [facility] Patient Information 2015 [place], [place]. This information is not intended to replace advice given to you by your health care provider. Make sure you discuss any questions you have with your health care provider.

## 2014-04-11 NOTE — Assessment & Plan Note (Signed)
Moderate ankle sprain. Continue supportive treatment including rest, ice, compression and elevation. Instructed on home exercise program and to stretch her Achilles regularly. If symptoms worsen of fail to improve will consider referral to physical therapy or sports medicine.
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
## 2014-04-11 NOTE — Progress Notes (Signed)
Pre visit review using our clinic review tool, if applicable. No additional management support is needed unless otherwise documented below in the visit note. 

## 2014-04-12 ENCOUNTER — Encounter: Payer: Self-pay | Admitting: Family

## 2014-04-12 LAB — BASIC METABOLIC PANEL
BUN: 17 mg/dL (ref 6–23)
CHLORIDE: 102 meq/L (ref 96–112)
CO2: 25 meq/L (ref 19–32)
Calcium: 9.4 mg/dL (ref 8.4–10.5)
Creatinine, Ser: 0.7 mg/dL (ref 0.4–1.2)
GFR: 102.02 mL/min (ref >60.00)
Glucose, Bld: 236 mg/dL — ABNORMAL HIGH (ref 70–99)
Potassium: 4.6 mEq/L (ref 3.5–5.1)
SODIUM: 135 meq/L (ref 135–145)

## 2014-05-07 ENCOUNTER — Other Ambulatory Visit: Payer: Self-pay | Admitting: Internal Medicine

## 2014-06-02 ENCOUNTER — Other Ambulatory Visit: Payer: Self-pay | Admitting: Family

## 2014-06-20 ENCOUNTER — Other Ambulatory Visit: Payer: Self-pay | Admitting: Internal Medicine

## 2014-07-02 ENCOUNTER — Other Ambulatory Visit: Payer: Self-pay | Admitting: Internal Medicine

## 2014-07-04 ENCOUNTER — Other Ambulatory Visit: Payer: Self-pay | Admitting: Internal Medicine

## 2014-07-31 ENCOUNTER — Other Ambulatory Visit: Payer: Self-pay | Admitting: Internal Medicine

## 2014-09-26 ENCOUNTER — Other Ambulatory Visit: Payer: Self-pay | Admitting: Family

## 2014-09-26 NOTE — Telephone Encounter (Signed)
Needs OV - has not been seen since November.

## 2014-10-07 ENCOUNTER — Ambulatory Visit: Payer: Self-pay | Admitting: Family

## 2014-10-07 DIAGNOSIS — Z0289 Encounter for other administrative examinations: Secondary | ICD-10-CM

## 2014-10-13 ENCOUNTER — Other Ambulatory Visit (INDEPENDENT_AMBULATORY_CARE_PROVIDER_SITE_OTHER): Payer: Medicare Other

## 2014-10-13 ENCOUNTER — Ambulatory Visit (INDEPENDENT_AMBULATORY_CARE_PROVIDER_SITE_OTHER): Payer: Medicare Other | Admitting: Family

## 2014-10-13 ENCOUNTER — Encounter: Payer: Self-pay | Admitting: Family

## 2014-10-13 VITALS — BP 138/82 | HR 91 | Temp 98.0°F | Resp 18 | Ht 63.0 in | Wt 157.4 lb

## 2014-10-13 DIAGNOSIS — I10 Essential (primary) hypertension: Secondary | ICD-10-CM | POA: Diagnosis not present

## 2014-10-13 DIAGNOSIS — IMO0001 Reserved for inherently not codable concepts without codable children: Secondary | ICD-10-CM

## 2014-10-13 DIAGNOSIS — M539 Dorsopathy, unspecified: Secondary | ICD-10-CM | POA: Diagnosis not present

## 2014-10-13 DIAGNOSIS — E1165 Type 2 diabetes mellitus with hyperglycemia: Secondary | ICD-10-CM | POA: Diagnosis not present

## 2014-10-13 LAB — HEMOGLOBIN A1C: Hgb A1c MFr Bld: 11 % — ABNORMAL HIGH (ref 4.6–6.5)

## 2014-10-13 MED ORDER — AMLODIPINE BESYLATE 5 MG PO TABS: 5.0000 mg | ORAL_TABLET | Freq: Every day | ORAL | Status: DC

## 2014-10-13 MED ORDER — LIDOCAINE 5 % EX PTCH: 1.0000 | MEDICATED_PATCH | CUTANEOUS | Status: DC

## 2014-10-13 MED ORDER — ZOLPIDEM TARTRATE 10 MG PO TABS: 10.0000 mg | ORAL_TABLET | Freq: Every evening | ORAL | Status: DC | PRN

## 2014-10-13 MED ORDER — QUINAPRIL HCL 40 MG PO TABS: 40.0000 mg | ORAL_TABLET | Freq: Every day | ORAL | Status: DC

## 2014-10-13 NOTE — Progress Notes (Signed)
Pre visit review using our clinic review tool, if applicable. No additional management support is needed unless otherwise documented below in the visit note. 

## 2014-10-13 NOTE — Assessment & Plan Note (Signed)
Continues to experience chronic low back pain. Patient was noncompliant with visiting orthopedics indicating the cost of the visit. Scheduling notes indicate patient never called office. Symptoms and exam consistent with chronic protruding disc as seen on previous MRI. Refer to neurosurgery for further workup. Start lidocaine patches as needed for pain. Follow-up pending neurosurgery referral.

## 2014-10-13 NOTE — Patient Instructions (Signed)
Thank you for choosing [facility].  Summary/Instructions:  Your prescription(s) have been submitted to your pharmacy or been printed and provided for you. Please take as directed and contact our office if you believe you are having problem(s) with the medication(s) or have any questions.  Please stop by the lab on the basement level of the building for your blood work. Your results will be released to MyChart (or called to you) after review, usually within 72 hours after test completion. If any changes need to be made, you will be notified at that same time.  Referrals have been made during this visit. You should expect to hear back from our schedulers in about 7-10 days in regards to establishing an appointment with the specialists we discussed.   If your symptoms worsen or fail to improve, please contact our office for further instruction, or in case of emergency go directly to the emergency room at the closest medical facility.     

## 2014-10-13 NOTE — Assessment & Plan Note (Signed)
Diabetes previously known to be uncontrolled with an A1c of 10.3. Has not been able to take her blood sugar secondary to cost of test strips. She will speak with the pharmacist regarding pricing of alternative meters and strips to determine the most cost effective options. Obtain A1c and complete metabolic panel. Continue current dosages of Lantus at 36 units and glimepiride 4 mg pending A1c results.

## 2014-10-13 NOTE — Assessment & Plan Note (Addendum)
Stable with current regimen and below goal of 140/90. Denies adverse side effects of medication. Continue current dosages of quinapril and amlodipine.

## 2014-10-13 NOTE — Progress Notes (Signed)
Subjective:    Patient ID: Tiffany [name], female    DOB: 03-Nov-1950, 64 y.o.   MRN: [id number]  Chief Complaint  Patient presents with  . Back Pain    having pain in lower back, starts at her neck and moves down back, but its mostly in her lower back, x2 or 3 months    HPI:  Tiffany [name] is a 64 y.o. female with a PMH of hyperlipidemia, type 2 diabetes, sleep apnea, depression, and GERD who presents today for a follow-up office visit.  1) Chronic back pain - Previously seen in the office for low back pain that occasionally radiates to both legs. Continuing to have symptoms of pain located in her lower back that has been going on for several months. Pain is described as sharp and severity of 15/10. Functionality is decreased indicating that she is having difficulty rolling over, laying down and walking. Modifying factors include heat pads, ice and Tylenol which help minimally. Previous imaging reveals chronic disc protrusion and or scarring at L5-S1. She was scheduled to see orthopedics, but that did not happen. Other attempts at therapy have failed. Has also tried cymbalta, amitriptyline and lidocaine patches.   2) Diabetes - Currently taking 36 units of lantus and 4 mg of Amaryl. Has been unable to take her blood sugar at home secondary to not having test strips and the price. Denies any hypoglycemic events or other adverse symptoms.   Lab Results  Component Value Date   HGBA1C 10.2* 04/11/2014    3) Hypertension - Stable with current regimen of amlodipine and quinapril. Takes the medication as prescribed. Denies adverse side effects.   BP Readings from Last 3 Encounters:  10/13/14 140/82  04/11/14 142/90  04/02/14 137/73    Allergies  Allergen Reactions  . Metformin And Related     Severe nausea and vomiting  . Aspirin Nausea And Vomiting    Can take EC asa  . Contrast Media [Iodinated Diagnostic Agents] Nausea And Vomiting  . Shellfish Allergy Nausea And Vomiting  .  Tramadol Nausea And Vomiting    Migraine    Current Outpatient Prescriptions on File Prior to Visit  Medication Sig Dispense Refill  . ACCU-CHEK FASTCLIX LANCETS MISC Use to check blood sugar as directed up to 3 times a day. diag code 250.02. Insulin dependent 102 each 6  . amLODipine (NORVASC) 5 MG tablet TAKE 1 TABLET EVERY DAY 30 tablet 6  . Blood Glucose Monitoring Suppl (ACCU-CHEK NANO SMARTVIEW) W/DEVICE KIT Use to check blood sugar as directed up to 3 times a day. diag code 250.02. Insulin dependent 1 kit 0  . cyclobenzaprine (FLEXERIL) 5 MG tablet TAKE 1-2 TABLET BY MOUTH AT BEDTIME AS NEEDED FOR MUSCLE SPASMS. 30 tablet 0  . escitalopram (LEXAPRO) 10 MG tablet Take 1 tablet (10 mg total) by mouth daily. 30 tablet 1  . glimepiride (AMARYL) 4 MG tablet TAKE 1 TABLET (4 MG TOTAL) BY MOUTH DAILY BEFORE BREAKFAST. 30 tablet 5  . glucosamine-chondroitin (MAX GLUCOSAMINE CHONDROITIN) 500-400 MG tablet Take 1 tablet by mouth 3 (three) times daily. 90 tablet 3  . glucose blood test strip Use as instructed 100 each 12  . Insulin Glargine (LANTUS SOLOSTAR) 100 UNIT/ML Solostar Pen Inject 30 Units into the skin at bedtime.    . Insulin Pen Needle 32G X 4 MM MISC Use to inject  lantus once daily 100 each 4  . LANTUS SOLOSTAR 100 UNIT/ML Solostar Pen INJECT 30 UNITS SUBCUTANEOUSLY AT  BEDTIME 15 pen 5  . methocarbamol (ROBAXIN) 500 MG tablet Take 500 mg by mouth every 6 (six) hours as needed for muscle spasms.     . naproxen (NAPROSYN) 500 MG tablet Take 1 tablet (500 mg total) by mouth 2 (two) times daily with a meal. (Patient taking differently: Take 500 mg by mouth daily as needed for mild pain. ) 60 tablet 0  . omeprazole (PRILOSEC) 20 MG capsule Take 2 capsules (40 mg total) by mouth daily. 30 capsule 3  . oxyCODONE-acetaminophen (PERCOCET) 5-325 MG per tablet Take 1 tablet by mouth every 4 (four) hours as needed for severe pain. 20 tablet 0  . promethazine (PHENERGAN) 25 MG tablet Take 1 tablet  (25 mg total) by mouth every 6 (six) hours as needed for nausea or vomiting. 30 tablet 0  . zolpidem (AMBIEN) 10 MG tablet TAKE 1 TABLET AT BEDTIME AS NEEDED (Patient taking differently: TAKE 1 TABLET AT BEDTIME AS NEEDED  FOR SLEEP) 30 tablet 0  . quinapril (ACCUPRIL) 40 MG tablet Take 1 tablet (40 mg total) by mouth daily. 90 tablet 3   No current facility-administered medications on file prior to visit.    Past Medical History  Diagnosis Date  . Diabetes mellitus   . Hypertension   . Hyperlipidemia   . Degenerative joint disease   . Sleep apnea     Review of Systems  Musculoskeletal: Positive for back pain.  Neurological: Negative for numbness.      Objective:    BP 138/82 mmHg  Pulse 91  Temp(Src) 98 F (36.7 C) (Oral)  Resp 18  Ht _0  (1.6 m)  Wt 157 lb 6.4 oz (71.396 kg)  BMI 27.89 kg/m2  SpO2 99% Nursing note and vital signs reviewed.  Physical Exam  Constitutional: She is oriented to person, place, and time. She appears well-developed and well-nourished. No distress.  Cardiovascular: Normal rate, regular rhythm, normal heart sounds and intact distal pulses.   Pulmonary/Chest: Effort normal and breath sounds normal.  Musculoskeletal:  No obvious deformity, discoloration, or edema of low back noted. Trouble tenderness along the right lumbar paraspinal musculature at the level of L4. Patient has decreased flexion, extension, and lateral bending. Straight leg raise is positive on the left. Distal pulses, sensation, and reflexes are intact and appropriate.  Neurological: She is alert and oriented to person, place, and time.  Skin: Skin is warm and dry.  Psychiatric: She has a normal mood and affect. Her behavior is normal. Judgment and thought content normal.       Assessment & Plan:

## 2014-10-14 LAB — COMPREHENSIVE METABOLIC PANEL
ALK PHOS: 97 U/L (ref 39–117)
ALT: 17 U/L (ref 0–35)
AST: 14 U/L (ref 0–37)
Albumin: 4.1 g/dL (ref 3.5–5.2)
BILIRUBIN TOTAL: 0.4 mg/dL (ref 0.2–1.2)
BUN: 9 mg/dL (ref 6–23)
CO2: 24 mEq/L (ref 19–32)
Calcium: 9.4 mg/dL (ref 8.4–10.5)
Chloride: 99 mEq/L (ref 96–112)
Creatinine, Ser: 0.65 mg/dL (ref 0.40–1.20)
GFR: 118.3 mL/min (ref >60.00)
Glucose, Bld: 294 mg/dL — ABNORMAL HIGH (ref 70–99)
POTASSIUM: 3.8 meq/L (ref 3.5–5.1)
Sodium: 134 mEq/L — ABNORMAL LOW (ref 135–145)
Total Protein: 8.7 g/dL — ABNORMAL HIGH (ref 6.0–8.3)

## 2014-10-17 ENCOUNTER — Encounter: Payer: Self-pay | Admitting: Family

## 2014-10-18 ENCOUNTER — Telehealth: Payer: Self-pay | Admitting: Family

## 2014-10-18 ENCOUNTER — Other Ambulatory Visit: Payer: Self-pay | Admitting: Internal Medicine

## 2014-10-18 NOTE — Telephone Encounter (Signed)
Pt calel

## 2014-10-27 ENCOUNTER — Telehealth: Payer: Self-pay | Admitting: Family

## 2014-10-27 NOTE — Telephone Encounter (Signed)
Tiffany [name], [phone]  The Pa lidocaine patches, tier exspection has been denied

## 2014-10-27 NOTE — Telephone Encounter (Signed)
Patient is requesting to be given pain medication while script for lidocaine patches gets reviewed. She advised that she is in pain.

## 2014-10-27 NOTE — Telephone Encounter (Signed)
PA for lidocaine patch was denied. Is there anything else that can be sent in for pt. Please advise

## 2014-10-28 NOTE — Telephone Encounter (Signed)
Has she ever taken any other medication, because all of the additional pain medications are in a similar family to tramadol which she is allergic to.

## 2014-10-31 MED ORDER — ACETAMINOPHEN-CODEINE #3 300-30 MG PO TABS: 1.0000 | ORAL_TABLET | Freq: Three times a day (TID) | ORAL | Status: DC | PRN

## 2014-10-31 NOTE — Telephone Encounter (Signed)
Pt aware of results and that medication is ready for pick up. Says grandson or granddaughter will be picking it up

## 2014-10-31 NOTE — Telephone Encounter (Signed)
Hydrocodone and tylenol 3 with codeine works. She also would like her lab results. Please advise.

## 2014-10-31 NOTE — Telephone Encounter (Signed)
It was sent to MyChart: Your blood sugar continues to be elevated despite the current regimen. Your A1c increased from 10.2 to 11. I am going to refer you to Endocrinology for assistance in lowering your blood sugar. You should be hearing back from them shortly. Your electrolytes and kidney function were both within the normal ranges. Please let me know if you have any questions.   I will send her Tylenol 3.

## 2014-11-03 ENCOUNTER — Encounter: Payer: Self-pay | Admitting: Sports Medicine

## 2014-11-03 ENCOUNTER — Ambulatory Visit (INDEPENDENT_AMBULATORY_CARE_PROVIDER_SITE_OTHER): Payer: Medicare Other | Admitting: Sports Medicine

## 2014-11-03 VITALS — BP 144/84 | Ht 63.0 in | Wt 154.0 lb

## 2014-11-03 DIAGNOSIS — M25562 Pain in left knee: Secondary | ICD-10-CM | POA: Diagnosis not present

## 2014-11-03 MED ORDER — KETOROLAC TROMETHAMINE 60 MG/2ML IM SOLN
60.0000 mg | Freq: Once | INTRAMUSCULAR | Status: AC
  Administered 2014-11-03: 60 mg via INTRA_ARTICULAR

## 2014-11-03 MED ORDER — METHYLPREDNISOLONE ACETATE 40 MG/ML IJ SUSP
20.0000 mg | Freq: Once | INTRAMUSCULAR | Status: AC
Start: 2014-11-03 — End: 2014-11-03
  Administered 2014-11-03: 20 mg via INTRA_ARTICULAR

## 2014-11-03 MED ORDER — KETOROLAC TROMETHAMINE 60 MG/2ML IJ SOLN: 60.0000 mg | Freq: Once | INTRAMUSCULAR | Status: DC

## 2014-11-03 NOTE — Progress Notes (Signed)
  Tiffany [name] - 64 y.o. female MRN [id number]  Date of birth: 12-20-1950  SUBJECTIVE: Including CC, HPI, ROS Chief complaint: Left knee pain HPI   long-standing knee pain that is diffuse.  Worse with prolonged sitting or with prolonged standing.  Denies any significant swelling or effusion.  History of poorly controlled diabetes and is not been taking anti-inflammatories on a regular basis due to concerns with taking too many meds, does report some improvement with NSAIDs.  She responded well to injection therapy in the past.  Denies any catching, locking, buckling or giving way.    ROS per history of present illness  OBJECTIVE: BP 144/84 mmHg  Ht 5\' 3"  (1.6 m)  Wt 154 lb (69.854 kg)  BMI 27.29 kg/m2  Physical Exam   Procedure left the injection 60 Toradol, 20 of Depo-Medrol plan injection as above trial low-dose steroid to try to help decrease the systemic effect on her sugars.  Will see how she does with this and plan to see her back in 6 weeks to reevaluate.  Discussed she maybe a candidate for Visco supplementation and is can be reevaluated after follow-up visit.   Review of systems per HPI exam adult femaleabnormal female exam vascular lower exam neural lower exam left knee overall well aligned with minimal genu valgus.  She is medial and lateral joint line tenderness.  No significant effusion.  Positive patellar grind.  Stable to varus and valgus strain.  Pain with Lockman's pain with McMurray's.  Stable to Lockman's.  Pain with Thessaly.   DATA REVIEWED & OBTAINED:  No notes on file  HISTORY: History of anxiety, depression, chronic multilevel cervical and lumbar spine disease. No specialty comments available. Social History   Occupational History  . Disability    Social History Main Topics  . Smoking status: Former Smoker -- 0.50 packs/day for 15 years    Quit date: 08/02/1990  . Smokeless tobacco: Never Used  . Alcohol Use: No  . Drug Use: No  . Sexual Activity: No     Problem  Knee Pain, Left      ASSESSMENT & PLAN: See Patient instructions for additional information   ICD-9-CM ICD-10-CM   1. Knee pain, left 719.46 M25.562 methylPREDNISolone acetate (DEPO-MEDROL) injection 20 mg     ketorolac (TORADOL) injection 60 mg     DISCONTINUED: ketorolac (TORADOL) injection 60 mg     PROCEDURE NOTE : Left Knee Injection After discussing the risks, benefits and expected outcomes of the injection and all questions were reviewed and answered,  she wished to undergo the above named procedure.  Written consent was obtained. After an appropriate time out was taken the target structure prepped and injected as below: Prep:  Betadine and alcohol,  Approach:  anteriolateral  Anesthes  Ethel chloride,   Needle:  22g 1.5 inch  Aspirate:  n/a  Meds:  2:1:0.5 : Lidocaine, 60mg  Toradol, 40mg  depomedrol  Dressing:  Bandaid  This procedure was well tolerated and there were no complications.      FOLLOW UP:  No Follow-up on file.

## 2014-11-10 DIAGNOSIS — M25562 Pain in left knee: Secondary | ICD-10-CM | POA: Insufficient documentation

## 2014-11-10 NOTE — Assessment & Plan Note (Signed)
Injection as above with low-dose steroid to try to help decrease the systemic effect on her sugars.  Will see how she does with this and plan to see her back in 6 weeks to reevaluate.  Discussed she maybe a candidate for Visco supplementation and is can be reevaluated after follow-up visit.  Quad sets and SLR reviewed

## 2014-11-18 ENCOUNTER — Other Ambulatory Visit: Payer: Self-pay

## 2014-11-18 DIAGNOSIS — E1165 Type 2 diabetes mellitus with hyperglycemia: Secondary | ICD-10-CM

## 2014-11-18 DIAGNOSIS — IMO0002 Reserved for concepts with insufficient information to code with codable children: Secondary | ICD-10-CM

## 2014-11-18 MED ORDER — INSULIN PEN NEEDLE 32G X 4 MM MISC: Status: DC

## 2014-11-21 ENCOUNTER — Other Ambulatory Visit: Payer: Self-pay

## 2015-01-03 ENCOUNTER — Other Ambulatory Visit: Payer: Self-pay | Admitting: Family

## 2015-02-07 ENCOUNTER — Telehealth: Payer: Self-pay

## 2015-02-07 ENCOUNTER — Ambulatory Visit (INDEPENDENT_AMBULATORY_CARE_PROVIDER_SITE_OTHER): Payer: Medicare Other

## 2015-02-07 ENCOUNTER — Telehealth: Payer: Self-pay | Admitting: Family

## 2015-02-07 ENCOUNTER — Other Ambulatory Visit (INDEPENDENT_AMBULATORY_CARE_PROVIDER_SITE_OTHER): Payer: Medicare Other

## 2015-02-07 VITALS — BP 116/72 | HR 84 | Ht 63.0 in | Wt 149.0 lb

## 2015-02-07 DIAGNOSIS — Z136 Encounter for screening for cardiovascular disorders: Secondary | ICD-10-CM

## 2015-02-07 DIAGNOSIS — Z23 Encounter for immunization: Secondary | ICD-10-CM | POA: Diagnosis not present

## 2015-02-07 DIAGNOSIS — Z0000 Encounter for general adult medical examination without abnormal findings: Secondary | ICD-10-CM | POA: Diagnosis not present

## 2015-02-07 DIAGNOSIS — E089 Diabetes mellitus due to underlying condition without complications: Secondary | ICD-10-CM

## 2015-02-07 DIAGNOSIS — IMO0001 Reserved for inherently not codable concepts without codable children: Secondary | ICD-10-CM

## 2015-02-07 DIAGNOSIS — E46 Unspecified protein-calorie malnutrition: Secondary | ICD-10-CM | POA: Diagnosis not present

## 2015-02-07 DIAGNOSIS — Z13220 Encounter for screening for lipoid disorders: Secondary | ICD-10-CM

## 2015-02-07 LAB — LIPID PANEL
CHOLESTEROL: 210 mg/dL — AB (ref 0–200)
HDL: 55.5 mg/dL (ref >39.00)
LDL CALC: 141 mg/dL — AB (ref 0–99)
NONHDL: 154.13
Total CHOL/HDL Ratio: 4
Triglycerides: 68 mg/dL (ref 0.0–149.0)
VLDL: 13.6 mg/dL (ref 0.0–40.0)

## 2015-02-07 LAB — HEMOGLOBIN A1C: HEMOGLOBIN A1C: 10.6 % — AB (ref 4.6–6.5)

## 2015-02-07 MED ORDER — ACETAMINOPHEN-CODEINE #3 300-30 MG PO TABS: 1.0000 | ORAL_TABLET | Freq: Three times a day (TID) | ORAL | Status: DC | PRN

## 2015-02-07 MED ORDER — CYCLOBENZAPRINE HCL 10 MG PO TABS: 10.0000 mg | ORAL_TABLET | Freq: Three times a day (TID) | ORAL | Status: DC | PRN

## 2015-02-07 NOTE — Telephone Encounter (Signed)
Call to Dr. Harley Hallmark office and spoke to medical records; stated this patient was a cancellation; asked if they are participating with [place] and was told she thinks so and stated the patient stated she did not come due to being told she owed a 200 or more oop; will have the patient fup with the insurer.

## 2015-02-07 NOTE — Telephone Encounter (Signed)
Call to [facility] on [address] and spoke with Barnabas Lister at the pharmacy who confirmed [place] dob 23-Oct-1950 rec'd shingles vaccine on 02/14/2014

## 2015-02-07 NOTE — Telephone Encounter (Signed)
Pt called in and said that she is coming in to see susan today and wanted to see if she could get a refill on her pain meds while she was here.

## 2015-02-07 NOTE — Telephone Encounter (Signed)
The patient in for AWV; States her pain is in lower back but also in upper neck area; Pain is left knee getting worse. 1 - 10 is an 8;   Could not fup with neuro referral due to oop cost; I will fup with insurer;  Pain meds need refill :  Tyleonol no 3; take q 8 hours as needed for severe pain Cyclobenzaprine; currently on 5 mg but stated 10mg  helped her; She did not take 2 of the 5mg  at hs because the pharmacy only filled 30 and she would be out in 15 days.  Would like these called into [facility] for these 2 meds;   Will make a fup apt in case needed for review of diabetes

## 2015-02-07 NOTE — Progress Notes (Addendum)
Subjective:   Tiffany [name] is a 64 y.o. female who presents for an Initial Medicare Annual Wellness Visit.  Review of Systems     HRA assessment completed during visit; Tiffany [name] is here for Annual Wellness Assessment The [name] was informed that this wellness visit is to identify risk and educate on how to reduce risk for increase disease through lifestyle changes.   ROS deferred to CPE exam with physician  Medical hx HTN; BP running 413 systolic range; was normal today DDD with referral fup; in the process of renovating her apt;  c/o of pain from neck down; States that she was told her oop to see referral neuro doctor was several hundred dollars and she could not afford this; Referred back to insurance carrier to be sure the neuro was in network. Will fup  Mostly low back; 1-10; 8 to 9; Priorities are the knee and the lower back; orthopedic referral and the neuro. [name] to call insurer regarding par ortho and will call the office if she needs a referral   Hyperlipidemia; Cho 230; Trig 94; HDL 53; LDL 158 DM2; last a1c 11 from 10.2 x 6 months ago; Glucose 294 Knees hurt and has been given cortisone injections; around May when A1c was drawn;  Thinks she had 2 other injections prior to May/ Agrees to take A1c today and has not had prednisone in 3 months; also agreed for lipid screen and states she got up late and had a "sip" of coffee today; prefers to have lipid drawn since she is here.    Depression was on lexapro but is not taking this; States she feels overwhelmed with conditions as she can't afford to fup. Will try to assist.  BMI: BMI 26.4 Diet; does not eat regular meals due to pain; tries to sleep and does not eat until 3pm; Salads; [place] fried chicken; sleeps a couple of hours; eat fish; backed chicken; fixes a meal every day;    Exercise; tries to walk but makes her knee worse; See Dr. in sports medicine Dr. Micheline Chapman for injections which only helped a couple of  weeks.  See surgeon but trying to decide who to see; will fup with insurance carrier  SAFETY one level home/ moving to lower level; bathroom accessibility; uses cane; smoke detectors New  bathroom; tub is too high at home; dtr has worked out plan for getting in and out safely.   Driving with no accidents and wears seatbelt  Stressors; Move and overall pain;   Medication review/Needs refills; will fup with Dr. Elna Breslow today  Mobilization and Functional losses in the last year. Not moving as well;   Counseling: HIV; educated and will defer to Dr. Elna Breslow  Colonoscopy; 07/02/2012; repeat in 10 years EKG 03/2014 Dexa scan; n/a due to age  Mammogram 08/2013 There are no findings suspicious for malignancy. Will have this year but wishes to postpone at present  [facility]   Hearing: right 2000; left 4000  Ophthalmology exam; March 2015/ [facility]UE; agreed to get this completed this year as well  Immunizations Due  Flu vaccine ; taken today Zoster; [name] stated took this at [facility] on [address]/ will fup for confirmation  Cardiac Risk Factors include: diabetes mellitus;sedentary lifestyle     Objective:    Today's Vitals   02/07/15 1347  BP: 116/72  Pulse: 84  Height: 5' 3" (1.6 m)  Weight: 149 lb (67.586 kg)  SpO2: 98%    Current  Medications (verified) Outpatient Encounter Prescriptions as of 02/07/2015  Medication Sig  . ACCU-CHEK FASTCLIX LANCETS MISC Use to check blood sugar as directed up to 3 times a day. diag code 250.02. Insulin dependent  . amLODipine (NORVASC) 5 MG tablet Take 1 tablet (5 mg total) by mouth daily.  . Blood Glucose Monitoring Suppl (ACCU-CHEK NANO SMARTVIEW) W/DEVICE KIT Use to check blood sugar as directed up to 3 times a day. diag code 250.02. Insulin dependent  . glimepiride (AMARYL) 4 MG tablet TAKE 1 TABLET (4 MG TOTAL) BY MOUTH DAILY BEFORE BREAKFAST.  Marland Kitchen Insulin Glargine (LANTUS SOLOSTAR) 100 UNIT/ML Solostar Pen Inject  30 Units into the skin at bedtime.  . Insulin Pen Needle 32G X 4 MM MISC Use to inject  lantus once daily  . quinapril (ACCUPRIL) 40 MG tablet Take 1 tablet (40 mg total) by mouth daily.  Marland Kitchen zolpidem (AMBIEN) 10 MG tablet TAKE 1 TABLET BY MOUTH EVERY DAY AT BEDTIME AS NEEDED  . [DISCONTINUED] acetaminophen-codeine (TYLENOL #3) 300-30 MG per tablet Take 1 tablet by mouth every 8 (eight) hours as needed for moderate pain or severe pain.  . [DISCONTINUED] cyclobenzaprine (FLEXERIL) 5 MG tablet TAKE 1-2 TABLET BY MOUTH AT BEDTIME AS NEEDED FOR MUSCLE SPASMS.  Marland Kitchen escitalopram (LEXAPRO) 10 MG tablet Take 1 tablet (10 mg total) by mouth daily.  Marland Kitchen glucosamine-chondroitin (MAX GLUCOSAMINE CHONDROITIN) 500-400 MG tablet Take 1 tablet by mouth 3 (three) times daily. ([name] not taking: Reported on 02/07/2015)  . glucose blood test strip Use as instructed  . lidocaine (LIDODERM) 5 % Place 1 patch onto the skin daily. Remove & Discard patch within 12 hours or as directed by MD ([name] not taking: Reported on 02/07/2015)  . methocarbamol (ROBAXIN) 500 MG tablet Take 500 mg by mouth every 6 (six) hours as needed for muscle spasms.   . naproxen (NAPROSYN) 500 MG tablet Take 1 tablet (500 mg total) by mouth 2 (two) times daily with a meal. ([name] not taking: Reported on 02/07/2015)  . oxyCODONE-acetaminophen (PERCOCET) 5-325 MG per tablet Take 1 tablet by mouth every 4 (four) hours as needed for severe pain. ([name] not taking: Reported on 02/07/2015)  . promethazine (PHENERGAN) 25 MG tablet Take 1 tablet (25 mg total) by mouth every 6 (six) hours as needed for nausea or vomiting.  . [DISCONTINUED] LANTUS SOLOSTAR 100 UNIT/ML Solostar Pen INJECT 30 UNITS SUBCUTANEOUSLY AT BEDTIME   No facility-administered encounter medications on file as of 02/07/2015.    Allergies (verified) Metformin and related; Aspirin; Contrast media; Shellfish allergy; and Tramadol   History: Past Medical History  Diagnosis Date  .  Diabetes mellitus   . Hypertension   . Hyperlipidemia   . Degenerative joint disease   . Sleep apnea    Past Surgical History  Procedure Laterality Date  . Lumbar laminectomy  1989  . Knee arthroscopy      left x 2  . Abdominal hysterectomy  1976   Family History  Problem Relation Age of Onset  . Colon cancer Neg Hx   . Colon polyps Neg Hx   . Rectal cancer Neg Hx   . Stomach cancer Neg Hx   . Hypertension Mother   . Diabetes Mother   . Thyroid disease Mother   . Heart disease Mother   . Kidney disease Mother   . Diabetes Sister   . Hypertension Brother   . Stroke Brother   . Heart disease Brother    Social History   Occupational  History  . Disability    Social History Main Topics  . Smoking status: Former Smoker -- 0.50 packs/day for 15 years    Quit date: 08/02/1990  . Smokeless tobacco: Never Used  . Alcohol Use: No  . Drug Use: No  . Sexual Activity: No    Tobacco Counseling Counseling given: Yes   Activities of Daily Living In your present state of health, do you have any difficulty performing the following activities: 02/07/2015  Hearing? N  Vision? N  Difficulty concentrating or making decisions? N  Walking or climbing stairs? Y  Dressing or bathing? N  Doing errands, shopping? N  Preparing Food and eating ? N  Using the Toilet? N  In the past six months, have you accidently leaked urine? N  Do you have problems with loss of bowel control? N  Managing your Medications? N  Managing your Finances? N  Housekeeping or managing your Housekeeping? N    Immunizations and Health Maintenance Immunization History  Administered Date(s) Administered  . Influenza Split 06/08/2012  . Influenza Whole 05/25/2007, 03/17/2008, 03/27/2009  . Influenza,inj,Quad PF,36+ Mos 02/07/2015  . Pneumococcal Polysaccharide-23 06/27/2004  . Td 09/26/2008   Health Maintenance Due  Topic Date Due  . HIV Screening  07[birth year]10/1966  . ZOSTAVAX  07/11/2011  . LIPID PANEL   01/04/2014  . FOOT EXAM  01/26/2014  . OPHTHALMOLOGY EXAM  08/24/2014  . INFLUENZA VACCINE  12/26/2014  . HEMOGLOBIN A1C  01/13/2015    [name] Care Team: Golden Circle, FNP as PCP - General (Family Medicine)  Indicate any recent Medical Services you may have received from other than [facility] providers in the past year (date may be approximate).     Assessment:   This is a routine wellness examination for [place].   Assessment   Today [name] counseled on age appropriate routine health concerns for screening and prevention, each reviewed and up to date or declined. Immunizations reviewed and took flu shot today. A1c lab with lipid ordered.  Risk factors for depression reviewed and negative. Hearing function with slight loss in right ear.  ADLs screened and addressed as needed. Functional ability and level of safety reviewed and appropriate. Just moved to 1st level; 2 bedroom apt; Educated on memory loss and was not tested today due to the [name] stated she had no problems or issues; Oriented x 3; good historian; Denies any failures at normal task or forgetfulness; no car accidents; Education, counseling and referrals performed based on assessed risks today. [name] provided with a copy of personalized plan for preventive services and due dates  FALL PREVENTION: Uses a cane on right to support left knee; tolerated assessment well; no falls and is careful at home;  Recommended  Vision checks; home modifications for safety as needed   HEPATITIS SCREEN reviewed for generally information; Noted HIV screen and the [name] did not feel she was at risk; Can discuss with at next fup visit.   TOBACCO limited /ETOH or other DRUG use was negative  Risk Calculator: [email or web address]  Reviewed risk for CVD; but pain and fup with referrals is a priority and then feels she can get BS back in control; Discussed irregular eating and diabetic counseling center if she can go; Also discussed  A1c and gave her information on current meds; and A1c management, as well as knowing her Lipid numbers;   EXERCISE / DIET RECOMMENDATIONS The [name] agrees to fup on medical apts to try and get pain managed  Barriers to successful  management: some financial issues getting strips but was using non par pharmacy; Explained the need to call call insurance for par diabetic supplies pharmacy.   Stress; secondary to pain; Also mother frail and concerned about placing her in snf at some point in the future    Hearing/Vision screen  Hearing Screening   125Hz 250Hz 500Hz 1000Hz 2000Hz 4000Hz 8000Hz  Right ear:     100    Left ear:      100   Comments: No issues noted    Dietary issues and exercise activities discussed:    Goals    . Blood Pressure < 140/90    . HEMOGLOBIN A1C < 7.0    . LDL CALC < 100    . [name] centered     Wants to get back to regular schedule; move more; less pain.       Depression Screen PHQ 2/9 Scores 02/07/2015 11/03/2014 01/10/2014 12/30/2013 08/23/2013 01/04/2013 07/13/2012  PHQ - 2 Score 0 0 0 0 0 0 0  Exception Documentation - Other- indicate reason in comment box - - - - -    Fall Risk Fall Risk  02/07/2015 11/03/2014 01/10/2014 12/30/2013 08/23/2013  Falls in the past year? No No No Yes No  Number falls in past yr: - - - 2 or more -  Risk for fall due to : Impaired balance/gait Other (Comment) - History of fall(s);Impaired balance/gait -    Cognitive Function: No flowsheet data found.  Screening Tests Health Maintenance  Topic Date Due  . HIV Screening  07/10/1965  . ZOSTAVAX  07/11/2011  . LIPID PANEL  01/04/2014  . FOOT EXAM  01/26/2014  . OPHTHALMOLOGY EXAM  08/24/2014  . INFLUENZA VACCINE  12/26/2014  . HEMOGLOBIN A1C  01/13/2015  . PNEUMOCOCCAL POLYSACCHARIDE VACCINE (2) 07/10/2016 (Originally 06/27/2009)  . MAMMOGRAM  09/18/2015  . TETANUS/TDAP  09/27/2018  . COLONOSCOPY  07/22/2022  . Hepatitis C Screening  Completed      Plan:     Rec'd  flu shot today Educated further on DM and lipids and drew labs today  Coached regarding fup with customer service regarding  neuro referral to determine cost of eval and other fees due as she was told by practice she would have to pay a 200.00 or more  Will fup with par ortho and will call the office if she needs a referral for her left knee; Taking steriod injections to knee several times this last year, but no injections after May.Last injection was in May  [facility] [address]; will call and confirm  Will fup with eye exam and mammogram this year;   (Fup post visit: Noted note regarding depression; the [name] stated back pain was 8 on scale of 1-10 and  that she felt overwhelmed because she was hurting and can't afford medical treatment; Pain med refilled today but will will outreach for assistance regarding referrals as well as to be sure she gets her medical supplies to check her diabetes; the [name] agreed to fup with Terri Piedra for further review of medications as appropriate as well as A1c and lipids results)   During the course of the visit, [place] was educated and counseled about the following appropriate screening and preventive services:   Vaccines to include Pneumoccal, Influenza, Hepatitis B, Td, Zostavax, HCV/ had zoster and took flu shot today  Electrocardiogram 03/2014  Cardiovascular disease screening/ lipid labs drawn  Colorectal cancer screening/ due 2024  Bone density screening/  deferred to 22  Diabetes screening/ discussed consequences of 11 A1c and given material on actual bs levels over 250; The [name] states she understands consequences;   Glaucoma screening/needs eye exam   Mammography/PAP Deferred for now; [facility]  Nutrition counseling; offered support to note that regular eating habits need to be re-instituted but all seems to be dependent on pain control;  Smoking cessation counseling/ minimal smoking in the  past [name] Instructions (the written plan) were given to the [name].    Wynetta Fines, RN   02/07/2015    Medical screening examination/treatment/procedure(s) were performed by non-physician practitioner and as supervising provider I was immediately available for consultation/collaboration. I agree with above. Mauricio Po, FNP

## 2015-02-07 NOTE — Telephone Encounter (Signed)
Call to the patient regarding her AWV; Requested she come in around 1:15 as well as bring in her meds that she takes by mouth.  States she is in pain this am but still plans to come in this afternoon.

## 2015-02-07 NOTE — Telephone Encounter (Signed)
Call to the patient and she was out to dinner; did go pick up her rx;  Shared that her shingles vacinnation has  been updated; Would like for her to fup with insurer tomorrow to better understand the financial burden for this referral. The patient will call in the am and review with me the letter she rec'd from Neuro; Also addressed her mood as today, her affect was appropriate and stated she was not depressed, just hurting and frustrated at the lack of resources; The patient agreed that she was more "tired of the pain" and would like to fup for knee or back to feel better, which would reduce her total pain making her more mobile;   Will call in the morning and assist with development of a plan as well as schedule fup with Terri Piedra to review meds.  Will also fup regarding her inability to get test strips;  The patient agreed to the plan

## 2015-02-07 NOTE — Patient Instructions (Addendum)
Tiffany [name] , Thank you for taking time to come for your Medicare Wellness Visit. I appreciate your ongoing commitment to your health goals. Please review the following plan we discussed and let me know if I can assist you in the future.   Rec'd the flu shot today Will have mammogram later this year  To call insurance for the following: 1; to find out which orthopedic if participating;  2. To discuss neuro referral Dr. Joya Salm and cost of this referral 3. To fup on which diabetic supplier to use for test strips  To request refills at this admission  for flexaril and tylenol no 3   These are the goals we discussed: Goals    . Blood Pressure < 140/90    . HEMOGLOBIN A1C < 7.0    . LDL CALC < 100    . patient centered     Wants to get back to regular schedule; move more; less pain.        This is a list of the screening recommended for you and due dates:  Health Maintenance  Topic Date Due  . HIV Screening  07/10/1966  . Shingles Vaccine  07/11/2011  . Lipid (cholesterol) test  01/04/2014  . Complete foot exam   01/26/2014  . Eye exam for diabetics  08/24/2014  . Flu Shot  12/26/2014  . Hemoglobin A1C  01/13/2015  . Pneumococcal vaccine (2) 07/10/2016*  . Mammogram  09/18/2015  . Tetanus Vaccine  09/27/2018  . Colon Cancer Screening  07/22/2022  .  Hepatitis C: One time screening is recommended by Center for Disease Control  (CDC) for  adults born from [birth year] through 1964.   Completed  *Topic was postponed. The date shown is not the original due date.     Screening for Type 2 Diabetes Screening is a way to check for type 2 diabetes in people who do not have symptoms of the disease, but who may likely develop diabetes in the future. Diabetes can lead to serious health problems, but finding diabetes early allows for early treatment. DIABETES RISK FACTORS   Family history of diabetes.  Diseases of the pancreas.  Obesity or being overweight.  Certain racial or ethnic  groups:  American [place].  Pacific Islander.  Hispanic.  Asian.  African American.  High blood pressure (hypertension).  History of diabetes while pregnant (gestational diabetes).  Delivering a baby that weighed over 9 pounds.  Being inactive.  High cholesterol or triglycerides.  Age, especially over 75 years of age. WHO IS SCREENED Adults  Adults who have no risk factors and no symptoms should be screened starting at age 32. If the screening tests are normal, they should be repeated every 3 years.  Adults who do not have symptoms, but are overweight, should be screened before age 42.  Adults who do not have symptoms, but have 1 or more risk factors, should be screened.  Adults who have an A1c (3 month average of blood glucose) greater than 5.7% or who had an impaired glucose tolerance (IGT) or impaired fasting glucose (IFG) on a previous test should be screened.  Pregnant women with or without risk factors should be screened.  Women who gave birth and had gestational diabetes should be screened. This testing should be done 6 to 12 weeks after the child is born. Children or Adolescents  Children and adolescents should be screened for type 2 diabetes if they are overweight and have 2 of the following risk factors:  Having  a family history of type 2 diabetes.  Being a member of a high risk race or ethnic group.  Having signs of insulin resistance or conditions associated with insulin resistance.  Having a mother who had gestational diabetes while pregnant with him or her.  Screening should start at age 17 or at the onset of puberty, whichever comes first. This should be repeated every 2 years. SCREENING In a screening, your caregiver may:  Ask questions about your overall health. This will include questions about the health of close family members, too.  Ask about any diabetes-like symptoms you may have.  Perform a physical exam.  Order some tests that may  include:  A fasting plasma glucose test. This measures the level of glucose in your blood. It is done after you have had nothing to eat but water (fasted) for 8 hours.  A random blood glucose test. This test is done without the need to fast.  An oral glucose tolerance test. This is a blood test done in 2 parts. First, a blood sample is taken after you have fasted. Then, another sample is taken after you drink a liquid that contains a lot of sugar.  An A1c test. This test shows how much glucose has been in your blood over the past 2 to 3 months. Document Released: 03/09/2009 Document Revised: 08/05/2011 Document Reviewed: 12/19/2010 [facility] Patient Information 2015 [place], [place]. This information is not intended to replace advice given to you by your health care provider. Make sure you discuss any questions you have with your health care provider.  Health Maintenance Adopting a healthy lifestyle and getting preventive care can go a long way to promote health and wellness. Talk with your health care provider about what schedule of regular examinations is right for you. This is a good chance for you to check in with your provider about disease prevention and staying healthy. In between checkups, there are plenty of things you can do on your own. Experts have done a lot of research about which lifestyle changes and preventive measures are most likely to keep you healthy. Ask your health care provider for more information. WEIGHT AND DIET  Eat a healthy diet  Be sure to include plenty of vegetables, fruits, low-fat dairy products, and lean protein.  Do not eat a lot of foods high in solid fats, added sugars, or salt.  Get regular exercise. This is one of the most important things you can do for your health.  Most adults should exercise for at least 150 minutes each week. The exercise should increase your heart rate and make you sweat (moderate-intensity exercise).  Most adults should also do  strengthening exercises at least twice a week. This is in addition to the moderate-intensity exercise.  Maintain a healthy weight  Body mass index (BMI) is a measurement that can be used to identify possible weight problems. It estimates body fat based on height and weight. Your health care provider can help determine your BMI and help you achieve or maintain a healthy weight.  For females 22 years of age and older:   A BMI below 18.5 is considered underweight.  A BMI of 18.5 to 24.9 is normal.  A BMI of 25 to 29.9 is considered overweight.  A BMI of 30 and above is considered obese.  Watch levels of cholesterol and blood lipids  You should start having your blood tested for lipids and cholesterol at 63 years of age, then have this test every 5 years.  You may need to have your cholesterol levels checked more often if:  Your lipid or cholesterol levels are high.  You are older than 63 years of age.  You are at high risk for heart disease.  CANCER SCREENING   Lung Cancer  Lung cancer screening is recommended for adults 20-67 years old who are at high risk for lung cancer because of a history of smoking.  A yearly low-dose CT scan of the lungs is recommended for people who:  Currently smoke.  Have quit within the past 15 years.  Have at least a 30-pack-year history of smoking. A pack year is smoking an average of one pack of cigarettes a day for 1 year.  Yearly screening should continue until it has been 15 years since you quit.  Yearly screening should stop if you develop a health problem that would prevent you from having lung cancer treatment.  Breast Cancer  Practice breast self-awareness. This means understanding how your breasts normally appear and feel.  It also means doing regular breast self-exams. Let your health care provider know about any changes, no matter how small.  If you are in your 20s or 30s, you should have a clinical breast exam (CBE) by a  health care provider every 1-3 years as part of a regular health exam.  If you are 79 or older, have a CBE every year. Also consider having a breast X-ray (mammogram) every year.  If you have a family history of breast cancer, talk to your health care provider about genetic screening.  If you are at high risk for breast cancer, talk to your health care provider about having an MRI and a mammogram every year.  Breast cancer gene (BRCA) assessment is recommended for women who have family members with BRCA-related cancers. BRCA-related cancers include:  Breast.  Ovarian.  Tubal.  Peritoneal cancers.  Results of the assessment will determine the need for genetic counseling and BRCA1 and BRCA2 testing. Cervical Cancer Routine pelvic examinations to screen for cervical cancer are no longer recommended for nonpregnant women who are considered low risk for cancer of the pelvic organs (ovaries, uterus, and vagina) and who do not have symptoms. A pelvic examination may be necessary if you have symptoms including those associated with pelvic infections. Ask your health care provider if a screening pelvic exam is right for you.   The Pap test is the screening test for cervical cancer for women who are considered at risk.  If you had a hysterectomy for a problem that was not cancer or a condition that could lead to cancer, then you no longer need Pap tests.  If you are older than 65 years, and you have had normal Pap tests for the past 10 years, you no longer need to have Pap tests.  If you have had past treatment for cervical cancer or a condition that could lead to cancer, you need Pap tests and screening for cancer for at least 20 years after your treatment.  If you no longer get a Pap test, assess your risk factors if they change (such as having a new sexual partner). This can affect whether you should start being screened again.  Some women have medical problems that increase their chance of  getting cervical cancer. If this is the case for you, your health care provider may recommend more frequent screening and Pap tests.  The human papillomavirus (HPV) test is another test that may be used for cervical cancer screening. The HPV  test looks for the virus that can cause cell changes in the cervix. The cells collected during the Pap test can be tested for HPV.  The HPV test can be used to screen women 70 years of age and older. Getting tested for HPV can extend the interval between normal Pap tests from three to five years.  An HPV test also should be used to screen women of any age who have unclear Pap test results.  After 63 years of age, women should have HPV testing as often as Pap tests.  Colorectal Cancer  This type of cancer can be detected and often prevented.  Routine colorectal cancer screening usually begins at 63 years of age and continues through 63 years of age.  Your health care provider may recommend screening at an earlier age if you have risk factors for colon cancer.  Your health care provider may also recommend using home test kits to check for hidden blood in the stool.  A small camera at the end of a tube can be used to examine your colon directly (sigmoidoscopy or colonoscopy). This is done to check for the earliest forms of colorectal cancer.  Routine screening usually begins at age 69.  Direct examination of the colon should be repeated every 5-10 years through 63 years of age. However, you may need to be screened more often if early forms of precancerous polyps or small growths are found. Skin Cancer  Check your skin from head to toe regularly.  Tell your health care provider about any new moles or changes in moles, especially if there is a change in a mole's shape or color.  Also tell your health care provider if you have a mole that is larger than the size of a pencil eraser.  Always use sunscreen. Apply sunscreen liberally and repeatedly  throughout the day.  Protect yourself by wearing long sleeves, pants, a wide-brimmed hat, and sunglasses whenever you are outside. HEART DISEASE, DIABETES, AND HIGH BLOOD PRESSURE   Have your blood pressure checked at least every 1-2 years. High blood pressure causes heart disease and increases the risk of stroke.  If you are between 58 years and 53 years old, ask your health care provider if you should take aspirin to prevent strokes.  Have regular diabetes screenings. This involves taking a blood sample to check your fasting blood sugar level.  If you are at a normal weight and have a low risk for diabetes, have this test once every three years after 63 years of age.  If you are overweight and have a high risk for diabetes, consider being tested at a younger age or more often. PREVENTING INFECTION  Hepatitis B  If you have a higher risk for hepatitis B, you should be screened for this virus. You are considered at high risk for hepatitis B if:  You were born in a country where hepatitis B is common. Ask your health care provider which countries are considered high risk.  Your parents were born in a high-risk country, and you have not been immunized against hepatitis B (hepatitis B vaccine).  You have HIV or AIDS.  You use needles to inject street drugs.  You live with someone who has hepatitis B.  You have had sex with someone who has hepatitis B.  You get hemodialysis treatment.  You take certain medicines for conditions, including cancer, organ transplantation, and autoimmune conditions. Hepatitis C  Blood testing is recommended for:  Everyone born from [birth year] through 1964.  Anyone with known risk factors for hepatitis C. Sexually transmitted infections (STIs)  You should be screened for sexually transmitted infections (STIs) including gonorrhea and chlamydia if:  You are sexually active and are younger than 63 years of age.  You are older than 63 years of age and your  health care provider tells you that you are at risk for this type of infection.  Your sexual activity has changed since you were last screened and you are at an increased risk for chlamydia or gonorrhea. Ask your health care provider if you are at risk.  If you do not have HIV, but are at risk, it may be recommended that you take a prescription medicine daily to prevent HIV infection. This is called pre-exposure prophylaxis (PrEP). You are considered at risk if:  You are sexually active and do not regularly use condoms or know the HIV status of your partner(s).  You take drugs by injection.  You are sexually active with a partner who has HIV. Talk with your health care provider about whether you are at high risk of being infected with HIV. If you choose to begin PrEP, you should first be tested for HIV. You should then be tested every 3 months for as long as you are taking PrEP.  PREGNANCY   If you are premenopausal and you may become pregnant, ask your health care provider about preconception counseling.  If you may become pregnant, take 400 to 800 micrograms (mcg) of folic acid every day.  If you want to prevent pregnancy, talk to your health care provider about birth control (contraception). OSTEOPOROSIS AND MENOPAUSE   Osteoporosis is a disease in which the bones lose minerals and strength with aging. This can result in serious bone fractures. Your risk for osteoporosis can be identified using a bone density scan.  If you are 89 years of age or older, or if you are at risk for osteoporosis and fractures, ask your health care provider if you should be screened.  Ask your health care provider whether you should take a calcium or vitamin D supplement to lower your risk for osteoporosis.  Menopause may have certain physical symptoms and risks.  Hormone replacement therapy may reduce some of these symptoms and risks. Talk to your health care provider about whether hormone replacement  therapy is right for you.  HOME CARE INSTRUCTIONS   Schedule regular health, dental, and eye exams.  Stay current with your immunizations.   Do not use any tobacco products including cigarettes, chewing tobacco, or electronic cigarettes.  If you are pregnant, do not drink alcohol.  If you are breastfeeding, limit how much and how often you drink alcohol.  Limit alcohol intake to no more than 1 drink per day for nonpregnant women. One drink equals 12 ounces of beer, 5 ounces of wine, or 1 ounces of hard liquor.  Do not use street drugs.  Do not share needles.  Ask your health care provider for help if you need support or information about quitting drugs.  Tell your health care provider if you often feel depressed.  Tell your health care provider if you have ever been abused or do not feel safe at home. Document Released: 11/26/2010 Document Revised: 09/27/2013 Document Reviewed: 04/14/2013 [facility] Patient Information 2015 [place], [place]. This information is not intended to replace advice given to you by your health care provider. Make sure you discuss any questions you have with your health care provider.

## 2015-02-07 NOTE — Telephone Encounter (Signed)
Medications refilled

## 2015-02-07 NOTE — Telephone Encounter (Signed)
Called pt to verify what medication she wanted refilled.

## 2015-02-07 NOTE — Telephone Encounter (Signed)
Medication has been filled 

## 2015-02-07 NOTE — Telephone Encounter (Signed)
    acetaminophen-codeine (TYLENOL #3) 300-30 MG per tablet [[id number]     This is what she wanted refilled

## 2015-02-08 NOTE — Telephone Encounter (Signed)
Outreach x 2 today; at 10:00 am and again this pm to make apt for fup with Terri Piedra, as well as review for issues and did leave a message this am;

## 2015-02-13 ENCOUNTER — Other Ambulatory Visit: Payer: Self-pay | Admitting: Family

## 2015-02-20 ENCOUNTER — Other Ambulatory Visit: Payer: Self-pay | Admitting: Family

## 2015-02-20 NOTE — Telephone Encounter (Signed)
Call to the patient and LVM for her to call back to fup on her original referrals.

## 2015-03-30 ENCOUNTER — Other Ambulatory Visit: Payer: Self-pay | Admitting: Family

## 2015-03-30 ENCOUNTER — Other Ambulatory Visit: Payer: Self-pay

## 2015-03-30 MED ORDER — GLIMEPIRIDE 4 MG PO TABS: ORAL_TABLET | ORAL | Status: DC

## 2015-04-03 NOTE — Telephone Encounter (Signed)
Last refill 8/10

## 2015-05-08 ENCOUNTER — Encounter: Payer: Self-pay | Admitting: Family

## 2015-05-08 ENCOUNTER — Other Ambulatory Visit (INDEPENDENT_AMBULATORY_CARE_PROVIDER_SITE_OTHER): Payer: Medicare Other

## 2015-05-08 ENCOUNTER — Ambulatory Visit (INDEPENDENT_AMBULATORY_CARE_PROVIDER_SITE_OTHER): Payer: Self-pay | Admitting: Family

## 2015-05-08 VITALS — BP 130/78 | HR 96 | Temp 98.1°F | Resp 18 | Ht 63.0 in | Wt 155.8 lb

## 2015-05-08 DIAGNOSIS — IMO0001 Reserved for inherently not codable concepts without codable children: Secondary | ICD-10-CM

## 2015-05-08 DIAGNOSIS — Z794 Long term (current) use of insulin: Secondary | ICD-10-CM | POA: Diagnosis not present

## 2015-05-08 DIAGNOSIS — E1165 Type 2 diabetes mellitus with hyperglycemia: Secondary | ICD-10-CM

## 2015-05-08 DIAGNOSIS — M539 Dorsopathy, unspecified: Secondary | ICD-10-CM

## 2015-05-08 DIAGNOSIS — M25562 Pain in left knee: Secondary | ICD-10-CM

## 2015-05-08 LAB — HEMOGLOBIN A1C: Hgb A1c MFr Bld: 9.1 % — ABNORMAL HIGH (ref 4.6–6.5)

## 2015-05-08 MED ORDER — METHOCARBAMOL 750 MG PO TABS: 750.0000 mg | ORAL_TABLET | Freq: Four times a day (QID) | ORAL | Status: DC | PRN

## 2015-05-08 MED ORDER — OXYCODONE-ACETAMINOPHEN 5-325 MG PO TABS
1.0000 | ORAL_TABLET | Freq: Three times a day (TID) | ORAL | Status: DC | PRN
Start: 2015-05-08 — End: 2015-10-16

## 2015-05-08 MED ORDER — GLUCOSE BLOOD VI STRP
ORAL_STRIP | Status: DC
Start: 2015-05-08 — End: 2016-03-19

## 2015-05-08 NOTE — Progress Notes (Signed)
Subjective:    Patient ID: Tiffany [name], female    DOB: Oct 23, 1950, 64 y.o.   MRN: [id number]  Chief Complaint  Patient presents with  . Back Pain    has really bad back pain, midway down her back, hard to sit and stand, tyenol 3 is not touching the pain    HPI:  Tiffany [name] is a 64 y.o. female who  has a past medical history of Diabetes mellitus; Hypertension; Hyperlipidemia; Degenerative joint disease; and Sleep apnea. and presents today for an acute office visit.   1.) Low back pain - Continues to experience the assocaited symptom of chronic back pain that is at a severity level that makes it difficult to sit for long periods of time, lying or moving. The pain is described as sharp and notes it feels like a pulsating knife is being stuck in her back. Movement in any way makes it worse. Severity of the pain is currently about an 8/10. Modifying factors include warm compresses, ice, heating pads, Flexeril and Tylenol #3, of which none are helping with her pain.  Describes that both legs feel numb at times. Denies changes to bowel/bladder habits or saddle anesthesia. Has been referred to neurosurgery during previous office visit which was never completed.   2.) Knee pain - continues to experience associated symptom of pain located in her left knee that has been going on for several months and has been refractory to cortisone injections. She was a questionable candidate for Visco supplementation injections. Requesting referral to orthopedics.  Allergies  Allergen Reactions  . Metformin And Related     Severe nausea and vomiting  . Aspirin Nausea And Vomiting    Can take EC asa  . Contrast Media [Iodinated Diagnostic Agents] Nausea And Vomiting  . Shellfish Allergy Nausea And Vomiting  . Tramadol Nausea And Vomiting    Migraine     Current Outpatient Prescriptions on File Prior to Visit  Medication Sig Dispense Refill  . ACCU-CHEK FASTCLIX LANCETS MISC Use to check blood sugar  as directed up to 3 times a day. diag code 250.02. Insulin dependent 102 each 6  . acetaminophen-codeine (TYLENOL #3) 300-30 MG per tablet Take 1 tablet by mouth every 8 (eight) hours as needed for moderate pain or severe pain. 90 tablet 0  . amLODipine (NORVASC) 5 MG tablet Take 1 tablet (5 mg total) by mouth daily. 90 tablet 3  . Blood Glucose Monitoring Suppl (ACCU-CHEK NANO SMARTVIEW) W/DEVICE KIT Use to check blood sugar as directed up to 3 times a day. diag code 250.02. Insulin dependent 1 kit 0  . glimepiride (AMARYL) 4 MG tablet TAKE 1 TABLET (4 MG TOTAL) BY MOUTH DAILY BEFORE BREAKFAST. 30 tablet 2  . glucosamine-chondroitin (MAX GLUCOSAMINE CHONDROITIN) 500-400 MG tablet Take 1 tablet by mouth 3 (three) times daily. 90 tablet 3  . Insulin Glargine (LANTUS SOLOSTAR) 100 UNIT/ML Solostar Pen Inject 30 Units into the skin at bedtime.    . Insulin Pen Needle 32G X 4 MM MISC Use to inject  lantus once daily 100 each 4  . LANTUS SOLOSTAR 100 UNIT/ML Solostar Pen INJECT 30 UNITS SUBCUTANEOUSLY AT BEDTIME 3 mL 5  . lidocaine (LIDODERM) 5 % Place 1 patch onto the skin daily. Remove & Discard patch within 12 hours or as directed by MD 30 patch 0  . naproxen (NAPROSYN) 500 MG tablet Take 1 tablet (500 mg total) by mouth 2 (two) times daily with a meal. 60 tablet 0  .  promethazine (PHENERGAN) 25 MG tablet Take 1 tablet (25 mg total) by mouth every 6 (six) hours as needed for nausea or vomiting. 30 tablet 0  . quinapril (ACCUPRIL) 40 MG tablet Take 1 tablet (40 mg total) by mouth daily. 90 tablet 3  . zolpidem (AMBIEN) 10 MG tablet TAKE 1 TABLET BY MOUTH AT BEDTIME AS NEEDED 30 tablet 0  . escitalopram (LEXAPRO) 10 MG tablet Take 1 tablet (10 mg total) by mouth daily. 30 tablet 1   No current facility-administered medications on file prior to visit.    Past Medical History  Diagnosis Date  . Diabetes mellitus   . Hypertension   . Hyperlipidemia   . Degenerative joint disease   . Sleep apnea       Review of Systems  Musculoskeletal: Positive for back pain.  Neurological: Positive for numbness. Negative for weakness.      Objective:    BP 130/78 mmHg  Pulse 96  Temp(Src) 98.1 F (36.7 C) (Oral)  Resp 18  Ht _0  (1.6 m)  Wt 155 lb 12.8 oz (70.67 kg)  BMI 27.61 kg/m2  SpO2 99% Nursing note and vital signs reviewed.  Physical Exam  Constitutional: She is oriented to person, place, and time. She appears well-developed and well-nourished. No distress.  Cardiovascular: Normal rate, regular rhythm, normal heart sounds and intact distal pulses.   Pulmonary/Chest: Effort normal and breath sounds normal.  Musculoskeletal:  Low back - no obvious deformity, discoloration, or edema noted palpable tenderness along lumbar spine and sacrum as well as paraspinal musculature. Range of motion is severely limited in all directions secondary to pain and discomfort. Straight leg raise is positive. Distal pulses, reflexes, and sensation is intact and appropriate with no numbness.  Left knee - mild effusion with no obvious deformity or discoloration. Tenderness along bilateral joint lines. Range of motion is limited in flexion secondary to pain. Negative Lachman's, negative valgus varus, questionably positive meniscal pathology through McMurray's.   Neurological: She is alert and oriented to person, place, and time.  Skin: Skin is warm and dry.  Psychiatric: She has a normal mood and affect. Her behavior is normal. Judgment and thought content normal.       Assessment & Plan:   Problem List Items Addressed This Visit      Musculoskeletal and Integument   Multilevel degenerative disc disease of cervical and lumbar spine  - Primary    Continues to experience associated symptom of low back pain with minimal relief from Flexeril and Tylenol 3. Question compliance with exercise therapy. Refer to physical therapy for formal treatment. Has not completed previously requested neurosurgery  appointment. Will rerefer as patient is interested in pursuing at this time. Increase methocarbamol to 750 mg 3 times a day. Discontinue Flexeril. Continue home exercise therapy and stretching pending physical therapy and referral to neurosurgery. Start Percocet. Discussed importance of maintaining bowel habits and to treat minor constipation with over-the-counter medications as needed.      Relevant Medications   oxyCODONE-acetaminophen (PERCOCET) 5-325 MG tablet   methocarbamol (ROBAXIN) 750 MG tablet   Other Relevant Orders   Ambulatory referral to Physical Therapy   Ambulatory referral to Neurosurgery     Other   Diabetes mellitus type 2, uncontrolled, without complications (no retinopathy)   Relevant Medications   glucose blood (AGAMATRIX PRESTO TEST) test strip   Other Relevant Orders   Hemoglobin A1c (Completed)   Knee pain, left    Knee pain most likely osteoarthritic in nature,  however cannot rule out meniscal pathology. Previously treated with joint injections and questionable candidate for Visco elastic injections. Request referral to orthopedics. Referral placed. Continue conservative management through home exercise. Follow-up pending referral to orthopedics.      Relevant Orders   Ambulatory referral to Orthopedic Surgery

## 2015-05-08 NOTE — Assessment & Plan Note (Signed)
Knee pain most likely osteoarthritic in nature, however cannot rule out meniscal pathology. Previously treated with joint injections and questionable candidate for Visco elastic injections. Request referral to orthopedics. Referral placed. Continue conservative management through home exercise. Follow-up pending referral to orthopedics.

## 2015-05-08 NOTE — Assessment & Plan Note (Signed)
Continues to experience associated symptom of low back pain with minimal relief from Flexeril and Tylenol 3. Question compliance with exercise therapy. Refer to physical therapy for formal treatment. Has not completed previously requested neurosurgery appointment. Will rerefer as patient is interested in pursuing at this time. Increase methocarbamol to 750 mg 3 times a day. Discontinue Flexeril. Continue home exercise therapy and stretching pending physical therapy and referral to neurosurgery. Start Percocet. Discussed importance of maintaining bowel habits and to treat minor constipation with over-the-counter medications as needed.

## 2015-05-08 NOTE — Patient Instructions (Addendum)
Thank you for choosing [facility].  Summary/Instructions:  Your prescription(s) have been submitted to your pharmacy or been printed and provided for you. Please take as directed and contact our office if you believe you are having problem(s) with the medication(s) or have any questions.  Referrals have been made during this visit. You should expect to hear back from our schedulers in about 7-10 days in regards to establishing an appointment with the specialists we discussed.   If your symptoms worsen or fail to improve, please contact our office for further instruction, or in case of emergency go directly to the emergency room at the closest medical facility.   Low Back Sprain With Rehab A sprain is an injury in which a ligament is torn. The ligaments of the lower back are vulnerable to sprains. However, they are strong and require great force to be injured. These ligaments are important for stabilizing the spinal column. Sprains are classified into three categories. Grade 1 sprains cause pain, but the tendon is not lengthened. Grade 2 sprains include a lengthened ligament, due to the ligament being stretched or partially ruptured. With grade 2 sprains there is still function, although the function may be decreased. Grade 3 sprains involve a complete tear of the tendon or muscle, and function is usually impaired. SYMPTOMS   Severe pain in the lower back.  Sometimes, a feeling of a "pop," "snap," or tear, at the time of injury.  Tenderness and sometimes swelling at the injury site.  Uncommonly, bruising (contusion) within 48 hours of injury.  Muscle spasms in the back. CAUSES  Low back sprains occur when a force is placed on the ligaments that is greater than they can handle. Common causes of injury include:  Performing a stressful act while off-balance.  Repetitive stressful activities that involve movement of the lower back.  Direct hit (trauma) to the lower back. RISK  INCREASES WITH:  Contact sports (football, wrestling).  Collisions (major skiing accidents).  Sports that require throwing or lifting (baseball, weightlifting).  Sports involving twisting of the spine (gymnastics, diving, tennis, golf).  Poor strength and flexibility.  Inadequate protection.  Previous back injury or surgery (especially fusion). PREVENTION  Wear properly fitted and padded protective equipment.  Warm up and stretch properly before activity.  Allow for adequate recovery between workouts.  Maintain physical fitness:  Strength, flexibility, and endurance.  Cardiovascular fitness.  Maintain a healthy body weight. PROGNOSIS  If treated properly, low back sprains usually heal with non-surgical treatment. The length of time for healing depends on the severity of the injury.  RELATED COMPLICATIONS   Recurring symptoms, resulting in a chronic problem.  Chronic inflammation and pain in the low back.  Delayed healing or resolution of symptoms, especially if activity is resumed too soon.  Prolonged impairment.  Unstable or arthritic joints of the low back. TREATMENT  Treatment first involves the use of ice and medicine, to reduce pain and inflammation. The use of strengthening and stretching exercises may help reduce pain with activity. These exercises may be performed at home or with a therapist. Severe injuries may require referral to a therapist for further evaluation and treatment, such as ultrasound. Your caregiver may advise that you wear a back brace or corset, to help reduce pain and discomfort. Often, prolonged bed rest results in greater harm then benefit. Corticosteroid injections may be recommended. However, these should be reserved for the most serious cases. It is important to avoid using your back when lifting objects. At night,  sleep on your back on a firm mattress, with a pillow placed under your knees. If non-surgical treatment is unsuccessful, surgery  may be needed.  MEDICATION   If pain medicine is needed, nonsteroidal anti-inflammatory medicines (aspirin and ibuprofen), or other minor pain relievers (acetaminophen), are often advised.  Do not take pain medicine for 7 days before surgery.  Prescription pain relievers may be given, if your caregiver thinks they are needed. Use only as directed and only as much as you need.  Ointments applied to the skin may be helpful.  Corticosteroid injections may be given by your caregiver. These injections should be reserved for the most serious cases, because they may only be given a certain number of times. HEAT AND COLD  Cold treatment (icing) should be applied for 10 to 15 minutes every 2 to 3 hours for inflammation and pain, and immediately after activity that aggravates your symptoms. Use ice packs or an ice massage.  Heat treatment may be used before performing stretching and strengthening activities prescribed by your caregiver, physical therapist, or athletic trainer. Use a heat pack or a warm water soak. SEEK MEDICAL CARE IF:   Symptoms get worse or do not improve in 2 to 4 weeks, despite treatment.  You develop numbness or weakness in either leg.  You lose bowel or bladder function.  Any of the following occur after surgery: fever, increased pain, swelling, redness, drainage of fluids, or bleeding in the affected area.  New, unexplained symptoms develop. (Drugs used in treatment may produce side effects.) EXERCISES  RANGE OF MOTION (ROM) AND STRETCHING EXERCISES - Low Back Sprain Most people with lower back pain will find that their symptoms get worse with excessive bending forward (flexion) or arching at the lower back (extension). The exercises that will help resolve your symptoms will focus on the opposite motion.  Your physician, physical therapist or athletic trainer will help you determine which exercises will be most helpful to resolve your lower back pain. Do not complete any  exercises without first consulting with your caregiver. Discontinue any exercises which make your symptoms worse, until you speak to your caregiver. If you have pain, numbness or tingling which travels down into your buttocks, leg or foot, the goal of the therapy is for these symptoms to move closer to your back and eventually resolve. Sometimes, these leg symptoms will get better, but your lower back pain may worsen. This is often an indication of progress in your rehabilitation. Be very alert to any changes in your symptoms and the activities in which you participated in the 24 hours prior to the change. Sharing this information with your caregiver will allow him or her to most efficiently treat your condition. These exercises may help you when beginning to rehabilitate your injury. Your symptoms may resolve with or without further involvement from your physician, physical therapist or athletic trainer. While completing these exercises, remember:   Restoring tissue flexibility helps normal motion to return to the joints. This allows healthier, less painful movement and activity.  An effective stretch should be held for at least 30 seconds.  A stretch should never be painful. You should only feel a gentle lengthening or release in the stretched tissue. FLEXION RANGE OF MOTION AND STRETCHING EXERCISES: STRETCH - Flexion, Single Knee to Chest   Lie on a firm bed or floor with both legs extended in front of you.  Keeping one leg in contact with the floor, bring your opposite knee to your chest. Hold your  leg in place by either grabbing behind your thigh or at your knee.  Pull until you feel a gentle stretch in your low back. Hold __________ seconds.  Slowly release your grasp and repeat the exercise with the opposite side. Repeat __________ times. Complete this exercise __________ times per day.  STRETCH - Flexion, Double Knee to Chest  Lie on a firm bed or floor with both legs extended in front  of you.  Keeping one leg in contact with the floor, bring your opposite knee to your chest.  Tense your stomach muscles to support your back and then lift your other knee to your chest. Hold your legs in place by either grabbing behind your thighs or at your knees.  Pull both knees toward your chest until you feel a gentle stretch in your low back. Hold __________ seconds.  Tense your stomach muscles and slowly return one leg at a time to the floor. Repeat __________ times. Complete this exercise __________ times per day.  STRETCH - Low Trunk Rotation  Lie on a firm bed or floor. Keeping your legs in front of you, bend your knees so they are both pointed toward the ceiling and your feet are flat on the floor.  Extend your arms out to the side. This will stabilize your upper body by keeping your shoulders in contact with the floor.  Gently and slowly drop both knees together to one side until you feel a gentle stretch in your low back. Hold for __________ seconds.  Tense your stomach muscles to support your lower back as you bring your knees back to the starting position. Repeat the exercise to the other side. Repeat __________ times. Complete this exercise __________ times per day  EXTENSION RANGE OF MOTION AND FLEXIBILITY EXERCISES: STRETCH - Extension, Prone on Elbows   Lie on your stomach on the floor, a bed will be too soft. Place your palms about shoulder width apart and at the height of your head.  Place your elbows under your shoulders. If this is too painful, stack pillows under your chest.  Allow your body to relax so that your hips drop lower and make contact more completely with the floor.  Hold this position for __________ seconds.  Slowly return to lying flat on the floor. Repeat __________ times. Complete this exercise __________ times per day.  RANGE OF MOTION - Extension, Prone Press Ups  Lie on your stomach on the floor, a bed will be too soft. Place your palms  about shoulder width apart and at the height of your head.  Keeping your back as relaxed as possible, slowly straighten your elbows while keeping your hips on the floor. You may adjust the placement of your hands to maximize your comfort. As you gain motion, your hands will come more underneath your shoulders.  Hold this position __________ seconds.  Slowly return to lying flat on the floor. Repeat __________ times. Complete this exercise __________ times per day.  RANGE OF MOTION- Quadruped, Neutral Spine   Assume a hands and knees position on a firm surface. Keep your hands under your shoulders and your knees under your hips. You may place padding under your knees for comfort.  Drop your head and point your tailbone toward the ground below you. This will round out your lower back like an angry cat. Hold this position for __________ seconds.  Slowly lift your head and release your tail bone so that your back sags into a large arch, like an old  horse.  Hold this position for __________ seconds.  Repeat this until you feel limber in your low back.  Now, find your "sweet spot." This will be the most comfortable position somewhere between the two previous positions. This is your neutral spine. Once you have found this position, tense your stomach muscles to support your low back.  Hold this position for __________ seconds. Repeat __________ times. Complete this exercise __________ times per day.  STRENGTHENING EXERCISES - Low Back Sprain These exercises may help you when beginning to rehabilitate your injury. These exercises should be done near your "sweet spot." This is the neutral, low-back arch, somewhere between fully rounded and fully arched, that is your least painful position. When performed in this safe range of motion, these exercises can be used for people who have either a flexion or extension based injury. These exercises may resolve your symptoms with or without further involvement  from your physician, physical therapist or athletic trainer. While completing these exercises, remember:   Muscles can gain both the endurance and the strength needed for everyday activities through controlled exercises.  Complete these exercises as instructed by your physician, physical therapist or athletic trainer. Increase the resistance and repetitions only as guided.  You may experience muscle soreness or fatigue, but the pain or discomfort you are trying to eliminate should never worsen during these exercises. If this pain does worsen, stop and make certain you are following the directions exactly. If the pain is still present after adjustments, discontinue the exercise until you can discuss the trouble with your caregiver. STRENGTHENING - Deep Abdominals, Pelvic Tilt   Lie on a firm bed or floor. Keeping your legs in front of you, bend your knees so they are both pointed toward the ceiling and your feet are flat on the floor.  Tense your lower abdominal muscles to press your low back into the floor. This motion will rotate your pelvis so that your tail bone is scooping upwards rather than pointing at your feet or into the floor. With a gentle tension and even breathing, hold this position for __________ seconds. Repeat __________ times. Complete this exercise __________ times per day.  STRENGTHENING - Abdominals, Crunches   Lie on a firm bed or floor. Keeping your legs in front of you, bend your knees so they are both pointed toward the ceiling and your feet are flat on the floor. Cross your arms over your chest.  Slightly tip your chin down without bending your neck.  Tense your abdominals and slowly lift your trunk high enough to just clear your shoulder blades. Lifting higher can put excessive stress on the lower back and does not further strengthen your abdominal muscles.  Control your return to the starting position. Repeat __________ times. Complete this exercise __________ times  per day.  STRENGTHENING - Quadruped, Opposite UE/LE Lift   Assume a hands and knees position on a firm surface. Keep your hands under your shoulders and your knees under your hips. You may place padding under your knees for comfort.  Find your neutral spine and gently tense your abdominal muscles so that you can maintain this position. Your shoulders and hips should form a rectangle that is parallel with the floor and is not twisted.  Keeping your trunk steady, lift your right hand no higher than your shoulder and then your left leg no higher than your hip. Make sure you are not holding your breath. Hold this position for __________ seconds.  Continuing to keep your  abdominal muscles tense and your back steady, slowly return to your starting position. Repeat with the opposite arm and leg. Repeat __________ times. Complete this exercise __________ times per day.  STRENGTHENING - Abdominals and Quadriceps, Straight Leg Raise   Lie on a firm bed or floor with both legs extended in front of you.  Keeping one leg in contact with the floor, bend the other knee so that your foot can rest flat on the floor.  Find your neutral spine, and tense your abdominal muscles to maintain your spinal position throughout the exercise.  Slowly lift your straight leg off the floor about 6 inches for a count of 15, making sure to not hold your breath.  Still keeping your neutral spine, slowly lower your leg all the way to the floor. Repeat this exercise with each leg __________ times. Complete this exercise __________ times per day. POSTURE AND BODY MECHANICS CONSIDERATIONS - Low Back Sprain Keeping correct posture when sitting, standing or completing your activities will reduce the stress put on different body tissues, allowing injured tissues a chance to heal and limiting painful experiences. The following are general guidelines for improved posture. Your physician or physical therapist will provide you with any  instructions specific to your needs. While reading these guidelines, remember:  The exercises prescribed by your provider will help you have the flexibility and strength to maintain correct postures.  The correct posture provides the best environment for your joints to work. All of your joints have less wear and tear when properly supported by a spine with good posture. This means you will experience a healthier, less painful body.  Correct posture must be practiced with all of your activities, especially prolonged sitting and standing. Correct posture is as important when doing repetitive low-stress activities (typing) as it is when doing a single heavy-load activity (lifting). RESTING POSITIONS Consider which positions are most painful for you when choosing a resting position. If you have pain with flexion-based activities (sitting, bending, stooping, squatting), choose a position that allows you to rest in a less flexed posture. You would want to avoid curling into a fetal position on your side. If your pain worsens with extension-based activities (prolonged standing, working overhead), avoid resting in an extended position such as sleeping on your stomach. Most people will find more comfort when they rest with their spine in a more neutral position, neither too rounded nor too arched. Lying on a non-sagging bed on your side with a pillow between your knees, or on your back with a pillow under your knees will often provide some relief. Keep in mind, being in any one position for a prolonged period of time, no matter how correct your posture, can still lead to stiffness. PROPER SITTING POSTURE In order to minimize stress and discomfort on your spine, you must sit with correct posture. Sitting with good posture should be effortless for a healthy body. Returning to good posture is a gradual process. Many people can work toward this most comfortably by using various supports until they have the flexibility  and strength to maintain this posture on their own. When sitting with proper posture, your ears will fall over your shoulders and your shoulders will fall over your hips. You should use the back of the chair to support your upper back. Your lower back will be in a neutral position, just slightly arched. You may place a small pillow or folded towel at the base of your lower back for  support.  When  working at [employer], create an environment that supports good, upright posture. Without extra support, muscles tire, which leads to excessive strain on joints and other tissues. Keep these recommendations in mind: CHAIR:  A chair should be able to slide under your desk when your back makes contact with the back of the chair. This allows you to work closely.  The chair's height should allow your eyes to be level with the upper part of your monitor and your hands to be slightly lower than your elbows. BODY POSITION  Your feet should make contact with the floor. If this is not possible, use a foot rest.  Keep your ears over your shoulders. This will reduce stress on your neck and low back. INCORRECT SITTING POSTURES  If you are feeling tired and unable to assume a healthy sitting posture, do not slouch or slump. This puts excessive strain on your back tissues, causing more damage and pain. Healthier options include:  Using more support, like a lumbar pillow.  Switching tasks to something that requires you to be upright or walking.  Talking a brief walk.  Lying down to rest in a neutral-spine position. PROLONGED STANDING WHILE SLIGHTLY LEANING FORWARD  When completing a task that requires you to lean forward while standing in one place for a long time, place either foot up on a stationary 2-4 inch high object to help maintain the best posture. When both feet are on the ground, the lower back tends to lose its slight inward curve. If this curve flattens (or becomes too large), then the back and your other  joints will experience too much stress, tire more quickly, and can cause pain. CORRECT STANDING POSTURES Proper standing posture should be assumed with all daily activities, even if they only take a few moments, like when brushing your teeth. As in sitting, your ears should fall over your shoulders and your shoulders should fall over your hips. You should keep a slight tension in your abdominal muscles to brace your spine. Your tailbone should point down to the ground, not behind your body, resulting in an over-extended swayback posture.  INCORRECT STANDING POSTURES  Common incorrect standing postures include a forward head, locked knees and/or an excessive swayback. WALKING Walk with an upright posture. Your ears, shoulders and hips should all line-up. PROLONGED ACTIVITY IN A FLEXED POSITION When completing a task that requires you to bend forward at your waist or lean over a low surface, try to find a way to stabilize 3 out of 4 of your limbs. You can place a hand or elbow on your thigh or rest a knee on the surface you are reaching across. This will provide you more stability, so that your muscles do not tire as quickly. By keeping your knees relaxed, or slightly bent, you will also reduce stress across your lower back. CORRECT LIFTING TECHNIQUES DO :  Assume a wide stance. This will provide you more stability and the opportunity to get as close as possible to the object which you are lifting.  Tense your abdominals to brace your spine. Bend at the knees and hips. Keeping your back locked in a neutral-spine position, lift using your leg muscles. Lift with your legs, keeping your back straight.  Test the weight of unknown objects before attempting to lift them.  Try to keep your elbows locked down at your sides in order get the best strength from your shoulders when carrying an object.  Always ask for help when lifting heavy or  awkward objects. INCORRECT LIFTING TECHNIQUES DO NOT:   Lock  your knees when lifting, even if it is a small object.  Bend and twist. Pivot at your feet or move your feet when needing to change directions.  Assume that you can safely pick up even a paperclip without proper posture.   This information is not intended to replace advice given to you by your health care provider. Make sure you discuss any questions you have with your health care provider.   Document Released: 05/13/2005 Document Revised: 06/03/2014 Document Reviewed: 08/25/2008 [employer] Interactive Patient Education [employer].

## 2015-05-08 NOTE — Progress Notes (Signed)
Pre visit review using our clinic review tool, if applicable. No additional management support is needed unless otherwise documented below in the visit note. 

## 2015-05-09 ENCOUNTER — Encounter: Payer: Self-pay | Admitting: Family

## 2015-06-15 ENCOUNTER — Ambulatory Visit: Payer: Medicare Other | Attending: Orthopedic Surgery | Admitting: Physical Therapy

## 2015-06-15 ENCOUNTER — Encounter: Payer: Self-pay | Admitting: Physical Therapy

## 2015-06-15 DIAGNOSIS — R262 Difficulty in walking, not elsewhere classified: Secondary | ICD-10-CM | POA: Diagnosis present

## 2015-06-15 DIAGNOSIS — M65852 Other synovitis and tenosynovitis, left thigh: Secondary | ICD-10-CM | POA: Insufficient documentation

## 2015-06-15 DIAGNOSIS — M25562 Pain in left knee: Secondary | ICD-10-CM | POA: Insufficient documentation

## 2015-06-15 DIAGNOSIS — M25673 Stiffness of unspecified ankle, not elsewhere classified: Secondary | ICD-10-CM

## 2015-06-15 DIAGNOSIS — M76892 Other specified enthesopathies of left lower limb, excluding foot: Secondary | ICD-10-CM

## 2015-06-15 DIAGNOSIS — M5442 Lumbago with sciatica, left side: Secondary | ICD-10-CM | POA: Insufficient documentation

## 2015-06-15 DIAGNOSIS — R29898 Other symptoms and signs involving the musculoskeletal system: Secondary | ICD-10-CM | POA: Diagnosis present

## 2015-06-15 DIAGNOSIS — M5441 Lumbago with sciatica, right side: Secondary | ICD-10-CM | POA: Insufficient documentation

## 2015-06-15 DIAGNOSIS — M25662 Stiffness of left knee, not elsewhere classified: Secondary | ICD-10-CM

## 2015-06-15 NOTE — Therapy (Addendum)
[place], [place], [address] Phone: [phone]   Fax:  [phone]  Physical Therapy Evaluation  Patient Details  Name: Tiffany [name] MRN: [id number] Date of Birth: [birth year] Referring Provider: Netta Cedars MD  Encounter Date: 06/15/2015      PT End of Session - 06/15/15 1235    Visit Number 1   Number of Visits 16   Date for PT Re-Evaluation 08/10/15   Authorization Type [employer] medicare   Authorization - Visit Number 1   PT Start Time 1230   PT Stop Time 1320   PT Time Calculation (min) 50 min   Activity Tolerance Patient limited by pain   Behavior During Therapy [facility] for tasks assessed/performed      Past Medical History  Diagnosis Date  . Diabetes mellitus   . Hypertension   . Hyperlipidemia   . Degenerative joint disease   . Sleep apnea     Past Surgical History  Procedure Laterality Date  . Lumbar laminectomy  1989  . Knee arthroscopy      left x 2  . Abdominal hysterectomy  1976    There were no vitals filed for this visit.  Visit Diagnosis:  Bilateral low back pain with sciatica, sciatica laterality unspecified  Knee pain, left  Hamstring tendinitis of left thigh  Decreased ROM of ankle  Decreased ROM of left knee  Difficulty walking  Difficulty walking down stairs  Difficulty walking up stairs      Subjective Assessment - 06/15/15 1239    Subjective I have had knee pain when I climb steps and hurts at night but mostly when I am up during pain.  I have had this pain about a year or more.  I am scheduled for an MRI tomorrow for my back   Pertinent History knee surgery on left x 2 torn ligaments and cartilage 2004?   Limitations Standing;Walking;House hold activities;Sitting   How long can you sit comfortably? unlimited  sit to stand   How long can you stand comfortably? 15 min   How long can you walk comfortably? 15 min   Diagnostic tests due to have MRI on back    Patient Stated Goals get rid of knee pain. stand longer  to shopping/ no shopping now.   Currently in Pain? Yes   Pain Score 8    Pain Location Knee   Pain Orientation Left   Pain Descriptors / Indicators Aching;Stabbing;Tender;Sore;Sharp   Pain Type Chronic pain   Pain Onset More than a month ago   Pain Frequency Intermittent   Aggravating Factors  standing a long time, stairs and coming up and down   Pain Relieving Factors heat , ice and medicine   Multiple Pain Sites Yes   Pain Score 9   Pain Location Back  affected with knee evaluation.   Pain Orientation Lower   Pain Descriptors / Indicators Stabbing;Guarding   Pain Type Chronic pain   Pain Radiating Towards left and right side but pt had difficulty pin pointing   Pain Onset More than a month ago   Pain Frequency Constant            [facility] PT Assessment - 06/15/15 1235    Assessment   Medical Diagnosis Hamstring tendinitis, pes anserina bursitis/chondromalicia of left knee   Referring Provider Netta Cedars MD   Onset Date/Surgical Date 05/13/14   Hand Dominance Right   Prior Therapy none   Precautions   Precautions None  Restrictions   Weight Bearing Restrictions No   Balance Screen   Has the patient fallen in the past 6 months No   Has the patient had a decrease in activity level because of a fear of falling?  No   Home Environment   Living Environment Private residence   Living Arrangements Alone   Type of [place] Access Level entry   Prior Function   Level of Independence Independent   Vocation On disability  back    Cognition   Overall Cognitive Status Within Functional Limits for tasks assessed   Observation/Other Assessments   Focus on Therapeutic Outcomes (FOTO)  intake 38%, 62% limitation  predicted 47%   Posture/Postural Control   Posture/Postural Control Postural limitations   Postural Limitations Posterior pelvic tilt;Rounded Shoulders;Forward head;Flexed trunk;Weight shift  right   AROM   Overall AROM Comments unable to test hip due to pt increaseing back pain  and could not stretch hamstring in supine  Pt due to MRI of back tomorrow.   Right Knee Extension 0   Right Knee Flexion 131   Left Knee Extension 0   Left Knee Flexion 124   Right Ankle Dorsiflexion 6   Right Ankle Plantar Flexion 53   Left Ankle Dorsiflexion -4   Left Ankle Plantar Flexion 50   Strength   Right Knee Flexion 4+/5   Right Knee Extension 4+/5   Left Knee Flexion 3/5  limited by pain   Left Knee Extension 4-/5   Right Ankle Dorsiflexion 4+/5   Left Ankle Dorsiflexion 4-/5   Flexibility   Hamstrings Left 45/ Right 60    Palpation   Palpation comment  marked tenderness over left medial knee and left medial pes anserina insertion.  Pain in belly of medial hamsting    Ambulation/Gait   Ambulation/Gait Yes   Ambulation/Gait Assistance 7: Independent   Assistive device None   Gait Pattern Decreased stance time - left;Decreased step length - right;Antalgic   Ambulation Surface Level   Gait velocity 1.72 ft/sec   Gait Comments Pt has difficulty managing steps and has recently moved to level apartment                    [facility] Adult PT Treatment/Exercise - 06/15/15 1235    Knee/Hip Exercises: Stretches   Passive Hamstring Stretch 3 reps;30 seconds  sitting with left knee extended and hip hinge also with rota   Passive Hamstring Stretch Limitations Pt with rotation to stretch IT band in sitting as able with back pain   Other Knee/Hip Stretches sitting dorsiflexion of ankle self mob, towel stretch soleus and gastroc using towel  30 sec hold 3 times for 30 seconds   Manual Therapy   Kinesiotex Inhibit Muscle;Create Space   [employer] Y strip on left quad and compression over tibial tubercle for pain. Left IT band  lcreate space    Inhibit Muscle  LEft medial hamstring / Left                 PT Education - 06/15/15 1307    Education provided Yes    Education Details POC Explanation of findings. KT tape and initial HEP for hamstring gastroc seated stretches   Person(s) Educated Patient   Methods Explanation;Demonstration;Tactile cues;Verbal cues;Handout   Comprehension Verbalized understanding;Returned demonstration          PT Short Term Goals - 06/15/15 1404    PT SHORT TERM GOAL #1  Title "Independent with initial HEP( 07-13-15)   Time 4   PT SHORT TERM GOAL #2   Title "Report pain decrease at rest from  8 /10 to  4 /10 with standing for greater than 15 minutes 07-13-15   Time 4   PT SHORT TERM GOAL #3   Title "Demonstrate and verbalize understanding of condition management including RICE, positioning, use of A.D., HEP.  07-13-15   Time 4           PT Long Term Goals - 07/14/15 1407    PT LONG TERM GOAL #1   Title "Pt will be independent with advanced HEP.  ( 08-10-15)   Time 8   Period Weeks   Status New   PT LONG TERM GOAL #2   Title "Pain will decrease to 1/10 with all functional activities ( 08-10-15)   Time 8   Period Weeks   Status New   PT LONG TERM GOAL #3   Title "Pt will tolerate standing and walking for 90 minutes to shop without increased pain in order to return to PLOF   Time 8   Period Weeks   PT LONG TERM GOAL #4   Title "FOTO will improve from 62% limitation    to  47% or less   indicating improved functional mobility .    Time 8   PT LONG TERM GOAL #5   Title Pt will improved AROM in  L ankle to at least 7 degrees DF and L  knee to 130 to improve negotiating steps with pain 2/10 or less   Time 8   Period Weeks   Status New               Plan - 2015/07/14 1354    Clinical Impression Statement 66 yo female with long history or back pain and schedule for MRI tomorrow.  Pt presents with pain at 8/10 in left knee .Left knee chondromalacia, Left hamstring tendinitis and Left Pes anserina bursitis.  Pt is limted in walking , negotiating stairs and standing for longer than 5 minutes.  Pt has  limtations in ROM, strength and functional abilitites as well as pain limitation in knee and back. Pt was limted in ability to perform exericises due to back pain at 9/10 and is scheduled for MRI in back tomorrow.   Pt FOTO  with 62% limtation, .  Pt will benefit from 2 x a week for 8 weeks or less to address pain and functinal limitations to return to a decreased pain level for functinal activities    Pt will benefit from skilled therapeutic intervention in order to improve on the following deficits Abnormal gait;Decreased activity tolerance;Decreased mobility;Decreased range of motion;Decreased strength;Pain;Postural dysfunction;Increased muscle spasms;Increased fascial restricitons;Hypomobility;Difficulty walking   Rehab Potential Good   PT Frequency 2x / week   PT Duration 8 weeks   PT Treatment/Interventions ADLs/Self Care Home Management;Cryotherapy;Electrical Stimulation;Iontophoresis 4mg /ml Dexamethasone;Moist Heat;Therapeutic exercise;Functional mobility training;Gait training;Stair training;Ultrasound;Traction;Neuromuscular re-education;Patient/family education;Manual techniques;Taping;Dry needling;Passive range of motion   PT Next Visit Plan Deep tissue to left hamtring. Iontophoresis or modalites for pain.  asssess KT tape and initial exericises. Pt was told she may need to get order for back to address all issues   PT Home Exercise Plan Pt limited to seated hamstring stretch, gastroc, DF stretch because of back pain     Consulted and Agree with Plan of Care Patient          G-Codes - 07-14-15 1324    Functional  Assessment Tool Used FOTO 62% limtation   Functional Limitation Mobility: Walking and moving around   Mobility: Walking and Moving Around Current Status [phone]) At least 60 percent but less than 80 percent impaired, limited or restricted   Mobility: Walking and Moving Around Goal Status [phone]) At least 40 percent but less than 60 percent impaired, limited or restricted        Problem List Patient Active Problem List   Diagnosis Date Noted  . Knee pain, left 11/10/2014  . Sprain of ankle 04/11/2014  . GERD (gastroesophageal reflux disease) 03/07/2014  . Prolapse of vaginal wall with midline cystocele, grade 1 08/23/2013  . Vaginal dryness 05/14/2012  . Preventative health care 05/13/2012  . Dry mouth 05/13/2012  . Depression and insomnia 07/19/2010  . ALLERGIC RHINITIS 07/26/2008  . Multilevel degenerative disc disease of cervical and lumbar spine  01/16/2008  . Essential hypertension 01/01/2008  . Diabetes mellitus type 2, uncontrolled, without complications (no retinopathy) 03/18/2007  . HYPERLIPIDEMIA 02/13/2007  . SLEEP APNEA 02/13/2007    Voncille Lo, PT 06/15/2015 2:18 PM Phone: [phone] Fax: [phone]  By signing I understand that I am ordering/authorizing the use of Iontophoresis using 4 mg/mL of dexamethasone as a component of this plan of care.  [place], [place], [address] Phone: [phone]   Fax:  [phone]  Name: Tiffany [name] MRN: [id number] Date of Birth: 12-Aug-1950

## 2015-06-15 NOTE — Patient Instructions (Signed)
Leg Extension (Hamstring)   Sit toward front edge of chair, with leg out straight, heel on floor, toes pointing toward body. Keeping back straight, bend forward at hip, breathing out through pursed lips. Return, breathing in. Repeat _2-3__ times. For 30 seconds each. Repeat with other leg. Do 3___ sessions per day. To stretch IT band.  Rotate ankle side to side as shown in clinic. Variation: Perform from standing position, with support.  SEATED Gastroc / Heel Cord Stretch - Seated With Towel   Sit on floor, towel around ball of foot. Gently pull foot in toward body, stretching heel cord and calf. Hold for _30__ seconds. Repeat on involved leg. Repeat __3_ times. Do _3__ times per day.  SEATED Soleus Stretch: ANKLE: Dorsiflexion - Sitting   Sitting, place strap around foot. Pull foot toward body, keeping heel on floor. Keep foot straight. Hold _30__ seconds. _3__ reps per set, __3_ sets per day, _7__ days per week        Dorsiflexion: Self-Mobilization (Sitting)   Feet flat, other foot forward, slide left foot back until gentle stretch is felt. Keep entire foot on floor. Hold _30___ seconds. Relax. Repeat __3__ times per set. Do __3__ sessions per day.  Voncille Lo, PT 06/15/2015 1:07 PM Phone: [phone] Fax: [phone]

## 2015-06-19 ENCOUNTER — Ambulatory Visit: Payer: Medicare Other

## 2015-06-20 ENCOUNTER — Ambulatory Visit: Payer: Medicare Other | Admitting: Physical Therapy

## 2015-06-20 DIAGNOSIS — M5441 Lumbago with sciatica, right side: Secondary | ICD-10-CM

## 2015-06-20 DIAGNOSIS — M76892 Other specified enthesopathies of left lower limb, excluding foot: Secondary | ICD-10-CM

## 2015-06-20 DIAGNOSIS — M25562 Pain in left knee: Secondary | ICD-10-CM

## 2015-06-20 DIAGNOSIS — M5442 Lumbago with sciatica, left side: Principal | ICD-10-CM

## 2015-06-20 DIAGNOSIS — M25662 Stiffness of left knee, not elsewhere classified: Secondary | ICD-10-CM

## 2015-06-20 DIAGNOSIS — R262 Difficulty in walking, not elsewhere classified: Secondary | ICD-10-CM

## 2015-06-20 NOTE — Therapy (Signed)
[place], [place], [address] Phone: [phone]   Fax:  [phone]  Physical Therapy Treatment  Patient Details  Name: Tiffany [name] MRN: [id number] Date of Birth: [birth year] Referring Provider: Netta Cedars MD  Encounter Date: 06/20/2015      PT End of Session - 06/20/15 1817    Visit Number 2   Number of Visits 16   Date for PT Re-Evaluation 08/10/15   PT Start Time 1500   PT Stop Time 1550   PT Time Calculation (min) 50 min   Activity Tolerance Patient limited by pain   Behavior During Therapy [facility] for tasks assessed/performed      Past Medical History  Diagnosis Date  . Diabetes mellitus   . Hypertension   . Hyperlipidemia   . Degenerative joint disease   . Sleep apnea     Past Surgical History  Procedure Laterality Date  . Lumbar laminectomy  1989  . Knee arthroscopy      left x 2  . Abdominal hysterectomy  1976    There were no vitals filed for this visit.  Visit Diagnosis:  Bilateral low back pain with sciatica, sciatica laterality unspecified  Knee pain, left  Hamstring tendinitis of left thigh  Decreased ROM of left knee  Difficulty walking      Subjective Assessment - 06/20/15 1459    Subjective I had an MRI and they e-mailed a order for PT for my back.     Currently in Pain? Yes   Pain Score 5    Pain Location Knee   Pain Orientation Right   Pain Descriptors / Indicators Aching;Sore   Aggravating Factors  standing to  longer, walking longer,  wakes her in the night   Pain Relieving Factors heat ice meds,  using cane only when it hurts a whole lot, or  in community for shopping.     Multiple Pain Sites Yes   Pain Score 9   Pain Location Back   Pain Orientation Lower;Left;Right   Pain Descriptors / Indicators Stabbing   Aggravating Factors  walking, moving                         [facility] Adult PT Treatment/Exercise - 06/20/15 1530    Knee/Hip  Exercises: Stretches   Passive Hamstring Stretch Limitations did not tolerate   Other Knee/Hip Stretches DF ankle   Knee/Hip Exercises: Sidelying   Clams too painful   Manual Therapy   Manual therapy comments instrument assist hamstrings , light pressure to moderate pressure tolerated,  Tissue softened,  Gentle stretch in sidelying and supine limited by her back pain   [employer];Inhibit Muscle  2 Y's anterior knee, X lateral knee posterior medial 50%,                  PT Short Term Goals - 06/20/15 1819    PT SHORT TERM GOAL #1   Title "Independent with initial HEP( 07-13-15)   Time 4   Period Weeks   Status On-going   PT SHORT TERM GOAL #2   Title "Report pain decrease at rest from  8 /10 to  4 /10 with standing for greater than 15 minutes 07-13-15   Time 4   Period Weeks   Status On-going   PT SHORT TERM GOAL #3   Time 4   Period Weeks   Status On-going  PT Long Term Goals - 06/15/15 1407    PT LONG TERM GOAL #1   Title "Pt will be independent with advanced HEP.  ( 08-10-15)   Time 8   Period Weeks   Status New   PT LONG TERM GOAL #2   Title "Pain will decrease to 1/10 with all functional activities ( 08-10-15)   Time 8   Period Weeks   Status New   PT LONG TERM GOAL #3   Title "Pt will tolerate standing and walking for 90 minutes to shop without increased pain in order to return to PLOF   Time 8   Period Weeks   PT LONG TERM GOAL #4   Title "FOTO will improve from 62% limitation    to  47% or less   indicating improved functional mobility .    Time 8   PT LONG TERM GOAL #5   Title Pt will improved AROM in  L ankle to at least 7 degrees DF and L  knee to 130 to improve negotiating steps with pain 2/10 or less   Time 8   Period Weeks   Status New               Plan - 06/20/15 1817    Clinical Impression Statement Tape helpful,  she tolerated manual from prone position, Back very limiting.    PT Next Visit Plan Assess  moderate  tissue to left hamtring. Iontophoresis or modalites for pain.  asssess KT tape and initial exericises. Pt was told she may need to get order for back to address all issues  ( has been faxed but we do not have yet)   Consulted and Agree with Plan of Care Patient        Problem List Patient Active Problem List   Diagnosis Date Noted  . Knee pain, left 11/10/2014  . Sprain of ankle 04/11/2014  . GERD (gastroesophageal reflux disease) 03/07/2014  . Prolapse of vaginal wall with midline cystocele, grade 1 08/23/2013  . Vaginal dryness 05/14/2012  . Preventative health care 05/13/2012  . Dry mouth 05/13/2012  . Depression and insomnia 07/19/2010  . ALLERGIC RHINITIS 07/26/2008  . Multilevel degenerative disc disease of cervical and lumbar spine  01/16/2008  . Essential hypertension 01/01/2008  . Diabetes mellitus type 2, uncontrolled, without complications (no retinopathy) 03/18/2007  . HYPERLIPIDEMIA 02/13/2007  . SLEEP APNEA 02/13/2007    HARRIS,KAREN 06/20/2015, 6:21 PM  [facility] [address] [place], [place], [address] Phone: [phone]   Fax:  [phone]  Name: Tiffany [name] MRN: [id number] Date of Birth: 1951-02-09    Melvenia Needles, PTA 06/20/2015 6:21 PM Phone: [phone] Fax: [phone]

## 2015-06-22 ENCOUNTER — Ambulatory Visit: Payer: Medicare Other | Admitting: Physical Therapy

## 2015-06-22 DIAGNOSIS — M25673 Stiffness of unspecified ankle, not elsewhere classified: Secondary | ICD-10-CM

## 2015-06-22 DIAGNOSIS — M76892 Other specified enthesopathies of left lower limb, excluding foot: Secondary | ICD-10-CM

## 2015-06-22 DIAGNOSIS — M25562 Pain in left knee: Secondary | ICD-10-CM

## 2015-06-22 DIAGNOSIS — R262 Difficulty in walking, not elsewhere classified: Secondary | ICD-10-CM

## 2015-06-22 DIAGNOSIS — M5441 Lumbago with sciatica, right side: Secondary | ICD-10-CM | POA: Diagnosis not present

## 2015-06-22 DIAGNOSIS — M25662 Stiffness of left knee, not elsewhere classified: Secondary | ICD-10-CM

## 2015-06-22 NOTE — Therapy (Signed)
[place], [place], [address] Phone: [phone]   Fax:  [phone]  Physical Therapy Treatment  Patient Details  Name: Tiffany [name] MRN: [id number] Date of Birth: 1950-11-02 Referring Provider: Netta Cedars MD  Encounter Date: 06/22/2015      PT End of Session - 06/22/15 1548    Visit Number 3   Number of Visits 16   Date for PT Re-Evaluation 08/10/15   PT Start Time 1501   PT Stop Time 1602   PT Time Calculation (min) 61 min   Activity Tolerance Patient limited by pain;Patient tolerated treatment well   Behavior During Therapy [facility] for tasks assessed/performed      Past Medical History  Diagnosis Date  . Diabetes mellitus   . Hypertension   . Hyperlipidemia   . Degenerative joint disease   . Sleep apnea     Past Surgical History  Procedure Laterality Date  . Lumbar laminectomy  1989  . Knee arthroscopy      left x 2  . Abdominal hysterectomy  1976    There were no vitals filed for this visit.  Visit Diagnosis:  Knee pain, left  Hamstring tendinitis of left thigh  Decreased ROM of left knee  Difficulty walking  Decreased ROM of ankle      Subjective Assessment - 06/22/15 1505    Subjective 6/10 knee LT lateral.  Back 7/10.  Has not been moving around too much todAY   Currently in Pain? Yes   Pain Score 6    Pain Location Knee   Pain Orientation Right                         [facility] Adult PT Treatment/Exercise - 06/22/15 0001    Self-Care   Self-Care --  ADL,  Log rolling,  ADL handout issued, will review later   Knee/Hip Exercises: Stretches   Sports administrator --  passive knee flexion 10 X  prone with knee pad   Other Knee/Hip Stretches passiv ankle stretch,.   Knee/Hip Exercises: Prone   Hamstring Curl Limitations AAknee flexion   Modalities   Modalities --  moist heat distal and proximal hamstrings and gluteal  LT   Manual Therapy   Manual therapy  comments instrumeny assist and manual soft tissue work LT hamstrings  and lateral knee                PT Education - 06/22/15 1548    Education provided Yes   Education Details ADL, log roll   Person(s) Educated Patient   Methods Explanation;Demonstration;Verbal cues;Handout   Comprehension Verbalized understanding          PT Short Term Goals - 06/20/15 1819    PT SHORT TERM GOAL #1   Title "Independent with initial HEP( 07-13-15)   Time 4   Period Weeks   Status On-going   PT SHORT TERM GOAL #2   Title "Report pain decrease at rest from  8 /10 to  4 /10 with standing for greater than 15 minutes 07-13-15   Time 4   Period Weeks   Status On-going   PT SHORT TERM GOAL #3   Time 4   Period Weeks   Status On-going           PT Long Term Goals - 06/15/15 1407    PT LONG TERM GOAL #1   Title "Pt will be independent with advanced HEP.  (  08-10-15)   Time 8   Period Weeks   Status New   PT LONG TERM GOAL #2   Title "Pain will decrease to 1/10 with all functional activities ( 08-10-15)   Time 8   Period Weeks   Status New   PT LONG TERM GOAL #3   Title "Pt will tolerate standing and walking for 90 minutes to shop without increased pain in order to return to PLOF   Time 8   Period Weeks   PT LONG TERM GOAL #4   Title "FOTO will improve from 62% limitation    to  47% or less   indicating improved functional mobility .    Time 8   PT LONG TERM GOAL #5   Title Pt will improved AROM in  L ankle to at least 7 degrees DF and L  knee to 130 to improve negotiating steps with pain 2/10 or less   Time 8   Period Weeks   Status New               Plan - 06/22/15 1549    Clinical Impression Statement Patient is a little more comfortable today.  Signed MD note is not back, awaiting order for iontophoresis.  Progress toward self care goal.   PT Next Visit Plan answer ADL questions.  Back eval   PT Home Exercise Plan continue stretching as able   Consulted and  Agree with Plan of Care Patient        Problem List Patient Active Problem List   Diagnosis Date Noted  . Knee pain, left 11/10/2014  . Sprain of ankle 04/11/2014  . GERD (gastroesophageal reflux disease) 03/07/2014  . Prolapse of vaginal wall with midline cystocele, grade 1 08/23/2013  . Vaginal dryness 05/14/2012  . Preventative health care 05/13/2012  . Dry mouth 05/13/2012  . Depression and insomnia 07/19/2010  . ALLERGIC RHINITIS 07/26/2008  . Multilevel degenerative disc disease of cervical and lumbar spine  01/16/2008  . Essential hypertension 01/01/2008  . Diabetes mellitus type 2, uncontrolled, without complications (no retinopathy) 03/18/2007  . HYPERLIPIDEMIA 02/13/2007  . SLEEP APNEA 02/13/2007    Tiffany [name] 06/22/2015, 3:52 PM  [facility] [address] [place], [place], [address] Phone: [phone]   Fax:  [phone]  Name: Tiffany [name] MRN: [id number] Date of Birth: 09-18-1950    Melvenia Needles, PTA 06/22/2015 3:52 PM Phone: [phone] Fax: [phone]

## 2015-06-22 NOTE — Patient Instructions (Signed)

## 2015-07-03 ENCOUNTER — Ambulatory Visit: Payer: Medicare Other | Attending: Orthopedic Surgery | Admitting: Physical Therapy

## 2015-07-03 DIAGNOSIS — R262 Difficulty in walking, not elsewhere classified: Secondary | ICD-10-CM | POA: Diagnosis not present

## 2015-07-03 DIAGNOSIS — M5441 Lumbago with sciatica, right side: Secondary | ICD-10-CM | POA: Diagnosis not present

## 2015-07-03 DIAGNOSIS — M5442 Lumbago with sciatica, left side: Secondary | ICD-10-CM | POA: Insufficient documentation

## 2015-07-03 DIAGNOSIS — M76892 Other specified enthesopathies of left lower limb, excluding foot: Secondary | ICD-10-CM

## 2015-07-03 DIAGNOSIS — M25662 Stiffness of left knee, not elsewhere classified: Secondary | ICD-10-CM

## 2015-07-03 DIAGNOSIS — R29898 Other symptoms and signs involving the musculoskeletal system: Secondary | ICD-10-CM | POA: Diagnosis not present

## 2015-07-03 DIAGNOSIS — M25562 Pain in left knee: Secondary | ICD-10-CM | POA: Diagnosis not present

## 2015-07-03 DIAGNOSIS — M4316 Spondylolisthesis, lumbar region: Secondary | ICD-10-CM | POA: Insufficient documentation

## 2015-07-03 DIAGNOSIS — M25673 Stiffness of unspecified ankle, not elsewhere classified: Secondary | ICD-10-CM

## 2015-07-03 DIAGNOSIS — M65852 Other synovitis and tenosynovitis, left thigh: Secondary | ICD-10-CM | POA: Insufficient documentation

## 2015-07-03 NOTE — Therapy (Signed)
[place], [place], [address] Phone: [phone]   Fax:  [phone]  Physical Therapy Evaluation for Back/knee  Patient Details  Name: Tiffany [name] MRN: [id number] Date of Birth: [birth year] Referring Provider: Hessie Diener MD  Encounter Date: 07/03/2015      PT End of Session - 07/03/15 1513    Visit Number 4   Number of Visits 16   Date for PT Re-Evaluation 08/28/15   Authorization Type [employer] medicare   Authorization Time Period 08-28-15   PT Start Time 0217   PT Stop Time 0325   PT Time Calculation (min) 68 min   Activity Tolerance Patient tolerated treatment well   Behavior During Therapy [facility] for tasks assessed/performed      Past Medical History  Diagnosis Date  . Diabetes mellitus   . Hypertension   . Hyperlipidemia   . Degenerative joint disease   . Sleep apnea     Past Surgical History  Procedure Laterality Date  . Lumbar laminectomy  1989  . Knee arthroscopy      left x 2  . Abdominal hysterectomy  1976    There were no vitals filed for this visit.  Visit Diagnosis:  Knee pain, left  Hamstring tendinitis of left thigh  Decreased ROM of left knee  Difficulty walking  Decreased ROM of ankle  Bilateral low back pain with sciatica, sciatica laterality unspecified  Difficulty walking down stairs  Difficulty walking up stairs  Spondylolisthesis of lumbar region      Subjective Assessment - 07/03/15 1431    Subjective 5/10 knee and back is 8/10  and hurts whenevery I do housework, sweeping, laundry   Pertinent History knee surgery on left x 2 torn ligaments and cartilage 2004?,  1989 Lumbar lamiinectomy   Limitations Standing;Walking;House hold activities;Sitting   How long can you sit comfortably? unlimited  sit to stand   How long can you stand comfortably? 15 min   How long can you walk comfortably? 15 min   Patient Stated Goals get rid of knee pain  and back  pain to do house work back ,. stand longer  to shopping/ trying to shop a little now   Currently in Pain? Yes   Pain Score 5    Pain Orientation Left   Pain Descriptors / Indicators Aching;Sore   Pain Score 8   Pain Location Back   Pain Orientation Lower;Left;Right   Pain Descriptors / Indicators Stabbing;Throbbing;Sharp   Pain Type Chronic pain   Pain Radiating Towards I feel on both but more on the left    Pain Onset More than a month ago   Pain Frequency Constant   Aggravating Factors  sitting, walking , moving   Pain Relieving Factors lying down, or use heat            [facility] PT Assessment - 07/03/15 1433    Assessment   Medical Diagnosis low back pain  Spondylolisthesis at L4 L5 level   Referring Provider Hessie Diener MD   Onset Date/Surgical Date 07/02/14   Hand Dominance Right   Prior Therapy some for knee    Precautions   Precautions Back   Precaution Comments Spondylolisthesis L4 L5   Restrictions   Weight Bearing Restrictions No   Balance Screen   Has the patient fallen in the past 6 months No   Has the patient had a decrease in activity level because of a fear of falling?  No  Is the patient reluctant to leave their home because of a fear of falling?  No   Home Environment   Living Environment Private residence   Living Arrangements Alone   Type of [place] Access Level entry  on ground floor   Prior Function   Level of Independence Independent   Vocation On disability  back    Cognition   Overall Cognitive Status Within Functional Limits for tasks assessed   Observation/Other Assessments   Focus on Therapeutic Outcomes (FOTO)  Intake for back 31%, predicted 54%  limitation is 69%   Posture/Postural Control   Posture/Postural Control Postural limitations   Postural Limitations Posterior pelvic tilt;Flexed trunk  pt whole body leans forward and flexed sacral sitting   AROM   Right Knee Extension 0   Right Knee Flexion 134   Left  Knee Extension 0   Left Knee Flexion 130   Right Ankle Dorsiflexion 6   Right Ankle Plantar Flexion 53   Left Ankle Dorsiflexion 0   Left Ankle Plantar Flexion 52   Lumbar Flexion 35  tight and pain   Lumbar Extension 13   Lumbar - Right Side Bend 20  pain   Lumbar - Left Side Bend 25  pain   Lumbar - Right Rotation 70%   Lumbar - Left Rotation 90%   Strength   Right Hip Flexion 4-/5   Right Hip Extension 3/5   Right Hip ABduction 3/5   Left Hip Flexion 3/5   Left Hip Extension 4-/5   Left Hip ABduction 2/5   Right Knee Flexion 4+/5   Right Knee Extension 4+/5   Left Knee Flexion 3/5  limited by pain   Left Knee Extension 4-/5   Right Ankle Dorsiflexion 4+/5   Left Ankle Dorsiflexion 4-/5   Flexibility   Hamstrings left 55, Right 75.    Palpation   Palpation comment tenderness over bil greater trochanters but marked tenderness over the sacral line and PSIS  bilaterall  pt with difficulty turning on sides bilaterally  Psoas bil tight   Transfers   Comments Pt with VC for log rolling to protect back   Ambulation/Gait   Assistive device None   Gait Pattern Decreased stance time - left;Decreased step length - right;Antalgic   Gait velocity 1.62  ft/sec   Gait Comments Pt has difficulty managing steps and has recently moved to level apartment    Berg Balance Test   Sit to Stand Able to stand without using hands and stabilize independently   Standing Unsupported Able to stand safely 2 minutes   Sitting with Back Unsupported but Feet Supported on Floor or Stool Able to sit safely and securely 2 minutes   Stand to Sit Sits safely with minimal use of hands   Transfers Able to transfer safely, minor use of hands   Standing Unsupported with Eyes Closed Able to stand 10 seconds safely   Standing Ubsupported with Feet Together Able to place feet together independently and stand 1 minute safely   From Standing, Reach Forward with Outstretched Arm Can reach forward >12 cm safely (5")    From Standing Position, Pick up Object from Floor Able to pick up shoe safely and easily  added time for safety   From Standing Position, Turn to Look Behind Over each Shoulder Looks behind from both sides and weight shifts well   Turn 360 Degrees Able to turn 360 degrees safely in 4 seconds or less   Standing  Unsupported, Alternately Place Feet on Step/Stool Able to stand independently and complete 8 steps >20 seconds   Standing Unsupported, One Foot in Front Able to place foot tandem independently and hold 30 seconds   Standing on One Leg Able to lift leg independently and hold > 10 seconds  Right was 21 sec and left 18 sec    Total Score 54   Berg comment: Pt with core weakness which contributes to unsteadiness.  Pt has recently moved to level 1st floor apartment to avoid climbing stairs.                   [facility] Adult PT Treatment/Exercise - 07/03/15 1433    Posture/Postural Control   Posture Comments Educated on sitting and standing posture    Knee/Hip Exercises: Stretches   Hip Flexor Stretch 1 rep   Hip Flexor Stretch Limitations Pt with need for opposite hip fllexed to protect back.  static stretch for 3 minutes with knee bent over bed , no  pain with stretch should be felt.    Moist Heat Therapy   Number Minutes Moist Heat 15 Minutes   Moist Heat Location Lumbar Spine   Electrical Stimulation   Electrical Stimulation Location low back 4 electrode   Electrical Stimulation Action IFC   Electrical Stimulation Parameters to pt tol for 15 minutes   Electrical Stimulation Goals Pain                PT Education - 07/03/15 1511    Education provided Yes   Education Details POC for back and knee and hip, explanation of spondylolisthesis and hip flexor stretch and posture   Person(s) Educated Patient   Methods Explanation;Demonstration;Verbal cues;Handout;Tactile cues   Comprehension Verbalized understanding;Returned demonstration          PT Short Term  Goals - 07/03/15 1600    PT SHORT TERM GOAL #1   Title "Independent with initial HEP( 07-31-15)   Time 4   Period Weeks   Status On-going   PT SHORT TERM GOAL #2   Title "Report pain decrease at rest from  8 /10 to  4 /10 with standing for greater than 15 minutes 07-31-15   Time 4   Period Weeks   Status On-going   PT SHORT TERM GOAL #3   Title "Demonstrate and verbalize understanding of condition management including RICE, positioning, use of A.D., HEP.  07-31-15   Time 4   Period Weeks   Status On-going   PT SHORT TERM GOAL #4   Title "Demonstrate understanding of proper sitting posture and be more conscious of position and posture throughout the day. 07-31-15   Time 4   Period Weeks   Status New           PT Long Term Goals - 07/03/15 1602    PT LONG TERM GOAL #1   Title "Pt will be independent with advanced HEP.  08-28-15   Time 8   Period Weeks   Status New   PT LONG TERM GOAL #2   Title "Pain will decrease to 3/10 or less with all functional activities 08-28-15   Time 8   Period Weeks   Status New   PT LONG TERM GOAL #3   Title "Pt will tolerate standing and walking for 90 minutes to shop without increased pain in order to return to PLOF 08-28-15   Time 8   Period Weeks   Status New   PT LONG TERM GOAL #4  Title "FOTO will improve from 69% limitation    to  40% or less   indicating improved functional mobility .    Time 8   Period Weeks   Status New   PT LONG TERM GOAL #5   Title Pt will improved AROM in  L ankle to at least 7 degrees DF and L  knee to 130 to improve negotiating steps with pain 2/10 or less   Time 8   Period Weeks   Status New   PT LONG TERM GOAL #6   Title Pt will be able to negotiate steps (12)  3/10 level or less   Time 8   Period Weeks   Status New   PT LONG TERM GOAL #7   Title Pt will not wake more than 1 time a night while rolling over in bed due to pain   Time 8   Period Weeks   Status New   PT LONG TERM GOAL #8   Title Pt will be able  to perform household tasks with proper body mechanics and posture to reduce exacerbation of pain.    PT LONG TERM GOAL  #9   TITLE Pt will increased gait velocity to at least 2.62 ft/sec to be at community based level with LRAD   Baseline 1.62 ft/sec   Time 8   Period Weeks   Status New               Plan - 07/27/15 1548    Clinical Impression Statement 65 yo female currently being seen for left knee pes anerina and weakness  presents with bil back pain and RX for treatment for spondylolisthesis L-4/5 level .  Previously seen for chondromalacia, pes anserina bursitis on left knee some improvement.  Pt was concerned about Balance.  Pt with 54/56 BERG balance but core weakness may contribute to feelings of unsteadiness.  Pt with  decreased AROM of lumbar , functional weakness that causes her to take extra time toperform bed mobility and transfers and walking.  Gati velocity reduced to 1.66ft/sed with pt  slower due to safety and unsteadiness. Pt does not report any falls in last 6 months. but will benefti from core conditioning to increased steadiness and safety for level surfaces and stairts in her community.  Pt with marked tenderness bil lumbar slpne and hips. Pt may beneftt from TENS unit  for pain control at home.    Pt will benefit from skilled therapeutic intervention in order to improve on the following deficits Abnormal gait;Decreased activity tolerance;Decreased mobility;Decreased range of motion;Decreased strength;Pain;Postural dysfunction;Increased muscle spasms;Increased fascial restricitons;Hypomobility;Difficulty walking   Rehab Potential Good   PT Frequency 2x / week   PT Duration 8 weeks   PT Treatment/Interventions ADLs/Self Care Home Management;Cryotherapy;Electrical Stimulation;Iontophoresis 4mg /ml Dexamethasone;Moist Heat;Therapeutic exercise;Functional mobility training;Gait training;Stair training;Ultrasound;Traction;Neuromuscular re-education;Patient/family  education;Manual techniques;Taping;Dry needling;Passive range of motion  TENS unit   PT Next Visit Plan spndylolisthesis hand out flexion based core exericises. check hip flexor stretch for psoas tightness   PT Home Exercise Plan continue stretching as able in pain free mode.   Consulted and Agree with Plan of Care Patient          G-Codes - 07/27/15 1537    Functional Assessment Tool Used FOTO  limitation for back/knee 69% 07/27/15 back eval added today   Functional Limitation Mobility: Walking and moving around   Mobility: Walking and Moving Around Current Status [id number]) At least 60 percent but less than 80 percent impaired, limited or  restricted  69% back plus knee   Mobility: Walking and Moving Around Goal Status [phone]) At least 20 percent but less than 40 percent impaired, limited or restricted       Problem List Patient Active Problem List   Diagnosis Date Noted  . Knee pain, left 11/10/2014  . Sprain of ankle 04/11/2014  . GERD (gastroesophageal reflux disease) 03/07/2014  . Prolapse of vaginal wall with midline cystocele, grade 1 08/23/2013  . Vaginal dryness 05/14/2012  . Preventative health care 05/13/2012  . Dry mouth 05/13/2012  . Depression and insomnia 07/19/2010  . ALLERGIC RHINITIS 07/26/2008  . Multilevel degenerative disc disease of cervical and lumbar spine  01/16/2008  . Essential hypertension 01/01/2008  . Diabetes mellitus type 2, uncontrolled, without complications (no retinopathy) 03/18/2007  . HYPERLIPIDEMIA 02/13/2007  . SLEEP APNEA 02/13/2007    Voncille Lo, PT 07/03/2015 4:14 PM Phone: [phone] Fax: [phone] By signing I understand that I am ordering/authorizing the use of Iontophoresis using 4 mg/mL of dexamethasone as a component of this plan of care.  [place], [place], [address] Phone: [phone]   Fax:  [phone]  Name: EMELLIE THAYNE MRN:  [id number] Date of Birth: November 29, 1950

## 2015-07-03 NOTE — Patient Instructions (Signed)
   Hip Flexor Stretch   Lying on back near edge of bed, bend one leg, foot flat. Hang other leg over edge, relaxed, thigh resting entirely on bed for __up to 3 __ minutes. Repeat __2__ times. Do __1-2__ sessions per day. Do for each side right and left Posture Tips DO: - stand tall and erect - keep chin tucked in - keep head and shoulders in alignment - check posture regularly in mirror or large window - pull head back against headrest in car seat;  Change your position often.  Sit with lumbar support. DON'T: - slouch or slump while watching TV or reading - sit, stand or lie in one position  for too long;  Sitting is especially hard on the spine so if you sit at a desk/use the computer, then stand up often!   Copyright  VHI. All rights reserved.  Posture - Standing   Good posture is important. Avoid slouching and forward head thrust. Maintain curve in low back and align ears over shoul- ders, hips over ankles.  Pull your belly button in toward your back bone. Stand with even weight on heels and toes.  Stand with ribs lifted up and chin down for good posture.   Copyright  VHI. All rights reserved.  Posture - Sitting   Sit upright, head facing forward. Try using a roll to support lower back. Keep shoulders relaxed, and avoid rounded back. Keep hips level with knees. Avoid crossing legs for long periods. Sit on your sit bones not tail bone.    Copyright  VHI. All rights reserved.    Voncille Lo, PT 07/03/2015 3:09 PM Phone: [phone] Fax: [phone]

## 2015-07-05 ENCOUNTER — Other Ambulatory Visit: Payer: Self-pay | Admitting: Internal Medicine

## 2015-07-06 ENCOUNTER — Ambulatory Visit: Payer: Medicare Other | Admitting: Physical Therapy

## 2015-07-06 DIAGNOSIS — M4316 Spondylolisthesis, lumbar region: Secondary | ICD-10-CM

## 2015-07-06 DIAGNOSIS — M5441 Lumbago with sciatica, right side: Secondary | ICD-10-CM

## 2015-07-06 DIAGNOSIS — M5442 Lumbago with sciatica, left side: Secondary | ICD-10-CM

## 2015-07-06 DIAGNOSIS — M25562 Pain in left knee: Secondary | ICD-10-CM | POA: Diagnosis not present

## 2015-07-06 NOTE — Therapy (Signed)
[place], [place], [address] Phone: [phone]   Fax:  [phone]  Physical Therapy Treatment  Patient Details  Name: Tiffany [name] MRN: [id number] Date of Birth: 07-Oct-1950 Referring Provider: Hessie Diener MD  Encounter Date: 07/06/2015      PT End of Session - 07/06/15 1800    Visit Number 5   Number of Visits 16   Date for PT Re-Evaluation 08/28/15   PT Start Time 1505   PT Stop Time 1605   PT Time Calculation (min) 60 min   Activity Tolerance Patient tolerated treatment well   Behavior During Therapy [facility] for tasks assessed/performed      Past Medical History  Diagnosis Date  . Diabetes mellitus   . Hypertension   . Hyperlipidemia   . Degenerative joint disease   . Sleep apnea     Past Surgical History  Procedure Laterality Date  . Lumbar laminectomy  1989  . Knee arthroscopy      left x 2  . Abdominal hysterectomy  1976    There were no vitals filed for this visit.  Visit Diagnosis:  Spondylolisthesis of lumbar region  Bilateral low back pain with sciatica, sciatica laterality unspecified      Subjective Assessment - 07/06/15 1754    Subjective Back is bad today.  it is an 8 or 9/10.     Currently in Pain? Yes   Pain Score 5    Pain Location Knee   Pain Orientation Left   Pain Descriptors / Indicators Aching;Sore   Aggravating Factors  walking ,, standing longer   Pain Relieving Factors heat, tape   Pain Location Back   Pain Orientation Right;Left;Lower   Pain Descriptors / Indicators Sharp;Throbbing;Stabbing   Pain Frequency Constant   Aggravating Factors  sitting walking moving   Pain Relieving Factors heat lying down                         [facility] Adult PT Treatment/Exercise - 07/06/15 1510    Lumbar Exercises: Stretches   Double Knee to Chest Stretch 1 rep;10 seconds  patient did not like   Pelvic Tilt 5 reps  sore   Piriformis Stretch 1  rep;10 seconds  tender   Piriformis Stretch Limitations Psoas stretch 1 X RT/LT.LT tight, RT non tight.   Lumbar Exercises: Supine   Ab Set 5 reps   AB Set Limitations shortening with arm slides 5 X   Bent Knee Raise 5 reps  Also,  bracing with arm movements and arm/leg 5-10 x   Moist Heat Therapy   Number Minutes Moist Heat 15 Minutes   Moist Heat Location Lumbar Spine                PT Education - 07/06/15 1800    Education provided Yes   Education Details spondylothesis exercises   Person(s) Educated Patient   Methods Explanation;Tactile cues;Verbal cues;Handout   Comprehension Verbalized understanding;Returned demonstration          PT Short Term Goals - 07/03/15 1600    PT SHORT TERM GOAL #1   Title "Independent with initial HEP( 07-31-15)   Time 4   Period Weeks   Status On-going   PT SHORT TERM GOAL #2   Title "Report pain decrease at rest from  8 /10 to  4 /10 with standing for greater than 15 minutes 07-31-15   Time 4   Period Weeks  Status On-going   PT SHORT TERM GOAL #3   Title "Demonstrate and verbalize understanding of condition management including RICE, positioning, use of A.D., HEP.  07-31-15   Time 4   Period Weeks   Status On-going   PT SHORT TERM GOAL #4   Title "Demonstrate understanding of proper sitting posture and be more conscious of position and posture throughout the day. 07-31-15   Time 4   Period Weeks   Status New           PT Long Term Goals - 07/03/15 1602    PT LONG TERM GOAL #1   Title "Pt will be independent with advanced HEP.  08-28-15   Time 8   Period Weeks   Status New   PT LONG TERM GOAL #2   Title "Pain will decrease to 3/10 or less with all functional activities 08-28-15   Time 8   Period Weeks   Status New   PT LONG TERM GOAL #3   Title "Pt will tolerate standing and walking for 90 minutes to shop without increased pain in order to return to PLOF 08-28-15   Time 8   Period Weeks   Status New   PT LONG TERM GOAL  #4   Title "FOTO will improve from 69% limitation    to  40% or less   indicating improved functional mobility .    Time 8   Period Weeks   Status New   PT LONG TERM GOAL #5   Title Pt will improved AROM in  L ankle to at least 7 degrees DF and L  knee to 130 to improve negotiating steps with pain 2/10 or less   Time 8   Period Weeks   Status New   PT LONG TERM GOAL #6   Title Pt will be able to negotiate steps (12)  3/10 level or less   Time 8   Period Weeks   Status New   PT LONG TERM GOAL #7   Title Pt will not wake more than 1 time a night while rolling over in bed due to pain   Time 8   Period Weeks   Status New   PT LONG TERM GOAL #8   Title Pt will be able to perform household tasks with proper body mechanics and posture to reduce exacerbation of pain.    PT LONG TERM GOAL  #9   TITLE Pt will increased gait velocity to at least 2.62 ft/sec to be at community based level with LRAD   Baseline 1.62 ft/sec   Time 8   Period Weeks   Status New               Plan - 07/06/15 1801    Clinical Impression Statement [place] toward home exercises.  encouragement needed and given.  Pain flare today prior to session   PT Next Visit Plan spndylolisthesis hand out flexion based core exericises.   Consulted and Agree with Plan of Care Patient        Problem List Patient Active Problem List   Diagnosis Date Noted  . Knee pain, left 11/10/2014  . Sprain of ankle 04/11/2014  . GERD (gastroesophageal reflux disease) 03/07/2014  . Prolapse of vaginal wall with midline cystocele, grade 1 08/23/2013  . Vaginal dryness 05/14/2012  . Preventative health care 05/13/2012  . Dry mouth 05/13/2012  . Depression and insomnia 07/19/2010  . ALLERGIC RHINITIS 07/26/2008  . Multilevel degenerative disc disease of cervical  and lumbar spine  01/16/2008  . Essential hypertension 01/01/2008  . Diabetes mellitus type 2, uncontrolled, without complications (no retinopathy) 03/18/2007  .  HYPERLIPIDEMIA 02/13/2007  . SLEEP APNEA 02/13/2007    HARRIS,KAREN 07/06/2015, 6:05 PM  [facility] [address] [place], [place], [address] Phone: [phone]   Fax:  [phone]  Name: Tiffany [name] MRN: [id number] Date of Birth: Jun 17, 1950    Melvenia Needles, PTA 07/06/2015 6:05 PM Phone: [phone] Fax: [phone]

## 2015-07-10 ENCOUNTER — Ambulatory Visit: Payer: Medicare Other | Admitting: Physical Therapy

## 2015-07-11 ENCOUNTER — Other Ambulatory Visit: Payer: Self-pay | Admitting: Family

## 2015-07-12 NOTE — Telephone Encounter (Signed)
Called pt no answer [facility] rx ready for pick-up.../LMB 

## 2015-07-13 ENCOUNTER — Ambulatory Visit: Payer: Medicare Other | Admitting: Physical Therapy

## 2015-07-14 ENCOUNTER — Ambulatory Visit (INDEPENDENT_AMBULATORY_CARE_PROVIDER_SITE_OTHER): Payer: Medicare Other | Admitting: Family

## 2015-07-14 ENCOUNTER — Encounter: Payer: Self-pay | Admitting: Family

## 2015-07-14 VITALS — BP 150/90 | HR 91 | Temp 98.7°F | Resp 16 | Ht 63.0 in | Wt 151.0 lb

## 2015-07-14 DIAGNOSIS — J0110 Acute frontal sinusitis, unspecified: Secondary | ICD-10-CM | POA: Diagnosis not present

## 2015-07-14 DIAGNOSIS — G479 Sleep disorder, unspecified: Secondary | ICD-10-CM | POA: Diagnosis not present

## 2015-07-14 MED ORDER — AMOXICILLIN-POT CLAVULANATE 875-125 MG PO TABS: 1.0000 | ORAL_TABLET | Freq: Two times a day (BID) | ORAL | Status: DC

## 2015-07-14 MED ORDER — ZOLPIDEM TARTRATE 10 MG PO TABS: 10.0000 mg | ORAL_TABLET | Freq: Every evening | ORAL | Status: DC | PRN

## 2015-07-14 NOTE — Patient Instructions (Signed)
Thank you for choosing [facility].  Summary/Instructions:  Your prescription(s) have been submitted to your pharmacy or been printed and provided for you. Please take as directed and contact our office if you believe you are having problem(s) with the medication(s) or have any questions.  If your symptoms worsen or fail to improve, please contact our office for further instruction, or in case of emergency go directly to the emergency room at the closest medical facility.   General Recommendations:    Please drink plenty of fluids.  Get plenty of rest   Sleep in humidified air  Use saline nasal sprays  Netti pot   OTC Medications:  Decongestants - helps relieve congestion   Flonase (generic fluticasone) or Nasacort (generic triamcinolone) - please make sure to use the "cross-over" technique at a 45 degree angle towards the opposite eye as opposed to straight up the nasal passageway.   Sudafed (generic pseudoephedrine - Note this is the one that is available behind the pharmacy counter); Products with phenylephrine (-PE) may also be used but is often not as effective as pseudoephedrine.   If you have HIGH BLOOD PRESSURE - Coricidin HBP; AVOID any product that is -D as this contains pseudoephedrine which may increase your blood pressure.  Afrin (oxymetazoline) every 6-8 hours for up to 3 days.   Allergies - helps relieve runny nose, itchy eyes and sneezing   Claritin (generic loratidine), Allegra (fexofenidine), or Zyrtec (generic cyrterizine) for runny nose. These medications should not cause drowsiness.  Note - Benadryl (generic diphenhydramine) may be used however may cause drowsiness  Cough -   Delsym or Robitussin (generic dextromethorphan)  Expectorants - helps loosen mucus to ease removal   Mucinex (generic guaifenesin) as directed on the package.  Headaches / General Aches   Tylenol (generic acetaminophen) - DO NOT EXCEED 3 grams (3,000 mg) in a 24  hour time period  Advil/Motrin (generic ibuprofen)   Sore Throat -   Salt water gargle   Chloraseptic (generic benzocaine) spray or lozenges / Sucrets (generic dyclonine)    Sinusitis Sinusitis is redness, soreness, and inflammation of the paranasal sinuses. Paranasal sinuses are air pockets within the bones of your face (beneath the eyes, the middle of the forehead, or above the eyes). In healthy paranasal sinuses, mucus is able to drain out, and air is able to circulate through them by way of your nose. However, when your paranasal sinuses are inflamed, mucus and air can become trapped. This can allow bacteria and other germs to grow and cause infection. Sinusitis can develop quickly and last only a short time (acute) or continue over a long period (chronic). Sinusitis that lasts for more than 12 weeks is considered chronic.  CAUSES  Causes of sinusitis include:  Allergies.  Structural abnormalities, such as displacement of the cartilage that separates your nostrils (deviated septum), which can decrease the air flow through your nose and sinuses and affect sinus drainage.  Functional abnormalities, such as when the small hairs (cilia) that line your sinuses and help remove mucus do not work properly or are not present. SIGNS AND SYMPTOMS  Symptoms of acute and chronic sinusitis are the same. The primary symptoms are pain and pressure around the affected sinuses. Other symptoms include:  Upper toothache.  Earache.  Headache.  Bad breath.  Decreased sense of smell and taste.  A cough, which worsens when you are lying flat.  Fatigue.  Fever.  Thick drainage from your nose, which often is green and may   contain pus (purulent).  Swelling and warmth over the affected sinuses. DIAGNOSIS  Your health care provider will perform a physical exam. During the exam, your health care provider may:  Look in your nose for signs of abnormal growths in your nostrils (nasal  polyps).  Tap over the affected sinus to check for signs of infection.  View the inside of your sinuses (endoscopy) using an imaging device that has a light attached (endoscope). If your health care provider suspects that you have chronic sinusitis, one or more of the following tests may be recommended:  Allergy tests.  Nasal culture. A sample of mucus is taken from your nose, sent to a lab, and screened for bacteria.  Nasal cytology. A sample of mucus is taken from your nose and examined by your health care provider to determine if your sinusitis is related to an allergy. TREATMENT  Most cases of acute sinusitis are related to a viral infection and will resolve on their own within 10 days. Sometimes medicines are prescribed to help relieve symptoms (pain medicine, decongestants, nasal steroid sprays, or saline sprays).  However, for sinusitis related to a bacterial infection, your health care provider will prescribe antibiotic medicines. These are medicines that will help kill the bacteria causing the infection.  Rarely, sinusitis is caused by a fungal infection. In theses cases, your health care provider will prescribe antifungal medicine. For some cases of chronic sinusitis, surgery is needed. Generally, these are cases in which sinusitis recurs more than 3 times per year, despite other treatments. HOME CARE INSTRUCTIONS   Drink plenty of water. Water helps thin the mucus so your sinuses can drain more easily.  Use a humidifier.  Inhale steam 3 to 4 times a day (for example, sit in the bathroom with the shower running).  Apply a warm, moist washcloth to your face 3 to 4 times a day, or as directed by your health care provider.  Use saline nasal sprays to help moisten and clean your sinuses.  Take medicines only as directed by your health care provider.  If you were prescribed either an antibiotic or antifungal medicine, finish it all even if you start to feel better. SEEK IMMEDIATE  MEDICAL CARE IF:  You have increasing pain or severe headaches.  You have nausea, vomiting, or drowsiness.  You have swelling around your face.  You have vision problems.  You have a stiff neck.  You have difficulty breathing. MAKE SURE YOU:   Understand these instructions.  Will watch your condition.  Will get help right away if you are not doing well or get worse. Document Released: 05/13/2005 Document Revised: 09/27/2013 Document Reviewed: 05/28/2011 ExitCare Patient Information 2015 [employer]. This information is not intended to replace advice given to you by your health care provider. Make sure you discuss any questions you have with your health care provider.   

## 2015-07-14 NOTE — Progress Notes (Signed)
Pre visit review using our clinic review tool, if applicable. No additional management support is needed unless otherwise documented below in the visit note. 

## 2015-07-14 NOTE — Assessment & Plan Note (Signed)
Symptoms and exam consistent with bacterial sinusitis. Start Augmentin. Continue over-the-counter medications as needed for symptom relief and supportive care. Follow up if symptoms worsen or fail to improve. 

## 2015-07-14 NOTE — Progress Notes (Signed)
Subjective:    Patient ID: Tiffany [name], female    DOB: 02-02-1951, 65 y.o.   MRN: [id number]  Chief Complaint  Patient presents with  . Nasal Congestion    drainage, nasal congestion, sinus pressure and headache, states it has been going on a couple of months    HPI:  Tiffany [name] is a 65 y.o. female who  has a past medical history of Diabetes mellitus; Hypertension; Hyperlipidemia; Degenerative joint disease; and Sleep apnea. and presents today for an acute office visit.  This is a new problem. Associated symptoms of drainage, nasal congestion, sinus pressure and headache have been going on for approximately 2 months. Denies fevers. Modifying factors include saline nasal sprays which has not helped very much. Symptoms are constant with some worse at night. Course has stayed about the same with no improvement. No recent antibiotics.    Allergies  Allergen Reactions  . Metformin And Related     Severe nausea and vomiting  . Aspirin Nausea And Vomiting    Can take EC asa  . Contrast Media [Iodinated Diagnostic Agents] Nausea And Vomiting  . Shellfish Allergy Nausea And Vomiting  . Tramadol Nausea And Vomiting    Migraine     Current Outpatient Prescriptions on File Prior to Visit  Medication Sig Dispense Refill  . acetaminophen-codeine (TYLENOL #3) 300-30 MG tablet TAKE ONE TABLET BY MOUTH EVERY 8 HOURS AS NEEDED FOR  MODERATE  TO  SEVERE  PAIN 90 tablet 0  . amLODipine (NORVASC) 5 MG tablet Take 1 tablet (5 mg total) by mouth daily. 90 tablet 3  . Blood Glucose Monitoring Suppl (ACCU-CHEK NANO SMARTVIEW) W/DEVICE KIT Use to check blood sugar as directed up to 3 times a day. diag code 250.02. Insulin dependent 1 kit 0  . glimepiride (AMARYL) 4 MG tablet TAKE 1 TABLET (4 MG TOTAL) BY MOUTH DAILY BEFORE BREAKFAST. 30 tablet 2  . glucosamine-chondroitin (MAX GLUCOSAMINE CHONDROITIN) 500-400 MG tablet Take 1 tablet by mouth 3 (three) times daily. 90 tablet 3  . glucose blood  (AGAMATRIX PRESTO TEST) test strip Use as instructed 100 each 12  . Insulin Glargine (LANTUS SOLOSTAR) 100 UNIT/ML Solostar Pen Inject 35 Units into the skin at bedtime.     . Insulin Pen Needle 32G X 4 MM MISC Use to inject  lantus once daily 100 each 4  . LANTUS SOLOSTAR 100 UNIT/ML Solostar Pen INJECT 30 UNITS SUBCUTANEOUSLY AT BEDTIME 3 mL 5  . lidocaine (LIDODERM) 5 % Place 1 patch onto the skin daily. Remove & Discard patch within 12 hours or as directed by MD 30 patch 0  . methocarbamol (ROBAXIN) 750 MG tablet Take 1 tablet (750 mg total) by mouth every 6 (six) hours as needed for muscle spasms. 120 tablet 0  . naproxen (NAPROSYN) 500 MG tablet Take 1 tablet (500 mg total) by mouth 2 (two) times daily with a meal. 60 tablet 0  . oxyCODONE-acetaminophen (PERCOCET) 5-325 MG tablet Take 1 tablet by mouth every 8 (eight) hours as needed for severe pain. 45 tablet 0  . promethazine (PHENERGAN) 25 MG tablet Take 1 tablet (25 mg total) by mouth every 6 (six) hours as needed for nausea or vomiting. 30 tablet 0  . quinapril (ACCUPRIL) 40 MG tablet Take 1 tablet (40 mg total) by mouth daily. 90 tablet 3  . escitalopram (LEXAPRO) 10 MG tablet Take 1 tablet (10 mg total) by mouth daily. 30 tablet 1   No current facility-administered medications  on file prior to visit.    Review of Systems  Constitutional: Negative for fever and chills.  HENT: Positive for congestion and sinus pressure. Negative for sore throat.   Respiratory: Negative for cough, chest tightness and shortness of breath.   Neurological: Positive for headaches.      Objective:    BP 150/90 mmHg  Pulse 91  Temp(Src) 98.7 F (37.1 C) (Oral)  Resp 16  Ht _0  (1.6 m)  Wt 151 lb (68.493 kg)  BMI 26.76 kg/m2  SpO2 97% Nursing note and vital signs reviewed.  Physical Exam  Constitutional: She is oriented to person, place, and time. She appears well-developed and well-nourished. No distress.  HENT:  Right Ear: Hearing,  tympanic membrane, external ear and ear canal normal.  Left Ear: Hearing, tympanic membrane, external ear and ear canal normal.  Nose: Nose normal.  Mouth/Throat: Uvula is midline, oropharynx is clear and moist and mucous membranes are normal.  Cardiovascular: Normal rate, regular rhythm, normal heart sounds and intact distal pulses.   Pulmonary/Chest: Effort normal and breath sounds normal.  Neurological: She is alert and oriented to person, place, and time.  Skin: Skin is warm and dry.  Psychiatric: She has a normal mood and affect. Her behavior is normal. Judgment and thought content normal.       Assessment & Plan:   Problem List Items Addressed This Visit      Respiratory   Acute frontal sinusitis - Primary    Symptoms and exam consistent with bacterial sinusitis. Start Augmentin. Continue over-the-counter medications as needed for symptom relief and supportive care. Follow-up if symptoms worsen or fail to improve.      Relevant Medications   amoxicillin-clavulanate (AUGMENTIN) 875-125 MG tablet     Other   Sleep disturbance   Relevant Medications   zolpidem (AMBIEN) 10 MG tablet

## 2015-07-17 ENCOUNTER — Ambulatory Visit: Payer: Medicare Other | Admitting: Physical Therapy

## 2015-07-17 DIAGNOSIS — M5442 Lumbago with sciatica, left side: Secondary | ICD-10-CM

## 2015-07-17 DIAGNOSIS — M25562 Pain in left knee: Secondary | ICD-10-CM

## 2015-07-17 DIAGNOSIS — M25662 Stiffness of left knee, not elsewhere classified: Secondary | ICD-10-CM

## 2015-07-17 DIAGNOSIS — M4316 Spondylolisthesis, lumbar region: Secondary | ICD-10-CM

## 2015-07-17 DIAGNOSIS — R262 Difficulty in walking, not elsewhere classified: Secondary | ICD-10-CM

## 2015-07-17 DIAGNOSIS — M5441 Lumbago with sciatica, right side: Secondary | ICD-10-CM

## 2015-07-17 DIAGNOSIS — M76892 Other specified enthesopathies of left lower limb, excluding foot: Secondary | ICD-10-CM

## 2015-07-17 DIAGNOSIS — M25673 Stiffness of unspecified ankle, not elsewhere classified: Secondary | ICD-10-CM

## 2015-07-17 NOTE — Therapy (Signed)
[place], [place], [address] Phone: [phone]   Fax:  [phone]  Physical Therapy Treatment  Patient Details  Name: Tiffany [name] MRN: [id number] Date of Birth: [birth year] Referring Provider: Hessie Diener MD  Encounter Date: 07/17/2015      PT End of Session - 07/17/15 1425    Visit Number 6   Number of Visits 16   Date for PT Re-Evaluation 08/28/15   Authorization Type [employer] medicare   Authorization Time Period 08-28-15   PT Start Time 0217   PT Stop Time 0329   PT Time Calculation (min) 72 min      Past Medical History  Diagnosis Date  . Diabetes mellitus   . Hypertension   . Hyperlipidemia   . Degenerative joint disease   . Sleep apnea     Past Surgical History  Procedure Laterality Date  . Lumbar laminectomy  1989  . Knee arthroscopy      left x 2  . Abdominal hysterectomy  1976    There were no vitals filed for this visit.  Visit Diagnosis:  Spondylolisthesis of lumbar region  Bilateral low back pain with sciatica, sciatica laterality unspecified  Knee pain, left  Hamstring tendinitis of left thigh  Decreased ROM of left knee  Difficulty walking  Decreased ROM of ankle  Difficulty walking down stairs  Difficulty walking up stairs      Subjective Assessment - 07/17/15 1425    Subjective Back is a 7-8/10.   After treatment pt states she is better but is 8/10.  " I do think I am better,  I just dont want surgery if I can help it"   Pertinent History knee surgery on left x 2 torn ligaments and cartilage 2004?,  1989 Lumbar lamiinectomy   Limitations Standing;Walking;House hold activities;Sitting   How long can you sit comfortably? unlimited   Diagnostic tests due to have MRI on back   Patient Stated Goals get rid of knee pain  and back pain to do house work back ,. stand longer  to shopping/ trying to shop a little now   Currently in Pain? Yes   Pain Score 3     Pain Orientation Left   Pain Descriptors / Indicators Aching;Sore   Pain Type Chronic pain   Pain Score 8   Pain Location Back   Pain Orientation Left;Right;Mid   Pain Descriptors / Indicators Sharp            [facility] PT Assessment - 07/17/15 1458    Posture/Postural Control   Posture/Postural Control Postural limitations   Posture Comments Left Quadratus lumborum elevated pelvic level greater than right.                     [facility] PT Treatment/Exercise - 07/17/15 1458    Lumbar Exercises: Stretches   Single Knee to Chest Stretch 3 reps;10 seconds   Double Knee to Chest Stretch 2 reps;10 seconds   Piriformis Stretch 2 reps;10 seconds  left side only   Piriformis Stretch Limitations psoas stretch on left   prefers over soft tissue , 30 sec hold   Moist Heat Therapy   Number Minutes Moist Heat 15 Minutes   Moist Heat Location Lumbar Spine   Electrical Stimulation   Electrical Stimulation Location low back 4 electrode   Electrical Stimulation Action IFC   Electrical Stimulation Parameters to pt tolerance for 15 min   Electrical Stimulation Goals Pain  Manual Therapy   Manual Therapy Soft tissue mobilization   Soft tissue mobilization Left Quadratus Lumborum inright sidelying,  gluteals and lumbar paraspinals  concentrating on left side           Trigger Point Dry Needling - 07/17/15 1441    Consent Given? Yes   Education Handout Provided Yes   Muscles Treated Upper Body Quadratus Lumborum;Gluteus maximus;Gluteus minimus  Left Quadratus lumborum twitch response   Muscles Treated Lower Body Gluteus minimus;Gluteus maximus  left  side dry needling   Gluteus Maximus Response Twitch response elicited;Palpable increased muscle length   Gluteus Minimus Response Twitch response elicited;Palpable increased muscle length                PT Short Term Goals - 07/17/15 1503    PT SHORT TERM GOAL #1   Title "Independent with initial HEP( 07-31-15)    Time 4   Period Weeks   Status On-going   PT SHORT TERM GOAL #2   Title "Report pain decrease at rest from  8 /10 to  4 /10 with standing for greater than 15 minutes 07-31-15   Baseline Pain is 8/10    Time 4   Period Weeks   Status On-going   PT SHORT TERM GOAL #3   Title "Demonstrate and verbalize understanding of condition management including RICE, positioning, use of A.D., HEP.  07-31-15   Time 4   Period Weeks   Status On-going   PT SHORT TERM GOAL #4   Title "Demonstrate understanding of proper sitting posture and be more conscious of position and posture throughout the day. 07-31-15   Time 4   Period Weeks   Status On-going           PT Long Term Goals - 07/17/15 1536    PT LONG TERM GOAL #1   Title "Pt will be independent with advanced HEP.  08-28-15   Time 8   Period Weeks   Status On-going   PT LONG TERM GOAL #2   Title "Pain will decrease to 3/10 or less with all functional activities 08-28-15   Time 8   Period Weeks   Status On-going   PT LONG TERM GOAL #3   Title "Pt will tolerate standing and walking for 90 minutes to shop without increased pain in order to return to PLOF 08-28-15   Time 8   Period Weeks   Status On-going   PT LONG TERM GOAL #4   Title "FOTO will improve from 69% limitation    to  40% or less   indicating improved functional mobility .    Time 8   Period Weeks   Status On-going   PT LONG TERM GOAL #5   Title Pt will improved AROM in  L ankle to at least 7 degrees DF and L  knee to 130 to improve negotiating steps with pain 2/10 or less   Time 8   Period Weeks   Status On-going   PT LONG TERM GOAL #6   Title Pt will be able to negotiate steps (12)  3/10 level or less   Time 8   Period Weeks   Status On-going   PT LONG TERM GOAL #7   Title Pt will not wake more than 1 time a night while rolling over in bed due to pain   Time 8   Period Weeks   Status On-going   PT LONG TERM GOAL #8   Title Pt will be able  to perform household tasks with  proper body mechanics and posture to reduce exacerbation of pain.    Time 8   Period Weeks   Status On-going   PT LONG TERM GOAL  #9   TITLE Pt will increased gait velocity to at least 2.62 ft/sec to be at community based level with LRAD   Time 8   Period Weeks   Status On-going               Plan - 07/17/15 1459    Clinical Impression Statement Pt comes in with 8/10  and leaves about the same although she does say heat seems to help the most.  Pain today is the same after treatment to day but she has improved from the evaluation.  Pt does not tolerate stretching for longer than 10 seconds.  Pt states she knows exercises but requires VC throught out exericise.  Pt hyper reactive to palpation.  Pt was educated on importance of exericies for pain control.  Pt consented to trigger point dry needling for trigger point pain and also elevated Quadrtus on left .  pt had even pelvic levels after treatment and palpable muscle lenghthening post dry nnedling and soft tissue.  Pt was still at 8/10 after manual therapy and was put on heat and elecrtrical stimulation to alleviate pain.  No goals achieved. and pt was closely monitoted trougth out treatment.    Pt will benefit from skilled therapeutic intervention in order to improve on the following deficits Abnormal gait;Decreased activity tolerance;Decreased mobility;Decreased range of motion;Decreased strength;Pain;Postural dysfunction;Increased muscle spasms;Increased fascial restricitons;Hypomobility;Difficulty walking   Rehab Potential Good   PT Frequency 2x / week   PT Duration 8 weeks   PT Treatment/Interventions ADLs/Self Care Home Management;Cryotherapy;Electrical Stimulation;Iontophoresis 4mg /ml Dexamethasone;Moist Heat;Therapeutic exercise;Functional mobility training;Gait training;Stair training;Ultrasound;Traction;Neuromuscular re-education;Patient/family education;Manual techniques;Taping;Dry needling;Passive range of motion   PT Next Visit  Plan spndylolisthesis hand out flexion based core exericises. assess TDN, may use iontophoresis over tender points on buttocks/low back as necessary   PT Home Exercise Plan Continue reinforcing exericise for spondylolisthesis    Consulted and Agree with Plan of Care Patient        Problem List Patient Active Problem List   Diagnosis Date Noted  . Acute frontal sinusitis 07/14/2015  . Sleep disturbance 07/14/2015  . Knee pain, left 11/10/2014  . Sprain of ankle 04/11/2014  . GERD (gastroesophageal reflux disease) 03/07/2014  . Prolapse of vaginal wall with midline cystocele, grade 1 08/23/2013  . Vaginal dryness 05/14/2012  . Preventative health care 05/13/2012  . Dry mouth 05/13/2012  . Depression and insomnia 07/19/2010  . ALLERGIC RHINITIS 07/26/2008  . Multilevel degenerative disc disease of cervical and lumbar spine  01/16/2008  . Essential hypertension 01/01/2008  . Diabetes mellitus type 2, uncontrolled, without complications (no retinopathy) 03/18/2007  . HYPERLIPIDEMIA 02/13/2007  . SLEEP APNEA 02/13/2007    Voncille Lo, PT 07/17/2015 3:43 PM Phone: [phone] Fax: [facility] [place], [place], [address] Phone: [phone]   Fax:  [phone]  Name: Tiffany [name] MRN: [id number] Date of Birth: 10/01/1950

## 2015-07-17 NOTE — Patient Instructions (Signed)
Trigger Point Dry Needling  . What is Trigger Point Dry Needling (DN)? o DN is a physical therapy technique used to treat muscle pain and dysfunction. Specifically, DN helps deactivate muscle trigger points (muscle knots).  o A thin filiform needle is used to penetrate the skin and stimulate the underlying trigger point. The goal is for a local twitch response (LTR) to occur and for the trigger point to relax. No medication of any kind is injected during the procedure.   . What Does Trigger Point Dry Needling Feel Like?  o The procedure feels different for each individual patient. Some patients report that they do not actually feel the needle enter the skin and overall the process is not painful. Very mild bleeding may occur. However, many patients feel a deep cramping in the muscle in which the needle was inserted. This is the local twitch response.   Marland Kitchen How Will I feel after the treatment? o Soreness is normal, and the onset of soreness may not occur for a few hours. Typically this soreness does not last longer than two days.  o Bruising is uncommon, however; ice can be used to decrease any possible bruising.  o In rare cases feeling tired or nauseous after the treatment is normal. In addition, your symptoms may get worse before they get better, this period will typically not last longer than 24 hours.   . What Can I do After My Treatment? o Increase your hydration by drinking more water for the next 24 hours. o You may place ice or heat on the areas treated that have become sore, however, do not use heat on inflamed or bruised areas. Heat often brings more relief post needling. o You can continue your regular activities, but vigorous activity is not recommended initially after the treatment for 24 hours. o DN is best combined with other physical therapy such as strengthening, stretching, and other therapies.   Voncille Lo, PT 07/17/2015 2:34 PM Phone: [phone] Fax: [phone]

## 2015-07-20 ENCOUNTER — Ambulatory Visit: Payer: Medicare Other | Admitting: Physical Therapy

## 2015-07-20 DIAGNOSIS — M5442 Lumbago with sciatica, left side: Secondary | ICD-10-CM

## 2015-07-20 DIAGNOSIS — M25562 Pain in left knee: Secondary | ICD-10-CM | POA: Diagnosis not present

## 2015-07-20 DIAGNOSIS — M4316 Spondylolisthesis, lumbar region: Secondary | ICD-10-CM

## 2015-07-20 DIAGNOSIS — M5441 Lumbago with sciatica, right side: Secondary | ICD-10-CM

## 2015-07-20 NOTE — Therapy (Signed)
[place], [place], [address] Phone: [phone]   Fax:  [phone]  Physical Therapy Treatment  Patient Details  Name: Tiffany [name] MRN: [id number] Date of Birth: August 04, 1950 Referring Provider: Hessie Diener MD  Encounter Date: 07/20/2015      PT End of Session - 07/20/15 1656    Visit Number 7   Number of Visits 16   Date for PT Re-Evaluation 08/28/15   PT Start Time 1505   PT Stop Time 1605   PT Time Calculation (min) 60 min   Activity Tolerance Patient tolerated treatment well   Behavior During Therapy [facility] for tasks assessed/performed      Past Medical History  Diagnosis Date  . Diabetes mellitus   . Hypertension   . Hyperlipidemia   . Degenerative joint disease   . Sleep apnea     Past Surgical History  Procedure Laterality Date  . Lumbar laminectomy  1989  . Knee arthroscopy      left x 2  . Abdominal hysterectomy  1976    There were no vitals filed for this visit.  Visit Diagnosis:  Spondylolisthesis of lumbar region  Bilateral low back pain with sciatica, sciatica laterality unspecified      Subjective Assessment - 07/20/15 1509    Subjective Back is 8/10.  Dry needle seemed likre it made it worse.  My hot water bottle busted.  Low back  irritated.  Lt lateral thigh hurts , spasm to knee, especisally. at night.   Pain Location Back   Pain Orientation Left;Right;Mid   Pain Descriptors / Indicators Spasm   Pain Radiating Towards Lateral thigh to knee   Aggravating Factors  walking, bending , sitting too long   Pain Relieving Factors heat , laying down                         [facility] Adult PT Treatment/Exercise - 07/20/15 1505    Lumbar Exercises: Stretches   Passive Hamstring Stretch 3 reps;30 seconds   Pelvic Tilt 10 seconds   Lumbar Exercises: Supine   Clam 10 reps  with abdominal set   Bent Knee Raise 10 reps  arms and bent knee 10 X   Bridge  10 reps  shakey   Large Ball Abdominal Isometric 10 reps  3 sets 1 second, good abdominal work   Knee/Hip Exercises: Stretches   Piriformis Stretch 3 reps;30 seconds;Both  with assistance   Knee/Hip Exercises: Sidelying   Clams 10 X each side   Moist Heat Therapy   Moist Heat Location Lumbar Spine  extra layers.     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  07-31-15   Time 4   Period Weeks   Status On-going   PT SHORT TERM GOAL #4   Title "Demonstrate understanding of proper sitting posture and be more conscious of position and posture throughout the day. 07-31-15   Time 4   Period Weeks   Status On-going           PT Long Term Goals - 07/17/15 1536    PT LONG TERM GOAL #1   Title "Pt will be independent with advanced HEP.  08-28-15   Time 8   Period Weeks   Status On-going   PT LONG TERM GOAL #2   Title "Pain will decrease to 3/10 or less with all functional activities 08-28-15   Time 8   Period Weeks   Status On-going   PT LONG TERM GOAL #3   Title "Pt will tolerate standing and walking for 90 minutes to shop without increased pain in order to return to PLOF 08-28-15   Time 8   Period Weeks   Status On-going   PT LONG TERM GOAL #4   Title "FOTO will improve from 69% limitation    to  40% or less   indicating improved  functional mobility .    Time 8   Period Weeks   Status On-going   PT LONG TERM GOAL #5   Title Pt will improved AROM in  L ankle to at least 7 degrees DF and L  knee to 130 to improve negotiating steps with pain 2/10 or less   Time 8   Period Weeks   Status On-going   PT LONG TERM GOAL #6   Title Pt will be able to negotiate steps (12)  3/10 level or less   Time 8   Period Weeks   Status On-going   PT LONG TERM GOAL #7   Title Pt will not wake more than 1 time a night while rolling over in bed due to pain   Time 8   Period Weeks   Status On-going   PT LONG TERM GOAL #8   Title Pt will be able to perform household tasks with proper body mechanics and posture to reduce exacerbation of pain.    Time 8   Period Weeks   Status On-going   PT LONG TERM GOAL  #9   TITLE Pt will increased gait velocity to at least 2.62 ft/sec to be at community based level with LRAD   Time 8   Period Weeks   Status On-going               Plan - 07/20/15 1656    Clinical Impression Statement Exercise focus today.  Moist heat with extra layers used during exercises .  Patient especially liked the abdominal work with ball isometrics.  She plans on looking for a beach ball for these.  Dry needle did not help.  It made her more sore.     PT Next Visit Plan spndylolisthesis hand out flexion based core exericises., may use iontophoresis over tender points on buttocks/low back as necessary  abdominal isometric with ball for home.   PT Home Exercise Plan Continue reinforcing exericise for spondylolisthesis    Consulted and Agree with Plan of Care Patient        Problem List Patient Active Problem List   Diagnosis Date Noted  . Acute frontal sinusitis 07/14/2015  . Sleep disturbance 07/14/2015  .  Knee pain, left 11/10/2014  . Sprain of ankle 04/11/2014  . GERD (gastroesophageal reflux disease) 03/07/2014  . Prolapse of vaginal wall with midline cystocele, grade 1 08/23/2013  . Vaginal dryness  05/14/2012  . Preventative health care 05/13/2012  . Dry mouth 05/13/2012  . Depression and insomnia 07/19/2010  . ALLERGIC RHINITIS 07/26/2008  . Multilevel degenerative disc disease of cervical and lumbar spine  01/16/2008  . Essential hypertension 01/01/2008  . Diabetes mellitus type 2, uncontrolled, without complications (no retinopathy) 03/18/2007  . HYPERLIPIDEMIA 02/13/2007  . SLEEP APNEA 02/13/2007    Kyri Shader 07/20/2015, 5:02 PM  [facility] [address] [place], [place], [address] Phone: [phone]   Fax:  [phone]  Name: ANIYIA RHODD MRN: [id number] Date of Birth: 1950-09-01    Melvenia Needles, PTA 07/20/2015 5:02 PM Phone: [phone] Fax: [phone]

## 2015-07-28 ENCOUNTER — Telehealth: Payer: Self-pay | Admitting: Family

## 2015-07-28 MED ORDER — DOXYCYCLINE HYCLATE 100 MG PO TABS: 100.0000 mg | ORAL_TABLET | Freq: Two times a day (BID) | ORAL | Status: DC

## 2015-07-28 NOTE — Telephone Encounter (Signed)
Pt aware.

## 2015-07-28 NOTE — Telephone Encounter (Signed)
Pt called stated she is allergic to Penicillin therefore she will not be able to take amoxicillin-clavulanate (AUGMENTIN) 875-125 MG tablet. Can we send in something else to [facility]. Please call pt once this is done, she said this was suppose to be done 3 days ago.

## 2015-07-28 NOTE — Telephone Encounter (Signed)
Doxycycline sent to pharmacy

## 2015-07-31 DIAGNOSIS — M4316 Spondylolisthesis, lumbar region: Secondary | ICD-10-CM | POA: Diagnosis not present

## 2015-08-03 ENCOUNTER — Ambulatory Visit: Payer: Medicare Other | Attending: Orthopedic Surgery

## 2015-08-03 ENCOUNTER — Other Ambulatory Visit: Payer: Self-pay | Admitting: Family

## 2015-08-03 DIAGNOSIS — M5442 Lumbago with sciatica, left side: Secondary | ICD-10-CM | POA: Diagnosis not present

## 2015-08-03 DIAGNOSIS — M5441 Lumbago with sciatica, right side: Secondary | ICD-10-CM | POA: Diagnosis not present

## 2015-08-03 DIAGNOSIS — R262 Difficulty in walking, not elsewhere classified: Secondary | ICD-10-CM | POA: Insufficient documentation

## 2015-08-03 DIAGNOSIS — E1165 Type 2 diabetes mellitus with hyperglycemia: Secondary | ICD-10-CM | POA: Diagnosis not present

## 2015-08-03 DIAGNOSIS — M65852 Other synovitis and tenosynovitis, left thigh: Secondary | ICD-10-CM | POA: Diagnosis not present

## 2015-08-03 DIAGNOSIS — M25662 Stiffness of left knee, not elsewhere classified: Secondary | ICD-10-CM

## 2015-08-03 DIAGNOSIS — R29898 Other symptoms and signs involving the musculoskeletal system: Secondary | ICD-10-CM | POA: Diagnosis not present

## 2015-08-03 DIAGNOSIS — M4316 Spondylolisthesis, lumbar region: Secondary | ICD-10-CM | POA: Diagnosis not present

## 2015-08-03 DIAGNOSIS — M76892 Other specified enthesopathies of left lower limb, excluding foot: Secondary | ICD-10-CM

## 2015-08-03 DIAGNOSIS — M25562 Pain in left knee: Secondary | ICD-10-CM | POA: Diagnosis not present

## 2015-08-03 NOTE — Therapy (Addendum)
[place], [place], [address] Phone: [phone]   Fax:  [phone]  Physical Therapy Treatment  Patient Details  Name: Tiffany [name] MRN: [id number] Date of Birth: July 20, 1950 Referring Provider: Hessie Diener MD  Encounter Date: 08/03/2015      PT End of Session - 08/03/15 1528    Visit Number 8   Number of Visits 16   PT Start Time 0300   PT Stop Time 0325   PT Time Calculation (min) 25 min   Activity Tolerance Patient tolerated treatment well   Behavior During Therapy [facility] for tasks assessed/performed      Past Medical History  Diagnosis Date  . Diabetes mellitus   . Hypertension   . Hyperlipidemia   . Degenerative joint disease   . Sleep apnea     Past Surgical History  Procedure Laterality Date  . Lumbar laminectomy  1989  . Knee arthroscopy      left x 2  . Abdominal hysterectomy  1976    There were no vitals filed for this visit.  Visit Diagnosis:  Spondylolisthesis of lumbar region  Bilateral low back pain with sciatica, sciatica laterality unspecified  Knee pain, left  Hamstring tendinitis of left thigh  Decreased ROM of left knee  Difficulty walking      Subjective Assessment - 08/03/15 1502    Subjective About the same. Saw Md monday and probably going to have therapy another week. She will have back surgery when I am ready.    Pain Score 7   Pain Location Back   Pain Orientation Left;Right;Lower   Pain Descriptors / Indicators Spasm;Sharp;Nagging  pulling   Pain Type Chronic pain   Pain Onset More than a month ago   Pain Frequency Constant   Aggravating Factors  walk , bending , sitting                                   PT Short Term Goals - 08/03/15 1507    PT SHORT TERM GOAL #1   Title "Independent with initial HEP( 07-31-15)   Status Achieved   PT SHORT TERM GOAL #2   Title "Report pain decrease at rest from  8 /10 to  4  /10 with standing for greater than 15 minutes 07-31-15   Baseline Pain level stays from 7/10 or higher   Status Not Met   PT SHORT TERM GOAL #3   Title "Demonstrate and verbalize understanding of condition management including RICE, positioning, use of A.D., HEP.  07-31-15   Baseline She will continue to exercisess and use heat or ice. We discussed going on line for TENS as Medicare does not pay for TENS for back pain   Status Achieved   PT SHORT TERM GOAL #4   Title "Demonstrate understanding of proper sitting posture and be more conscious of position and posture throughout the day. 07-31-15   Status Achieved           PT Long Term Goals - 08/03/15 1510    PT LONG TERM GOAL #1   Title "Pt will be independent with advanced HEP.  08-28-15   Status Not Met   PT LONG TERM GOAL #2   Title "Pain will decrease to 3/10 or less with all functional activities 08-28-15   Status Not Met   PT LONG TERM GOAL #3   Title "Pt will tolerate standing  and walking for 90 minutes to shop without increased pain in order to return to PLOF 08-28-15   Status Not Met   PT LONG TERM GOAL #4   Title "FOTO will improve from 69% limitation    to  40% or less   indicating improved functional mobility .    Baseline 43% on FOTO   Status Not Met   PT LONG TERM GOAL #5   Baseline 4 degrees DF     110 degrees Lt knee flexion   Status Not Met   PT LONG TERM GOAL #6   Title Pt will be able to negotiate steps (12)  3/10 level or less   Status Not Met   PT LONG TERM GOAL #7   Title Pt will not wake more than 1 time a night while rolling over in bed due to pain   Baseline varies   Status Not Met   PT LONG TERM GOAL #8   Title Pt will be able to perform household tasks with proper body mechanics and posture to reduce exacerbation of pain.    Status Not Met   PT LONG TERM GOAL  #9   TITLE Pt will increased gait velocity to at least 2.62 ft/sec to be at community based level with LRAD   Status Unable to assess                Plan - 2015-08-22 1528    Clinical Impression Statement indicated she will probably have surgery in near future  and PT has not changed pain significantly though she liked the heat and electric stim and she reports she is doing her HEP. We discussed lack of progress and cost of therapy. I usde a model spine and we discussed fusion and we also discussed posture and how she is more comfortable in flexion due to spondylolisthesis. She afreed to discharge due to lack of progress , cost and probability of fusion in near future   PT Next Visit Plan [place] with HEP   Consulted and Agree with Plan of Care Patient          G-Codes - 22-Aug-2015 1527    Functional Assessment Tool Used FOTO43%   Functional Limitation Mobility: Walking and moving around   Mobility: Walking and Moving Around Goal Status [phone]) At least 20 percent but less than 40 percent impaired, limited or restricted   Mobility: Walking and Moving Around Discharge Status [phone]) At least 40 percent but less than 60 percent impaired, limited or restricted      Problem List Patient Active Problem List   Diagnosis Date Noted  . Acute frontal sinusitis 07/14/2015  . Sleep disturbance 07/14/2015  . Knee pain, left 11/10/2014  . Sprain of ankle 04/11/2014  . GERD (gastroesophageal reflux disease) 03/07/2014  . Prolapse of vaginal wall with midline cystocele, grade 1 08/23/2013  . Vaginal dryness 05/14/2012  . Preventative health care 05/13/2012  . Dry mouth 05/13/2012  . Depression and insomnia 07/19/2010  . ALLERGIC RHINITIS 07/26/2008  . Multilevel degenerative disc disease of cervical and lumbar spine  01/16/2008  . Essential hypertension 01/01/2008  . Diabetes mellitus type 2, uncontrolled, without complications (no retinopathy) 03/18/2007  . HYPERLIPIDEMIA 02/13/2007  . SLEEP APNEA 02/13/2007    Darrel Hoover PT 08/22/15, 3:35 PM  [facility] [address] [place], [place], [address] Phone: [phone]   Fax:  [phone]  Name: Tiffany [name] MRN: [id number] Date of Birth: 07/17/1950  PHYSICAL THERAPY DISCHARGE SUMMARY  Visits from Start of Care: 8  Current functional level related to goals / functional outcomes: See above   Remaining deficits: She continues with high pain levels   Education / Equipment: HEP  Plan: Patient agrees to discharge.  Patient goals were not met. Patient is being discharged due to lack of progress.  ?????

## 2015-08-14 ENCOUNTER — Encounter: Payer: Medicare Other | Admitting: Physical Therapy

## 2015-08-17 ENCOUNTER — Encounter: Payer: Medicare Other | Admitting: Physical Therapy

## 2015-08-21 ENCOUNTER — Encounter: Payer: Medicare Other | Admitting: Physical Therapy

## 2015-08-24 ENCOUNTER — Encounter: Payer: Medicare Other | Admitting: Physical Therapy

## 2015-09-11 DIAGNOSIS — M4316 Spondylolisthesis, lumbar region: Secondary | ICD-10-CM | POA: Diagnosis not present

## 2015-09-11 DIAGNOSIS — Z6826 Body mass index (BMI) 26.0-26.9, adult: Secondary | ICD-10-CM | POA: Diagnosis not present

## 2015-09-11 DIAGNOSIS — I10 Essential (primary) hypertension: Secondary | ICD-10-CM | POA: Diagnosis not present

## 2015-09-11 DIAGNOSIS — M545 Low back pain: Secondary | ICD-10-CM | POA: Diagnosis not present

## 2015-09-22 ENCOUNTER — Other Ambulatory Visit: Payer: Self-pay | Admitting: Neurosurgery

## 2015-10-02 ENCOUNTER — Encounter (HOSPITAL_COMMUNITY): Payer: Self-pay

## 2015-10-02 ENCOUNTER — Encounter (HOSPITAL_COMMUNITY)
Admission: RE | Admit: 2015-10-02 | Discharge: 2015-10-02 | Disposition: A | Discharge status: Home / Self Care | Payer: Medicare Other | Source: Ambulatory Visit | Attending: Neurosurgery | Admitting: Neurosurgery

## 2015-10-02 DIAGNOSIS — Z794 Long term (current) use of insulin: Secondary | ICD-10-CM | POA: Insufficient documentation

## 2015-10-02 DIAGNOSIS — Z01812 Encounter for preprocedural laboratory examination: Secondary | ICD-10-CM | POA: Insufficient documentation

## 2015-10-02 DIAGNOSIS — G4733 Obstructive sleep apnea (adult) (pediatric): Secondary | ICD-10-CM | POA: Insufficient documentation

## 2015-10-02 DIAGNOSIS — Z01818 Encounter for other preprocedural examination: Secondary | ICD-10-CM | POA: Insufficient documentation

## 2015-10-02 DIAGNOSIS — Z87891 Personal history of nicotine dependence: Secondary | ICD-10-CM | POA: Insufficient documentation

## 2015-10-02 DIAGNOSIS — M4310 Spondylolisthesis, site unspecified: Secondary | ICD-10-CM | POA: Diagnosis not present

## 2015-10-02 DIAGNOSIS — E119 Type 2 diabetes mellitus without complications: Secondary | ICD-10-CM | POA: Insufficient documentation

## 2015-10-02 DIAGNOSIS — I10 Essential (primary) hypertension: Secondary | ICD-10-CM | POA: Diagnosis not present

## 2015-10-02 DIAGNOSIS — R9431 Abnormal electrocardiogram [ECG] [EKG]: Secondary | ICD-10-CM | POA: Diagnosis not present

## 2015-10-02 DIAGNOSIS — Z0183 Encounter for blood typing: Secondary | ICD-10-CM | POA: Diagnosis not present

## 2015-10-02 DIAGNOSIS — Z79899 Other long term (current) drug therapy: Secondary | ICD-10-CM | POA: Insufficient documentation

## 2015-10-02 DIAGNOSIS — E785 Hyperlipidemia, unspecified: Secondary | ICD-10-CM | POA: Insufficient documentation

## 2015-10-02 HISTORY — DX: Headache, unspecified: R51.9

## 2015-10-02 HISTORY — DX: Headache: R51

## 2015-10-02 LAB — BASIC METABOLIC PANEL
Anion gap: 11 (ref 5–15)
BUN: 13 mg/dL (ref 6–20)
CHLORIDE: 103 mmol/L (ref 101–111)
CO2: 21 mmol/L — AB (ref 22–32)
CREATININE: 0.68 mg/dL (ref 0.44–1.00)
Calcium: 8.9 mg/dL (ref 8.9–10.3)
GFR calc Af Amer: >60 mL/min (ref >60)
GFR calc non Af Amer: >60 mL/min (ref >60)
GLUCOSE: 259 mg/dL — AB (ref 65–99)
POTASSIUM: 4 mmol/L (ref 3.5–5.1)
Sodium: 135 mmol/L (ref 135–145)

## 2015-10-02 LAB — TYPE AND SCREEN
ABO/RH(D): A POS
ANTIBODY SCREEN: NEGATIVE

## 2015-10-02 LAB — CBC
HCT: 40.2 % (ref 36.0–46.0)
HEMOGLOBIN: 13.4 g/dL (ref 12.0–15.0)
MCH: 28.6 pg (ref 26.0–34.0)
MCHC: 33.3 g/dL (ref 30.0–36.0)
MCV: 85.7 fL (ref 78.0–100.0)
Platelets: 214 10*3/uL (ref 150–400)
RBC: 4.69 MIL/uL (ref 3.87–5.11)
RDW: 13 % (ref 11.5–15.5)
WBC: 3 10*3/uL — ABNORMAL LOW (ref 4.0–10.5)

## 2015-10-02 LAB — SURGICAL PCR SCREEN
MRSA, PCR: NEGATIVE
Staphylococcus aureus: NEGATIVE

## 2015-10-02 LAB — ABO/RH: ABO/RH(D): A POS

## 2015-10-02 LAB — GLUCOSE, CAPILLARY: GLUCOSE-CAPILLARY: 262 mg/dL — AB (ref 65–99)

## 2015-10-02 NOTE — Pre-Procedure Instructions (Signed)
Tiffany [name]  10/02/2015      [id number] [address] [place] [address] Phone: [phone] Fax: [phone]  [id number] [place], [place] AT [address] [place]. [place] [address] Phone: [phone] Fax: [phone]  [place] [place] [place] [address] Phone: [phone] Fax: [phone]    Your procedure is scheduled on 10/06/15.  Report to [facility] Admitting at 1030 A.M.  Call this number if you have problems the morning of surgery:  [phone]   Remember:  Do not eat food or drink liquids after midnight.  Take these medicines the morning of surgery with A SIP OF WATER --tylenol,norvasc,oxycodone,   Do not wear jewelry, make-up or nail polish.  Do not wear lotions, powders, or perfumes.  You may wear deodorant.  Do not shave 48 hours prior to surgery.  Men may shave face and neck.  Do not bring valuables to the hospital.  [facility] is not responsible for any belongings or valuables.  Contacts, dentures or bridgework may not be worn into surgery.  Leave your suitcase in the car.  After surgery it may be brought to your room.  For patients admitted to the hospital, discharge time will be determined by your treatment team.  Patients discharged the day of surgery will not be allowed to drive home.   Name and phone number of your driver:   Special instructions:   Please read over the following fact sheets that you were given. Pain Booklet, Coughing and Deep Breathing, Blood Transfusion Information, MRSA Information and Surgical Site Infection Prevention   How to Manage Your Diabetes Before and After Surgery  Why is it important to control my blood sugar before and after surgery? . Improving blood sugar levels before and after surgery helps healing  and can limit problems. . A way of improving blood sugar control is eating a healthy diet by: o  Eating less sugar and carbohydrates o  Increasing activity/exercise o  Talking with your doctor about reaching your blood sugar goals . High blood sugars (greater than 180 mg/dL) can raise your risk of infections and slow your recovery, so you will need to focus on controlling your diabetes during the weeks before surgery. . Make sure that the doctor who takes care of your diabetes knows about your planned surgery including the date and location.  How do I manage my blood sugar before surgery? . Check your blood sugar at least 4 times a day, starting 2 days before surgery, to make sure that the level is not too high or low. o Check your blood sugar the morning of your surgery when you wake up and every 2 hours until you get to the Short Stay unit. . If your blood sugar is less than 70 mg/dL, you will need to treat for low blood sugar: o Do not take insulin. o Treat a low blood sugar (less than 70 mg/dL) with  cup of clear juice (cranberry or apple), 4 glucose tablets, OR glucose gel. o Recheck blood sugar in 15 minutes after treatment (to make sure it is greater than 70 mg/dL). If your blood sugar is not greater than 70 mg/dL on recheck, call [phone] for further instructions. . Report your blood sugar to the short stay nurse when you get to Short Stay.  Marland Kitchen  If you are admitted to the hospital after surgery: o Your blood sugar will be checked by the staff and you will probably be given insulin after surgery (instead of oral diabetes medicines) to make sure you have good blood sugar levels. o The goal for blood sugar control after surgery is 80-180 mg/dL.              WHAT DO I DO ABOUT MY DIABETES MEDICATION?   Marland Kitchen Do not take oral diabetes medicines (pills) the morning of surgery.  . THE NIGHT BEFORE SURGERY, take _______17____ units of ________lantus___insulin.       Marland Kitchen HE  MORNING OF SURGERY  . The day of surgery, do not take other diabetes injectables, including Byetta (exenatide), Bydureon (exenatide ER), Victoza (liraglutide), or Trulicity (dulaglutide).  . If your CBG is greater than 220 mg/dL, you may take  of your sliding scale (correction) dose of insulin.  Other Instructions:          Patient Signature:  Date:   Nurse Signature:  Date:   Reviewed and Endorsed by [facility] Patient Education Committee, August 2015

## 2015-10-03 LAB — HEMOGLOBIN A1C
HEMOGLOBIN A1C: 10.4 % — AB (ref 4.8–5.6)
MEAN PLASMA GLUCOSE: 252 mg/dL

## 2015-10-04 ENCOUNTER — Telehealth: Payer: Self-pay

## 2015-10-04 NOTE — Telephone Encounter (Signed)
Lorriane Shire with [facility] called:  [phone] (fasting).   Will call pt directly to schedule.  LVM for pt to call back as soon as possible.   RE: Pt to have an appt regarding increase fasting blood sugar. Needs an appt asap.

## 2015-10-04 NOTE — Progress Notes (Signed)
Anesthesia Chart Review:  Pt is a 65 year old female scheduled for L4-5 PLIF on 10/06/2015 with Dr. Kathyrn Sheriff.   PCP is Mauricio Po, FNP  PMH includes:  HTN, DM, hyperlipidemia, OSA. Former smoker. BMI 27  Medications include: amlodipine, glimepiride, lantus, quinapril, zantac  Preoperative labs reviewed.  HgbA1c 10.4, glucose 259. I spoke with pt by telephone. She does not regularly check her glucose levels at home. She did check a fasting sugar this morning and it was 252. She reports consuming soda frequently. Pt is aware that surgery could be cancelled if blood glucose is over 200 DOS.   EKG 10/02/15: NSR. LAD.   Notifed Vanessa in Dr. Cleotilde Neer office of pt's uncontrolled DM.   Willeen Cass, FNP-BC [facility]/Anesthesiology Phone: [phone] 10/04/2015 12:02 PM

## 2015-10-05 ENCOUNTER — Telehealth: Payer: Self-pay | Admitting: Family

## 2015-10-05 MED ORDER — VANCOMYCIN HCL IN DEXTROSE 1-5 GM/200ML-% IV SOLN
1000.0000 mg | INTRAVENOUS | Status: AC
  Administered 2015-10-06: 1000 mg via INTRAVENOUS
  Filled 2015-10-05: qty 200

## 2015-10-06 ENCOUNTER — Inpatient Hospital Stay (HOSPITAL_COMMUNITY): Payer: Medicare Other

## 2015-10-06 ENCOUNTER — Inpatient Hospital Stay (HOSPITAL_COMMUNITY): Payer: Medicare Other | Admitting: Emergency Medicine

## 2015-10-06 ENCOUNTER — Inpatient Hospital Stay (HOSPITAL_COMMUNITY): Payer: Medicare Other | Admitting: Anesthesiology

## 2015-10-06 ENCOUNTER — Encounter (HOSPITAL_COMMUNITY): Payer: Self-pay | Admitting: *Deleted

## 2015-10-06 ENCOUNTER — Inpatient Hospital Stay (HOSPITAL_COMMUNITY)
Admission: AD | Admit: 2015-10-06 | Discharge: 2015-10-16 | DRG: 460 | Disposition: A | Discharge status: Home Health | Payer: Medicare Other | Source: Ambulatory Visit | Attending: Neurosurgery | Admitting: Neurosurgery

## 2015-10-06 ENCOUNTER — Encounter (HOSPITAL_COMMUNITY)
Admission: AD | Disposition: A | Discharge status: Home Health | Payer: Self-pay | Source: Ambulatory Visit | Attending: Neurosurgery

## 2015-10-06 DIAGNOSIS — M179 Osteoarthritis of knee, unspecified: Secondary | ICD-10-CM | POA: Diagnosis not present

## 2015-10-06 DIAGNOSIS — G9741 Accidental puncture or laceration of dura during a procedure: Secondary | ICD-10-CM | POA: Diagnosis not present

## 2015-10-06 DIAGNOSIS — M4316 Spondylolisthesis, lumbar region: Secondary | ICD-10-CM | POA: Diagnosis not present

## 2015-10-06 DIAGNOSIS — E785 Hyperlipidemia, unspecified: Secondary | ICD-10-CM | POA: Diagnosis present

## 2015-10-06 DIAGNOSIS — Z794 Long term (current) use of insulin: Secondary | ICD-10-CM

## 2015-10-06 DIAGNOSIS — Z886 Allergy status to analgesic agent status: Secondary | ICD-10-CM | POA: Diagnosis not present

## 2015-10-06 DIAGNOSIS — Z87891 Personal history of nicotine dependence: Secondary | ICD-10-CM

## 2015-10-06 DIAGNOSIS — M545 Low back pain: Secondary | ICD-10-CM | POA: Diagnosis present

## 2015-10-06 DIAGNOSIS — K219 Gastro-esophageal reflux disease without esophagitis: Secondary | ICD-10-CM | POA: Diagnosis not present

## 2015-10-06 DIAGNOSIS — Z419 Encounter for procedure for purposes other than remedying health state, unspecified: Secondary | ICD-10-CM

## 2015-10-06 DIAGNOSIS — E118 Type 2 diabetes mellitus with unspecified complications: Secondary | ICD-10-CM | POA: Diagnosis not present

## 2015-10-06 DIAGNOSIS — Z888 Allergy status to other drugs, medicaments and biological substances status: Secondary | ICD-10-CM | POA: Diagnosis not present

## 2015-10-06 DIAGNOSIS — Z91041 Radiographic dye allergy status: Secondary | ICD-10-CM | POA: Diagnosis not present

## 2015-10-06 DIAGNOSIS — G4730 Sleep apnea, unspecified: Secondary | ICD-10-CM | POA: Diagnosis present

## 2015-10-06 DIAGNOSIS — I10 Essential (primary) hypertension: Secondary | ICD-10-CM | POA: Diagnosis not present

## 2015-10-06 DIAGNOSIS — Z91013 Allergy to seafood: Secondary | ICD-10-CM

## 2015-10-06 DIAGNOSIS — M4326 Fusion of spine, lumbar region: Secondary | ICD-10-CM | POA: Diagnosis not present

## 2015-10-06 LAB — CREATININE, SERUM
Creatinine, Ser: 0.63 mg/dL (ref 0.44–1.00)
GFR calc non Af Amer: >60 mL/min (ref >60)

## 2015-10-06 LAB — GLUCOSE, CAPILLARY
GLUCOSE-CAPILLARY: 175 mg/dL — AB (ref 65–99)
GLUCOSE-CAPILLARY: 224 mg/dL — AB (ref 65–99)
Glucose-Capillary: 185 mg/dL — ABNORMAL HIGH (ref 65–99)

## 2015-10-06 LAB — CBC
HEMATOCRIT: 32 % — AB (ref 36.0–46.0)
HEMOGLOBIN: 10.3 g/dL — AB (ref 12.0–15.0)
MCH: 27.7 pg (ref 26.0–34.0)
MCHC: 32.2 g/dL (ref 30.0–36.0)
MCV: 86 fL (ref 78.0–100.0)
Platelets: 164 10*3/uL (ref 150–400)
RBC: 3.72 MIL/uL — AB (ref 3.87–5.11)
RDW: 13.2 % (ref 11.5–15.5)
WBC: 7.3 10*3/uL (ref 4.0–10.5)

## 2015-10-06 LAB — TYPE AND SCREEN
ABO/RH(D): A POS
ANTIBODY SCREEN: NEGATIVE

## 2015-10-06 SURGERY — POSTERIOR LUMBAR FUSION 1 LEVEL
Anesthesia: General | Site: Spine Lumbar

## 2015-10-06 MED ORDER — OXYCODONE-ACETAMINOPHEN 5-325 MG PO TABS
ORAL_TABLET | ORAL | Status: AC
  Filled 2015-10-06: qty 2

## 2015-10-06 MED ORDER — FAMOTIDINE 20 MG PO TABS
20.0000 mg | ORAL_TABLET | Freq: Two times a day (BID) | ORAL | Status: DC
  Administered 2015-10-07 – 2015-10-16 (×20): 20 mg via ORAL
  Filled 2015-10-06 (×20): qty 1

## 2015-10-06 MED ORDER — SENNA 8.6 MG PO TABS
1.0000 | ORAL_TABLET | Freq: Two times a day (BID) | ORAL | Status: DC
  Administered 2015-10-07 – 2015-10-16 (×20): 8.6 mg via ORAL
  Filled 2015-10-06 (×20): qty 1

## 2015-10-06 MED ORDER — DEXTROSE 5 % IV SOLN
INTRAVENOUS | Status: DC | PRN
  Administered 2015-10-06: 14:00:00 via INTRAVENOUS

## 2015-10-06 MED ORDER — ACETAMINOPHEN 650 MG RE SUPP: 650.0000 mg | RECTAL | Status: DC | PRN

## 2015-10-06 MED ORDER — VANCOMYCIN HCL IN DEXTROSE 1-5 GM/200ML-% IV SOLN
1000.0000 mg | Freq: Once | INTRAVENOUS | Status: AC
  Administered 2015-10-07: 1000 mg via INTRAVENOUS
  Filled 2015-10-06: qty 200

## 2015-10-06 MED ORDER — VECURONIUM BROMIDE 10 MG IV SOLR
INTRAVENOUS | Status: DC | PRN
  Administered 2015-10-06: 2 mg via INTRAVENOUS
  Administered 2015-10-06: 3 mg via INTRAVENOUS

## 2015-10-06 MED ORDER — ONDANSETRON HCL 4 MG/2ML IJ SOLN
4.0000 mg | Freq: Once | INTRAMUSCULAR | Status: AC | PRN
  Administered 2015-10-06: 4 mg via INTRAVENOUS

## 2015-10-06 MED ORDER — ROCURONIUM BROMIDE 100 MG/10ML IV SOLN
INTRAVENOUS | Status: DC | PRN
  Administered 2015-10-06: 10 mg via INTRAVENOUS
  Administered 2015-10-06: 40 mg via INTRAVENOUS

## 2015-10-06 MED ORDER — ACETAMINOPHEN 325 MG PO TABS
650.0000 mg | ORAL_TABLET | ORAL | Status: DC | PRN
  Administered 2015-10-07 – 2015-10-14 (×4): 650 mg via ORAL
  Filled 2015-10-06 (×4): qty 2

## 2015-10-06 MED ORDER — ONDANSETRON HCL 4 MG/2ML IJ SOLN
INTRAMUSCULAR | Status: DC | PRN
  Administered 2015-10-06: 4 mg via INTRAVENOUS

## 2015-10-06 MED ORDER — SODIUM CHLORIDE 0.9% FLUSH
3.0000 mL | Freq: Two times a day (BID) | INTRAVENOUS | Status: DC
  Administered 2015-10-07 – 2015-10-15 (×8): 3 mL via INTRAVENOUS

## 2015-10-06 MED ORDER — LIDOCAINE 2% (20 MG/ML) 5 ML SYRINGE
INTRAMUSCULAR | Status: AC
  Filled 2015-10-06: qty 5

## 2015-10-06 MED ORDER — GLUCOSAMINE-CHONDROITIN 500-400 MG PO TABS: 1.0000 | ORAL_TABLET | Freq: Three times a day (TID) | ORAL | Status: DC

## 2015-10-06 MED ORDER — SODIUM CHLORIDE 0.9 % IV SOLN
INTRAVENOUS | Status: DC
  Administered 2015-10-06 – 2015-10-08 (×3): via INTRAVENOUS

## 2015-10-06 MED ORDER — ARTIFICIAL TEARS OP OINT
TOPICAL_OINTMENT | OPHTHALMIC | Status: AC
  Filled 2015-10-06: qty 3.5

## 2015-10-06 MED ORDER — ARTIFICIAL TEARS OP OINT
TOPICAL_OINTMENT | OPHTHALMIC | Status: DC | PRN
Start: 2015-10-06 — End: 2015-10-06
  Administered 2015-10-06: 1 via OPHTHALMIC

## 2015-10-06 MED ORDER — SUGAMMADEX SODIUM 200 MG/2ML IV SOLN
INTRAVENOUS | Status: AC
Start: 2015-10-06 — End: 2015-10-06
  Filled 2015-10-06: qty 2

## 2015-10-06 MED ORDER — FLEET ENEMA 7-19 GM/118ML RE ENEM: 1.0000 | ENEMA | Freq: Once | RECTAL | Status: DC | PRN

## 2015-10-06 MED ORDER — OXYCODONE-ACETAMINOPHEN 5-325 MG PO TABS
1.0000 | ORAL_TABLET | ORAL | Status: DC | PRN
  Administered 2015-10-06 – 2015-10-11 (×13): 2 via ORAL
  Filled 2015-10-06 (×12): qty 2

## 2015-10-06 MED ORDER — MENTHOL 3 MG MT LOZG: 1.0000 | LOZENGE | OROMUCOSAL | Status: DC | PRN

## 2015-10-06 MED ORDER — HYDROMORPHONE HCL 1 MG/ML IJ SOLN
0.5000 mg | INTRAMUSCULAR | Status: AC | PRN
  Administered 2015-10-06 (×4): 0.5 mg via INTRAVENOUS

## 2015-10-06 MED ORDER — LISINOPRIL 20 MG PO TABS
40.0000 mg | ORAL_TABLET | Freq: Every day | ORAL | Status: DC
  Administered 2015-10-07 – 2015-10-16 (×7): 40 mg via ORAL
  Filled 2015-10-06: qty 2
  Filled 2015-10-06: qty 1
  Filled 2015-10-06 (×3): qty 2
  Filled 2015-10-06: qty 1
  Filled 2015-10-06: qty 2
  Filled 2015-10-06: qty 1
  Filled 2015-10-06 (×2): qty 2
  Filled 2015-10-06: qty 1
  Filled 2015-10-06: qty 2

## 2015-10-06 MED ORDER — ONDANSETRON HCL 4 MG/2ML IJ SOLN
4.0000 mg | INTRAMUSCULAR | Status: DC | PRN
  Administered 2015-10-07 – 2015-10-09 (×3): 4 mg via INTRAVENOUS
  Filled 2015-10-06 (×3): qty 2

## 2015-10-06 MED ORDER — MIDAZOLAM HCL 2 MG/2ML IJ SOLN
INTRAMUSCULAR | Status: AC
  Filled 2015-10-06: qty 2

## 2015-10-06 MED ORDER — PROPOFOL 10 MG/ML IV BOLUS
INTRAVENOUS | Status: DC | PRN
  Administered 2015-10-06: 100 mg via INTRAVENOUS

## 2015-10-06 MED ORDER — INSULIN ASPART 100 UNIT/ML ~~LOC~~ SOLN
0.0000 [IU] | Freq: Three times a day (TID) | SUBCUTANEOUS | Status: DC
  Administered 2015-10-07: 3 [IU] via SUBCUTANEOUS
  Administered 2015-10-08: 2 [IU] via SUBCUTANEOUS
  Administered 2015-10-08: 3 [IU] via SUBCUTANEOUS
  Administered 2015-10-08 – 2015-10-09 (×2): 5 [IU] via SUBCUTANEOUS
  Administered 2015-10-09: 2 [IU] via SUBCUTANEOUS
  Administered 2015-10-10: 3 [IU] via SUBCUTANEOUS
  Administered 2015-10-11: 2 [IU] via SUBCUTANEOUS
  Administered 2015-10-11: 3 [IU] via SUBCUTANEOUS
  Administered 2015-10-12 (×2): 2 [IU] via SUBCUTANEOUS
  Administered 2015-10-12: 5 [IU] via SUBCUTANEOUS
  Administered 2015-10-13: 2 [IU] via SUBCUTANEOUS
  Administered 2015-10-13 (×2): 3 [IU] via SUBCUTANEOUS
  Administered 2015-10-14: 5 [IU] via SUBCUTANEOUS
  Administered 2015-10-14: 3 [IU] via SUBCUTANEOUS
  Administered 2015-10-15: 2 [IU] via SUBCUTANEOUS
  Administered 2015-10-15: 5 [IU] via SUBCUTANEOUS
  Administered 2015-10-15: 11 [IU] via SUBCUTANEOUS
  Administered 2015-10-16: 8 [IU] via SUBCUTANEOUS
  Administered 2015-10-16: 3 [IU] via SUBCUTANEOUS

## 2015-10-06 MED ORDER — LIDOCAINE HCL (CARDIAC) 20 MG/ML IV SOLN
INTRAVENOUS | Status: DC | PRN
  Administered 2015-10-06: 50 mg via INTRAVENOUS
  Administered 2015-10-06: 100 mg via INTRAVENOUS

## 2015-10-06 MED ORDER — PROPOFOL 10 MG/ML IV BOLUS
INTRAVENOUS | Status: AC
  Filled 2015-10-06: qty 20

## 2015-10-06 MED ORDER — FENTANYL CITRATE (PF) 250 MCG/5ML IJ SOLN
INTRAMUSCULAR | Status: AC
  Filled 2015-10-06: qty 10

## 2015-10-06 MED ORDER — BACITRACIN 50000 UNITS IM SOLR
INTRAMUSCULAR | Status: DC | PRN
  Administered 2015-10-06: 15:00:00

## 2015-10-06 MED ORDER — METHOCARBAMOL 1000 MG/10ML IJ SOLN
500.0000 mg | Freq: Four times a day (QID) | INTRAVENOUS | Status: DC | PRN
  Administered 2015-10-06: 500 mg via INTRAVENOUS
  Filled 2015-10-06 (×3): qty 5

## 2015-10-06 MED ORDER — SODIUM CHLORIDE 0.9% FLUSH: 3.0000 mL | INTRAVENOUS | Status: DC | PRN

## 2015-10-06 MED ORDER — HYDROMORPHONE HCL 1 MG/ML IJ SOLN
INTRAMUSCULAR | Status: AC
  Filled 2015-10-06: qty 1

## 2015-10-06 MED ORDER — SODIUM CHLORIDE 0.9 % IV SOLN
10.0000 mg | INTRAVENOUS | Status: DC | PRN
  Administered 2015-10-06: 20 ug/min via INTRAVENOUS

## 2015-10-06 MED ORDER — POLYETHYLENE GLYCOL 3350 17 G PO PACK
17.0000 g | PACK | Freq: Every day | ORAL | Status: DC | PRN
  Administered 2015-10-11: 17 g via ORAL
  Filled 2015-10-06 (×2): qty 1

## 2015-10-06 MED ORDER — MORPHINE SULFATE (PF) 2 MG/ML IV SOLN
1.0000 mg | INTRAVENOUS | Status: DC | PRN
  Administered 2015-10-06 (×2): 2 mg via INTRAVENOUS
  Administered 2015-10-07: 4 mg via INTRAVENOUS
  Administered 2015-10-07: 2 mg via INTRAVENOUS
  Administered 2015-10-07: 4 mg via INTRAVENOUS
  Filled 2015-10-06 (×2): qty 1
  Filled 2015-10-06 (×2): qty 2
  Filled 2015-10-06 (×2): qty 1

## 2015-10-06 MED ORDER — LACTATED RINGERS IV SOLN
INTRAVENOUS | Status: DC | PRN
  Administered 2015-10-06 (×2): via INTRAVENOUS

## 2015-10-06 MED ORDER — ROCURONIUM BROMIDE 50 MG/5ML IV SOLN
INTRAVENOUS | Status: AC
  Filled 2015-10-06: qty 1

## 2015-10-06 MED ORDER — THROMBIN 20000 UNITS EX SOLR
CUTANEOUS | Status: DC | PRN
  Administered 2015-10-06: 15:00:00 via TOPICAL

## 2015-10-06 MED ORDER — DOCUSATE SODIUM 100 MG PO CAPS
100.0000 mg | ORAL_CAPSULE | Freq: Two times a day (BID) | ORAL | Status: DC
  Administered 2015-10-07 – 2015-10-16 (×20): 100 mg via ORAL
  Filled 2015-10-06 (×20): qty 1

## 2015-10-06 MED ORDER — FENTANYL CITRATE (PF) 100 MCG/2ML IJ SOLN
INTRAMUSCULAR | Status: DC | PRN
  Administered 2015-10-06 (×2): 50 ug via INTRAVENOUS
  Administered 2015-10-06 (×2): 100 ug via INTRAVENOUS
  Administered 2015-10-06: 50 ug via INTRAVENOUS
  Administered 2015-10-06: 150 ug via INTRAVENOUS

## 2015-10-06 MED ORDER — HEPARIN SODIUM (PORCINE) 5000 UNIT/ML IJ SOLN
5000.0000 [IU] | Freq: Three times a day (TID) | INTRAMUSCULAR | Status: DC
  Administered 2015-10-07 – 2015-10-16 (×28): 5000 [IU] via SUBCUTANEOUS
  Filled 2015-10-06 (×28): qty 1

## 2015-10-06 MED ORDER — SODIUM CHLORIDE 0.9 % IV SOLN
INTRAVENOUS | Status: DC | PRN
  Administered 2015-10-06: 16:00:00 via INTRAVENOUS

## 2015-10-06 MED ORDER — SUGAMMADEX SODIUM 200 MG/2ML IV SOLN
INTRAVENOUS | Status: DC | PRN
  Administered 2015-10-06: 200 mg via INTRAVENOUS

## 2015-10-06 MED ORDER — PHENOL 1.4 % MT LIQD
1.0000 | OROMUCOSAL | Status: DC | PRN
  Administered 2015-10-10: 1 via OROMUCOSAL
  Filled 2015-10-06: qty 177

## 2015-10-06 MED ORDER — BISACODYL 10 MG RE SUPP
10.0000 mg | Freq: Every day | RECTAL | Status: DC | PRN
  Administered 2015-10-12 – 2015-10-15 (×2): 10 mg via RECTAL
  Filled 2015-10-06 (×2): qty 1

## 2015-10-06 MED ORDER — VECURONIUM BROMIDE 10 MG IV SOLR
INTRAVENOUS | Status: AC
  Filled 2015-10-06: qty 10

## 2015-10-06 MED ORDER — AMLODIPINE BESYLATE 5 MG PO TABS
5.0000 mg | ORAL_TABLET | Freq: Every day | ORAL | Status: DC
  Administered 2015-10-07 – 2015-10-16 (×7): 5 mg via ORAL
  Filled 2015-10-06 (×8): qty 1

## 2015-10-06 MED ORDER — GLIMEPIRIDE 4 MG PO TABS
4.0000 mg | ORAL_TABLET | Freq: Every day | ORAL | Status: DC
  Administered 2015-10-07 – 2015-10-16 (×10): 4 mg via ORAL
  Filled 2015-10-06 (×10): qty 1

## 2015-10-06 MED ORDER — ONDANSETRON HCL 4 MG/2ML IJ SOLN
INTRAMUSCULAR | Status: AC
  Filled 2015-10-06: qty 2

## 2015-10-06 MED ORDER — SODIUM CHLORIDE 0.9 % IV SOLN: 250.0000 mL | INTRAVENOUS | Status: DC

## 2015-10-06 MED ORDER — METHOCARBAMOL 500 MG PO TABS
500.0000 mg | ORAL_TABLET | Freq: Four times a day (QID) | ORAL | Status: DC | PRN
  Administered 2015-10-07 – 2015-10-15 (×10): 500 mg via ORAL
  Filled 2015-10-06 (×11): qty 1

## 2015-10-06 MED ORDER — LACTATED RINGERS IV SOLN
INTRAVENOUS | Status: DC
  Administered 2015-10-06: 10:00:00 via INTRAVENOUS

## 2015-10-06 MED ORDER — MIDAZOLAM HCL 5 MG/5ML IJ SOLN
INTRAMUSCULAR | Status: DC | PRN
  Administered 2015-10-06: 2 mg via INTRAVENOUS

## 2015-10-06 MED ORDER — PANTOPRAZOLE SODIUM 40 MG IV SOLR
40.0000 mg | Freq: Every day | INTRAVENOUS | Status: DC
  Administered 2015-10-07 (×2): 40 mg via INTRAVENOUS
  Filled 2015-10-06 (×2): qty 40

## 2015-10-06 MED ORDER — ALBUMIN HUMAN 5 % IV SOLN
INTRAVENOUS | Status: DC | PRN
  Administered 2015-10-06 (×2): via INTRAVENOUS

## 2015-10-06 MED ORDER — THROMBIN 5000 UNITS EX SOLR
CUTANEOUS | Status: DC | PRN
  Administered 2015-10-06: 15:00:00 via TOPICAL

## 2015-10-06 MED ORDER — INSULIN GLARGINE 100 UNIT/ML ~~LOC~~ SOLN
36.0000 [IU] | Freq: Every day | SUBCUTANEOUS | Status: DC
  Administered 2015-10-07: 36 [IU] via SUBCUTANEOUS
  Filled 2015-10-06 (×2): qty 0.36

## 2015-10-06 MED ORDER — 0.9 % SODIUM CHLORIDE (POUR BTL) OPTIME
TOPICAL | Status: DC | PRN
  Administered 2015-10-06: 1000 mL

## 2015-10-06 MED ORDER — ZOLPIDEM TARTRATE 5 MG PO TABS
5.0000 mg | ORAL_TABLET | Freq: Every evening | ORAL | Status: DC | PRN
  Administered 2015-10-07 – 2015-10-15 (×6): 5 mg via ORAL
  Filled 2015-10-06 (×6): qty 1

## 2015-10-06 SURGICAL SUPPLY — 73 items
ADHERUS DURAL SEALANT ×1 IMPLANT
APL SKNCLS STERI-STRIP NONHPOA (GAUZE/BANDAGES/DRESSINGS)
BAG DECANTER FOR FLEXI CONT (MISCELLANEOUS) ×2 IMPLANT
BENZOIN TINCTURE PRP APPL 2/3 (GAUZE/BANDAGES/DRESSINGS) IMPLANT
BIT DRILL 3.5 POWEREASE (BIT) ×1 IMPLANT
BLADE CLIPPER SURG (BLADE) IMPLANT
BLADE SURG 11 STRL SS (BLADE) ×2 IMPLANT
BUR MATCHSTICK NEURO 3.0 LAGG (BURR) ×2 IMPLANT
BUR PRECISION FLUTE 5.0 (BURR) ×2 IMPLANT
CANISTER SUCT 3000ML PPV (MISCELLANEOUS) ×2 IMPLANT
CONT SPEC 4OZ CLIKSEAL STRL BL (MISCELLANEOUS) ×2 IMPLANT
COVER BACK TABLE 60X90IN (DRAPES) ×2 IMPLANT
DECANTER SPIKE VIAL GLASS SM (MISCELLANEOUS) ×2 IMPLANT
DEVICE INTERBODY ELEVATE 23X8 (Cage) ×4 IMPLANT
DRAPE C-ARM 42X72 X-RAY (DRAPES) ×2 IMPLANT
DRAPE C-ARMOR (DRAPES) ×2 IMPLANT
DRAPE LAPAROTOMY 100X72X124 (DRAPES) ×2 IMPLANT
DRAPE POUCH INSTRU U-SHP 10X18 (DRAPES) ×2 IMPLANT
DRAPE SURG 17X23 STRL (DRAPES) ×2 IMPLANT
DRSG OPSITE POSTOP 4X8 (GAUZE/BANDAGES/DRESSINGS) ×1 IMPLANT
DURAPREP 26ML APPLICATOR (WOUND CARE) ×2 IMPLANT
ELECT REM PT RETURN 9FT ADLT (ELECTROSURGICAL) ×2
ELECTRODE REM PT RTRN 9FT ADLT (ELECTROSURGICAL) ×1 IMPLANT
GAUZE SPONGE 4X4 12PLY STRL (GAUZE/BANDAGES/DRESSINGS) IMPLANT
GAUZE SPONGE 4X4 16PLY XRAY LF (GAUZE/BANDAGES/DRESSINGS) IMPLANT
GLOVE BIOGEL PI IND STRL 7.5 (GLOVE) ×1 IMPLANT
GLOVE BIOGEL PI INDICATOR 7.5 (GLOVE) ×1
GLOVE ECLIPSE 7.0 STRL STRAW (GLOVE) ×2 IMPLANT
GLOVE EXAM NITRILE LRG STRL (GLOVE) IMPLANT
GLOVE EXAM NITRILE MD LF STRL (GLOVE) IMPLANT
GLOVE EXAM NITRILE XL STR (GLOVE) IMPLANT
GLOVE EXAM NITRILE XS STR PU (GLOVE) IMPLANT
GOWN STRL REUS W/ TWL LRG LVL3 (GOWN DISPOSABLE) ×2 IMPLANT
GOWN STRL REUS W/ TWL XL LVL3 (GOWN DISPOSABLE) IMPLANT
GOWN STRL REUS W/TWL 2XL LVL3 (GOWN DISPOSABLE) IMPLANT
GOWN STRL REUS W/TWL LRG LVL3 (GOWN DISPOSABLE) ×4
GOWN STRL REUS W/TWL XL LVL3 (GOWN DISPOSABLE)
GRAFT DURAGEN MATRIX 1WX1L (Tissue) ×1 IMPLANT
HEMOSTAT POWDER KIT SURGIFOAM (HEMOSTASIS) ×2 IMPLANT
KIT BASIN OR (CUSTOM PROCEDURE TRAY) ×2 IMPLANT
KIT INFUSE X SMALL 1.4CC (Orthopedic Implant) ×1 IMPLANT
KIT ROOM TURNOVER OR (KITS) ×2 IMPLANT
LIQUID BAND (GAUZE/BANDAGES/DRESSINGS) ×2 IMPLANT
MILL MEDIUM DISP (BLADE) ×2 IMPLANT
NDL HYPO 18GX1.5 BLUNT FILL (NEEDLE) IMPLANT
NDL HYPO 25X1 1.5 SAFETY (NEEDLE) ×1 IMPLANT
NDL SPNL 18GX3.5 QUINCKE PK (NEEDLE) IMPLANT
NEEDLE HYPO 18GX1.5 BLUNT FILL (NEEDLE) IMPLANT
NEEDLE HYPO 25X1 1.5 SAFETY (NEEDLE) ×2 IMPLANT
NEEDLE SPNL 18GX3.5 QUINCKE PK (NEEDLE) IMPLANT
NS IRRIG 1000ML POUR BTL (IV SOLUTION) ×2 IMPLANT
PACK LAMINECTOMY NEURO (CUSTOM PROCEDURE TRAY) ×2 IMPLANT
PAD ARMBOARD 7.5X6 YLW CONV (MISCELLANEOUS) ×6 IMPLANT
ROD COBALT 47.5X35 (Rod) ×2 IMPLANT
SCREW 5.5X35MM (Screw) ×8 IMPLANT
SCREW BN 35X5.5XMA NS SPNE (Screw) IMPLANT
SCREW SET SOLERA (Screw) ×8 IMPLANT
SCREW SET SOLERA TI (Screw) IMPLANT
SPACER SPNL XLORDOTIC 23X8X (Cage) IMPLANT
SPCR SPNL XLORDOTIC 23X8X (Cage) ×2 IMPLANT
SPONGE LAP 4X18 X RAY DECT (DISPOSABLE) IMPLANT
SPONGE SURGIFOAM ABS GEL 100 (HEMOSTASIS) ×2 IMPLANT
STRIP BIOACTIVE VITOSS 25X52X4 (Orthopedic Implant) ×1 IMPLANT
STRIP CLOSURE SKIN 1/2X4 (GAUZE/BANDAGES/DRESSINGS) IMPLANT
SUT VIC AB 0 CT1 18XCR BRD8 (SUTURE) ×1 IMPLANT
SUT VIC AB 0 CT1 8-18 (SUTURE) ×2
SUT VICRYL 3-0 RB1 18 ABS (SUTURE) ×2 IMPLANT
SYR 3ML LL SCALE MARK (SYRINGE) IMPLANT
TOWEL OR 17X24 6PK STRL BLUE (TOWEL DISPOSABLE) ×2 IMPLANT
TOWEL OR 17X26 10 PK STRL BLUE (TOWEL DISPOSABLE) ×2 IMPLANT
TRAP SPECIMEN MUCOUS 40CC (MISCELLANEOUS) ×2 IMPLANT
TRAY FOLEY W/METER SILVER 14FR (SET/KITS/TRAYS/PACK) ×2 IMPLANT
WATER STERILE IRR 1000ML POUR (IV SOLUTION) ×2 IMPLANT

## 2015-10-06 NOTE — Progress Notes (Signed)
PHARMACIST - PHYSICIAN ORDER COMMUNICATION  CONCERNING: P&T Medication Policy on Herbal Medications  DESCRIPTION:  This patient's order for:  Glucosamine-chondroitin  has been noted.  This product(s) is classified as an "herbal" or natural product. Due to a lack of definitive safety studies or FDA approval, nonstandard manufacturing practices, plus the potential risk of unknown drug-drug interactions while on inpatient medications, the Pharmacy and Therapeutics Committee does not permit the use of "herbal" or natural products of this type within [place].   ACTION TAKEN: The pharmacy department is unable to verify this order at this time and your patient has been informed of this safety policy. Please reevaluate patient's clinical condition at discharge and address if the herbal or natural product(s) should be resumed at that time.   Emilianna Barlowe, PharmD Clinical Pharmacist [facility]   

## 2015-10-06 NOTE — Anesthesia Postprocedure Evaluation (Signed)
Anesthesia Post Note  Patient: Tiffany [name]  Procedure(s) Performed: Procedure(s) (LRB): Lumbar Four-Five Posterior lumbar interbody fusion (N/A)  Patient location during evaluation: PACU Anesthesia Type: General Level of consciousness: awake, oriented and patient cooperative Pain management: pain level not controlled Vital Signs Assessment: post-procedure vital signs reviewed and stable Respiratory status: spontaneous breathing and respiratory function stable Cardiovascular status: stable Anesthetic complications: no    Last Vitals:  Filed Vitals:   10/06/15 0951 10/06/15 1739  BP: 165/79   Pulse: 97   Temp: 36.8 C 36.2 C  Resp: 20     Last Pain:  Filed Vitals:   10/06/15 1747  PainSc: [place]

## 2015-10-06 NOTE — Anesthesia Procedure Notes (Signed)
Procedure Name: Intubation Date/Time: 10/06/2015 2:20 PM Performed by: Jacquiline Doe A Pre-anesthesia Checklist: Patient identified, Emergency Drugs available, Suction available, Patient being monitored and Timeout performed Patient Re-evaluated:Patient Re-evaluated prior to inductionOxygen Delivery Method: Circle system utilized Preoxygenation: Pre-oxygenation with 100% oxygen Intubation Type: IV induction Ventilation: Mask ventilation without difficulty Laryngoscope Size: Mac and 4 Grade View: Grade I Tube type: Oral Tube size: 7.5 mm Number of attempts: 1 Airway Equipment and Method: Stylet Placement Confirmation: ETT inserted through vocal cords under direct vision,  positive ETCO2 and breath sounds checked- equal and bilateral Secured at: 21 cm Tube secured with: Tape Dental Injury: Teeth and Oropharynx as per pre-operative assessment

## 2015-10-06 NOTE — Transfer of Care (Signed)
Immediate Anesthesia Transfer of Care Note  Patient: Tiffany [name]  Procedure(s) Performed: Procedure(s) with comments: Lumbar Four-Five Posterior lumbar interbody fusion (N/A) - L4-5 Posterior lumbar interbody fusion  Patient Location: PACU  Anesthesia Type:General  Level of Consciousness: awake, oriented, sedated, patient cooperative and responds to stimulation  Airway & Oxygen Therapy: Patient Spontanous Breathing and Patient connected to nasal cannula oxygen  Post-op Assessment: Report given to RN, Post -op Vital signs reviewed and stable, Patient moving all extremities and Patient moving all extremities X 4  Post vital signs: Reviewed and stable  Last Vitals:  Filed Vitals:   10/06/15 0951  BP: 165/79  Pulse: 97  Temp: 36.8 C  Resp: 20    Last Pain:  Filed Vitals:   10/06/15 0959  PainSc: 7       Patients Stated Pain Goal: 3 ([id number])  Complications: No apparent anesthesia complications

## 2015-10-06 NOTE — Anesthesia Preprocedure Evaluation (Addendum)
Anesthesia Evaluation  Patient identified by MRN, date of birth, ID band Patient awake    Reviewed: Allergy & Precautions, NPO status , Patient's Chart, lab work & pertinent test results  Airway Mallampati: II  TM Distance: >3 FB Neck ROM: Full    Dental  (+) Edentulous Upper, Edentulous Lower, Dental Advisory Given   Pulmonary sleep apnea , former smoker,    breath sounds clear to auscultation       Cardiovascular hypertension, Pt. on medications  Rhythm:Regular     Neuro/Psych  Headaches,  Neuromuscular disease negative psych ROS   GI/Hepatic GERD  Medicated and Controlled,  Endo/Other  diabetes, Poorly Controlled, Type 2, Insulin Dependent  Renal/GU      Musculoskeletal  (+) Arthritis , Osteoarthritis,    Abdominal   Peds  Hematology   Anesthesia Other Findings   Reproductive/Obstetrics                            Anesthesia Physical Anesthesia Plan  ASA: III  Anesthesia Plan: General   Post-op Pain Management:    Induction: Intravenous  Airway Management Planned: Oral ETT  Additional Equipment: None  Intra-op Plan:   Post-operative Plan: Extubation in OR  Informed Consent: I have reviewed the patients History and Physical, chart, labs and discussed the procedure including the risks, benefits and alternatives for the proposed anesthesia with the patient or authorized representative who has indicated his/her understanding and acceptance.   Dental advisory given  Plan Discussed with: Anesthesiologist, CRNA and Surgeon  Anesthesia Plan Comments:        Anesthesia Quick Evaluation

## 2015-10-06 NOTE — H&P (Signed)
CC:  Back and leg pain  HPI: [name] is a 65 year old woman I'm seeing for back and leg pain related to mobile spondylolisthesis at L4 L5. We've been treating her pain conservatively, she was initially prescribed anti-inflammatories for her knee arthritis which she has been taking. She has previously undergone injection therapy nearly 2 years ago, which she says didn't really provide much relief. We also sent her for a course of physical therapy which she is completed. She says that despite her medications, and the physical therapy, she continues to have severe back pain. It prevents her from normal chores around the house, and many of her normal activities. She continues to have severe, constant low back pain, with some radiation down the back of her legs to the knees.   PMH: Past Medical History  Diagnosis Date  . Diabetes mellitus   . Hypertension   . Hyperlipidemia   . Degenerative joint disease   . Sleep apnea     stopped using it  . Headache     PSH: Past Surgical History  Procedure Laterality Date  . Lumbar laminectomy  1989  . Knee arthroscopy      left x 2  . Abdominal hysterectomy  1976  . Back surgery      SH: Social History  Substance Use Topics  . Smoking status: Former Smoker -- 0.50 packs/day for 15 years    Quit date: 08/02/1990  . Smokeless tobacco: Never Used  . Alcohol Use: No    MEDS: Prior to Admission medications   Medication Sig Start Date End Date Taking? Authorizing Provider  acetaminophen-codeine (TYLENOL #3) 300-30 MG tablet TAKE ONE TABLET BY MOUTH EVERY 8 HOURS AS NEEDED FOR  MODERATE  TO  SEVERE  PAIN 07/12/15  Yes Golden Circle, FNP  amLODipine (NORVASC) 5 MG tablet Take 1 tablet (5 mg total) by mouth daily. 10/13/14  Yes Golden Circle, FNP  glimepiride (AMARYL) 4 MG tablet Take 1 tablet (4 mg total) by mouth daily with breakfast. 08/03/15  Yes Golden Circle, FNP  Insulin Glargine (LANTUS SOLOSTAR) 100 UNIT/ML Solostar Pen Inject 36  Units into the skin at bedtime.    Yes Historical Provider, MD  oxyCODONE-acetaminophen (PERCOCET) 5-325 MG tablet Take 1 tablet by mouth every 8 (eight) hours as needed for severe pain. 05/08/15  Yes Golden Circle, FNP  quinapril (ACCUPRIL) 40 MG tablet Take 1 tablet (40 mg total) by mouth daily. 10/13/14 10/13/15 Yes Golden Circle, FNP  ranitidine (ZANTAC) 150 MG tablet Take 150 mg by mouth 2 (two) times daily as needed for heartburn.   Yes Historical Provider, MD  zolpidem (AMBIEN) 10 MG tablet Take 1 tablet (10 mg total) by mouth at bedtime as needed. 07/14/15  Yes Golden Circle, FNP  Blood Glucose Monitoring Suppl (ACCU-CHEK NANO SMARTVIEW) W/DEVICE KIT Use to check blood sugar as directed up to 3 times a day. diag code 250.02. Insulin dependent 08/25/13   Jones Bales, MD  glucosamine-chondroitin (MAX GLUCOSAMINE CHONDROITIN) 500-400 MG tablet Take 1 tablet by mouth 3 (three) times daily. 12/30/13   Jones Bales, MD  glucose blood (AGAMATRIX PRESTO TEST) test strip Use as instructed 05/08/15   Golden Circle, FNP  Insulin Pen Needle 32G X 4 MM MISC Use to inject  lantus once daily 11/18/14   Golden Circle, FNP    ALLERGY: Allergies  Allergen Reactions  . Metformin And Related     Severe nausea and vomiting  .  Aspirin Nausea And Vomiting    Can take EC asa  . Contrast Media [Iodinated Diagnostic Agents] Nausea And Vomiting  . Iodine Nausea And Vomiting  . Shellfish Allergy Nausea And Vomiting  . Penicillins   . Tramadol Nausea And Vomiting    Migraine    ROS: ROS  NEUROLOGIC EXAM: Awake, alert, oriented Memory and concentration grossly intact Speech fluent, appropriate CN grossly intact Motor exam: Upper Extremities Deltoid Bicep Tricep Grip  Right 5/5 5/5 5/5 5/5  Left 5/5 5/5 5/5 5/5   Lower Extremity IP Quad PF DF EHL  Right 5/5 5/5 5/5 5/5 5/5  Left 5/5 5/5 5/5 5/5 5/5   Sensation grossly intact to LT   IMPRESSION: 65 year old woman with primarily  low back pain related to mobile spondylolisthesis at L4 L5, who has failed reasonable conservative treatments including anti-inflammatory medication, injection therapy, and a course of physical therapy.  PLAN: - Proceed with  L4 L5 posterior lumbar interbody fusion   I explained to the patient in the office the details of the procedure, as well as the risks which include but are not limited to nerve root injury leading to leg or foot weakness and or bowel and bladder dysfunction, CSF leak, bleeding, and infection. Possible outcomes of surgery were also discussed including the possibility of persistence of pain symptoms and the possiblity of accelerated adjacent level degeneration. The general risks of anesthesia were also reviewed including heart attack, stroke, and DVT/PE.   The patient understood our discussion and is willing to proceed with surgical decompression and fusion. All her questions were answered.

## 2015-10-06 NOTE — Op Note (Signed)
PREOP DIAGNOSIS:  1. Spondylolisthesis, L4-5  POSTOP DIAGNOSIS: Same  PROCEDURE: 1. L4 laminectomy with facetectomy for decompression of exiting nerve roots, more than would be required for placement of interbody graft 2. Placement of anterior interbody device - [employer]tronic expandable 71mm lordotic cage x2 3. Posterior instrumentation using cortical pedicle screws at L4 - L5 4. Interbody arthrodesis, L4-5 5. Use of locally harvested bone autograft 6. Use of non-structural bone allograft - Vitoss BA  SURGEON: Dr. Consuella Lose, MD  ASSISTANT: Dr. Kary Kos, MD  ANESTHESIA: General Endotracheal  EBL: 700cc  SPECIMENS: None  DRAINS: None  COMPLICATIONS: None immediate  CONDITION: Hemodynamically stable to PACU  HISTORY: Tiffany [name] is a 65 y.o. female who has been followed in the outpatient clinic with back and leg pain related to mobile spondylolisthesis at L4-5 She attempted multiple conservative treatments and ultimately elected to proceed with surgical stabilization and fusion. Risks and benefits were reviewed and consent was obtained.  PROCEDURE IN DETAIL: After informed consent was obtained and witnessed, the patient was brought to the operating room. After induction of general anesthesia, the patient was positioned on the operative table in the prone position. All pressure points were meticulously padded. Incision was then marked out and prepped and draped in the usual sterile fashion.  After timeout was conducted, skin was infiltrated with local anesthetic. Skin incision was then made sharply and Bovie electrocautery was used to dissect the subcutaneous tissue until the lumbodorsal fascia was identified and incised. The muscle was then elevated in the subperiosteal plane and the L4 lamina and facet complexes were identified. Self-retaining retractors were then placed.  Using intraoperative fluoroscopy, entry points for bilateral L4 cortical pedicle screws were  identified. Pilot holes were then created under lateral fluoroscopy and tapped to 5.5 x 35 mm.  At this point attention was turned to decompression. Complete L4 laminectomy with facetectomy was completed using a combination of Kerrison rongeurs and a high-speed drill. Good decompression of the exiting and traversing roots was confirmed. A small durotomy was accidentally made along the lateral surface of the rightL5 root during removal of the medial aspect of the right superior articulating process.  Disc space was then identified, incised, and using a combination of shavers, curettes and rongeurs, complete discectomy was completed. Endplates were prepared, and a 44mm expandable cage was tapped into place after placement of autograft, Vitoss, and BMP into the disc space. Good position was confirmed with fluoroscopy.  At this point, the L5 cortical pedicle screws were drilled and tapped to 5.5 mm. Screws were then placed in L4 and L5. Rod was then placed, set screws placed and final tightened. Final AP and lateral fluoroscopic images confirmed good position.  The small durotomy was then covered with a piece of duragen. Dural sealant was then applied. No CSF egress was seen after closure.  The wound was then irrigated with copious amounts of antibiotic saline, then closed in standard fashion using a combination of interrupted 0 and 3-0 Vicryl stitches in the muscular, fascial, and subcutaneous layers. Skin was then closed using standard Dermabond. Sterile dressing was then applied. The patient was then transferred to the stretcher, extubated, and taken to the postanesthesia care unit in stable hemodynamic condition.  At the end of the case all sponge, needle, cottonoid, and instrument counts were correct.

## 2015-10-07 LAB — BASIC METABOLIC PANEL
ANION GAP: 10 (ref 5–15)
BUN: 5 mg/dL — ABNORMAL LOW (ref 6–20)
CALCIUM: 8.4 mg/dL — AB (ref 8.9–10.3)
CO2: 24 mmol/L (ref 22–32)
Chloride: 103 mmol/L (ref 101–111)
Creatinine, Ser: 0.72 mg/dL (ref 0.44–1.00)
GLUCOSE: 200 mg/dL — AB (ref 65–99)
POTASSIUM: 3.8 mmol/L (ref 3.5–5.1)
SODIUM: 137 mmol/L (ref 135–145)

## 2015-10-07 LAB — CBC
HCT: 31 % — ABNORMAL LOW (ref 36.0–46.0)
Hemoglobin: 10.1 g/dL — ABNORMAL LOW (ref 12.0–15.0)
MCH: 28 pg (ref 26.0–34.0)
MCHC: 32.6 g/dL (ref 30.0–36.0)
MCV: 85.9 fL (ref 78.0–100.0)
PLATELETS: 176 10*3/uL (ref 150–400)
RBC: 3.61 MIL/uL — AB (ref 3.87–5.11)
RDW: 13.3 % (ref 11.5–15.5)
WBC: 6.3 10*3/uL (ref 4.0–10.5)

## 2015-10-07 LAB — GLUCOSE, CAPILLARY
GLUCOSE-CAPILLARY: 178 mg/dL — AB (ref 65–99)
GLUCOSE-CAPILLARY: 199 mg/dL — AB (ref 65–99)
GLUCOSE-CAPILLARY: 222 mg/dL — AB (ref 65–99)
GLUCOSE-CAPILLARY: 39 mg/dL — AB (ref 65–99)
GLUCOSE-CAPILLARY: 46 mg/dL — AB (ref 65–99)
GLUCOSE-CAPILLARY: 63 mg/dL — AB (ref 65–99)
GLUCOSE-CAPILLARY: 81 mg/dL (ref 65–99)
Glucose-Capillary: 43 mg/dL — CL (ref 65–99)
Glucose-Capillary: 141 mg/dL — ABNORMAL HIGH (ref 65–99)

## 2015-10-07 MED ORDER — GABAPENTIN 300 MG PO CAPS
300.0000 mg | ORAL_CAPSULE | Freq: Three times a day (TID) | ORAL | Status: DC
  Administered 2015-10-07 – 2015-10-16 (×29): 300 mg via ORAL
  Filled 2015-10-07 (×29): qty 1

## 2015-10-07 MED ORDER — OXYCODONE HCL 5 MG PO TABS
5.0000 mg | ORAL_TABLET | ORAL | Status: DC | PRN
Start: 2015-10-07 — End: 2015-10-11
  Administered 2015-10-07 – 2015-10-09 (×10): 5 mg via ORAL
  Filled 2015-10-07 (×10): qty 1

## 2015-10-07 MED ORDER — HYDROMORPHONE HCL 1 MG/ML IJ SOLN
1.0000 mg | INTRAMUSCULAR | Status: DC | PRN
  Administered 2015-10-07 (×2): 1 mg via INTRAVENOUS
  Filled 2015-10-07 (×2): qty 1

## 2015-10-07 MED ORDER — INSULIN GLARGINE 100 UNIT/ML ~~LOC~~ SOLN
24.0000 [IU] | Freq: Every day | SUBCUTANEOUS | Status: DC
  Administered 2015-10-07 – 2015-10-15 (×9): 24 [IU] via SUBCUTANEOUS
  Filled 2015-10-07 (×11): qty 0.24

## 2015-10-07 NOTE — Progress Notes (Signed)
Hypoglycemic Event  CBG: 46  Treatment: 15 GM carbohydrate snack  Symptoms: None  Follow-up CBG: Time:1656 CBG Result:43  Possible Reasons for Event: Inadequate meal intake  Comments/MD notified:Dr. Dittty notified and he ordered to decrease pt Lantus to 24units.    Tiffany [name]

## 2015-10-07 NOTE — Progress Notes (Signed)
OT Cancellation Note  Patient Details Name: Tiffany [name] MRN: [id number] DOB: September 23, 1950   Cancelled Treatment:    Reason Eval/Treat Not Completed:  Pt on bed rest per order. Please update when appropriate for OT evaluation.  Benito Mccreedy OTR/L [id number] 10/07/2015, 8:36 AM

## 2015-10-07 NOTE — Progress Notes (Signed)
Patient  65 yrs old female ,s/p L4-L 5  PLIF alert and oriented x 3 lives alone ,from home cancer/o dura tear  To lay flat in bed  Foley to gravity draining clear umber urine. Care of pain, medicated ./use of other alternative such as cold and warm. Pack. C /o nausea .zofran x 2 given . Put on full liquid diet per pt request and advance as tolerated.

## 2015-10-07 NOTE — Progress Notes (Signed)
Pt CBG up to 141 after 15mg  carb given. Will continue to closely monitor pt. [name] [name]

## 2015-10-07 NOTE — Progress Notes (Signed)
PT Cancellation Note  Patient Details Name: Tiffany [name] MRN: [id number] DOB: 1950/08/14   Cancelled Treatment:    Reason Eval/Treat Not Completed: Patient not medically ready Pt on bed rest per order due to concern for dural tear. Bed rest order until 7:38 pm 5/13. Will follow up once appropriate for PT evaluation.   [place] 10/07/2015, 8:37 AM Wray Kearns, PT, DPT [id number]

## 2015-10-07 NOTE — Progress Notes (Signed)
No acute events Complains of back and neuropathic leg pain No headache Awake and alert Moves legs well Stable Keep flat until this evening Start neurontin

## 2015-10-07 NOTE — Care Management Note (Signed)
Case Management Note  Patient Details  Name: Tiffany [name] MRN: [id number] Date of Birth: 11-28-1950  Subjective/Objective:    64 y.o. F admitted 10/06/15 for Spondylolisthesis, L4-5. S/P L4-5 PLIF. Pt is BR til 1938 10/07/2015 and PT is unable to evaluate. Pt is from home alone.                 Action/Plan: Will continue to follow for disposition/discharge needs.    Expected Discharge Date:                  Expected Discharge Plan:     In-House Referral:     Discharge planning Services     Post Acute Care Choice:    Choice offered to:     DME Arranged:    DME Agency:     HH Arranged:    [place] Agency:     Status of Service:     Medicare Important Message Given:    Date Medicare IM Given:    Medicare IM give by:    Date Additional Medicare IM Given:    Additional Medicare Important Message give by:     If discussed at [place] of Stay Meetings, dates discussed:    Additional Comments:  Delrae Sawyers, RN 10/07/2015, 12:14 PM

## 2015-10-08 LAB — GLUCOSE, CAPILLARY
GLUCOSE-CAPILLARY: 205 mg/dL — AB (ref 65–99)
GLUCOSE-CAPILLARY: 198 mg/dL — AB (ref 65–99)
GLUCOSE-CAPILLARY: 129 mg/dL — AB (ref 65–99)
Glucose-Capillary: 94 mg/dL (ref 65–99)

## 2015-10-08 MED ORDER — PANTOPRAZOLE SODIUM 40 MG PO TBEC
40.0000 mg | DELAYED_RELEASE_TABLET | Freq: Every day | ORAL | Status: DC
  Administered 2015-10-08 – 2015-10-16 (×9): 40 mg via ORAL
  Filled 2015-10-08 (×9): qty 1

## 2015-10-08 NOTE — Progress Notes (Signed)
No acute events Pain getting better Tmax 102  VSS Incision looks good Moves legs well Stable Pain control Mobilize

## 2015-10-08 NOTE — Progress Notes (Signed)
OT Cancellation Note  Patient Details Name: Tiffany [name] MRN: [id number] DOB: 1950-08-15   Cancelled Treatment:    Reason Eval/Treat Not Completed:  (Pt does not have back brace. Notified nurse.)  Benito Mccreedy OTR/L [id number] 10/08/2015, 11:41 AM

## 2015-10-08 NOTE — Progress Notes (Signed)
Patient refused to have foley cath discontinued. Explained the risks of keeping foley in and need for patient to sit up in bed. Patient also encouraged to increase frequency of  Use of Incentive spirometry.

## 2015-10-08 NOTE — Evaluation (Signed)
Physical Therapy Evaluation Patient Details Name: Tiffany [name] MRN: [id number] DOB: 1950/07/27 Today's Date: 10/08/2015   History of Present Illness  65 y.o. s/p Lumbar Four-Five Posterior lumbar interbody fusion. PMH includes DM, HTN, HLD, DJD, headache, previous back surgery.     Clinical Impression  Pt presents with moderate limitations to functional mobility related to pain, lethargy, decr ROM, weakness, exacerbated by prolonged bedrest and resulting in dependency for mobility.  Expect she will recover function over the next few days enough to return home with intermittent supervision and HHPT.  PT will initiate rehab in acute setting and monitor progress and update d/c recommendations if indicated.  See below for details of exam findings and Care Plan for goals of care.      Follow Up Recommendations Home health PT    Equipment Recommendations  Rolling walker with 5" wheels    Recommendations for Other Services       Precautions / Restrictions Precautions Precautions: Fall;Back Precaution Booklet Issued: No Precaution Comments: educated on back precautions Required Braces or Orthoses: Spinal Brace Spinal Brace: Lumbar corset;Applied in sitting position Restrictions Weight Bearing Restrictions: No      Mobility  Bed Mobility Overal bed mobility: Needs Assistance Bed Mobility: Rolling;Sidelying to Sit;Sit to Sidelying Rolling: Supervision Sidelying to sit: Mod assist (progressing to minguard )     Sit to sidelying: Supervision General bed mobility comments: cues for technique.   Transfers Overall transfer level: Needs assistance Equipment used: Rolling walker (2 wheeled) Transfers: Sit to/from [facility] Sit to Stand: Mod assist;+2 safety/equipment Stand pivot transfers: Min assist;+2 safety/equipment       General transfer comment: phys assist to initial lift off, improves on second trial, pt still lethargic, sore and needs incr time to ready  self to go.; second person for safety, to remove BSC and replace with RW and to adjust lines and move IV   Ambulation/Gait             General Gait Details: deferred due to lightheadedness  Stairs            Wheelchair Mobility    Modified Rankin (Stroke Patients Only)       Balance Overall balance assessment: No apparent balance deficits (not formally assessed)                                           Pertinent Vitals/Pain Pain Assessment: Faces Pain Score: 7  Faces Pain Scale: Hurts whole lot Pain Location: back, latero-posterior knee/thigh Pain Descriptors / Indicators: Grimacing;Other (Comment) (tightness) Pain Intervention(s): Limited activity within patient's tolerance;Monitored during session;Premedicated before session;Repositioned;Heat applied    Home Living Family/patient expects to be discharged to:: Private residence Living Arrangements: Alone Available Help at Discharge: Family;Available PRN/intermittently Type of Home: Apartment Home Access: Level entry     Home Layout: One level Home Equipment: Cane - single point Additional Comments: son can check on in evenings    Prior Function Level of Independence: Independent               Hand Dominance        Extremity/Trunk Assessment   Upper Extremity Assessment: Defer to OT evaluation           Lower Extremity Assessment: Generalized weakness         Communication   Communication: No difficulties  Cognition Arousal/Alertness: Lethargic Behavior During Therapy: [facility]  for tasks assessed/performed Overall Cognitive Status: Within Functional Limits for tasks assessed                      General Comments      Exercises        Assessment/Plan    PT Assessment Patient needs continued PT services  PT Diagnosis Acute pain;Difficulty walking   PT Problem List Decreased strength;Pain;Cardiopulmonary status limiting activity;Decreased knowledge of  use of DME;Decreased cognition;Decreased mobility;Decreased activity tolerance;Decreased range of motion  PT Treatment Interventions Modalities;Patient/family education;Therapeutic activities;Functional mobility training;Gait training;DME instruction   PT Goals (Current goals can be found in the Care Plan section) Acute Rehab PT Goals Patient Stated Goal: not stated PT Goal Formulation: With patient Time For Goal Achievement: 10/15/15 Potential to Achieve Goals: Good    Frequency Min 5X/week   Barriers to discharge Decreased caregiver support lives alone    Co-evaluation   Reason for Co-Treatment: For patient/therapist safety   OT goals addressed during session: ADL's and self-care;Other (comment) (mobility)       End of Session Equipment Utilized During Treatment: Back brace Activity Tolerance: Patient limited by lethargy;Patient limited by pain Patient left: in chair;with call bell/phone within reach;with family/visitor present Nurse Communication: Mobility status (return in 30 min to assist pt back to bed)         Time: [id number] PT Time Calculation (min) (ACUTE ONLY): 31 min   Charges:   PT Evaluation $PT Eval Moderate Complexity: 1 Procedure PT Treatments $Therapeutic Activity: 8-22 mins   PT G Codes:        Herbie Drape 10/08/2015, 4:26 PM

## 2015-10-08 NOTE — Evaluation (Addendum)
Occupational Therapy Evaluation Patient Details Name: Tiffany [name] MRN: [id number] DOB: July 21, 1950 Today's Date: 10/08/2015    History of Present Illness 65 y.o. s/p Lumbar Four-Five Posterior lumbar interbody fusion. PMH includes DM, HTN, HLD, DJD, headache, previous back surgery.    Clinical Impression   Pt s/p above. Pt independent with ADLs, PTA. Feel pt will benefit from acute OT to increase independence prior to d/c. Pt lightheaded in session.    Follow Up Recommendations  Home health OT;Supervision/Assistance - 24 hour    Equipment Recommendations  3 in 1 bedside comode;Other (comment) (AE; RW-2 wheeled)    Recommendations for Other Services       Precautions / Restrictions Precautions Precautions: Fall;Back Precaution Booklet Issued: No Precaution Comments: educated on back precautions Required Braces or Orthoses: Spinal Brace Spinal Brace: Lumbar corset;Applied in sitting position Restrictions Weight Bearing Restrictions: No      Mobility Bed Mobility Overal bed mobility: Needs Assistance Bed Mobility: Rolling;Sidelying to Sit;Sit to Sidelying Rolling: Supervision Sidelying to sit: Mod assist (Mod A-Min guard)     Sit to sidelying: Supervision General bed mobility comments: cues for technique.   Transfers Overall transfer level: Needs assistance Equipment used: Rolling walker (2 wheeled) Transfers: Sit to/from [facility] Sit to Stand: Mod assist;+2 safety/equipment Stand pivot transfers: Min assist;+2 safety/equipment       General transfer comment: second person provided Min guard assist for sit to stand from bed and stand pivot transfer. Cued for hand placement.     Balance      Min assist for stand pivot transfer with second person providing Min guard assist for safety. Pt used RW.                                       ADL Overall ADL's : Needs assistance/impaired                 Upper Body Dressing  : Minimal assistance;Sitting   Lower Body Dressing: Total assistance;Sit to/from stand;+2 for safety/equipment   Toilet Transfer: +2 for safety/equipment;Moderate assistance;Stand-pivot;RW;BSC (Min-Mod A-sit to stand; Min A (+2 for safety)-stand pivot) Toilet Transfer Details (indicate cue type and reason): second person provided min guard assist for sit to stand from bed and for stand pivot transfer.  Toileting- Clothing Manipulation and Hygiene: Total assistance;+2 for physical assistance;Sit to/from stand;+2 for safety/equipment       Functional mobility during ADLs: Rolling walker;+2 for safety/equipment (Mod A-Min A for sit to stand; Min A-stand pivot; second person provided Min guard for sit to stand from bed and for stand pivot transfer) General ADL Comments: [employer]? man talked with pt about back brace and educated her on it while pt was sitting EOB.     Vision     Perception     Praxis      Pertinent Vitals/Pain Pain Assessment: 0-10 Pain Score: 7  Pain Location: back and legs Pain Descriptors / Indicators: Grimacing;Other (Comment) (tightness) Pain Intervention(s): Monitored during session     Hand Dominance     Extremity/Trunk Assessment Upper Extremity Assessment Upper Extremity Assessment: Overall WFL for tasks assessed   Lower Extremity Assessment Lower Extremity Assessment: Defer to PT evaluation       Communication Communication Communication: No difficulties   Cognition Arousal/Alertness: Lethargic Behavior During Therapy: WFL for tasks assessed/performed Overall Cognitive Status:  (unsure of baseline-seemed to have slow processing)  General Comments       Exercises       Shoulder Instructions      Home Living Family/patient expects to be discharged to:: Private residence Living Arrangements: Alone Available Help at Discharge: Family;Available PRN/intermittently Type of Home: Apartment Home Access: Level entry      Home Layout: One level     Bathroom Shower/Tub: Tub/shower unit         Home Equipment: Cane - single point          Prior Functioning/Environment Level of Independence: Independent             OT Diagnosis: Acute pain   OT Problem List: Decreased strength;Pain;Decreased knowledge of use of DME or AE;Decreased knowledge of precautions;Decreased range of motion;Decreased activity tolerance;Impaired balance (sitting and/or standing);Decreased cognition   OT Treatment/Interventions: Self-care/ADL training;Therapeutic activities;Balance training;Patient/family education;DME and/or AE instruction;Cognitive remediation/compensation    OT Goals(Current goals can be found in the care plan section) Acute Rehab OT Goals Patient Stated Goal: not stated OT Goal Formulation: With patient Time For Goal Achievement: 10/15/15 Potential to Achieve Goals: Good  OT Frequency: Min 2X/week   Barriers to D/C:            Co-evaluation PT/OT/SLP Co-Evaluation/Treatment: Yes (for part of session) Reason for Co-Treatment: For patient/therapist safety   OT goals addressed during session: ADL's and self-care;Other (comment) (mobility)      End of Session Equipment Utilized During Treatment: Rolling walker;Back brace Nurse Communication: Other (comment) (pt lightheaded)  Activity Tolerance: Other (comment) (lightheaded) Patient left: in chair;Other (comment) (with PT)   Time: [id number] OT Time Calculation (min): 27 min Charges:  OT General Charges $OT Visit: 1 Procedure OT Evaluation $OT Eval Moderate Complexity: 1 Procedure G-CodesBenito Mccreedy OTR/L [id number] 10/08/2015, 4:12 PM

## 2015-10-09 LAB — GLUCOSE, CAPILLARY
GLUCOSE-CAPILLARY: 32 mg/dL — AB (ref 65–99)
GLUCOSE-CAPILLARY: 149 mg/dL — AB (ref 65–99)
GLUCOSE-CAPILLARY: 215 mg/dL — AB (ref 65–99)
GLUCOSE-CAPILLARY: 104 mg/dL — AB (ref 65–99)
GLUCOSE-CAPILLARY: 82 mg/dL (ref 65–99)
Glucose-Capillary: 29 mg/dL — CL (ref 65–99)

## 2015-10-09 LAB — POCT I-STAT 4, (NA,K, GLUC, HGB,HCT)
Glucose, Bld: 167 mg/dL — ABNORMAL HIGH (ref 65–99)
HCT: 35 % — ABNORMAL LOW (ref 36.0–46.0)
Hemoglobin: 11.9 g/dL — ABNORMAL LOW (ref 12.0–15.0)
POTASSIUM: 3.5 mmol/L (ref 3.5–5.1)
SODIUM: 139 mmol/L (ref 135–145)

## 2015-10-09 MED ORDER — HEMOSTATIC AGENTS (NO CHARGE) OPTIME
TOPICAL | Status: DC | PRN
  Administered 2015-10-06: 1 via TOPICAL

## 2015-10-09 NOTE — Clinical Social Work Note (Signed)
CSW received referral for SNF.  PT is recommending home health, plan is to discharge home.  CSW to sign off please re-consult if social work needs arise.  Jones Broom. [place], MSW, [place]

## 2015-10-09 NOTE — Progress Notes (Signed)
No issues overnight. Pt reports back and leg soreness, leg pain worsened with SCDs.   EXAM:  BP 148/85 mmHg  Pulse 92  Temp(Src) 99 F (37.2 C) (Oral)  Resp 16  Ht 5\' 3"  (1.6 m)  Wt 70.262 kg (154 lb 14.4 oz)  BMI 27.45 kg/m2  SpO2 96%  Awake, alert, oriented  Speech fluent, appropriate  CN grossly intact  5/5 BUE/BLE   IMPRESSION:  65 y.o. female POD# 3 s/p L4-5 PLIF, recovering slowly  PLAN: - Cont efforts at mobilization - Cont neurontin - Anticipate d/c home with HHPT/OT in the next day or two.

## 2015-10-09 NOTE — Progress Notes (Signed)
Occupational Therapy Treatment Patient Details Name: Tiffany [name] MRN: [id number] DOB: 14-Feb-1951 Today's Date: 10/09/2015    History of present illness 65 y.o. s/p Lumbar Four-Five Posterior lumbar interbody fusion. PMH includes DM, HTN, HLD, DJD, headache, previous back surgery.    OT comments  Pt progressing. Education provided in session. Continue to recommend [place] upon d/c. Feel pt will continue to benefit from acute OT to increase independence prior to d/c.  Follow Up Recommendations  Home health OT;Supervision/Assistance - 24 hour    Equipment Recommendations  3 in 1 bedside comode;Other (comment) (AE; RW-2 wheeled)    Recommendations for Other Services      Precautions / Restrictions Precautions Precautions: Fall;Back Precaution Booklet Issued: No Precaution Comments: educated/reviewed back precautions Required Braces or Orthoses: Spinal Brace Spinal Brace: Lumbar corset (already on in session-adjusted in sitting) Restrictions Weight Bearing Restrictions: No       Mobility Bed Mobility Overal bed mobility: Needs Assistance Bed Mobility: Rolling;Sit to Sidelying Rolling: Supervision       Sit to sidelying: Supervision General bed mobility comments: cues for technique.   Transfers Overall transfer level: Needs assistance   Transfers: Sit to/from Stand Sit to Stand: Min assist         General transfer comment: RW in front of pt prior to stand.    Balance    Min guard for ambulation with RW.                                ADL Overall ADL's : Needs assistance/impaired   Eating/Feeding Details (indicate cue type and reason): pt able to drink water in bed with no difficulty or assist needed.             Upper Body Dressing : Minimal assistance;Sitting Upper Body Dressing Details (indicate cue type and reason): adjusted back brace Lower Body Dressing: Set up;Supervision/safety;With adaptive equipment;Sitting/lateral leans Lower Body  Dressing Details (indicate cue type and reason): donned/doffed sock Toilet Transfer: Minimal assistance;Ambulation;RW (sit to stand from chair-Min A; Min guard-ambulation)           Functional mobility during ADLs: Rolling walker (Min A-sit to stand; Min guard-ambulation) General ADL Comments: Discussed incorporating precautions into functional tasks. Educated on information about back brace. Recommended pt not sit in chair longer than 20 minutes. Cues to try to keep back straight in session.      Vision                     Perception     Praxis      Cognition  lethargic Behavior During Therapy: WFL for tasks assessed/performed;Flat affect Overall Cognitive Status:  (unsure of baseline-seemed to have slow processing and reported PT had already seen her today but they hadn't)                       Extremity/Trunk Assessment               Exercises     Shoulder Instructions       General Comments      Pertinent Vitals/ Pain       Pain Assessment: 0-10 Pain Score:  (8-9) Pain Location: back and legs Pain Descriptors / Indicators: Grimacing Pain Intervention(s): Monitored during session;Repositioned  Home Living  Prior Functioning/Environment              Frequency Min 2X/week     Progress Toward Goals  OT Goals(current goals can now be found in the care plan section)  Progress towards OT goals: Progressing toward goals  Acute Rehab OT Goals Patient Stated Goal: get back to doing everything OT Goal Formulation: With patient Time For Goal Achievement: 10/15/15 Potential to Achieve Goals: Good ADL Goals Pt Will Perform Grooming: with set-up;standing Pt Will Perform Lower Body Dressing: with set-up;with supervision;sit to/from stand;with adaptive equipment Pt Will Transfer to Toilet: with supervision;ambulating;bedside commode;with set-up (using RW) Pt Will Perform Toileting -  Clothing Manipulation and hygiene: with set-up;with supervision;sit to/from stand Additional ADL Goal #1: Pt will independently verbalize 3/3 back precautions and maintain in session.  Plan Discharge plan remains appropriate    Co-evaluation                 End of Session Equipment Utilized During Treatment: Gait belt;Rolling walker;Other (comment);Back brace (AE)   Activity Tolerance Other (comment) (dizziness)   Patient Left in bed;with bed alarm set;with call bell/phone within reach   Nurse Communication          Time: [id number] OT Time Calculation (min): 18 min  Charges: OT General Charges $OT Visit: 1 Procedure OT Treatments $Self Care/Home Management : 8-22 mins  Benito Mccreedy OTR/L [id number] 10/09/2015, 10:14 AM

## 2015-10-09 NOTE — Progress Notes (Signed)
Physical Therapy Treatment Patient Details Name: Tiffany [name] MRN: [id number] DOB: 1950/08/04 Today's Date: 10/09/2015    History of Present Illness 65 y.o. s/p Lumbar Four-Five Posterior lumbar interbody fusion. PMH includes DM, HTN, HLD, DJD, headache, previous back surgery.     PT Comments    Patient tolerated ambulation into bathroom without dizziness, however became hot, sweating, and lightheaded while in bathroom and required min assist to walk back to chair (with RW). RN in to assess;CBG >200; she feels due to pain medicine. Currently making slower than anticipated progress. Will need a few more days prior to d/c home with family.    Follow Up Recommendations  Home health PT;Supervision for mobility/OOB     Equipment Recommendations  Rolling walker with 5" wheels    Recommendations for Other Services       Precautions / Restrictions Precautions Precautions: Fall;Back Precaution Booklet Issued: No Precaution Comments: pt able to state 2 of 3 (not twisting); continued education while mobilizing to adhere Required Braces or Orthoses: Spinal Brace Spinal Brace: Lumbar corset;Applied in sitting position Restrictions Weight Bearing Restrictions: No    Mobility  Bed Mobility Overal bed mobility: Needs Assistance Bed Mobility: Rolling;Sidelying to Sit Rolling: Supervision Sidelying to sit: Min assist     Sit to sidelying: Supervision General bed mobility comments: vc for technique to maintain back precautions; HOB flat, +rail  Transfers Overall transfer level: Needs assistance Equipment used: Rolling walker (2 wheeled) Transfers: Sit to/from Stand Sit to Stand: Min assist;From elevated surface         General transfer comment: steady assist during transition of hands surface to/from RW; vc each time for safe use of RW  Ambulation/Gait Ambulation/Gait assistance: Min assist Ambulation Distance (Feet): 12 Feet (toileted, 14) Assistive device: Rolling walker (2  wheeled) Gait Pattern/deviations: Step-through pattern;Decreased stride length;Trunk flexed Gait velocity: slow   General Gait Details: pushing RW too far ahead and required physical assist to maintain proper distance; became hot, sweaty, dizzy in bathroom (urinated only) and required incr cues and assist to return to chair; RN notfied and her CBG was recently taken (>200), feels related to pain meds   Stairs            Wheelchair Mobility    Modified Rankin (Stroke Patients Only)       Balance                                    Cognition Arousal/Alertness: Lethargic;Suspect due to medications Behavior During Therapy: Flat affect Overall Cognitive Status: Within Functional Limits for tasks assessed                      Exercises      General Comments        Pertinent Vitals/Pain Pain Assessment: Faces (could not state a number) Pain Score:  (8-9) Faces Pain Scale: Hurts little more Pain Location: back and lateral knees Pain Descriptors / Indicators: Grimacing Pain Intervention(s): Limited activity within patient's tolerance;Monitored during session;Repositioned    Home Living                      Prior Function            PT Goals (current goals can now be found in the care plan section) Acute Rehab PT Goals Patient Stated Goal: get back to doing everything Time For Goal Achievement: 10/15/15 Progress towards  PT goals: Not progressing toward goals - comment (dizziness; ?med related)    Frequency  Min 5X/week    PT Plan Current plan remains appropriate (although slower progress)    Co-evaluation             End of Session Equipment Utilized During Treatment: Back brace (able to don after set-up) Activity Tolerance: Patient limited by lethargy;Treatment limited secondary to medical complications (Comment) (dizziness) Patient left: in chair;with call bell/phone within reach;with nursing/sitter in room;with chair  alarm set     Time: [id number] PT Time Calculation (min) (ACUTE ONLY): 31 min  Charges:  $Gait Training: 8-22 mins $Therapeutic Activity: 8-22 mins                    G Codes:      Elian Gloster 11-04-2015, 12:23 PM Pager [phone]

## 2015-10-09 NOTE — Care Management Note (Signed)
Case Management Note  Patient Details  Name: Tiffany [name] MRN: [id number] Date of Birth: 29-Aug-1950  Subjective/Objective:                    Action/Plan: Plan is for Home with HH. CM following for discharge needs.   Expected Discharge Date:                  Expected Discharge Plan:     In-House Referral:     Discharge planning Services  CM Consult  Post Acute Care Choice:    Choice offered to:     DME Arranged:    DME Agency:     HH Arranged:    HH Agency:     Status of Service:  In process, will continue to follow  Medicare Important Message Given:    Date Medicare IM Given:    Medicare IM give by:    Date Additional Medicare IM Given:    Additional Medicare Important Message give by:     If discussed at [place] of Stay Meetings, dates discussed:    Additional Comments:  Pollie Friar, RN 10/09/2015, 2:10 PM

## 2015-10-09 NOTE — Progress Notes (Signed)
Utilization review completed.  

## 2015-10-10 ENCOUNTER — Encounter (HOSPITAL_COMMUNITY): Payer: Self-pay | Admitting: Neurosurgery

## 2015-10-10 LAB — GLUCOSE, CAPILLARY
GLUCOSE-CAPILLARY: 193 mg/dL — AB (ref 65–99)
GLUCOSE-CAPILLARY: 99 mg/dL (ref 65–99)
Glucose-Capillary: 119 mg/dL — ABNORMAL HIGH (ref 65–99)
Glucose-Capillary: 62 mg/dL — ABNORMAL LOW (ref 65–99)
Glucose-Capillary: 115 mg/dL — ABNORMAL HIGH (ref 65–99)

## 2015-10-10 NOTE — Care Management Important Message (Signed)
Important Message  Patient Details  Name: Tiffany [name] MRN: [id number] Date of Birth: 1951/04/24   Medicare Important Message Given:  Yes    Rolm Baptise, RN 10/10/2015, 11:57 AM

## 2015-10-10 NOTE — Progress Notes (Signed)
Physical Therapy Treatment Patient Details Name: Tiffany [name] MRN: [id number] DOB: April 16, 1951 Today's Date: 10/10/2015    History of Present Illness 65 y.o. s/p Lumbar Four-Five Posterior lumbar interbody fusion. PMH includes DM, HTN, HLD, DJD, headache, previous back surgery.     PT Comments    Patient progressing gradually toward mobility goals. Continue to progress as tolerated with anticipated d/c home with HHPT.   Follow Up Recommendations  Home health PT;Supervision for mobility/OOB     Equipment Recommendations  Rolling walker with 5" wheels    Recommendations for Other Services       Precautions / Restrictions Precautions Precautions: Fall;Back Precaution Booklet Issued: No Precaution Comments: educated/reviewed back precautions Required Braces or Orthoses: Spinal Brace Spinal Brace: Lumbar corset;Applied in sitting position Restrictions Weight Bearing Restrictions: No    Mobility  Bed Mobility Overal bed mobility: Needs Assistance Bed Mobility: Rolling;Sidelying to Sit Rolling: Supervision Sidelying to sit: Min guard       General bed mobility comments: vc for sequencing and for back precautions; HOB flat and min use of bedrail  Transfers Overall transfer level: Needs assistance Equipment used: Rolling walker (2 wheeled) Transfers: Sit to/from Stand Sit to Stand: Min guard         General transfer comment: increased time needed and cues for hand placement and technique  Ambulation/Gait Ambulation/Gait assistance: Min guard Ambulation Distance (Feet): 45 Feet Assistive device: Rolling walker (2 wheeled) Gait Pattern/deviations: Step-through pattern;Decreased stride length;Trunk flexed Gait velocity: slow   General Gait Details: cues for posture/forward gaze, cadence, and position of RW; chair follow   Stairs            Wheelchair Mobility    Modified Rankin (Stroke Patients Only)       Balance Overall balance assessment: No  apparent balance deficits (not formally assessed)                                  Cognition Arousal/Alertness: Awake/alert Behavior During Therapy: WFL for tasks assessed/performed;Flat affect Overall Cognitive Status: Within Functional Limits for tasks assessed                      Exercises      General Comments        Pertinent Vitals/Pain Pain Assessment: Faces Faces Pain Scale: Hurts little more Pain Location: back and legs Pain Descriptors / Indicators: Grimacing;Guarding;Sore Pain Intervention(s): Limited activity within patient's tolerance;Monitored during session;Premedicated before session;Repositioned    Home Living                      Prior Function            PT Goals (current goals can now be found in the care plan section) Acute Rehab PT Goals Patient Stated Goal: get back to doing everything Time For Goal Achievement: 10/15/15 Progress towards PT goals: Progressing toward goals    Frequency  Min 5X/week    PT Plan Current plan remains appropriate (although slower progress)    Co-evaluation             End of Session Equipment Utilized During Treatment: Back brace;Gait belt Activity Tolerance: Patient tolerated treatment well Patient left: in chair;with call bell/phone within reach;with nursing/sitter in room;with chair alarm set     Time: [id number] PT Time Calculation (min) (ACUTE ONLY): 27 min  Charges:  $Gait Training: 8-22 mins $Therapeutic Activity: 8-22 mins  G Codes:      Sal[place] April, PTA Pager: [phone]   10/10/2015, 4:00 PM

## 2015-10-10 NOTE — Significant Event (Signed)
Hypoglycemic Event  CBG: 62  Treatment: 15 GM carbohydrate snack  Symptoms: None  Follow-up CBG: Time:1713 CBG Result:115  Possible Reasons for Event: Unknown  Comments/MD notified: none at this time. Patient is asymptomatic, awake and alert, able to tolerated oral fluids and snack. Dinner is served at this time.     Sanjuana Mae

## 2015-10-10 NOTE — Progress Notes (Signed)
No issues overnight. Pt cont to report back and leg soreness. She's ambulating with PT.  EXAM:  BP 125/82 mmHg  Pulse 86  Temp(Src) 98.1 F (36.7 C) (Oral)  Resp 18  Ht 5\' 3"  (1.6 m)  Wt 70.262 kg (154 lb 14.4 oz)  BMI 27.45 kg/m2  SpO2 98%  Awake, alert, oriented  Speech fluent, appropriate  CN grossly intact  5/5 BUE/BLE   IMPRESSION:  65 y.o. female s/p lumbar fusion, progressing slowly  PLAN: - May be ready for d/c tomorrow - Cont to mobilize

## 2015-10-11 LAB — GLUCOSE, CAPILLARY
GLUCOSE-CAPILLARY: 135 mg/dL — AB (ref 65–99)
GLUCOSE-CAPILLARY: 113 mg/dL — AB (ref 65–99)
GLUCOSE-CAPILLARY: 75 mg/dL (ref 65–99)
GLUCOSE-CAPILLARY: 159 mg/dL — AB (ref 65–99)
Glucose-Capillary: 126 mg/dL — ABNORMAL HIGH (ref 65–99)
Glucose-Capillary: 162 mg/dL — ABNORMAL HIGH (ref 65–99)

## 2015-10-11 MED ORDER — OXYCODONE HCL 5 MG PO TABS
15.0000 mg | ORAL_TABLET | ORAL | Status: DC | PRN
  Administered 2015-10-11 – 2015-10-16 (×22): 15 mg via ORAL
  Filled 2015-10-11 (×24): qty 3

## 2015-10-11 MED ORDER — METHOCARBAMOL 750 MG PO TABS
750.0000 mg | ORAL_TABLET | Freq: Four times a day (QID) | ORAL | Status: DC | PRN
  Administered 2015-10-11 – 2015-10-16 (×10): 750 mg via ORAL
  Filled 2015-10-11 (×12): qty 1

## 2015-10-11 NOTE — Progress Notes (Signed)
Patient ID: Tiffany [name], female   DOB: 25-Apr-1951, 65 y.o.   MRN: [id number] Patient still with severe back and leg pain no worse than yesterday.  Awake alert oriented strength 5 out of 5 wound clean dry and intact  Mobilized with physical and occupational therapy will manipulate her medications and try to change around and at a muscle relaxer.

## 2015-10-11 NOTE — Progress Notes (Signed)
Physical Therapy Treatment Patient Details Name: Tiffany [name] MRN: [id number] DOB: [birth year] Today's Date: 10/11/2015    History of Present Illness 65 y.o. s/p Lumbar Four-Five Posterior lumbar interbody fusion. PMH includes DM, HTN, HLD, DJD, headache, previous back surgery.     PT Comments    Patient is progressing toward mobility goals but continues to demonstrate unsteadiness and shakiness with fatigue when ambulatory. Continue to progress as tolerated with anticipated d/c home with HHPT.   Follow Up Recommendations  Home health PT;Supervision for mobility/OOB     Equipment Recommendations  Rolling walker with 5" wheels    Recommendations for Other Services       Precautions / Restrictions Precautions Precautions: Fall;Back Precaution Comments: educated/reviewed back precautions. Pt able to verbalize no bending and no twising, needed cues for no arching Required Braces or Orthoses: Spinal Brace Spinal Brace: Lumbar corset;Applied in sitting position Restrictions Weight Bearing Restrictions: No    Mobility  Bed Mobility Overal bed mobility: Needs Assistance Bed Mobility: Rolling;Sidelying to Sit Rolling: Supervision Sidelying to sit: Supervision       General bed mobility comments: cues for technique and maintaining back precuations  Transfers Overall transfer level: Needs assistance Equipment used: Rolling walker (2 wheeled) Transfers: Sit to/from Stand Sit to Stand: +2 safety/equipment;Min assist         General transfer comment: from EOB, BSC, and recliner; cues for safe hand placement and technique; increased time needed and assist to power up into standing   Ambulation/Gait Ambulation/Gait assistance: Min guard Ambulation Distance (Feet): 100 Feet Assistive device: Rolling walker (2 wheeled) Gait Pattern/deviations: Step-through pattern;Decreased stride length;Trunk flexed;Narrow base of support     General Gait Details: cues for position of RW,  posture/forward gaze, and step length/height when becoming fatigued; one seated rest break needed   Stairs            Wheelchair Mobility    Modified Rankin (Stroke Patients Only)       Balance Overall balance assessment: Needs assistance Sitting-balance support: No upper extremity supported;Feet supported Sitting balance-Leahy Scale: Good     Standing balance support: During functional activity Standing balance-Leahy Scale: Fair Standing balance comment: Pt able to stand at sink for grooming tasks with no UE support                    Cognition Arousal/Alertness: Awake/alert Behavior During Therapy: WFL for tasks assessed/performed;Flat affect Overall Cognitive Status: Within Functional Limits for tasks assessed                      Exercises      General Comments        Pertinent Vitals/Pain Pain Assessment: 0-10 Pain Score:  (25) Pain Location: back and legs Pain Descriptors / Indicators: Grimacing;Guarding;Sore Pain Intervention(s): Monitored during session;Premedicated before session    Home Living                      Prior Function            PT Goals (current goals can now be found in the care plan section) Acute Rehab PT Goals Patient Stated Goal: get back to doing everything Progress towards PT goals: Progressing toward goals    Frequency  Min 5X/week    PT Plan Current plan remains appropriate    Co-evaluation PT/OT/SLP Co-Evaluation/Treatment: Yes Reason for Co-Treatment: For patient/therapist safety PT goals addressed during session: Mobility/safety with mobility;Proper use of DME OT goals addressed  during session: ADL's and self-care;Other (comment) (functional mobility)     End of Session Equipment Utilized During Treatment: Back brace;Gait belt Activity Tolerance: Patient tolerated treatment well Patient left: in chair;with call bell/phone within reach;with nursing/sitter in room;with chair alarm set      Time: [id number] PT Time Calculation (min) (ACUTE ONLY): 35 min  Charges:  $Gait Training: 8-22 mins                    G Codes:      Sal[place] April, PTA Pager: [phone]   10/11/2015, 1:20 PM

## 2015-10-11 NOTE — Progress Notes (Signed)
Occupational Therapy Treatment Patient Details Name: Tiffany [name] MRN: [id number] DOB: 1951-02-10 Today's Date: 10/11/2015    History of present illness 65 y.o. s/p Lumbar Four-Five Posterior lumbar interbody fusion. PMH includes DM, HTN, HLD, DJD, headache, previous back surgery.    OT comments  Patient making progress towards OT goals, continue plan of care for now. Pt continues to be limited by some shakiness and unsteadiness when on feet. Pt continues to need cueing to maintain back precautions during ADL and functional mobility.    Follow Up Recommendations  Home health OT;Supervision/Assistance - 24 hour    Equipment Recommendations  3 in 1 bedside comode;Other (comment) (AE, RW)    Recommendations for Other Services  None at this time   Precautions / Restrictions Precautions Precautions: Fall;Back Precaution Comments: educated/reviewed back precautions. Pt able to verbalize no bending and no twising, needed cues for no arching Required Braces or Orthoses: Spinal Brace Spinal Brace: Lumbar corset;Applied in sitting position Restrictions Weight Bearing Restrictions: No    Mobility Bed Mobility Overal bed mobility: Needs Assistance Bed Mobility: Rolling;Sidelying to Sit Rolling: Supervision Sidelying to sit: Supervision General bed mobility comments: Supervision for safety, cues to maintain back precautions and to breathe during movement/mobility   Transfers Overall transfer level: Needs assistance Equipment used: Rolling walker (2 wheeled) Transfers: Sit to/from Stand Sit to Stand: +2 safety/equipment;Min assist General transfer comment: increased time needed and cues for hand placement and technique    Balance Overall balance assessment: Needs assistance Sitting-balance support: No upper extremity supported;Feet supported Sitting balance-Leahy Scale: Good     Standing balance support: During functional activity;No upper extremity supported Standing  balance-Leahy Scale: Fair Standing balance comment: Pt able to stand at sink for grooming tasks with no UE support   ADL Overall ADL's : Needs assistance/impaired Eating/Feeding: Set up;Sitting   Grooming: Min guard;Supervision/safety;Standing;Oral care;Wash/dry face;Wash/dry Nurse, mental health Details (indicate cue type and reason): Educated pt on using two cups while brushing her teeth, one as a rinse cup and one as a spit cup Upper Body Bathing: Supervision/ safety;Set up;Sitting   Lower Body Bathing: Minimal assistance;Sit to/from stand   Upper Body Dressing : Minimal assistance;Sitting Upper Body Dressing Details (indicate cue type and reason): assistance needed for back brace Lower Body Dressing: Moderate assistance;Sit to/from stand   Toilet Transfer: Minimal assistance;Ambulation;RW Toilet Transfer Details (indicate cue type and reason): second person present for safety  Toileting- Clothing Manipulation and Hygiene: Moderate assistance;Sit to/from stand Toileting - Clothing Manipulation Details (indicate cue type and reason): cues to maintain back precautions needed      Functional mobility during ADLs: Rolling walker General ADL Comments: Pt with some shakiness and complaints of not feeling well, but was able to tolerate ambulation throughout room into BR and standing at sink for grooming tasks. Pt also ambulated in hallway with therapist and second person present for safety.     Cognition   Behavior During Therapy: WFL for tasks assessed/performed;Flat affect Overall Cognitive Status: Within Functional Limits for tasks assessed                 Pertinent Vitals/ Pain       Pain Assessment: 0-10 Pain Score:  ("25") Pain Location: back and legs Pain Descriptors / Indicators: Grimacing;Guarding;Sore Pain Intervention(s): Monitored during session;Repositioned;Premedicated before session   Frequency Min 2X/week     Progress Toward Goals  OT Goals(current goals can now  befound in the care plan section)  Progress towards OT goals: Progressing toward goals  Acute Rehab  OT Goals Patient Stated Goal: get back to doing everything OT Goal Formulation: With patient Time For Goal Achievement: 10/15/15 Potential to Achieve Goals: Good  Plan Discharge plan remains appropriate    Co-evaluation    PT/OT/SLP Co-Evaluation/Treatment: Yes Reason for Co-Treatment: For patient/therapist safety   OT goals addressed during session: ADL's and self-care;Other (comment) (functional mobility)      End of Session Equipment Utilized During Treatment: Gait belt;Rolling walker;Back brace   Activity Tolerance Patient tolerated treatment well   Patient Left in chair;with call bell/phone within reach;with chair alarm set     Time: [id number] OT Time Calculation (min): 35 min  Charges: OT General Charges $OT Visit: 1 Procedure OT Treatments $Self Care/Home Management : 8-22 mins  Chrys Racer , MS, OTR/L, CLT Pager: [phone]  10/11/2015, 12:49 PM

## 2015-10-12 LAB — GLUCOSE, CAPILLARY
GLUCOSE-CAPILLARY: 141 mg/dL — AB (ref 65–99)
GLUCOSE-CAPILLARY: 134 mg/dL — AB (ref 65–99)
Glucose-Capillary: 248 mg/dL — ABNORMAL HIGH (ref 65–99)
Glucose-Capillary: 122 mg/dL — ABNORMAL HIGH (ref 65–99)

## 2015-10-12 NOTE — Progress Notes (Addendum)
Physical Therapy Treatment Patient Details Name: Tiffany [name] MRN: [id number] DOB: 08-04-1950 Today's Date: 10/12/2015    History of Present Illness 65 y.o. s/p Lumbar Four-Five Posterior lumbar interbody fusion. PMH includes DM, HTN, HLD, DJD, headache, previous back surgery.     PT Comments    Patient continues to make progress toward mobility goals and with less c/o pain this session. Pain in bilat legs greater after ambulation but pt reported better than yesterday. Continue to progress as tolerated with anticipated d/c home with HHPT and intermittent assist from family.   Follow Up Recommendations  Home health PT;Supervision for mobility/OOB     Equipment Recommendations  Rolling walker with 5" wheels    Recommendations for Other Services       Precautions / Restrictions Precautions Precautions: Fall;Back Precaution Comments: reviewed back precautions; pt able to recall 3/3 precautions Required Braces or Orthoses: Spinal Brace Spinal Brace: Lumbar corset;Applied in sitting position Restrictions Weight Bearing Restrictions: No    Mobility  Bed Mobility Overal bed mobility: Needs Assistance Bed Mobility: Rolling;Sit to Sidelying Rolling: Supervision       Sit to sidelying: Supervision General bed mobility comments: practiced rolling side to side while maintaining back precuations; cues for technique and sequencing with supervision for safety and maintaining back precautions  Transfers Overall transfer level: Needs assistance Equipment used: Rolling walker (2 wheeled) Transfers: Sit to/from Stand Sit to Stand: Min guard         General transfer comment: from recliner and BSC; cues for hand placement; no physical assist needed  Ambulation/Gait Ambulation/Gait assistance: Supervision Ambulation Distance (Feet): 175 Feet Assistive device: Rolling walker (2 wheeled) Gait Pattern/deviations: Step-through pattern;Decreased stride length;Trunk flexed;Narrow base  of support     General Gait Details: cues for posture and cadence; pt with improved gait mechanics and activity tolerance this session   Stairs            Wheelchair Mobility    Modified Rankin (Stroke Patients Only)       Balance Overall balance assessment: Needs assistance Sitting-balance support: No upper extremity supported;Feet supported Sitting balance-Leahy Scale: Good     Standing balance support: During functional activity Standing balance-Leahy Scale: Fair                      Cognition Arousal/Alertness: Awake/alert Behavior During Therapy: WFL for tasks assessed/performed;Flat affect Overall Cognitive Status: Within Functional Limits for tasks assessed                      Exercises      General Comments        Pertinent Vitals/Pain Pain Assessment: Faces Faces Pain Scale: Hurts little more Pain Location: back and legs Pain Descriptors / Indicators: Sore;Guarding Pain Intervention(s): Limited activity within patient's tolerance;Monitored during session;Premedicated before session;Repositioned    Home Living                      Prior Function            PT Goals (current goals can now be found in the care plan section) Acute Rehab PT Goals Patient Stated Goal: get back to doing everything Progress towards PT goals: Progressing toward goals    Frequency  Min 5X/week    PT Plan Current plan remains appropriate    Co-evaluation             End of Session Equipment Utilized During Treatment: Back brace;Gait belt Activity Tolerance:  Patient tolerated treatment well Patient left: with call bell/phone within reach;in bed     Time: [id number] PT Time Calculation (min) (ACUTE ONLY): 34 min  Charges:  $Gait Training: 8-22 mins $Therapeutic Activity: 8-22 mins                    G Codes:      Salina April, PTA Pager: [phone]   10/12/2015, 9:32 AM

## 2015-10-12 NOTE — Progress Notes (Signed)
Patient ID: Tiffany [name], female   DOB: 01-Feb-1951, 65 y.o.   MRN: [id number] Patient doing better with the medication changes yesterday still condition of back and leg pain.  Ambulating well neurologically stable  Continue to work with physical occupational therapy patient is not ready for discharge hopefully in the next day or 2 with home health PT.

## 2015-10-12 NOTE — Care Management Important Message (Signed)
Important Message  Patient Details  Name: TEYONCE PRIMEAUX MRN: [id number] Date of Birth: 05/16/1951   Medicare Important Message Given:  Yes    Lacretia Leigh, RN 10/12/2015, 9:22 AM

## 2015-10-13 LAB — GLUCOSE, CAPILLARY
GLUCOSE-CAPILLARY: 174 mg/dL — AB (ref 65–99)
GLUCOSE-CAPILLARY: 157 mg/dL — AB (ref 65–99)
Glucose-Capillary: 148 mg/dL — ABNORMAL HIGH (ref 65–99)
Glucose-Capillary: 90 mg/dL (ref 65–99)

## 2015-10-13 NOTE — Progress Notes (Signed)
Physical Therapy Treatment Patient Details Name: Tiffany [name] MRN: [id number] DOB: 1950/08/04 Today's Date: 10/13/2015    History of Present Illness 65 y.o. s/p Lumbar Four-Five Posterior lumbar interbody fusion. PMH includes DM, HTN, HLD, DJD, headache, previous back surgery.     PT Comments    Patient demonstrated improved gait mechanics, increased activity tolerance, and maintained back precautions with less cues this session. Current plan remains appropriate.   Follow Up Recommendations  Home health PT;Supervision for mobility/OOB     Equipment Recommendations  Rolling walker with 5" wheels    Recommendations for Other Services       Precautions / Restrictions Precautions Precautions: Fall;Back Precaution Comments: reviewed back precautions; pt able to recall 3/3 precautions Required Braces or Orthoses: Spinal Brace Spinal Brace: Lumbar corset;Applied in sitting position Restrictions Weight Bearing Restrictions: No    Mobility  Bed Mobility Overal bed mobility: Needs Assistance Bed Mobility: Rolling;Sidelying to Sit Rolling: Supervision Sidelying to sit: Supervision       General bed mobility comments: supervision for saftey; carry over of sequencing and technique  Transfers Overall transfer level: Needs assistance Equipment used: Rolling walker (2 wheeled) Transfers: Sit to/from Stand Sit to Stand: Supervision         General transfer comment: from EOB and BSC; cues for safe hand placement first trial with safe technique demonstrated  Ambulation/Gait Ambulation/Gait assistance: Supervision Ambulation Distance (Feet): 250 Feet Assistive device: Rolling walker (2 wheeled) Gait Pattern/deviations: Step-through pattern;Decreased stride length     General Gait Details: pt with increased cadence and improved upright posture without need for cues; safe use of AD; cues for technique to  maintain back precautions when turning with pt tending to  twist   Stairs            Wheelchair Mobility    Modified Rankin (Stroke Patients Only)       Balance     Sitting balance-Leahy Scale: Good     Standing balance support: No upper extremity supported Standing balance-Leahy Scale: Fair Standing balance comment: static stand                    Cognition Arousal/Alertness: Awake/alert Behavior During Therapy: WFL for tasks assessed/performed;Flat affect Overall Cognitive Status: Within Functional Limits for tasks assessed                      Exercises      General Comments        Pertinent Vitals/Pain Pain Assessment: Faces Faces Pain Scale: Hurts little more Pain Location: bilat LE Pain Descriptors / Indicators: Sore;Aching Pain Intervention(s): Limited activity within patient's tolerance;Monitored during session;Repositioned;Premedicated before session    Home Living                      Prior Function            PT Goals (current goals can now be found in the care plan section) Acute Rehab PT Goals Patient Stated Goal: get back to doing everything Progress towards PT goals: Progressing toward goals    Frequency  Min 5X/week    PT Plan Current plan remains appropriate    Co-evaluation             End of Session Equipment Utilized During Treatment: Back brace;Gait belt Activity Tolerance: Patient tolerated treatment well Patient left: with call bell/phone within reach;in chair     Time: 1410-1436 PT Time Calculation (min) (ACUTE ONLY): 26 min  Charges:  $Gait Training: 8-22 mins $Therapeutic Activity: 8-22 mins                    G Codes:      Salina April, PTA Pager: [phone]   10/13/2015, 2:43 PM

## 2015-10-13 NOTE — Progress Notes (Signed)
Occupational Therapy Treatment and Discharge Patient Details Name: Tiffany [name] MRN: [id number] DOB: November 11, 1950 Today's Date: 10/13/2015    History of present illness 65 y.o. s/p Lumbar Four-Five Posterior lumbar interbody fusion. PMH includes DM, HTN, HLD, DJD, headache, previous back surgery.    OT comments  Pt able to meet most OT goals this session and meet all goals adequately for d/c from acute OT services. Pt currently overall supervision with ADLs and functional mobility with the exception of min assist for LB ADLs. Pt reports family or friends can assist with LB ADLs upon return home. Pt able to maintain back precautions throughout all activities and don/doff brace with set up. Feel that [facility] would still be appropriate for pt, however, no further acute OT needs identified. Signing off at this time. Please re-consult if needs change.    Follow Up Recommendations  Home health OT;Supervision/Assistance - 24 hour    Equipment Recommendations  3 in 1 bedside comode;Other (comment) (AE)    Recommendations for Other Services      Precautions / Restrictions Precautions Precautions: Fall;Back Precaution Comments: Pt able to verbally recall 3/3 back precautions and maintain throuhout session Required Braces or Orthoses: Spinal Brace Spinal Brace: Lumbar corset;Applied in sitting position Restrictions Weight Bearing Restrictions: No       Mobility Bed Mobility Overal bed mobility: Needs Assistance Bed Mobility: Rolling;Sit to Sidelying Rolling: Supervision Sidelying to sit: Supervision     Sit to sidelying: Supervision General bed mobility comments: Supervision for safety; no physical assist. HOB flat without use of bed rail.  Transfers Overall transfer level: Needs assistance Equipment used: Rolling walker (2 wheeled) Transfers: Sit to/from Stand Sit to Stand: Supervision         General transfer comment: Supervision for safety; no physical assist. Good hand  placement and technique.    Balance Overall balance assessment: Needs assistance Sitting-balance support: No upper extremity supported;Feet supported Sitting balance-Leahy Scale: Good     Standing balance support: No upper extremity supported;During functional activity Standing balance-Leahy Scale: Good Standing balance comment: static stand                   ADL Overall ADL's : Needs assistance/impaired     Grooming: Supervision/safety;Standing;Oral care;Wash/dry hands;Applying deodorant   Upper Body Bathing: Set up;Sitting   Lower Body Bathing: Minimal assistance;Sit to/from stand Lower Body Bathing Details (indicate cue type and reason): Assist for washing below knees and bottom. Pt reports someone will be available to assist upon return home. Upper Body Dressing : Set up;Sitting Upper Body Dressing Details (indicate cue type and reason): To doff/don hospital gown and back brace Lower Body Dressing: Minimal assistance;Sit to/from stand Lower Body Dressing Details (indicate cue type and reason): To doff/don socks Toilet Transfer: Supervision/safety;Ambulation;BSC;RW (BSC over toilet)   Toileting- Clothing Manipulation and Hygiene: Supervision/safety;Sitting/lateral lean Toileting - Clothing Manipulation Details (indicate cue type and reason): for toilet hygiene     Functional mobility during ADLs: Supervision/safety;Rolling walker General ADL Comments: Pt maintaining back precautions thorughout session. Reports friends or family can assist with bathing and dressing at home.       Vision                     Perception     Praxis      Cognition   Behavior During Therapy: [facility] for tasks assessed/performed;Flat affect Overall Cognitive Status: Within Functional Limits for tasks assessed  Extremity/Trunk Assessment               Exercises     Shoulder Instructions       General Comments      Pertinent Vitals/  Pain       Pain Assessment: Faces Faces Pain Scale: Hurts little more Pain Location: back and legs Pain Descriptors / Indicators: Sore Pain Intervention(s): Monitored during session;Repositioned  Home Living                                          Prior Functioning/Environment              Frequency       Progress Toward Goals  OT Goals(current goals can now be found in the care plan section)  Progress towards OT goals: Goals met/education completed, patient discharged from OT  Acute Rehab OT Goals Patient Stated Goal: get back to doing everything OT Goal Formulation: All assessment and education complete, DC therapy  Plan Discharge plan remains appropriate;All goals met and education completed, patient discharged from OT services    Co-evaluation                 End of Session Equipment Utilized During Treatment: Back brace;Rolling walker   Activity Tolerance Patient tolerated treatment well   Patient Left in bed;with call bell/phone within reach   Nurse Communication          Time: 5465-6812 OT Time Calculation (min): 23 min  Charges: OT General Charges $OT Visit: 1 Procedure OT Treatments $Self Care/Home Management : 23-37 mins  Binnie Kand M.S., OTR/L Pager: [phone]  10/13/2015, 4:22 PM

## 2015-10-14 LAB — GLUCOSE, CAPILLARY
GLUCOSE-CAPILLARY: 159 mg/dL — AB (ref 65–99)
GLUCOSE-CAPILLARY: 221 mg/dL — AB (ref 65–99)
GLUCOSE-CAPILLARY: 120 mg/dL — AB (ref 65–99)
Glucose-Capillary: 120 mg/dL — ABNORMAL HIGH (ref 65–99)

## 2015-10-14 NOTE — Progress Notes (Signed)
Patient ID: Tiffany [name], female   DOB: 23-Apr-1951, 65 y.o.   MRN: [id number] Complaining of pain. No weakness. Wound dry. Continue pt

## 2015-10-15 LAB — GLUCOSE, CAPILLARY
GLUCOSE-CAPILLARY: 88 mg/dL (ref 65–99)
Glucose-Capillary: 144 mg/dL — ABNORMAL HIGH (ref 65–99)
Glucose-Capillary: 207 mg/dL — ABNORMAL HIGH (ref 65–99)
Glucose-Capillary: 317 mg/dL — ABNORMAL HIGH (ref 65–99)

## 2015-10-15 NOTE — Progress Notes (Signed)
Patient ID: Tiffany [name], female   DOB: 10/19/1950, 65 y.o.   MRN: [id number] Still with a lot of pain wound dry. No weakness. Wants to wait for dr Kathyrn Sheriff

## 2015-10-16 LAB — GLUCOSE, CAPILLARY
Glucose-Capillary: 158 mg/dL — ABNORMAL HIGH (ref 65–99)
Glucose-Capillary: 256 mg/dL — ABNORMAL HIGH (ref 65–99)
Glucose-Capillary: 70 mg/dL (ref 65–99)

## 2015-10-16 MED ORDER — GABAPENTIN 300 MG PO CAPS: 300.0000 mg | ORAL_CAPSULE | Freq: Three times a day (TID) | ORAL | Status: DC

## 2015-10-16 MED ORDER — METHOCARBAMOL 750 MG PO TABS: 750.0000 mg | ORAL_TABLET | Freq: Four times a day (QID) | ORAL | Status: DC | PRN

## 2015-10-16 MED ORDER — OXYCODONE HCL 15 MG PO TABS: 15.0000 mg | ORAL_TABLET | ORAL | Status: DC | PRN

## 2015-10-16 NOTE — Progress Notes (Signed)
No issues overnight. Pt cont to report the same burning-type dysesthetic pain in her legs. Back pain cont to improve.  EXAM:  BP 129/71 mmHg  Pulse 76  Temp(Src) 98.5 F (36.9 C) (Oral)  Resp 20  Ht 5\' 3"  (1.6 m)  Wt 70.262 kg (154 lb 14.4 oz)  BMI 27.45 kg/m2  SpO2 99%  Awake, alert, oriented  Speech fluent, appropriate  CN grossly intact  5/5 BUE/BLE   IMPRESSION:  65 y.o. female s/p lumbar fusion, has some dysesthetic leg pain. Doing well with therapy. Ready for d/c.   PLAN: - Will d/c today - Cont Oxycodone 15mg , Robaxin 750mg , and Neurontin 300mg  TID at home - Home health PT/OT

## 2015-10-16 NOTE — Discharge Summary (Signed)
Physician Discharge Summary  Patient ID: Tiffany [name] MRN: [id number] DOB/AGE: [birth year] 64 y.o.  Admit date: 10/06/2015 Discharge date: 10/16/2015  Admission Diagnoses: Spondylolisthesis, L4-5  Discharge Diagnoses: Same Active Problems:   Spondylolisthesis at L4-L5 level   Discharged Condition: Stable  Hospital Course:  [name] is a 65 y.o. female electively admitted after lumbar fusion. She was at neurologic baseline postop, but was c/o dysesthetic leg pain. This was somewhat controlled with neurontin. She was evaluated but PT/OT and continued to make progress. She was ready for d/c with home health therapy, voiding normally, tolerating diet.  Treatments: Surgery - L4-5 PLIF  Discharge Exam: Blood pressure 129/71, pulse 76, temperature 98.5 F (36.9 C), temperature source Oral, resp. rate 20, height _0  (1.6 m), weight 70.262 kg (154 lb 14.4 oz), SpO2 99 %. Awake, alert, oriented Speech fluent, appropriate CN grossly intact 5/5 BUE/BLE Wound c/d/i  Disposition: 01-Home or Self Care  Discharge Instructions    Ambulatory referral to [facility]    Complete by:  As directed   Please evaluate [name] for admission to [facility].  Disciplines requested: Physical Therapy and Occupational Therapy  Services to provide: Strengthening Exercises and Evaluate  Physician to follow patient's care (the person listed here will be responsible for signing ongoing orders): Referring Provider  Requested Start of Care Date: Tomorrow  I certify that this patient is under my care and that I, or a Nurse Practitioner or Physician's Assistant working with me, had a face-to-face encounter that meets the physician face-to-face requirements with patient on 10/16/15. The encounter with the patient was in whole, or in part for the following medical condition(s) which is the primary reason for home health care (List medical condition). Lumbar spondylosis  Does the patient have  Medicare or Medicaid?:  No  The encounter with the patient was in whole, or in part, for the following medical condition, which is the primary reason for home health care:  lumbar spondylosis  Reason for Medically Necessary Home Health Services:  Therapy- Therapeutic Exercises to Increase Strength and Endurance  My clinical findings support the need for the above services:  Unable to leave home safely without assistance and/or assistive device  I certify that, based on my findings, the following services are medically necessary home health services:  Physical therapy  Further, I certify that my clinical findings support that this patient is homebound due to:  Pain interferes with ambulation/mobility            Medication List    STOP taking these medications        acetaminophen-codeine 300-30 MG tablet  Commonly known as:  TYLENOL #3     oxyCODONE-acetaminophen 5-325 MG tablet  Commonly known as:  PERCOCET      TAKE these medications        ACCU-CHEK NANO SMARTVIEW w/Device Kit  Use to check blood sugar as directed up to 3 times a day. diag code 250.02. Insulin dependent     amLODipine 5 MG tablet  Commonly known as:  NORVASC  Take 1 tablet (5 mg total) by mouth daily.     gabapentin 300 MG capsule  Commonly known as:  NEURONTIN  Take 1 capsule (300 mg total) by mouth 3 (three) times daily.     glimepiride 4 MG tablet  Commonly known as:  AMARYL  Take 1 tablet (4 mg total) by mouth daily with breakfast.     glucosamine-chondroitin 500-400 MG tablet  Commonly  known as:  MAX GLUCOSAMINE CHONDROITIN  Take 1 tablet by mouth 3 (three) times daily.     glucose blood test strip  Commonly known as:  AGAMATRIX PRESTO TEST  Use as instructed     Insulin Pen Needle 32G X 4 MM Misc  Use to inject  lantus once daily     LANTUS SOLOSTAR 100 UNIT/ML Solostar Pen  Generic drug:  Insulin Glargine  Inject 36 Units into the skin at bedtime.     methocarbamol 750 MG tablet   Commonly known as:  ROBAXIN  Take 1 tablet (750 mg total) by mouth every 6 (six) hours as needed for muscle spasms.     oxyCODONE 15 MG immediate release tablet  Commonly known as:  ROXICODONE  Take 1 tablet (15 mg total) by mouth every 4 (four) hours as needed for breakthrough pain.     quinapril 40 MG tablet  Commonly known as:  ACCUPRIL  Take 1 tablet (40 mg total) by mouth daily.     ranitidine 150 MG tablet  Commonly known as:  ZANTAC  Take 150 mg by mouth 2 (two) times daily as needed for heartburn.     zolpidem 10 MG tablet  Commonly known as:  AMBIEN  Take 1 tablet (10 mg total) by mouth at bedtime as needed.           Follow-up Information    Follow up with [facility], Marinda Tyer, C, MD In 2 weeks.   Specialty:  Neurosurgery   Contact information:   [address] [place] [address] [phone]       Signed: Consuella Lose, Loletha Grayer 10/16/2015, 1:20 PM

## 2015-10-16 NOTE — Progress Notes (Signed)
Physical Therapy Treatment Patient Details Name: Tiffany [name] MRN: [id number] DOB: 07-03-1950 Today's Date: 10/16/2015    History of Present Illness 65 y.o. s/p Lumbar Four-Five Posterior lumbar interbody fusion. PMH includes DM, HTN, HLD, DJD, headache, previous back surgery.     PT Comments    Pt doing well with RW and worked on decreasing reliance on it with decreased UE WB.  Recommend trial with cane for next session. Pt did need cues for no twisting at various times during session.  Follow Up Recommendations  Home health PT;Supervision for mobility/OOB     Equipment Recommendations  Rolling walker with 5" wheels    Recommendations for Other Services       Precautions / Restrictions Precautions Precautions: Fall;Back Precaution Comments: recalled 3/3 back precautions, but needed cueing to avoid twisting on multiple occasions during session Required Braces or Orthoses: Spinal Brace Spinal Brace: Lumbar corset;Applied in sitting position Restrictions Weight Bearing Restrictions: No    Mobility  Bed Mobility Overal bed mobility: Modified Independent Bed Mobility: Sit to Sidelying         Sit to sidelying: Modified independent (Device/Increase time) General bed mobility comments: good technique with sit >sidelying  Transfers Overall transfer level: Needs assistance Equipment used: Rolling walker (2 wheeled) Transfers: Sit to/from Stand Sit to Stand: Supervision         General transfer comment: Stood from recliner and toilet 2x.  Cues at toilet to avoid twisting  Ambulation/Gait Ambulation/Gait assistance: Supervision Ambulation Distance (Feet): 600 Feet Assistive device: Rolling walker (2 wheeled) Gait Pattern/deviations: Step-through pattern     General Gait Details: Pt c/o R LE pain with gait.  Pt with good technique with RW.  Pt did not feel she could attempt gait with 1 UE A due to R LE pain.  Facililtated ambulation with use fo RW and decreasing  reliance on it with decreased UE WB.   Stairs            Wheelchair Mobility    Modified Rankin (Stroke Patients Only)       Balance Overall balance assessment: No apparent balance deficits (not formally assessed)                                  Cognition Arousal/Alertness: Awake/alert Behavior During Therapy: WFL for tasks assessed/performed Overall Cognitive Status: Within Functional Limits for tasks assessed                      Exercises      General Comments        Pertinent Vitals/Pain Pain Assessment: 0-10 Pain Score: 6  Pain Location: R thigh Pain Descriptors / Indicators: Sore Pain Intervention(s): Monitored during session;Limited activity within patient's tolerance    Home Living                      Prior Function            PT Goals (current goals can now be found in the care plan section) Acute Rehab PT Goals Patient Stated Goal: get back to doing everything PT Goal Formulation: With patient Time For Goal Achievement: 10/19/15 Potential to Achieve Goals: Good Progress towards PT goals: Progressing toward goals    Frequency  Min 5X/week    PT Plan Current plan remains appropriate    Co-evaluation             End  of Session Equipment Utilized During Treatment: Back brace Activity Tolerance: Patient tolerated treatment well Patient left: in bed;with call bell/phone within reach;with bed alarm set     Time: [id number] PT Time Calculation (min) (ACUTE ONLY): 22 min  Charges:  $Gait Training: 8-22 mins                    G Codes:      Karl Knarr LUBECK 10/16/2015, 9:05 AM

## 2015-10-16 NOTE — Progress Notes (Signed)
Discharge instructions reviewed with patient.. All questions answered at this time. RXs given to patient. Side effects of pain medication and BP med reviewed. Awaiting for son to pick patient up.   Ave Filter, RN

## 2015-10-16 NOTE — Progress Notes (Signed)
Inpatient Diabetes Program Recommendations  AACE/ADA: New Consensus Statement on Inpatient Glycemic Control (2015)  Target Ranges:  Prepandial:   less than 140 mg/dL      Peak postprandial:   less than 180 mg/dL (1-2 hours)      Critically ill patients:  140 - 180 mg/dL  Results for Tiffany [name], Tiffany [name] (MRN [id number]) as of 10/16/2015 11:16  Ref. Range 10/15/2015 06:09 10/15/2015 11:29 10/15/2015 16:24 10/15/2015 21:42 10/16/2015 06:23  Glucose-Capillary Latest Ref Range: 65-99 mg/dL 144 (H) 207 (H) 317 (H) 88 158 (H)   Review of Glycemic Control  Diabetes history: DM 2 Outpatient Diabetes medications: Amaryl 4 mg Daily, Lantus 36 units QHS Current orders for Inpatient glycemic control: Lantus 24, Amaryl 4 mg Daily, Novolog Moderate TID  Inpatient Diabetes Program Recommendations: Insulin - Meal Coverage: Glucose increased to the 200-300 range around meals. Please consider starting Novolog 4-5 units TID meal coverage in addition to correction scale if patient consumes at least 50% of meals.   Thanks,  Tama Headings RN, MSN, [facility] Inpatient Diabetes Coordinator Team Pager [phone] (8a-5p)

## 2015-10-16 NOTE — Care Management Note (Signed)
Case Management Note  Patient Details  Name: Tiffany [name] MRN: [id number] Date of Birth: 28-Dec-1950  Subjective/Objective:                    Action/Plan: Patient discharging home today with orders for [facility] services. CM met with the patient and provided her a list of [facility] agencies in the [place] area. She selected [place]. Tiffany with [facility] notified and accepted the referral. Pt also with orders for walker and 3 in 1. Jermaine with [facility] notified and will deliver the equipment to the room. Will update the bedside RN.  Expected Discharge Date:                  Expected Discharge Plan:  [place]  In-House Referral:     Discharge planning Services  CM Consult  Post Acute Care Choice:  Durable Medical Equipment, Home Health Choice offered to:  Patient  DME Arranged:  3-N-1, Walker rolling DME Agency:  [place]:  PT, OT [facility] Agency:  [place]  Status of Service:  Completed, signed off  Medicare Important Message Given:  Yes Date Medicare IM Given:    Medicare IM give by:    Date Additional Medicare IM Given:    Additional Medicare Important Message give by:     If discussed at [place] of Stay Meetings, dates discussed:    Additional Comments:  Pollie Friar, RN 10/16/2015, 2:35 PM

## 2015-10-17 NOTE — Care Management Important Message (Signed)
Important Message  Patient Details  Name: Tiffany [name] MRN: [id number] Date of Birth: [birth year]   Medicare Important Message Given:  Yes    Rilea Arutyunyan, Leroy Sea 10/17/2015, 1:25 PM

## 2015-10-18 ENCOUNTER — Encounter (HOSPITAL_COMMUNITY): Payer: Self-pay | Admitting: Neurosurgery

## 2015-10-18 DIAGNOSIS — Z4789 Encounter for other orthopedic aftercare: Secondary | ICD-10-CM | POA: Diagnosis not present

## 2015-10-18 DIAGNOSIS — Z794 Long term (current) use of insulin: Secondary | ICD-10-CM | POA: Diagnosis not present

## 2015-10-18 DIAGNOSIS — M150 Primary generalized (osteo)arthritis: Secondary | ICD-10-CM | POA: Diagnosis not present

## 2015-10-18 DIAGNOSIS — Z87891 Personal history of nicotine dependence: Secondary | ICD-10-CM | POA: Diagnosis not present

## 2015-10-18 DIAGNOSIS — E119 Type 2 diabetes mellitus without complications: Secondary | ICD-10-CM | POA: Diagnosis not present

## 2015-10-18 DIAGNOSIS — E785 Hyperlipidemia, unspecified: Secondary | ICD-10-CM | POA: Diagnosis not present

## 2015-10-18 DIAGNOSIS — Z7984 Long term (current) use of oral hypoglycemic drugs: Secondary | ICD-10-CM | POA: Diagnosis not present

## 2015-10-18 DIAGNOSIS — I10 Essential (primary) hypertension: Secondary | ICD-10-CM | POA: Diagnosis not present

## 2015-10-26 DIAGNOSIS — M4316 Spondylolisthesis, lumbar region: Secondary | ICD-10-CM | POA: Diagnosis not present

## 2015-10-26 DIAGNOSIS — Z6825 Body mass index (BMI) 25.0-25.9, adult: Secondary | ICD-10-CM | POA: Diagnosis not present

## 2015-11-02 DIAGNOSIS — E1165 Type 2 diabetes mellitus with hyperglycemia: Secondary | ICD-10-CM | POA: Diagnosis not present

## 2015-11-03 ENCOUNTER — Encounter (HOSPITAL_COMMUNITY): Payer: Self-pay | Admitting: Neurosurgery

## 2015-11-03 DIAGNOSIS — E1165 Type 2 diabetes mellitus with hyperglycemia: Secondary | ICD-10-CM | POA: Diagnosis not present

## 2015-11-29 ENCOUNTER — Other Ambulatory Visit: Payer: Self-pay | Admitting: Family

## 2015-12-02 ENCOUNTER — Other Ambulatory Visit: Payer: Self-pay | Admitting: Internal Medicine

## 2015-12-04 NOTE — Telephone Encounter (Signed)
Sent to pharmacy 

## 2016-01-04 ENCOUNTER — Other Ambulatory Visit: Payer: Self-pay | Admitting: Family

## 2016-01-04 DIAGNOSIS — I10 Essential (primary) hypertension: Secondary | ICD-10-CM

## 2016-01-19 DIAGNOSIS — E1165 Type 2 diabetes mellitus with hyperglycemia: Secondary | ICD-10-CM | POA: Diagnosis not present

## 2016-02-01 ENCOUNTER — Other Ambulatory Visit: Payer: Self-pay | Admitting: Family

## 2016-02-12 ENCOUNTER — Ambulatory Visit (INDEPENDENT_AMBULATORY_CARE_PROVIDER_SITE_OTHER): Payer: Medicare Other

## 2016-02-12 ENCOUNTER — Ambulatory Visit (HOSPITAL_COMMUNITY)
Admission: EM | Admit: 2016-02-12 | Discharge: 2016-02-12 | Disposition: A | Discharge status: Home / Self Care | Payer: Medicare Other | Attending: Emergency Medicine | Admitting: Emergency Medicine

## 2016-02-12 ENCOUNTER — Encounter (HOSPITAL_COMMUNITY): Payer: Self-pay | Admitting: Family Medicine

## 2016-02-12 DIAGNOSIS — S92911A Unspecified fracture of right toe(s), initial encounter for closed fracture: Secondary | ICD-10-CM | POA: Diagnosis not present

## 2016-02-12 DIAGNOSIS — M79671 Pain in right foot: Secondary | ICD-10-CM | POA: Diagnosis not present

## 2016-02-12 DIAGNOSIS — S92511A Displaced fracture of proximal phalanx of right lesser toe(s), initial encounter for closed fracture: Secondary | ICD-10-CM | POA: Diagnosis not present

## 2016-02-12 NOTE — ED Triage Notes (Signed)
Pt here for right foot pain after hitting it on the door frame yesterday. brusing noted. sts more on the top of foot around toes. Denies ankle pain.

## 2016-02-12 NOTE — Discharge Instructions (Signed)
Use ice to the end of the foot and toes. Keep elevated. Limit weightbearing. Wear the hard sole shoe and keep the toes buddy taped. Use the cane to help with off weight.

## 2016-02-12 NOTE — ED Provider Notes (Signed)
CSN: [id number]     Arrival date & time 02/12/16  1247 History   First MD Initiated Contact with Patient 02/12/16 1504     Chief Complaint  Patient presents with  . Foot Pain   (Consider location/radiation/quality/duration/timing/severity/associated sxs/prior Treatment) 65 year old female was walking into the bathroom yesterday and struck her right foot onto the door jam. She suffered pain to the toes and forefoot. She is currently walking with a cane. Denies other injury. Denies fall.    Past Medical History:  Diagnosis Date  . Degenerative joint disease   . Diabetes mellitus   . Headache   . Hyperlipidemia   . Hypertension   . Sleep apnea    stopped using it   Past Surgical History:  Procedure Laterality Date  . ABDOMINAL HYSTERECTOMY  1976  . BACK SURGERY    . KNEE ARTHROSCOPY     left x 2  . LUMBAR LAMINECTOMY  1989   Family History  Problem Relation Age of Onset  . Hypertension Mother   . Diabetes Mother   . Thyroid disease Mother   . Heart disease Mother   . Kidney disease Mother   . Diabetes Sister   . Hypertension Brother   . Stroke Brother   . Heart disease Brother   . Colon cancer Neg Hx   . Colon polyps Neg Hx   . Rectal cancer Neg Hx   . Stomach cancer Neg Hx    Social History  Substance Use Topics  . Smoking status: Former Smoker    Packs/day: 0.50    Years: 15.00    Quit date: 08/02/1990  . Smokeless tobacco: Never Used  . Alcohol use No   OB History    No data available     Review of Systems  Constitutional: Negative.   HENT: Negative.   Respiratory: Negative.   Gastrointestinal: Negative.   Musculoskeletal:       As per history of present illness  Skin: Negative.   Neurological: Negative.   All other systems reviewed and are negative.   Allergies  Metformin and related; Aspirin; Contrast media [iodinated diagnostic agents]; Iodine; Shellfish allergy; Penicillins; and Tramadol  Home Medications   Prior to Admission medications    Medication Sig Start Date End Date Taking? Authorizing Provider  amLODipine (NORVASC) 5 MG tablet TAKE 1 TABLET (5 MG TOTAL) BY MOUTH DAILY. 11/29/15   Golden Circle, FNP  Blood Glucose Monitoring Suppl (ACCU-CHEK NANO SMARTVIEW) W/DEVICE KIT Use to check blood sugar as directed up to 3 times a day. diag code 250.02. Insulin dependent 08/25/13   Jones Bales, MD  gabapentin (NEURONTIN) 300 MG capsule Take 1 capsule (300 mg total) by mouth 3 (three) times daily. 10/16/15   Consuella Lose, MD  glimepiride (AMARYL) 4 MG tablet TAKE 1 TABLET (4 MG TOTAL) BY MOUTH DAILY WITH BREAKFAST. 02/01/16   Golden Circle, FNP  glucosamine-chondroitin (MAX GLUCOSAMINE CHONDROITIN) 500-400 MG tablet Take 1 tablet by mouth 3 (three) times daily. 12/30/13   Jones Bales, MD  glucose blood (AGAMATRIX PRESTO TEST) test strip Use as instructed 05/08/15   Golden Circle, FNP  Insulin Glargine (LANTUS SOLOSTAR) 100 UNIT/ML Solostar Pen Inject 36 Units into the skin at bedtime.     Historical Provider, MD  Insulin Pen Needle 32G X 4 MM MISC Use to inject  lantus once daily 11/18/14   Golden Circle, FNP  methocarbamol (ROBAXIN) 750 MG tablet Take 1 tablet (750 mg total) by mouth  every 6 (six) hours as needed for muscle spasms. 10/16/15   Consuella Lose, MD  oxyCODONE (ROXICODONE) 15 MG immediate release tablet Take 1 tablet (15 mg total) by mouth every 4 (four) hours as needed for breakthrough pain. 10/16/15   Consuella Lose, MD  quinapril (ACCUPRIL) 40 MG tablet TAKE 1 TABLET (40 MG TOTAL) BY MOUTH DAILY. 01/04/16   Golden Circle, FNP  ranitidine (ZANTAC) 150 MG tablet Take 150 mg by mouth 2 (two) times daily as needed for heartburn.    Historical Provider, MD  zolpidem (AMBIEN) 10 MG tablet TAKE ONE TABLET BY MOUTH AT BEDTIME AS NEEDED 12/04/15   Hoyt Koch, MD   Meds Ordered and Administered this Visit  Medications - No data to display  BP 165/86 (BP Location: Left Arm)   Pulse 84   Temp  98.9 F (37.2 C) (Oral)   Resp 12   SpO2 100%  No data found.   Physical Exam  Constitutional: She is oriented to person, place, and time. She appears well-developed and well-nourished. No distress.  HENT:  Head: Normocephalic and atraumatic.  Neck: Normal range of motion.  Cardiovascular: Normal rate.   Pulmonary/Chest: Effort normal.  Musculoskeletal:  Right forefoot with minor swelling and discoloration. Tenderness to the base of the third fourth and fifth toes. No deformity. No tenderness to the proximal foot or ankle. No bony tenderness. Able to bear weight and ambulate with a cane.  Neurological: She is alert and oriented to person, place, and time.  Skin: Skin is warm and dry.  Nursing note and vitals reviewed.   Urgent Care Course   Clinical Course    Procedures (including critical care time)  Labs Review Labs Reviewed - No data to display  Imaging Review Dg Foot Complete Right  Result Date: 02/12/2016 CLINICAL DATA:  Hit foot on door frame yesterday. Pain third through fifth digits. Diabetic. EXAM: RIGHT FOOT COMPLETE - 3+ VIEW COMPARISON:  None. FINDINGS: There are fractures through the fourth and fifth proximal phalanges, minimally displaced. No intra-articular extension. No subluxation or dislocation. IMPRESSION: Oblique minimally displaced fractures through the right fourth and fifth proximal phalanges. Electronically Signed   By: Rolm Baptise M.D.   On: 02/12/2016 15:10     Visual Acuity Review  Right Eye Distance:   Left Eye Distance:   Bilateral Distance:    Right Eye Near:   Left Eye Near:    Bilateral Near:         MDM   1. Foot pain, right   2. Toe fracture, right, closed, initial encounter    Use ice to the end of the foot and toes. Keep elevated. Limit weightbearing. Wear the hard sole shoe and keep the toes buddy taped. Use the cane to help with off weight.     Janne Napoleon, NP 02/12/16 [phone]

## 2016-02-20 ENCOUNTER — Other Ambulatory Visit: Payer: Self-pay | Admitting: Internal Medicine

## 2016-02-28 DIAGNOSIS — G8929 Other chronic pain: Secondary | ICD-10-CM | POA: Diagnosis not present

## 2016-02-28 DIAGNOSIS — Z6825 Body mass index (BMI) 25.0-25.9, adult: Secondary | ICD-10-CM | POA: Diagnosis not present

## 2016-02-28 DIAGNOSIS — I10 Essential (primary) hypertension: Secondary | ICD-10-CM | POA: Diagnosis not present

## 2016-02-28 DIAGNOSIS — M545 Low back pain: Secondary | ICD-10-CM | POA: Diagnosis not present

## 2016-03-04 ENCOUNTER — Other Ambulatory Visit: Payer: Self-pay | Admitting: Neurosurgery

## 2016-03-04 DIAGNOSIS — M545 Low back pain: Principal | ICD-10-CM

## 2016-03-04 DIAGNOSIS — G8929 Other chronic pain: Secondary | ICD-10-CM

## 2016-03-06 ENCOUNTER — Other Ambulatory Visit (INDEPENDENT_AMBULATORY_CARE_PROVIDER_SITE_OTHER): Payer: Medicare Other

## 2016-03-06 ENCOUNTER — Ambulatory Visit (INDEPENDENT_AMBULATORY_CARE_PROVIDER_SITE_OTHER): Payer: Medicare Other | Admitting: Family

## 2016-03-06 ENCOUNTER — Encounter: Payer: Self-pay | Admitting: Family

## 2016-03-06 VITALS — BP 138/92 | HR 100 | Temp 98.4°F | Resp 16 | Ht 63.0 in | Wt 141.0 lb

## 2016-03-06 DIAGNOSIS — E1165 Type 2 diabetes mellitus with hyperglycemia: Secondary | ICD-10-CM | POA: Diagnosis not present

## 2016-03-06 DIAGNOSIS — IMO0001 Reserved for inherently not codable concepts without codable children: Secondary | ICD-10-CM

## 2016-03-06 DIAGNOSIS — I10 Essential (primary) hypertension: Secondary | ICD-10-CM

## 2016-03-06 DIAGNOSIS — Z794 Long term (current) use of insulin: Secondary | ICD-10-CM

## 2016-03-06 DIAGNOSIS — Z23 Encounter for immunization: Secondary | ICD-10-CM

## 2016-03-06 DIAGNOSIS — Z0000 Encounter for general adult medical examination without abnormal findings: Secondary | ICD-10-CM

## 2016-03-06 DIAGNOSIS — E782 Mixed hyperlipidemia: Secondary | ICD-10-CM

## 2016-03-06 LAB — COMPREHENSIVE METABOLIC PANEL
ALBUMIN: 4.1 g/dL (ref 3.5–5.2)
ALK PHOS: 79 U/L (ref 39–117)
ALT: 17 U/L (ref 0–35)
AST: 14 U/L (ref 0–37)
BUN: 11 mg/dL (ref 6–23)
CHLORIDE: 101 meq/L (ref 96–112)
CO2: 26 mEq/L (ref 19–32)
CREATININE: 0.74 mg/dL (ref 0.40–1.20)
Calcium: 9.8 mg/dL (ref 8.4–10.5)
GFR: 101.4 mL/min (ref >60.00)
GLUCOSE: 175 mg/dL — AB (ref 70–99)
Potassium: 4.7 mEq/L (ref 3.5–5.1)
SODIUM: 137 meq/L (ref 135–145)
TOTAL PROTEIN: 8.9 g/dL — AB (ref 6.0–8.3)
Total Bilirubin: 0.5 mg/dL (ref 0.2–1.2)

## 2016-03-06 LAB — LIPID PANEL
CHOLESTEROL: 239 mg/dL — AB (ref 0–200)
HDL: 57.8 mg/dL (ref >39.00)
LDL CALC: 170 mg/dL — AB (ref 0–99)
NONHDL: 181.63
Total CHOL/HDL Ratio: 4
Triglycerides: 59 mg/dL (ref 0.0–149.0)
VLDL: 11.8 mg/dL (ref 0.0–40.0)

## 2016-03-06 LAB — MICROALBUMIN / CREATININE URINE RATIO
Creatinine,U: 175.6 mg/dL
MICROALB/CREAT RATIO: 1.7 mg/g (ref 0.0–30.0)
Microalb, Ur: 3 mg/dL — ABNORMAL HIGH (ref 0.0–1.9)

## 2016-03-06 LAB — CBC
HCT: 41 % (ref 36.0–46.0)
Hemoglobin: 13.9 g/dL (ref 12.0–15.0)
MCHC: 33.9 g/dL (ref 30.0–36.0)
MCV: 85.6 fl (ref 78.0–100.0)
Platelets: 282 10*3/uL (ref 150.0–400.0)
RBC: 4.79 Mil/uL (ref 3.87–5.11)
RDW: 14.5 % (ref 11.5–15.5)
WBC: 5.7 10*3/uL (ref 4.0–10.5)

## 2016-03-06 LAB — HEMOGLOBIN A1C: Hgb A1c MFr Bld: 9.1 % — ABNORMAL HIGH (ref 4.6–6.5)

## 2016-03-06 LAB — VITAMIN D 25 HYDROXY (VIT D DEFICIENCY, FRACTURES): VITD: 21.88 ng/mL — AB (ref 30.00–100.00)

## 2016-03-06 NOTE — Assessment & Plan Note (Signed)
Reviewed and updated patient's medical, surgical, family and social history. Medications and allergies were also reviewed. Basic screenings for depression, activities of daily living, hearing, cognition and safety were performed. Provider list was updated and health plan was provided to the patient.  

## 2016-03-06 NOTE — Progress Notes (Signed)
Subjective:    Patient ID: Tiffany [name], female    DOB: 07/17/1950, 65 y.o.   MRN: [id number]  Chief Complaint  Patient presents with  . Medicare Wellness  . Annual Exam    HPI:  Tiffany [name] is a 65 y.o. female who presents today for a Medicare Annual Wellness/Physical exam.    1) Health Maintenance -   Diet - Averaging about 2 meals per day consiting of a regular diet. Caffeine intake of 1-2 cups per day.  Exercise - Walking and occasional stretching from physical therapy.   2) Preventative Exams / Immunizations:  Dental --Due for exam.   Vision -- Due for exam, request to schedule.     Health Maintenance  Topic Date Due  . HIV Screening  07/10/1966  . FOOT EXAM  01/26/2014  . OPHTHALMOLOGY EXAM  08/24/2014  . MAMMOGRAM  09/18/2015  . INFLUENZA VACCINE  12/26/2015  . HEMOGLOBIN A1C  01/02/2016  . LIPID PANEL  02/07/2016  . TETANUS/TDAP  09/27/2018  . COLONOSCOPY  07/22/2022  . ZOSTAVAX  Completed  . Hepatitis C Screening  Completed     Immunization History  Administered Date(s) Administered  . Influenza Split 06/08/2012  . Influenza Whole 05/25/2007, 03/17/2008, 03/27/2009  . Influenza,inj,Quad PF,36+ Mos 02/07/2015, 03/06/2016  . Pneumococcal Polysaccharide-23 06/27/2004, 03/06/2016  . Td 09/26/2008  . Zoster 02/14/2014    RISK FACTORS  Tobacco History  Smoking Status  . Former Smoker  . Packs/day: 0.50  . Years: 15.00  . Quit date: 08/02/1990  Smokeless Tobacco  . Never Used     Cardiac risk factors: diabetes mellitus, dyslipidemia and hypertension.  Depression Screen  Depression screen PHQ 2/9 03/06/2016  Decreased Interest 0  Down, Depressed, Hopeless 0  PHQ - 2 Score 0     Activities of Daily Living In your present state of health, do you have any difficulty performing the following activities?:  Driving? No Managing money?  No Feeding yourself? No Getting from bed to chair? No Climbing a flight of stairs? No Preparing  food and eating?: No Bathing or showering? No Getting dressed: No Getting to the toilet? No Using the toilet: No Moving around from place to place: No In the past year have you fallen or had a near fall?:No   Home Safety Has smoke detector and wears seat belts. No firearms. No excess sun exposure. Are there smokers in your home (other than you)?  No Do you feel safe at home?  Yes  Hearing Difficulties: No Do you often ask people to speak up or repeat themselves? No Do you experience ringing or noises in your ears? No  Do you have difficulty understanding soft or whispered voices? No    Cognitive Testing  Alert? Yes   Normal Appearance? Yes  Oriented to person? Yes  Place? Yes   Time? Yes  Recall of three objects?  Yes  Can perform simple calculations? Yes  Displays appropriate judgment? Yes  Can read the correct time from a watch face? Yes  Do you feel that you have a problem with memory? No  Do you often misplace items? No   Advanced Directives have been discussed with the patient? Yes   Current Physicians/Providers and Suppliers  1. Terri Piedra, FNP - Internal Medicine 2. Consuella Lose, MD - Neurosurgery  Indicate any recent Medical Services you may have received from other than [facility] providers in the past year (date may be approximate).  All answers were reviewed with the  patient and necessary referrals were made:  Mauricio Po, FNP   03/06/2016    Allergies  Allergen Reactions  . Metformin And Related     Severe nausea and vomiting  . Aspirin Nausea And Vomiting    Can take EC asa  . Contrast Media [Iodinated Diagnostic Agents] Nausea And Vomiting  . Iodine Nausea And Vomiting  . Shellfish Allergy Nausea And Vomiting  . Penicillins   . Tramadol Nausea And Vomiting    Migraine     Outpatient Medications Prior to Visit  Medication Sig Dispense Refill  . amLODipine (NORVASC) 5 MG tablet TAKE 1 TABLET (5 MG TOTAL) BY MOUTH DAILY. 90 tablet 3  .  Blood Glucose Monitoring Suppl (ACCU-CHEK NANO SMARTVIEW) W/DEVICE KIT Use to check blood sugar as directed up to 3 times a day. diag code 250.02. Insulin dependent 1 kit 0  . gabapentin (NEURONTIN) 300 MG capsule Take 1 capsule (300 mg total) by mouth 3 (three) times daily. 90 capsule 3  . glimepiride (AMARYL) 4 MG tablet TAKE 1 TABLET (4 MG TOTAL) BY MOUTH DAILY WITH BREAKFAST. 90 tablet 1  . glucose blood (AGAMATRIX PRESTO TEST) test strip Use as instructed 100 each 12  . Insulin Glargine (LANTUS SOLOSTAR) 100 UNIT/ML Solostar Pen Inject 36 Units into the skin at bedtime.     . Insulin Pen Needle 32G X 4 MM MISC Use to inject  lantus once daily 100 each 4  . quinapril (ACCUPRIL) 40 MG tablet TAKE 1 TABLET (40 MG TOTAL) BY MOUTH DAILY. 90 tablet 3  . ranitidine (ZANTAC) 150 MG tablet Take 150 mg by mouth 2 (two) times daily as needed for heartburn.    . zolpidem (AMBIEN) 10 MG tablet TAKE ONE TABLET BY MOUTH AT BEDTIME AS NEEDED 30 tablet 0  . glucosamine-chondroitin (MAX GLUCOSAMINE CHONDROITIN) 500-400 MG tablet Take 1 tablet by mouth 3 (three) times daily. 90 tablet 3  . methocarbamol (ROBAXIN) 750 MG tablet Take 1 tablet (750 mg total) by mouth every 6 (six) hours as needed for muscle spasms. 90 tablet 0  . oxyCODONE (ROXICODONE) 15 MG immediate release tablet Take 1 tablet (15 mg total) by mouth every 4 (four) hours as needed for breakthrough pain. 60 tablet 0   No facility-administered medications prior to visit.      Past Medical History:  Diagnosis Date  . Degenerative joint disease   . Diabetes mellitus   . Headache   . Hyperlipidemia   . Hypertension   . Sleep apnea    stopped using it     Past Surgical History:  Procedure Laterality Date  . ABDOMINAL HYSTERECTOMY  1976  . BACK SURGERY    . KNEE ARTHROSCOPY     left x 2  . LUMBAR LAMINECTOMY  1989     Family History  Problem Relation Age of Onset  . Hypertension Mother   . Diabetes Mother   . Thyroid disease  Mother   . Heart disease Mother   . Kidney disease Mother   . Diabetes Sister   . Hypertension Brother   . Stroke Brother   . Heart disease Brother   . Colon cancer Neg Hx   . Colon polyps Neg Hx   . Rectal cancer Neg Hx   . Stomach cancer Neg Hx      Social History   Social History  . Marital status: Single    Spouse name: N/A  . Number of children: N/A  . Years of education: 8  Occupational History  . Disability    Social History Main Topics  . Smoking status: Former Smoker    Packs/day: 0.50    Years: 15.00    Quit date: 08/02/1990  . Smokeless tobacco: Never Used  . Alcohol use No  . Drug use: No  . Sexual activity: No   Other Topics Concern  . Not on file   Social History Narrative   Born in [place], [place].   Completed the 8th grade   Fun: Walk, crochet, puzzles   Diet - Variety of foods, does not eat breakfast   2 children - 44 and 45    6 grandchildren - 7 great grandchildren.      Review of Systems  Constitutional: Denies fever, chills, fatigue, or significant weight gain/loss. HENT: Head: Denies headache or neck pain Ears: Denies changes in hearing, ringing in ears, earache, drainage Nose: Denies discharge, stuffiness, itching, nosebleed, sinus pain Throat: Denies sore throat, hoarseness, dry mouth, sores, thrush Eyes: Denies loss/changes in vision, pain, redness, blurry/double vision, flashing lights Cardiovascular: Denies chest pain/discomfort, tightness, palpitations, shortness of breath with activity, difficulty lying down, swelling, sudden awakening with shortness of breath Respiratory: Denies shortness of breath, cough, sputum production, wheezing Gastrointestinal: Denies dysphasia, heartburn, change in appetite, nausea, change in bowel habits, rectal bleeding, constipation, diarrhea, yellow skin or eyes Genitourinary: Denies frequency, urgency, burning/pain, blood in urine, incontinence, change in urinary strength. Musculoskeletal: Denies  muscle/joint pain, stiffness, back pain, redness or swelling of joints, trauma Skin: Denies rashes, lumps, itching, dryness, color changes, or hair/nail changes Neurological: Denies dizziness, fainting, seizures, weakness, numbness, tingling, tremor Psychiatric - Denies nervousness, stress, depression or memory loss Endocrine: Denies heat or cold intolerance, sweating, frequent urination, excessive thirst, changes in appetite Hematologic: Denies ease of bruising or bleeding    Objective:     BP (!) 138/92 (BP Location: Left Arm, Patient Position: Sitting, Cuff Size: Normal)   Pulse 100   Temp 98.4 F (36.9 C) (Oral)   Resp 16   Ht 5' 3"  (1.6 m)   Wt 141 lb (64 kg)   SpO2 96%   BMI 24.98 kg/m  Nursing note and vital signs reviewed.  Physical Exam  Constitutional: She is oriented to person, place, and time. She appears well-developed and well-nourished.  Surgical shoe noted on right foot and ambulates with cane.   HENT:  Head: Normocephalic.  Right Ear: Hearing, tympanic membrane, external ear and ear canal normal.  Left Ear: Hearing, tympanic membrane, external ear and ear canal normal.  Nose: Nose normal.  Mouth/Throat: Uvula is midline, oropharynx is clear and moist and mucous membranes are normal. Abnormal dentition.  Eyes: Conjunctivae and EOM are normal. Pupils are equal, round, and reactive to light.  Neck: Neck supple. No JVD present. No tracheal deviation present. No thyromegaly present.  Cardiovascular: Normal rate, regular rhythm, normal heart sounds and intact distal pulses.   Pulmonary/Chest: Effort normal and breath sounds normal.  Abdominal: Soft. Bowel sounds are normal. She exhibits no distension and no mass. There is no tenderness. There is no rebound and no guarding.  Musculoskeletal: Normal range of motion. She exhibits no edema or tenderness.  Lymphadenopathy:    She has no cervical adenopathy.  Neurological: She is alert and oriented to person, place, and  time. She has normal reflexes. No cranial nerve deficit. She exhibits normal muscle tone. Coordination normal.  Skin: Skin is warm and dry.  Psychiatric: She has a normal mood and affect. Her behavior is normal. Judgment  and thought content normal.       Assessment & Plan:   During the course of the visit the patient was educated and counseled about appropriate screening and preventive services including:    Pneumococcal vaccine   Influenza vaccine  Td vaccine  Colorectal cancer screening  Diabetes screening  Nutrition counseling   Diet review for nutrition referral? Yes ____  Not Indicated _X___   Patient Instructions (the written plan) was given to the patient.  Medicare Attestation I have personally reviewed: The patient's medical and social history Their use of alcohol, tobacco or illicit drugs Their current medications and supplements The patient's functional ability including ADLs,fall risks, home safety risks, cognitive, and hearing and visual impairment Diet and physical activities Evidence for depression or mood disorders  The patient's weight, height, BMI,  have been recorded in the chart.  I have made referrals, counseling, and provided education to the patient based on review of the above and I have provided the patient with a written personalized care plan for preventive services.     Problem List Items Addressed This Visit      Cardiovascular and Mediastinum   Essential hypertension    Hypertension slightly elevated today with current regimen. Encouraged to monitor blood pressure at home and follow low-sodium diet. Continue current dosage of amlodipine and quinapril. If blood pressure remains elevated consider increasing amlodipine or an additional agent.        Endocrine   Diabetes mellitus type 2, uncontrolled, without complications (no retinopathy)    Type 2 diabetes appears poorly controlled with current regimen. Obtain A1c and urine microalbumin.  Diabetic foot exam deferred secondary to fractured foot. Referral placed for diabetic eye exam. Currently maintained on quinapril for CAD risk reduction. Pneumovax updated today. Encouraged to continue monitoring blood sugars at home. Follow-up and changes per A1c to results. Consider referral to endocrinology.      Relevant Orders   Hemoglobin A1c   Urine Microalbumin w/creat. ratio     Other   Hyperlipidemia (Chronic)    Previously diagnosed with hyperlipidemia and not currently maintained on cholesterol medication. Obtain lipid profile. Continue lifestyle management pending lipid profile results.      Preventative health care (Chronic)    1) Anticipatory Guidance: Discussed importance of wearing a seatbelt while driving and not texting while driving; changing batteries in smoke detector at least once annually; wearing suntan lotion when outside; eating a balanced and moderate diet; getting physical activity at least 30 minutes per day.  2) Immunizations / Screenings / Labs:  Pneumovax updated today. All other immunizations are up-to-date per recommendations. Diabetic foot exam deferred secondary to fractured foot. Due for breast cancer screening with patient declining mammogram at this time. Due for diabetic eye exam with referral to ophthalmology placed. All other screenings are up-to-date per recommendations. Obtain A1c and urine microalbumin for diabetes. Obtain CBC, CMET, and lipid profile.    Overall fair exam with risk factors for cardiovascular disease including uncontrolled diabetes, hyperlipidemia, and hypertension. Chronic conditions remain labile with most recent A1c of greater than 10. Blood pressure slightly elevated today. Recommend nutritional changes including decreasing sodium in diet and increasing physical activity as able. Continue other healthy lifestyle behaviors and choices. Follow-up prevention exam in 1 year. Follow-up office visit chronic conditions in 3 months or  sooner if needed.       Relevant Orders   CBC   Comprehensive metabolic panel   Lipid panel   VITAMIN D 25 Hydroxy (Vit-D Deficiency,  Fractures)   Hemoglobin A1c   Urine Microalbumin w/creat. ratio   Medicare annual wellness visit, subsequent - Primary    Reviewed and updated patient's medical, surgical, family and social history. Medications and allergies were also reviewed. Basic screenings for depression, activities of daily living, hearing, cognition and safety were performed. Provider list was updated and health plan was provided to the patient.        Other Visit Diagnoses    Need for 23-polyvalent pneumococcal polysaccharide vaccine       Relevant Orders   Pneumococcal polysaccharide vaccine 23-valent greater than or equal to 2yo subcutaneous/IM (Completed)   Encounter for immunization       Relevant Orders   Flu Vaccine QUAD 36+ mos IM (Completed)       I have discontinued Ms. Nygard's glucosamine-chondroitin, methocarbamol, and oxyCODONE. I am also having her maintain her ACCU-CHEK NANO SMARTVIEW, Insulin Glargine, Insulin Pen Needle, glucose blood, ranitidine, gabapentin, amLODipine, quinapril, glimepiride, and zolpidem.   Follow-up: Return in about 3 months (around 06/06/2016), or if symptoms worsen or fail to improve.   Mauricio Po, FNP

## 2016-03-06 NOTE — Assessment & Plan Note (Signed)
Previously diagnosed with hyperlipidemia and not currently maintained on cholesterol medication. Obtain lipid profile. Continue lifestyle management pending lipid profile results.

## 2016-03-06 NOTE — Addendum Note (Signed)
Addended by: Mauricio Po D on: 03/06/2016 02:36 PM   Modules accepted: Orders

## 2016-03-06 NOTE — Assessment & Plan Note (Signed)
1) Anticipatory Guidance: Discussed importance of wearing a seatbelt while driving and not texting while driving; changing batteries in smoke detector at least once annually; wearing suntan lotion when outside; eating a balanced and moderate diet; getting physical activity at least 30 minutes per day.  2) Immunizations / Screenings / Labs:  Pneumovax updated today. All other immunizations are up-to-date per recommendations. Diabetic foot exam deferred secondary to fractured foot. Due for breast cancer screening with patient declining mammogram at this time. Due for diabetic eye exam with referral to ophthalmology placed. All other screenings are up-to-date per recommendations. Obtain A1c and urine microalbumin for diabetes. Obtain CBC, CMET, and lipid profile.    Overall fair exam with risk factors for cardiovascular disease including uncontrolled diabetes, hyperlipidemia, and hypertension. Chronic conditions remain labile with most recent A1c of greater than 10. Blood pressure slightly elevated today. Recommend nutritional changes including decreasing sodium in diet and increasing physical activity as able. Continue other healthy lifestyle behaviors and choices. Follow-up prevention exam in 1 year. Follow-up office visit chronic conditions in 3 months or sooner if needed.

## 2016-03-06 NOTE — Assessment & Plan Note (Signed)
Hypertension slightly elevated today with current regimen. Encouraged to monitor blood pressure at home and follow low-sodium diet. Continue current dosage of amlodipine and quinapril. If blood pressure remains elevated consider increasing amlodipine or an additional agent.

## 2016-03-06 NOTE — Patient Instructions (Addendum)
Thank you for choosing [facility].  SUMMARY AND INSTRUCTIONS:  Medication:  Please continue to take your medications as prescribed.   Your prescription(s) have been submitted to your pharmacy or been printed and provided for you. Please take as directed and contact our office if you believe you are having problem(s) with the medication(s) or have any questions.  Labs:  Please stop by the lab on the lower level of the building for your blood work. Your results will be released to MyChart (or called to you) after review, usually within 72 hours after test completion. If any changes need to be made, you will be notified at that same time.  1.) The lab is open from 7:30am to 5:30 pm Monday-Friday 2.) No appointment is necessary 3.) Fasting (if needed) is 6-8 hours after food and drink; black coffee and water are okay    Health Maintenance  Topic Date Due  . HIV Screening  07/10/1965  . FOOT EXAM  01/26/2014  . OPHTHALMOLOGY EXAM  08/24/2014  . MAMMOGRAM  09/18/2015  . INFLUENZA VACCINE  12/26/2015  . HEMOGLOBIN A1C  01/02/2016  . LIPID PANEL  02/07/2016  . TETANUS/TDAP  09/27/2018  . COLONOSCOPY  07/22/2022  . ZOSTAVAX  Completed  . Hepatitis C Screening  Completed    Health Maintenance, Female Adopting a healthy lifestyle and getting preventive care can go a long way to promote health and wellness. Talk with your health care provider about what schedule of regular examinations is right for you. This is a good chance for you to check in with your provider about disease prevention and staying healthy. In between checkups, there are plenty of things you can do on your own. Experts have done a lot of research about which lifestyle changes and preventive measures are most likely to keep you healthy. Ask your health care provider for more information. WEIGHT AND DIET  Eat a healthy diet  Be sure to include plenty of vegetables, fruits, low-fat dairy products, and lean  protein.  Do not eat a lot of foods high in solid fats, added sugars, or salt.  Get regular exercise. This is one of the most important things you can do for your health.  Most adults should exercise for at least 150 minutes each week. The exercise should increase your heart rate and make you sweat (moderate-intensity exercise).  Most adults should also do strengthening exercises at least twice a week. This is in addition to the moderate-intensity exercise.  Maintain a healthy weight  Body mass index (BMI) is a measurement that can be used to identify possible weight problems. It estimates body fat based on height and weight. Your health care provider can help determine your BMI and help you achieve or maintain a healthy weight.  For females 64 years of age and older:   A BMI below 18.5 is considered underweight.  A BMI of 18.5 to 24.9 is normal.  A BMI of 25 to 29.9 is considered overweight.  A BMI of 30 and above is considered obese.  Watch levels of cholesterol and blood lipids  You should start having your blood tested for lipids and cholesterol at 64 years of age, then have this test every 5 years.  You may need to have your cholesterol levels checked more often if:  Your lipid or cholesterol levels are high.  You are older than 64 years of age.  You are at high risk for heart disease.  CANCER SCREENING   Lung Cancer  Lung cancer screening is recommended for adults 64-47 years old who are at high risk for lung cancer because of a history of smoking.  A yearly low-dose CT scan of the lungs is recommended for people who:  Currently smoke.  Have quit within the past 15 years.  Have at least a 30-pack-year history of smoking. A pack year is smoking an average of one pack of cigarettes a day for 1 year.  Yearly screening should continue until it has been 15 years since you quit.  Yearly screening should stop if you develop a health problem that would prevent you  from having lung cancer treatment.  Breast Cancer  Practice breast self-awareness. This means understanding how your breasts normally appear and feel.  It also means doing regular breast self-exams. Let your health care provider know about any changes, no matter how small.  If you are in your 64s or 30s, you should have a clinical breast exam (CBE) by a health care provider every 1-3 years as part of a regular health exam.  If you are 64 or older, have a CBE every year. Also consider having a breast X-ray (mammogram) every year.  If you have a family history of breast cancer, talk to your health care provider about genetic screening.  If you are at high risk for breast cancer, talk to your health care provider about having an MRI and a mammogram every year.  Breast cancer gene (BRCA) assessment is recommended for women who have family members with BRCA-related cancers. BRCA-related cancers include:  Breast.  Ovarian.  Tubal.  Peritoneal cancers.  Results of the assessment will determine the need for genetic counseling and BRCA1 and BRCA2 testing. Cervical Cancer Your health care provider may recommend that you be screened regularly for cancer of the pelvic organs (ovaries, uterus, and vagina). This screening involves a pelvic examination, including checking for microscopic changes to the surface of your cervix (Pap test). You may be encouraged to have this screening done every 3 years, beginning at age 64.  For women ages 56-65, health care providers may recommend pelvic exams and Pap testing every 3 years, or they may recommend the Pap and pelvic exam, combined with testing for human papilloma virus (HPV), every 5 years. Some types of HPV increase your risk of cervical cancer. Testing for HPV may also be done on women of any age with unclear Pap test results.  Other health care providers may not recommend any screening for nonpregnant women who are considered low risk for pelvic  cancer and who do not have symptoms. Ask your health care provider if a screening pelvic exam is right for you.  If you have had past treatment for cervical cancer or a condition that could lead to cancer, you need Pap tests and screening for cancer for at least 20 years after your treatment. If Pap tests have been discontinued, your risk factors (such as having a new sexual partner) need to be reassessed to determine if screening should resume. Some women have medical problems that increase the chance of getting cervical cancer. In these cases, your health care provider may recommend more frequent screening and Pap tests. Colorectal Cancer  This type of cancer can be detected and often prevented.  Routine colorectal cancer screening usually begins at 64 years of age and continues through 64 years of age.  Your health care provider may recommend screening at an earlier age if you have risk factors for colon cancer.  Your health care  provider may also recommend using home test kits to check for hidden blood in the stool.  A small camera at the end of a tube can be used to examine your colon directly (sigmoidoscopy or colonoscopy). This is done to check for the earliest forms of colorectal cancer.  Routine screening usually begins at age 35.  Direct examination of the colon should be repeated every 5-10 years through 64 years of age. However, you may need to be screened more often if early forms of precancerous polyps or small growths are found. Skin Cancer  Check your skin from head to toe regularly.  Tell your health care provider about any new moles or changes in moles, especially if there is a change in a mole's shape or color.  Also tell your health care provider if you have a mole that is larger than the size of a pencil eraser.  Always use sunscreen. Apply sunscreen liberally and repeatedly throughout the day.  Protect yourself by wearing long sleeves, pants, a wide-brimmed hat, and  sunglasses whenever you are outside. HEART DISEASE, DIABETES, AND HIGH BLOOD PRESSURE   High blood pressure causes heart disease and increases the risk of stroke. High blood pressure is more likely to develop in:  People who have blood pressure in the high end of the normal range (130-139/85-89 mm Hg).  People who are overweight or obese.  People who are African American.  If you are 25-78 years of age, have your blood pressure checked every 3-5 years. If you are 24 years of age or older, have your blood pressure checked every year. You should have your blood pressure measured twice--once when you are at a hospital or clinic, and once when you are not at a hospital or clinic. Record the average of the two measurements. To check your blood pressure when you are not at a hospital or clinic, you can use:  An automated blood pressure machine at a pharmacy.  A home blood pressure monitor.  If you are between 60 years and 40 years old, ask your health care provider if you should take aspirin to prevent strokes.  Have regular diabetes screenings. This involves taking a blood sample to check your fasting blood sugar level.  If you are at a normal weight and have a low risk for diabetes, have this test once every three years after 64 years of age.  If you are overweight and have a high risk for diabetes, consider being tested at a younger age or more often. PREVENTING INFECTION  Hepatitis B  If you have a higher risk for hepatitis B, you should be screened for this virus. You are considered at high risk for hepatitis B if:  You were born in a country where hepatitis B is common. Ask your health care provider which countries are considered high risk.  Your parents were born in a high-risk country, and you have not been immunized against hepatitis B (hepatitis B vaccine).  You have HIV or AIDS.  You use needles to inject street drugs.  You live with someone who has hepatitis B.  You have  had sex with someone who has hepatitis B.  You get hemodialysis treatment.  You take certain medicines for conditions, including cancer, organ transplantation, and autoimmune conditions. Hepatitis C  Blood testing is recommended for:  Everyone born from [birth year] through 1964.  Anyone with known risk factors for hepatitis C. Sexually transmitted infections (STIs)  You should be screened for sexually transmitted infections (STIs)  including gonorrhea and chlamydia if:  You are sexually active and are younger than 64 years of age.  You are older than 64 years of age and your health care provider tells you that you are at risk for this type of infection.  Your sexual activity has changed since you were last screened and you are at an increased risk for chlamydia or gonorrhea. Ask your health care provider if you are at risk.  If you do not have HIV, but are at risk, it may be recommended that you take a prescription medicine daily to prevent HIV infection. This is called pre-exposure prophylaxis (PrEP). You are considered at risk if:  You are sexually active and do not regularly use condoms or know the HIV status of your partner(s).  You take drugs by injection.  You are sexually active with a partner who has HIV. Talk with your health care provider about whether you are at high risk of being infected with HIV. If you choose to begin PrEP, you should first be tested for HIV. You should then be tested every 3 months for as long as you are taking PrEP.  PREGNANCY   If you are premenopausal and you may become pregnant, ask your health care provider about preconception counseling.  If you may become pregnant, take 400 to 800 micrograms (mcg) of folic acid every day.  If you want to prevent pregnancy, talk to your health care provider about birth control (contraception). OSTEOPOROSIS AND MENOPAUSE   Osteoporosis is a disease in which the bones lose minerals and strength with aging. This can  result in serious bone fractures. Your risk for osteoporosis can be identified using a bone density scan.  If you are 72 years of age or older, or if you are at risk for osteoporosis and fractures, ask your health care provider if you should be screened.  Ask your health care provider whether you should take a calcium or vitamin D supplement to lower your risk for osteoporosis.  Menopause may have certain physical symptoms and risks.  Hormone replacement therapy may reduce some of these symptoms and risks. Talk to your health care provider about whether hormone replacement therapy is right for you.  HOME CARE INSTRUCTIONS   Schedule regular health, dental, and eye exams.  Stay current with your immunizations.   Do not use any tobacco products including cigarettes, chewing tobacco, or electronic cigarettes.  If you are pregnant, do not drink alcohol.  If you are breastfeeding, limit how much and how often you drink alcohol.  Limit alcohol intake to no more than 1 drink per day for nonpregnant women. One drink equals 12 ounces of beer, 5 ounces of wine, or 1 ounces of hard liquor.  Do not use street drugs.  Do not share needles.  Ask your health care provider for help if you need support or information about quitting drugs.  Tell your health care provider if you often feel depressed.  Tell your health care provider if you have ever been abused or do not feel safe at home.   This information is not intended to replace advice given to you by your health care provider. Make sure you discuss any questions you have with your health care provider.   Document Released: 11/26/2010 Document Revised: 06/03/2014 Document Reviewed: 04/14/2013 Elsevier Interactive Patient Education 2016 [employer].   Advance Directive Advance directives are the legal documents that allow you to make choices about your health care and medical treatment if you  cannot speak for yourself. Advance  directives are a way for you to communicate your wishes to family, friends, and health care providers. The specified people can then convey your decisions about end-of-life care to avoid confusion if you should become unable to communicate. Ideally, the process of discussing and writing advance directives should happen over time rather than making decisions all at once. Advance directives can be modified as your situation changes, and you can change your mind at any time, even after you have signed the advance directives. Each state has its own laws regarding advance directives. You may want to check with your health care provider, attorney, or state representative about the law in your state. Below are some examples of advance directives. LIVING WILL A living will is a set of instructions documenting your wishes about medical care when you cannot care for yourself. It is used if you become:  Terminally ill.  Incapacitated.  Unable to communicate.  Unable to make decisions. Items to consider in your living will include:  The use or non-use of life-sustaining equipment, such as dialysis machines and breathing machines (ventilators).  A do not resuscitate (DNR) order, which is the instruction not to use cardiopulmonary resuscitation (CPR) if breathing or heartbeat stops.  Tube feeding.  Withholding of food and fluids.  Comfort (palliative) care when the goal becomes comfort rather than a cure.  Organ and tissue donation. A living will does not give instructions about distribution of your money and property if you should pass away. It is advisable to seek the expert advice of a lawyer in drawing up a will regarding your possessions. Decisions about taxes, beneficiaries, and asset distribution will be legally binding. This process can relieve your family and friends of any burdens surrounding disputes or questions that may come up about the allocation of your assets. DO NOT RESUSCITATE  (DNR) A do not resuscitate (DNR) order is a request to not have CPR in the event that your heart stops beating or you stop breathing. Unless given other instructions, a health care provider will try to help any patient whose heart has stopped or who has stopped breathing.  HEALTH CARE PROXY AND DURABLE POWER OF ATTORNEY FOR HEALTH CARE A health care proxy is a person (agent) appointed to make medical decisions for you if you cannot. Generally, people choose someone they know well and trust to represent their preferences when they can no longer do so. You should be sure to ask this person for agreement to act as your agent. An agent may have to exercise judgment in the event of a medical decision for which your wishes are not known. The durable power of attorney for health care is the legal document that names your health care proxy. Once written, it should be:  Signed.  Notarized.  Dated.  Copied.  Witnessed.  Incorporated into your medical record. You may also want to appoint someone to manage your financial affairs if you cannot. This is called a durable power of attorney for finances. It is a separate legal document from the durable power of attorney for health care. You may choose the same person or someone different from your health care proxy to act as your agent in financial matters.   This information is not intended to replace advice given to you by your health care provider. Make sure you discuss any questions you have with your health care provider.   Document Released: 08/20/2007 Document Revised: 05/18/2013 Document Reviewed: 09/30/2012 Elsevier Interactive Patient  Education 2016 [employer].

## 2016-03-06 NOTE — Assessment & Plan Note (Signed)
Type 2 diabetes appears poorly controlled with current regimen. Obtain A1c and urine microalbumin. Diabetic foot exam deferred secondary to fractured foot. Referral placed for diabetic eye exam. Currently maintained on quinapril for CAD risk reduction. Pneumovax updated today. Encouraged to continue monitoring blood sugars at home. Follow-up and changes per A1c to results. Consider referral to endocrinology.

## 2016-03-07 ENCOUNTER — Encounter: Payer: Self-pay | Admitting: Family

## 2016-03-07 ENCOUNTER — Ambulatory Visit
Admission: RE | Admit: 2016-03-07 | Discharge: 2016-03-07 | Disposition: A | Discharge status: Home / Self Care | Payer: Medicare Other | Source: Ambulatory Visit | Attending: Neurosurgery | Admitting: Neurosurgery

## 2016-03-07 DIAGNOSIS — M5126 Other intervertebral disc displacement, lumbar region: Secondary | ICD-10-CM | POA: Diagnosis not present

## 2016-03-07 DIAGNOSIS — M545 Low back pain: Principal | ICD-10-CM

## 2016-03-07 DIAGNOSIS — G8929 Other chronic pain: Secondary | ICD-10-CM

## 2016-03-08 ENCOUNTER — Ambulatory Visit: Payer: Medicare Other | Admitting: Family

## 2016-03-14 NOTE — Telephone Encounter (Signed)
Patient needs refill on latus, test strips and needles.  Patient uses Paediatric nurse at [facility].  Patient called in.  I gave her Greg's response on labs.  Patient is requesting call back in regard to what cholesterol medication she would be put on.   I have scheduled patients 3 month fu.

## 2016-03-19 ENCOUNTER — Other Ambulatory Visit: Payer: Self-pay

## 2016-03-19 ENCOUNTER — Other Ambulatory Visit: Payer: Self-pay | Admitting: Family

## 2016-03-19 DIAGNOSIS — E1165 Type 2 diabetes mellitus with hyperglycemia: Principal | ICD-10-CM

## 2016-03-19 DIAGNOSIS — Z794 Long term (current) use of insulin: Principal | ICD-10-CM

## 2016-03-19 DIAGNOSIS — IMO0001 Reserved for inherently not codable concepts without codable children: Secondary | ICD-10-CM

## 2016-03-19 MED ORDER — INSULIN PEN NEEDLE 32G X 4 MM MISC
4 refills | Status: AC
Start: 2016-03-19 — End: ?

## 2016-03-19 MED ORDER — GLUCOSE BLOOD VI STRP: ORAL_STRIP | 12 refills | Status: DC

## 2016-03-19 MED ORDER — PRAVASTATIN SODIUM 20 MG PO TABS: 20.0000 mg | ORAL_TABLET | Freq: Every day | ORAL | 2 refills | Status: DC

## 2016-03-19 MED ORDER — INSULIN PEN NEEDLE 32G X 4 MM MISC: 4 refills | Status: DC

## 2016-03-19 MED ORDER — INSULIN GLARGINE 100 UNIT/ML SOLOSTAR PEN: 36.0000 [IU] | PEN_INJECTOR | Freq: Every day | SUBCUTANEOUS | 3 refills | Status: DC

## 2016-03-20 ENCOUNTER — Other Ambulatory Visit: Payer: Self-pay | Admitting: Family

## 2016-04-03 DIAGNOSIS — M545 Low back pain: Secondary | ICD-10-CM | POA: Diagnosis not present

## 2016-04-03 DIAGNOSIS — S32009K Unspecified fracture of unspecified lumbar vertebra, subsequent encounter for fracture with nonunion: Secondary | ICD-10-CM | POA: Diagnosis not present

## 2016-04-03 DIAGNOSIS — Z6825 Body mass index (BMI) 25.0-25.9, adult: Secondary | ICD-10-CM | POA: Diagnosis not present

## 2016-04-03 DIAGNOSIS — I10 Essential (primary) hypertension: Secondary | ICD-10-CM | POA: Diagnosis not present

## 2016-04-10 DIAGNOSIS — S92511D Displaced fracture of proximal phalanx of right lesser toe(s), subsequent encounter for fracture with routine healing: Secondary | ICD-10-CM | POA: Diagnosis not present

## 2016-04-22 DIAGNOSIS — E119 Type 2 diabetes mellitus without complications: Secondary | ICD-10-CM | POA: Diagnosis not present

## 2016-04-22 DIAGNOSIS — H25813 Combined forms of age-related cataract, bilateral: Secondary | ICD-10-CM | POA: Diagnosis not present

## 2016-04-26 ENCOUNTER — Other Ambulatory Visit: Payer: Self-pay | Admitting: Family

## 2016-04-29 NOTE — Telephone Encounter (Signed)
Last refill:02/21/16

## 2016-04-29 NOTE — Telephone Encounter (Signed)
Faxed

## 2016-04-30 ENCOUNTER — Other Ambulatory Visit: Payer: Self-pay

## 2016-04-30 DIAGNOSIS — Z794 Long term (current) use of insulin: Principal | ICD-10-CM

## 2016-04-30 DIAGNOSIS — IMO0001 Reserved for inherently not codable concepts without codable children: Secondary | ICD-10-CM

## 2016-04-30 DIAGNOSIS — E1165 Type 2 diabetes mellitus with hyperglycemia: Principal | ICD-10-CM

## 2016-04-30 MED ORDER — GLUCOSE BLOOD VI STRP: ORAL_STRIP | 12 refills | Status: DC

## 2016-05-16 ENCOUNTER — Telehealth: Payer: Self-pay | Admitting: *Deleted

## 2016-05-16 MED ORDER — GLUCOSE BLOOD VI STRP: 1.0000 | ORAL_STRIP | Freq: Three times a day (TID) | 5 refills | Status: DC

## 2016-05-16 NOTE — Telephone Encounter (Signed)
Left  msg on triage stating needing refill on her testing strips. Called pt back no answer LMOM needing to know what type of BS monitor she is using....Johny Chess

## 2016-05-16 NOTE — Telephone Encounter (Signed)
Sent strips to [facility]...Tiffany [name]

## 2016-05-16 NOTE — Telephone Encounter (Signed)
accu chek aviva is the meter and the accu check aviva plus is the strips

## 2016-05-17 DIAGNOSIS — E118 Type 2 diabetes mellitus with unspecified complications: Secondary | ICD-10-CM | POA: Diagnosis not present

## 2016-06-08 ENCOUNTER — Other Ambulatory Visit: Payer: Self-pay | Admitting: Family

## 2016-06-10 ENCOUNTER — Ambulatory Visit: Payer: Medicare Other | Admitting: Family

## 2016-06-18 ENCOUNTER — Other Ambulatory Visit: Payer: Self-pay | Admitting: Family

## 2016-06-26 ENCOUNTER — Other Ambulatory Visit: Payer: Self-pay

## 2016-06-26 DIAGNOSIS — E119 Type 2 diabetes mellitus without complications: Secondary | ICD-10-CM | POA: Diagnosis not present

## 2016-06-26 MED ORDER — ACCU-CHEK SOFTCLIX LANCETS MISC: 12 refills | Status: DC

## 2016-07-18 ENCOUNTER — Other Ambulatory Visit: Payer: Self-pay | Admitting: Family

## 2016-07-18 NOTE — Telephone Encounter (Signed)
Last refill was 04/29/16

## 2016-07-19 NOTE — Telephone Encounter (Signed)
Faxed

## 2016-07-24 DIAGNOSIS — E118 Type 2 diabetes mellitus with unspecified complications: Secondary | ICD-10-CM | POA: Diagnosis not present

## 2016-08-18 DIAGNOSIS — E119 Type 2 diabetes mellitus without complications: Secondary | ICD-10-CM | POA: Diagnosis not present

## 2016-09-04 DIAGNOSIS — E118 Type 2 diabetes mellitus with unspecified complications: Secondary | ICD-10-CM | POA: Diagnosis not present

## 2016-10-01 ENCOUNTER — Other Ambulatory Visit: Payer: Self-pay | Admitting: Family

## 2016-10-02 DIAGNOSIS — I10 Essential (primary) hypertension: Secondary | ICD-10-CM | POA: Diagnosis not present

## 2016-10-02 DIAGNOSIS — S32009K Unspecified fracture of unspecified lumbar vertebra, subsequent encounter for fracture with nonunion: Secondary | ICD-10-CM | POA: Diagnosis not present

## 2016-10-02 DIAGNOSIS — M4316 Spondylolisthesis, lumbar region: Secondary | ICD-10-CM | POA: Diagnosis not present

## 2016-10-02 DIAGNOSIS — Z6826 Body mass index (BMI) 26.0-26.9, adult: Secondary | ICD-10-CM | POA: Diagnosis not present

## 2016-10-02 NOTE — Telephone Encounter (Signed)
Last refill was 07/19/16

## 2016-10-18 ENCOUNTER — Other Ambulatory Visit: Payer: Self-pay | Admitting: Neurosurgery

## 2016-10-18 DIAGNOSIS — G8929 Other chronic pain: Secondary | ICD-10-CM

## 2016-10-18 DIAGNOSIS — M545 Low back pain, unspecified: Secondary | ICD-10-CM

## 2016-10-30 DIAGNOSIS — E119 Type 2 diabetes mellitus without complications: Secondary | ICD-10-CM | POA: Diagnosis not present

## 2016-10-30 DIAGNOSIS — E118 Type 2 diabetes mellitus with unspecified complications: Secondary | ICD-10-CM | POA: Diagnosis not present

## 2016-10-31 ENCOUNTER — Other Ambulatory Visit: Payer: Medicare Other

## 2016-11-13 ENCOUNTER — Ambulatory Visit
Admission: RE | Admit: 2016-11-13 | Discharge: 2016-11-13 | Disposition: A | Discharge status: Home / Self Care | Payer: Medicare Other | Source: Ambulatory Visit | Attending: Neurosurgery | Admitting: Neurosurgery

## 2016-11-13 VITALS — BP 181/85 | HR 76

## 2016-11-13 DIAGNOSIS — M545 Low back pain: Principal | ICD-10-CM

## 2016-11-13 DIAGNOSIS — M5126 Other intervertebral disc displacement, lumbar region: Secondary | ICD-10-CM | POA: Diagnosis not present

## 2016-11-13 DIAGNOSIS — G8929 Other chronic pain: Secondary | ICD-10-CM

## 2016-11-13 DIAGNOSIS — M4316 Spondylolisthesis, lumbar region: Secondary | ICD-10-CM

## 2016-11-13 MED ORDER — DIAZEPAM 5 MG PO TABS
10.0000 mg | ORAL_TABLET | Freq: Once | ORAL | Status: AC
  Administered 2016-11-13: 10 mg via ORAL

## 2016-11-13 MED ORDER — IOPAMIDOL (ISOVUE-M 200) INJECTION 41%
15.0000 mL | Freq: Once | INTRAMUSCULAR | Status: AC
  Administered 2016-11-13: 15 mL via INTRATHECAL

## 2016-11-13 MED ORDER — ONDANSETRON HCL 4 MG/2ML IJ SOLN
4.0000 mg | Freq: Once | INTRAMUSCULAR | Status: AC
  Administered 2016-11-13: 4 mg via INTRAMUSCULAR

## 2016-11-13 MED ORDER — MEPERIDINE HCL 100 MG/ML IJ SOLN
75.0000 mg | Freq: Once | INTRAMUSCULAR | Status: AC
  Administered 2016-11-13: 75 mg via INTRAMUSCULAR

## 2016-11-13 NOTE — Discharge Instructions (Signed)

## 2016-11-18 DIAGNOSIS — I10 Essential (primary) hypertension: Secondary | ICD-10-CM | POA: Diagnosis not present

## 2016-11-18 DIAGNOSIS — M545 Low back pain: Secondary | ICD-10-CM | POA: Diagnosis not present

## 2016-11-18 DIAGNOSIS — Z6826 Body mass index (BMI) 26.0-26.9, adult: Secondary | ICD-10-CM | POA: Diagnosis not present

## 2016-11-28 ENCOUNTER — Other Ambulatory Visit: Payer: Self-pay | Admitting: Family

## 2016-12-19 DIAGNOSIS — E119 Type 2 diabetes mellitus without complications: Secondary | ICD-10-CM | POA: Diagnosis not present

## 2016-12-19 DIAGNOSIS — E118 Type 2 diabetes mellitus with unspecified complications: Secondary | ICD-10-CM | POA: Diagnosis not present

## 2016-12-25 ENCOUNTER — Other Ambulatory Visit: Payer: Self-pay | Admitting: Family

## 2016-12-25 DIAGNOSIS — I10 Essential (primary) hypertension: Secondary | ICD-10-CM

## 2017-02-05 ENCOUNTER — Telehealth: Payer: Self-pay

## 2017-02-05 MED ORDER — ZOLPIDEM TARTRATE 10 MG PO TABS: 10.0000 mg | ORAL_TABLET | Freq: Every evening | ORAL | 0 refills | Status: DC | PRN

## 2017-02-05 NOTE — Telephone Encounter (Signed)
Medication printed to be faxed.  

## 2017-02-05 NOTE — Telephone Encounter (Signed)
Received fax from [facility] for Ambien. Last refill was 5/19 per [place] [facility].

## 2017-02-06 NOTE — Telephone Encounter (Signed)
Rx faxed

## 2017-02-11 DIAGNOSIS — E118 Type 2 diabetes mellitus with unspecified complications: Secondary | ICD-10-CM | POA: Diagnosis not present

## 2017-02-13 ENCOUNTER — Other Ambulatory Visit: Payer: Self-pay | Admitting: Family

## 2017-02-17 DIAGNOSIS — E119 Type 2 diabetes mellitus without complications: Secondary | ICD-10-CM | POA: Diagnosis not present

## 2017-02-26 ENCOUNTER — Other Ambulatory Visit: Payer: Self-pay | Admitting: Family

## 2017-03-10 ENCOUNTER — Ambulatory Visit (INDEPENDENT_AMBULATORY_CARE_PROVIDER_SITE_OTHER): Payer: Medicare Other | Admitting: *Deleted

## 2017-03-10 VITALS — BP 134/82 | HR 94 | Resp 20 | Ht 63.0 in | Wt 146.0 lb

## 2017-03-10 DIAGNOSIS — Z23 Encounter for immunization: Secondary | ICD-10-CM | POA: Diagnosis not present

## 2017-03-10 DIAGNOSIS — Z0000 Encounter for general adult medical examination without abnormal findings: Secondary | ICD-10-CM

## 2017-03-10 DIAGNOSIS — Z1231 Encounter for screening mammogram for malignant neoplasm of breast: Secondary | ICD-10-CM

## 2017-03-10 DIAGNOSIS — E2839 Other primary ovarian failure: Secondary | ICD-10-CM

## 2017-03-10 NOTE — Patient Instructions (Addendum)
Continue doing brain stimulating activities (puzzles, reading, adult coloring books, staying active) to keep memory sharp.   Continue to eat heart healthy diet (full of fruits, vegetables, whole grains, lean protein, water--limit salt, fat, and sugar intake) and increase physical activity as tolerated.   Tiffany [name] , Thank you for taking time to come for your Medicare Wellness Visit. I appreciate your ongoing commitment to your health goals. Please review the following plan we discussed and let me know if I can assist you in the future.   These are the goals we discussed: Goals      Patient Stated   . patient centered (pt-stated)          Wants to get back to regular schedule; move more; less pain.       Other   . <enter goal here>    . Blood Pressure < 140/90    . HEMOGLOBIN A1C < 7.0    . Increase the amount of physical          Join a sports center do water aerobics, walk more and get in the whirlpool    . LDL CALC < 100       This is a list of the screening recommended for you and due dates:  Health Maintenance  Topic Date Due  . HIV Screening  07/10/1966  . Complete foot exam   01/26/2014  . Eye exam for diabetics  08/24/2014  . Mammogram  09/18/2015  . Hemoglobin A1C  06/06/2016  . DEXA scan (bone density measurement)  07/10/2016  . Flu Shot  12/25/2016  . Lipid (cholesterol) test  03/06/2017  . Pneumonia vaccines (1 of 2 - PCV13) 03/06/2017  . Tetanus Vaccine  09/27/2018  . Colon Cancer Screening  07/22/2022  .  Hepatitis C: One time screening is recommended by Center for Disease Control  (CDC) for  adults born from [birth year] through 1964.   Completed     Influenza Virus Vaccine injection What is this medicine? INFLUENZA VIRUS VACCINE (in floo EN zuh VAHY ruhs vak SEEN) helps to reduce the risk of getting influenza also known as the flu. The vaccine only helps protect you against some strains of the flu. This medicine may be used for other purposes; ask your health  care provider or pharmacist if you have questions. COMMON BRAND NAME(S): Afluria, Agriflu, Alfuria, FLUAD, Fluarix, Fluarix Quadrivalent, Flublok, Flublok Quadrivalent, FLUCELVAX, Flulaval, Fluvirin, Fluzone, Fluzone High-Dose, Fluzone Intradermal What should I tell my health care provider before I take this medicine? They need to know if you have any of these conditions: -bleeding disorder like hemophilia -fever or infection -Guillain-Barre syndrome or other neurological problems -immune system problems -infection with the human immunodeficiency virus (HIV) or AIDS -low blood platelet counts -multiple sclerosis -an unusual or allergic reaction to influenza virus vaccine, latex, other medicines, foods, dyes, or preservatives. Different brands of vaccines contain different allergens. Some may contain latex or eggs. Talk to your doctor about your allergies to make sure that you get the right vaccine. -pregnant or trying to get pregnant -breast-feeding How should I use this medicine? This vaccine is for injection into a muscle or under the skin. It is given by a health care professional. A copy of Vaccine Information Statements will be given before each vaccination. Read this sheet carefully each time. The sheet may change frequently. Talk to your healthcare provider to see which vaccines are right for you. Some vaccines should not be used in all age groups.  Overdosage: If you think you have taken too much of this medicine contact a poison control center or emergency room at once. NOTE: This medicine is only for you. Do not share this medicine with others. What if I miss a dose? This does not apply. What may interact with this medicine? -chemotherapy or radiation therapy -medicines that lower your immune system like etanercept, anakinra, infliximab, and adalimumab -medicines that treat or prevent blood clots like warfarin -phenytoin -steroid medicines like prednisone or  cortisone -theophylline -vaccines This list may not describe all possible interactions. Give your health care provider a list of all the medicines, herbs, non-prescription drugs, or dietary supplements you use. Also tell them if you smoke, drink alcohol, or use illegal drugs. Some items may interact with your medicine. What should I watch for while using this medicine? Report any side effects that do not go away within 3 days to your doctor or health care professional. Call your health care provider if any unusual symptoms occur within 6 weeks of receiving this vaccine. You may still catch the flu, but the illness is not usually as bad. You cannot get the flu from the vaccine. The vaccine will not protect against colds or other illnesses that may cause fever. The vaccine is needed every year. What side effects may I notice from receiving this medicine? Side effects that you should report to your doctor or health care professional as soon as possible: -allergic reactions like skin rash, itching or hives, swelling of the face, lips, or tongue Side effects that usually do not require medical attention (report to your doctor or health care professional if they continue or are bothersome): -fever -headache -muscle aches and pains -pain, tenderness, redness, or swelling at the injection site -tiredness This list may not describe all possible side effects. Call your doctor for medical advice about side effects. You may report side effects to FDA at 1-800-FDA-1088. Where should I keep my medicine? The vaccine will be given by a health care professional in a clinic, pharmacy, doctor's office, or other health care setting. You will not be given vaccine doses to store at home. NOTE: This sheet is a summary. It may not cover all possible information. If you have questions about this medicine, talk to your doctor, pharmacist, or health care provider.  2018 Elsevier/Gold Standard (2014-12-02 10:07:28)

## 2017-03-10 NOTE — Progress Notes (Addendum)
Subjective:   Tiffany [name] is a 66 y.o. female who presents for Medicare Annual (Subsequent) preventive examination.  Review of Systems:  No ROS.  Medicare Wellness Visit. Additional risk factors are reflected in the social history.  Cardiac Risk Factors include: advanced age (>39mn, >>55women);diabetes mellitus;dyslipidemia;hypertension Sleep patterns: does not get up to void, gets up 1-2 times nightly to void and sleeps 2-4 hours nightly.  Patient reports insomnia issues, discussed recommended sleep tips and stress reduction tips.   Home Safety/Smoke Alarms: Feels safe in home. Smoke alarms in place.  Living environment; residence and Firearm Safety: no firearms. Seat Belt Safety/Bike Helmet: Wears seat belt.     Objective:     Vitals: BP 134/82   Pulse 94   Resp 20   Ht 5' 3"  (1.6 m)   Wt 146 lb (66.2 kg)   SpO2 98%   BMI 25.86 kg/m   Body mass index is 25.86 kg/m.   Tobacco History  Smoking Status  . Former Smoker  . Packs/day: 0.50  . Years: 15.00  . Quit date: 08/02/1990  Smokeless Tobacco  . Never Used     Counseling given: Not Answered   Past Medical History:  Diagnosis Date  . Degenerative joint disease   . Diabetes mellitus   . Headache   . Hyperlipidemia   . Hypertension   . Sleep apnea    stopped using it   Past Surgical History:  Procedure Laterality Date  . ABDOMINAL HYSTERECTOMY  1976  . BACK SURGERY    . KNEE ARTHROSCOPY     left x 2  . LUMBAR LAMINECTOMY  1989   Family History  Problem Relation Age of Onset  . Hypertension Mother   . Diabetes Mother   . Thyroid disease Mother   . Heart disease Mother   . Kidney disease Mother   . Diabetes Sister   . Hypertension Brother   . Stroke Brother   . Heart disease Brother   . Colon cancer Neg Hx   . Colon polyps Neg Hx   . Rectal cancer Neg Hx   . Stomach cancer Neg Hx    History  Sexual Activity  . Sexual activity: No    Outpatient Encounter Prescriptions as of 03/10/2017   Medication Sig  . ACCU-CHEK SOFTCLIX LANCETS lancets Use to check blood sugars 1-4 times daily as needed. Dx code: E11.9  . amLODipine (NORVASC) 5 MG tablet TAKE 1 TABLET (5 MG TOTAL) BY MOUTH DAILY.  .Marland KitchenBlood Glucose Monitoring Suppl (ACCU-CHEK NANO SMARTVIEW) W/DEVICE KIT Use to check blood sugar as directed up to 3 times a day. diag code 250.02. Insulin dependent  . gabapentin (NEURONTIN) 300 MG capsule Take 1 capsule (300 mg total) by mouth 3 (three) times daily.  .Marland Kitchenglimepiride (AMARYL) 4 MG tablet Take 1 tablet (4 mg total) by mouth daily with breakfast. Needs office visit for more refills.  .Marland Kitchenglucose blood (ACCU-CHEK AVIVA PLUS) test strip 1 each by Other route 3 (three) times daily. Use to check blood sugars three times a day Dx E11.8  . Insulin Pen Needle 32G X 4 MM MISC Use to inject  lantus once daily  . oxyCODONE-acetaminophen (PERCOCET) 7.5-325 MG tablet Take 1 tablet by mouth every 4 (four) hours as needed for severe pain.  .Marland Kitchenquinapril (ACCUPRIL) 40 MG tablet TAKE 1 TABLET (40 MG TOTAL) BY MOUTH DAILY.  . ranitidine (ZANTAC) 150 MG tablet Take 150 mg by mouth 2 (two) times daily as  needed for heartburn.  . zolpidem (AMBIEN) 10 MG tablet Take 1 tablet (10 mg total) by mouth at bedtime as needed.  . Insulin Glargine (LANTUS SOLOSTAR) 100 UNIT/ML Solostar Pen Inject 30 Units into the skin daily at 10 pm. Annual appt due in Oct must see provider for future refills (Patient taking differently: Inject 36 Units into the skin daily at 10 pm. Annual appt due in Oct must see provider for future refills)  . [DISCONTINUED] Insulin Glargine (LANTUS SOLOSTAR) 100 UNIT/ML Solostar Pen Inject 40 Units into the skin daily at 10 pm.   No facility-administered encounter medications on file as of 03/10/2017.     Activities of Daily Living In your present state of health, do you have any difficulty performing the following activities: 03/10/2017  Hearing? N  Vision? N  Difficulty concentrating or  making decisions? N  Walking or climbing stairs? N  Dressing or bathing? N  Doing errands, shopping? N  Preparing Food and eating ? N  Using the Toilet? N  In the past six months, have you accidently leaked urine? N  Do you have problems with loss of bowel control? N  Managing your Medications? N  Managing your Finances? N  Housekeeping or managing your Housekeeping? N  Some recent data might be hidden    Patient Care Team: Golden Circle, FNP as PCP - General (Family Medicine)    Assessment:    Physical assessment deferred to PCP.  Exercise Activities and Dietary recommendations Current Exercise Habits: Home exercise routine, Type of exercise: walking, Time (Minutes): 30, Frequency (Times/Week): 3, Weekly Exercise (Minutes/Week): 90, Intensity: Mild, Exercise limited by: orthopedic condition(s)  Diet (meal preparation, eat out, water intake, caffeinated beverages, dairy products, fruits and vegetables): in general, a "healthy" diet    Reviewed heart healthy and diabetic diet, encouraged patient to increase daily water intake. Patient states she has diet education at home.    Goals      Patient Stated   . patient centered (pt-stated)          Wants to get back to regular schedule; move more; less pain.       Other   . <enter goal here>    . Blood Pressure < 140/90    . HEMOGLOBIN A1C < 7.0    . Increase the amount of physical          Join a sports center do water aerobics, walk more and get in the whirlpool    . LDL CALC < 100      Fall Risk Fall Risk  03/10/2017 02/07/2015 11/03/2014 01/10/2014 12/30/2013  Falls in the past year? No No No No Yes  Comment - - - - -  Number falls in past yr: - - - - 2 or more  Risk for fall due to : - Impaired balance/gait Other (Comment) - History of fall(s);Impaired balance/gait   Depression Screen PHQ 2/9 Scores 03/10/2017 03/06/2016 02/07/2015 11/03/2014  PHQ - 2 Score 0 0 0 0  Exception Documentation - - - Other- indicate  reason in comment box     Cognitive Function MMSE - Mini Mental State Exam 03/10/2017 02/07/2015  Not completed: - Refused  Orientation to time 5 -  Orientation to Place 5 -  Registration 3 -  Attention/ Calculation 5 -  Recall 2 -  Language- name 2 objects 2 -  Language- repeat 1 -  Language- follow 3 step command 3 -  Language- read & follow direction 1 -  Write a sentence 1 -  Copy design 1 -  Total score 29 -        Immunization History  Administered Date(s) Administered  . Influenza Split 06/08/2012  . Influenza Whole 05/25/2007, 03/17/2008, 03/27/2009  . Influenza, High Dose Seasonal PF 03/10/2017  . Influenza,inj,Quad PF,6+ Mos 02/07/2015, 03/06/2016  . Pneumococcal Polysaccharide-23 06/27/2004, 03/06/2016  . Td 09/26/2008  . Zoster 02/14/2014   Screening Tests Health Maintenance  Topic Date Due  . HIV Screening  07/10/1966  . FOOT EXAM  01/26/2014  . OPHTHALMOLOGY EXAM  08/24/2014  . MAMMOGRAM  09/18/2015  . HEMOGLOBIN A1C  06/06/2016  . DEXA SCAN  07/10/2016  . INFLUENZA VACCINE  12/25/2016  . LIPID PANEL  03/06/2017  . PNA vac Low Risk Adult (1 of 2 - PCV13) 03/06/2017  . TETANUS/TDAP  09/27/2018  . COLONOSCOPY  07/22/2022  . Hepatitis C Screening  Completed      Plan:  Continue doing brain stimulating activities (puzzles, reading, adult coloring books, staying active) to keep memory sharp.   Continue to eat heart healthy diet (full of fruits, vegetables, whole grains, lean protein, water--limit salt, fat, and sugar intake) and increase physical activity as tolerated.   I have personally reviewed and noted the following in the patient's chart:   . Medical and social history . Use of alcohol, tobacco or illicit drugs  . Current medications and supplements . Functional ability and status . Nutritional status . Physical activity . Advanced directives . List of other physicians . Vitals . Screenings to include cognitive, depression, and  falls . Referrals and appointments  In addition, I have reviewed and discussed with patient certain preventive protocols, quality metrics, and best practice recommendations. A written personalized care plan for preventive services as well as general preventive health recommendations were provided to patient.     Michiel Cowboy, RN  03/10/2017  Medical screening examination/treatment/procedure(s) were performed by non-physician practitioner and as supervising physician I was immediately available for consultation/collaboration. I agree with above. Cathlean Cower, MD

## 2017-03-10 NOTE — Progress Notes (Signed)
Pre visit review using our clinic review tool, if applicable. No additional management support is needed unless otherwise documented below in the visit note. 

## 2017-03-11 ENCOUNTER — Other Ambulatory Visit: Payer: Self-pay

## 2017-03-11 ENCOUNTER — Telehealth: Payer: Self-pay | Admitting: *Deleted

## 2017-03-11 DIAGNOSIS — E119 Type 2 diabetes mellitus without complications: Secondary | ICD-10-CM

## 2017-03-11 MED ORDER — INSULIN GLARGINE 100 UNIT/ML SOLOSTAR PEN: 36.0000 [IU] | PEN_INJECTOR | Freq: Every day | SUBCUTANEOUS | 0 refills | Status: DC

## 2017-03-11 NOTE — Telephone Encounter (Signed)
Referral done

## 2017-03-11 NOTE — Telephone Encounter (Signed)
During AWV, patient requested an ophthalmologist referral to evaluate retinopathy.

## 2017-03-13 ENCOUNTER — Other Ambulatory Visit: Payer: Self-pay | Admitting: Internal Medicine

## 2017-03-13 DIAGNOSIS — Z1231 Encounter for screening mammogram for malignant neoplasm of breast: Secondary | ICD-10-CM

## 2017-03-20 ENCOUNTER — Other Ambulatory Visit: Payer: Self-pay | Admitting: *Deleted

## 2017-03-20 MED ORDER — INSULIN GLARGINE 100 UNIT/ML SOLOSTAR PEN: 36.0000 [IU] | PEN_INJECTOR | Freq: Every day | SUBCUTANEOUS | 0 refills | Status: DC

## 2017-03-27 ENCOUNTER — Ambulatory Visit (INDEPENDENT_AMBULATORY_CARE_PROVIDER_SITE_OTHER): Payer: Medicare Other | Admitting: Internal Medicine

## 2017-03-27 ENCOUNTER — Other Ambulatory Visit (INDEPENDENT_AMBULATORY_CARE_PROVIDER_SITE_OTHER): Payer: Medicare Other

## 2017-03-27 ENCOUNTER — Encounter: Payer: Self-pay | Admitting: Internal Medicine

## 2017-03-27 VITALS — BP 142/90 | HR 106 | Temp 98.2°F | Ht 63.0 in | Wt 145.0 lb

## 2017-03-27 DIAGNOSIS — IMO0001 Reserved for inherently not codable concepts without codable children: Secondary | ICD-10-CM

## 2017-03-27 DIAGNOSIS — R778 Other specified abnormalities of plasma proteins: Secondary | ICD-10-CM | POA: Diagnosis not present

## 2017-03-27 DIAGNOSIS — Z0001 Encounter for general adult medical examination with abnormal findings: Secondary | ICD-10-CM

## 2017-03-27 DIAGNOSIS — L84 Corns and callosities: Secondary | ICD-10-CM | POA: Diagnosis not present

## 2017-03-27 DIAGNOSIS — M25512 Pain in left shoulder: Secondary | ICD-10-CM | POA: Diagnosis not present

## 2017-03-27 DIAGNOSIS — Z0000 Encounter for general adult medical examination without abnormal findings: Secondary | ICD-10-CM

## 2017-03-27 DIAGNOSIS — Z114 Encounter for screening for human immunodeficiency virus [HIV]: Secondary | ICD-10-CM

## 2017-03-27 DIAGNOSIS — M25511 Pain in right shoulder: Secondary | ICD-10-CM

## 2017-03-27 DIAGNOSIS — E1165 Type 2 diabetes mellitus with hyperglycemia: Secondary | ICD-10-CM

## 2017-03-27 DIAGNOSIS — E559 Vitamin D deficiency, unspecified: Secondary | ICD-10-CM | POA: Diagnosis not present

## 2017-03-27 LAB — CBC WITH DIFFERENTIAL/PLATELET
BASOS ABS: 0 10*3/uL (ref 0.0–0.1)
Basophils Relative: 0.7 % (ref 0.0–3.0)
EOS ABS: 0.1 10*3/uL (ref 0.0–0.7)
Eosinophils Relative: 1.5 % (ref 0.0–5.0)
HEMATOCRIT: 42.3 % (ref 36.0–46.0)
Hemoglobin: 13.9 g/dL (ref 12.0–15.0)
LYMPHS PCT: 32.9 % (ref 12.0–46.0)
Lymphs Abs: 1.2 10*3/uL (ref 0.7–4.0)
MCHC: 32.9 g/dL (ref 30.0–36.0)
MCV: 88.1 fl (ref 78.0–100.0)
MONOS PCT: 4.3 % (ref 3.0–12.0)
Monocytes Absolute: 0.2 10*3/uL (ref 0.1–1.0)
Neutro Abs: 2.2 10*3/uL (ref 1.4–7.7)
Neutrophils Relative %: 60.6 % (ref 43.0–77.0)
Platelets: 270 10*3/uL (ref 150.0–400.0)
RBC: 4.8 Mil/uL (ref 3.87–5.11)
RDW: 13.6 % (ref 11.5–15.5)
WBC: 3.7 10*3/uL — AB (ref 4.0–10.5)

## 2017-03-27 LAB — LIPID PANEL
CHOL/HDL RATIO: 4
Cholesterol: 215 mg/dL — ABNORMAL HIGH (ref 0–200)
HDL: 56.8 mg/dL (ref >39.00)
LDL Cholesterol: 145 mg/dL — ABNORMAL HIGH (ref 0–99)
NonHDL: 158.27
TRIGLYCERIDES: 64 mg/dL (ref 0.0–149.0)
VLDL: 12.8 mg/dL (ref 0.0–40.0)

## 2017-03-27 LAB — VITAMIN D 25 HYDROXY (VIT D DEFICIENCY, FRACTURES): VITD: 30.41 ng/mL (ref 30.00–100.00)

## 2017-03-27 LAB — BASIC METABOLIC PANEL
BUN: 11 mg/dL (ref 6–23)
CALCIUM: 9.7 mg/dL (ref 8.4–10.5)
CHLORIDE: 100 meq/L (ref 96–112)
CO2: 28 mEq/L (ref 19–32)
CREATININE: 0.59 mg/dL (ref 0.40–1.20)
GFR: 131.27 mL/min (ref >60.00)
GLUCOSE: 173 mg/dL — AB (ref 70–99)
Potassium: 4.3 mEq/L (ref 3.5–5.1)
Sodium: 136 mEq/L (ref 135–145)

## 2017-03-27 LAB — URINALYSIS, ROUTINE W REFLEX MICROSCOPIC
BILIRUBIN URINE: NEGATIVE
HGB URINE DIPSTICK: NEGATIVE
Ketones, ur: NEGATIVE
Nitrite: NEGATIVE
PH: 5.5 (ref 5.0–8.0)
Specific Gravity, Urine: 1.01 (ref 1.000–1.030)
TOTAL PROTEIN, URINE-UPE24: NEGATIVE
URINE GLUCOSE: NEGATIVE
UROBILINOGEN UA: 0.2 (ref 0.0–1.0)

## 2017-03-27 LAB — HEPATIC FUNCTION PANEL
ALBUMIN: 4.2 g/dL (ref 3.5–5.2)
ALT: 16 U/L (ref 0–35)
AST: 15 U/L (ref 0–37)
Alkaline Phosphatase: 86 U/L (ref 39–117)
Bilirubin, Direct: 0.1 mg/dL (ref 0.0–0.3)
TOTAL PROTEIN: 9.2 g/dL — AB (ref 6.0–8.3)
Total Bilirubin: 0.4 mg/dL (ref 0.2–1.2)

## 2017-03-27 LAB — SEDIMENTATION RATE: Sed Rate: 69 mm/hr — ABNORMAL HIGH (ref 0–30)

## 2017-03-27 LAB — MICROALBUMIN / CREATININE URINE RATIO
Creatinine,U: 51.2 mg/dL
MICROALB/CREAT RATIO: 1.4 mg/g (ref 0.0–30.0)
Microalb, Ur: <0.7 mg/dL (ref 0.0–1.9)

## 2017-03-27 LAB — HEMOGLOBIN A1C: Hgb A1c MFr Bld: 9.9 % — ABNORMAL HIGH (ref 4.6–6.5)

## 2017-03-27 LAB — TSH: TSH: 1.34 u[IU]/mL (ref 0.35–4.50)

## 2017-03-27 MED ORDER — GLIMEPIRIDE 4 MG PO TABS: 4.0000 mg | ORAL_TABLET | Freq: Every day | ORAL | 3 refills | Status: DC

## 2017-03-27 NOTE — Progress Notes (Signed)
Subjective:    Patient ID: Tiffany [name], female    DOB: [birth year], 66 y.o.   MRN: [id number]  HPI  Here for wellness and f/u;  Overall doing ok;  Pt denies Chest pain, worsening SOB, DOE, wheezing, orthopnea, PND, worsening LE edema, palpitations, dizziness or syncope.  Pt denies neurological change such as new headache, facial or extremity weakness.  Pt denies polydipsia, polyuria, or low sugar symptoms. Pt states overall good compliance with treatment and medications, good tolerability, and has been trying to follow appropriate diet.  Pt denies worsening depressive symptoms, suicidal ideation or panic. No fever, night sweats, wt loss, loss of appetite, or other constitutional symptoms.  Pt states good ability with ADL's, has low fall risk, home safety reviewed and adequate, no other significant changes in hearing or vision, and only occasionally active with exercise.  Has appt Nov 8 for mammogram and DXA., and optho appt for dec 2018.Marland Kitchen  Has not been able to take statins due to intolerance in the past.  C/o bilat shoulder pain with soreness to ROM but still FROM.  Past Medical History:  Diagnosis Date  . Degenerative joint disease   . Diabetes mellitus   . Headache   . Hyperlipidemia   . Hypertension   . Sleep apnea    stopped using it   Past Surgical History:  Procedure Laterality Date  . ABDOMINAL HYSTERECTOMY  1976  . BACK SURGERY    . KNEE ARTHROSCOPY     left x 2  . LUMBAR LAMINECTOMY  1989    reports that she quit smoking about 26 years ago. She has a 7.50 pack-year smoking history. She has never used smokeless tobacco. She reports that she does not drink alcohol or use drugs. family history includes Diabetes in her mother and sister; Heart disease in her brother and mother; Hypertension in her brother and mother; Kidney disease in her mother; Stroke in her brother; Thyroid disease in her mother. Allergies  Allergen Reactions  . Aspirin Nausea And Vomiting    Can take EC asa    . Contrast Media [Iodinated Diagnostic Agents] Nausea And Vomiting  . Metformin And Related Nausea And Vomiting    Severe nausea and vomiting  . Shellfish Allergy Nausea And Vomiting  . Tramadol Nausea And Vomiting and Other (See Comments)    Migraine  . Penicillins   . Statins Other (See Comments)    myalgias   Current Outpatient Prescriptions on File Prior to Visit  Medication Sig Dispense Refill  . ACCU-CHEK SOFTCLIX LANCETS lancets Use to check blood sugars 1-4 times daily as needed. Dx code: E11.9 100 each 12  . amLODipine (NORVASC) 5 MG tablet TAKE 1 TABLET (5 MG TOTAL) BY MOUTH DAILY. 90 tablet 3  . Blood Glucose Monitoring Suppl (ACCU-CHEK NANO SMARTVIEW) W/DEVICE KIT Use to check blood sugar as directed up to 3 times a day. diag code 250.02. Insulin dependent 1 kit 0  . gabapentin (NEURONTIN) 300 MG capsule Take 1 capsule (300 mg total) by mouth 3 (three) times daily. 90 capsule 3  . glucose blood (ACCU-CHEK AVIVA PLUS) test strip 1 each by Other route 3 (three) times daily. Use to check blood sugars three times a day Dx E11.8 100 each 5  . Insulin Pen Needle 32G X 4 MM MISC Use to inject  lantus once daily 100 each 4  . oxyCODONE-acetaminophen (PERCOCET) 7.5-325 MG tablet Take 1 tablet by mouth every 4 (four) hours as needed for severe pain.    Marland Kitchen  ranitidine (ZANTAC) 150 MG tablet Take 150 mg by mouth 2 (two) times daily as needed for heartburn.    . zolpidem (AMBIEN) 10 MG tablet Take 1 tablet (10 mg total) by mouth at bedtime as needed. 30 tablet 0   No current facility-administered medications on file prior to visit.    Review of Systems Constitutional: Negative for other unusual diaphoresis, sweats, appetite or weight changes HENT: Negative for other worsening hearing loss, ear pain, facial swelling, mouth sores or neck stiffness.   Eyes: Negative for other worsening pain, redness or other visual disturbance.  Respiratory: Negative for other stridor or  swelling Cardiovascular: Negative for other palpitations or other chest pain  Gastrointestinal: Negative for worsening diarrhea or loose stools, blood in stool, distention or other pain Genitourinary: Negative for hematuria, flank pain or other change in urine volume.  Musculoskeletal: Negative for myalgias or other joint swelling.  Skin: Negative for other color change, or other wound or worsening drainage.  Neurological: Negative for other syncope or numbness. Hematological: Negative for other adenopathy or swelling Psychiatric/Behavioral: Negative for hallucinations, other worsening agitation, SI, self-injury, or new decreased concentration All other system neg per pt    Objective:   Physical Exam BP (!) 142/90   Pulse (!) 106   Temp 98.2 F (36.8 C) (Oral)   Ht _0  (1.6 m)   Wt 145 lb (65.8 kg)   SpO2 98%   BMI 25.69 kg/m  VS noted,  Constitutional: Pt is oriented to person, place, and time. Appears well-developed and well-nourished, in no significant distress and comfortable Head: Normocephalic and atraumatic  Eyes: Conjunctivae and EOM are normal. Pupils are equal, round, and reactive to light Right Ear: External ear normal without discharge Left Ear: External ear normal without discharge Nose: Nose without discharge or deformity Mouth/Throat: Oropharynx is without other ulcerations and moist  Neck: Normal range of motion. Neck supple. No JVD present. No tracheal deviation present or significant neck LA or mass Cardiovascular: Normal rate, regular rhythm, normal heart sounds and intact distal pulses.   Pulmonary/Chest: WOB normal and breath sounds without rales or wheezing  Abdominal: Soft. Bowel sounds are normal. NT. No HSM  Musculoskeletal: Normal range of motion. Exhibits no edema Lymphadenopathy: Has no other cervical adenopathy.  Neurological: Pt is alert and oriented to person, place, and time. Pt has normal reflexes. No cranial nerve deficit. Motor grossly intact,  Gait intact Skin: Skin is warm and dry. No rash noted or new ulcerations Psychiatric:  Has normal mood and affect. Behavior is normal without agitation No other exam findings  Lab Results  Component Value Date   WBC 5.7 03/06/2016   HGB 13.9 03/06/2016   HCT 41.0 03/06/2016   PLT 282.0 03/06/2016   GLUCOSE 175 (H) 03/06/2016   CHOL 239 (H) 03/06/2016   TRIG 59.0 03/06/2016   HDL 57.80 03/06/2016   LDLCALC 170 (H) 03/06/2016   ALT 17 03/06/2016   AST 14 03/06/2016   NA 137 03/06/2016   K 4.7 03/06/2016   CL 101 03/06/2016   CREATININE 0.74 03/06/2016   BUN 11 03/06/2016   CO2 26 03/06/2016   HGBA1C 9.1 (H) 03/06/2016   MICROALBUR 3.0 (H) 03/06/2016       Assessment & Plan:

## 2017-03-27 NOTE — Patient Instructions (Addendum)
You will be contacted regarding the referral for: podiatry  Please continue all other medications as before, and refills have been done if requested, and remember to take the Vit D3 OTC - 2000 units per day.  Please have the pharmacy call with any other refills you may need.  Please continue your efforts at being more active, low cholesterol diet, and weight control.  You are otherwise up to date with prevention measures today.  Please keep your appointments with your specialists as you may have planned  Please go to the LAB in the Basement (turn left off the elevator) for the tests to be done today  You will be contacted by phone if any changes need to be made immediately.  Otherwise, you will receive a letter about your results with an explanation, but please check with MyChart first.  Please remember to sign up for MyChart if you have not done so, as this will be important to you in the future with finding out test results, communicating by private email, and scheduling acute appointments online when needed.  Please return in 6 months, or sooner if needed, with Lab testing done 3-5 days before

## 2017-03-28 ENCOUNTER — Other Ambulatory Visit: Payer: Self-pay | Admitting: Internal Medicine

## 2017-03-28 ENCOUNTER — Telehealth: Payer: Self-pay

## 2017-03-28 ENCOUNTER — Other Ambulatory Visit: Payer: Self-pay | Admitting: *Deleted

## 2017-03-28 DIAGNOSIS — I10 Essential (primary) hypertension: Secondary | ICD-10-CM

## 2017-03-28 MED ORDER — INSULIN GLARGINE 100 UNIT/ML SOLOSTAR PEN: 50.0000 [IU] | PEN_INJECTOR | Freq: Every day | SUBCUTANEOUS | 3 refills | Status: DC

## 2017-03-28 MED ORDER — QUINAPRIL HCL 40 MG PO TABS: 40.0000 mg | ORAL_TABLET | Freq: Every day | ORAL | 3 refills | Status: DC

## 2017-03-28 MED ORDER — INSULIN GLARGINE 100 UNIT/ML SOLOSTAR PEN: 50.0000 [IU] | PEN_INJECTOR | Freq: Every day | SUBCUTANEOUS | 5 refills | Status: DC

## 2017-03-28 NOTE — Telephone Encounter (Signed)
Pt has been informed and expressed understanding.  

## 2017-03-28 NOTE — Telephone Encounter (Signed)
-----   Message from Biagio Borg, MD sent at 03/28/2017  1:34 PM EDT ----- Left message on MyChart, pt to cont same tx except  The test results show that your current treatment is OK, except the A1c is still quite high.  Please increase the lantus to 50 units per day, as this is needed to help get the A1c < 7.    Taquita Demby to please inform pt, I will do rx

## 2017-03-29 NOTE — Assessment & Plan Note (Signed)
Also for Vit D f/u

## 2017-03-29 NOTE — Assessment & Plan Note (Signed)
stable overall by history and exam, recent data reviewed with pt, and pt to continue medical treatment as before,  to f/u any worsening symptoms or concerns, for f/u labs 

## 2017-03-29 NOTE — Assessment & Plan Note (Signed)
?   DJD vs other - pt requests sport med referral

## 2017-03-29 NOTE — Assessment & Plan Note (Signed)
Mild, for SPEP

## 2017-03-29 NOTE — Assessment & Plan Note (Signed)

## 2017-03-29 NOTE — Assessment & Plan Note (Signed)
Also for podiatry referral 

## 2017-04-01 ENCOUNTER — Telehealth: Payer: Self-pay

## 2017-04-01 MED ORDER — GLUCOSE BLOOD VI STRP: 1.0000 | ORAL_STRIP | Freq: Three times a day (TID) | 5 refills | Status: DC

## 2017-04-01 NOTE — Telephone Encounter (Signed)
Refill

## 2017-04-02 DIAGNOSIS — E118 Type 2 diabetes mellitus with unspecified complications: Secondary | ICD-10-CM | POA: Diagnosis not present

## 2017-04-03 ENCOUNTER — Ambulatory Visit
Admission: RE | Admit: 2017-04-03 | Discharge: 2017-04-03 | Disposition: A | Discharge status: Home / Self Care | Payer: Medicare Other | Source: Ambulatory Visit | Attending: Internal Medicine | Admitting: Internal Medicine

## 2017-04-03 DIAGNOSIS — M85852 Other specified disorders of bone density and structure, left thigh: Secondary | ICD-10-CM | POA: Diagnosis not present

## 2017-04-03 DIAGNOSIS — E2839 Other primary ovarian failure: Secondary | ICD-10-CM

## 2017-04-03 DIAGNOSIS — Z1231 Encounter for screening mammogram for malignant neoplasm of breast: Secondary | ICD-10-CM | POA: Diagnosis not present

## 2017-04-03 DIAGNOSIS — Z780 Asymptomatic menopausal state: Secondary | ICD-10-CM | POA: Diagnosis not present

## 2017-04-03 LAB — PROTEIN ELECTROPHORESIS, SERUM, WITH REFLEX
Albumin ELP: 4.2 g/dL (ref 3.8–4.8)
Alpha 1: 0.4 g/dL — ABNORMAL HIGH (ref 0.2–0.3)
Alpha 2: 1.1 g/dL — ABNORMAL HIGH (ref 0.5–0.9)
Beta 2: 0.6 g/dL — ABNORMAL HIGH (ref 0.2–0.5)
Beta Globulin: 0.5 g/dL (ref 0.4–0.6)
Gamma Globulin: 2 g/dL — ABNORMAL HIGH (ref 0.8–1.7)
TOTAL PROTEIN: 8.8 g/dL — AB (ref 6.1–8.1)

## 2017-04-03 LAB — IFE INTERPRETATION: IMMUNOFIX ELECTR INT: NOT DETECTED

## 2017-04-03 LAB — HIV ANTIBODY (ROUTINE TESTING W REFLEX): HIV 1&2 Ab, 4th Generation: NONREACTIVE

## 2017-04-14 NOTE — Progress Notes (Signed)
[facility] [place],  [address] Phone: [phone] Subjective:    I'm seeing this patient by the request  of:  Biagio Borg, MD   CC: Bilateral shoulder pain  [id number]  Tiffany [name] is a 66 y.o. female coming in with complaint of bilateral shoulder pain. She explains she gets pain in her chest, cervical/thoracic back and axillary. She also has neck pain. She has a history of back surgery. She has trouble lifting overhead.   Onset- Chronic Location-bilateral shoulders Duration- All the time Character- Stabbing pain Aggravating factors- lifting, sweeping Reliving factors- Heat compress Therapies tried- Ice, heat compress, oral meds.  Severity-19-10.  Feels that pain medications would be the only thing that would be beneficial.     Past Medical History:  Diagnosis Date  . Degenerative joint disease   . Diabetes mellitus   . Headache   . Hyperlipidemia   . Hypertension   . Sleep apnea    stopped using it   Past Surgical History:  Procedure Laterality Date  . ABDOMINAL HYSTERECTOMY  1976  . BACK SURGERY    . KNEE ARTHROSCOPY     left x 2  . LUMBAR LAMINECTOMY  1989   Social History   Socioeconomic History  . Marital status: Single    Spouse name: None  . Number of children: None  . Years of education: 8  . Highest education level: None  Social Needs  . Financial resource strain: None  . Food insecurity - worry: None  . Food insecurity - inability: None  . Transportation needs - medical: None  . Transportation needs - non-medical: None  Occupational History  . Occupation: Disability  Tobacco Use  . Smoking status: Former Smoker    Packs/day: 0.50    Years: 15.00    Pack years: 7.50    Last attempt to quit: 08/02/1990    Years since quitting: 26.7  . Smokeless tobacco: Never Used  Substance and Sexual Activity  . Alcohol use: No    Alcohol/week: 0.0 oz  . Drug use: No  . Sexual activity: No    Other Topics Concern  . None  Social History Narrative   Born in [place], [place].   Completed the 8th grade   Fun: Walk, crochet, puzzles   Diet - Variety of foods, does not eat breakfast   2 children - 44 and 45    6 grandchildren - 7 great grandchildren.    Allergies  Allergen Reactions  . Aspirin Nausea And Vomiting    Can take EC asa  . Contrast Media [Iodinated Diagnostic Agents] Nausea And Vomiting  . Metformin And Related Nausea And Vomiting    Severe nausea and vomiting  . Shellfish Allergy Nausea And Vomiting  . Tramadol Nausea And Vomiting and Other (See Comments)    Migraine  . Penicillins   . Statins Other (See Comments)    myalgias   Family History  Problem Relation Age of Onset  . Hypertension Mother   . Diabetes Mother   . Thyroid disease Mother   . Heart disease Mother   . Kidney disease Mother   . Diabetes Sister   . Hypertension Brother   . Stroke Brother   . Heart disease Brother   . Colon cancer Neg Hx   . Colon polyps Neg Hx   . Rectal cancer Neg Hx   . Stomach cancer Neg Hx      Past medical history, social,  surgical and family history all reviewed in electronic medical record.  No pertanent information unless stated regarding to the chief complaint.   Review of Systems:Review of systems updated and as accurate as of 04/15/17  No headache, visual changes, nausea, vomiting, diarrhea, constipation, dizziness, abdominal pain, skin rash, fevers, chills, night sweats, weight loss, swollen lymph nodes,  joint swelling,  chest pain, shortness of breath, mood changes.  Positive body aches, muscle aches  Objective  Blood pressure (!) 152/90, pulse 98, height 5\' 2"  (1.575 m), weight 147 lb (66.7 kg), SpO2 96 %. Systems examined below as of 04/15/17   General: No apparent distress alert and oriented x3 mood and affect normal, dressed appropriately.  HEENT: Pupils equal, extraocular movements intact  Respiratory: Patient's speak in full sentences and  does not appear short of breath  Cardiovascular: No lower extremity edema, non tender, no erythema  Skin: Warm dry intact with no signs of infection or rash on extremities or on axial skeleton.  Abdomen: Soft moderately tender Neuro: Cranial nerves II through XII are intact, neurovascularly intact in all extremities with 2+ DTRs and 2+ pulses.  Lymph: No lymphadenopathy of posterior or anterior cervical chain or axillae bilaterally.  Gait slow and cautious MSK: Diffuse tender with full range of motion and good stability and symmetric strength and tone of  elbows, wrist, hip, knee and ankles bilaterally.  Patient somewhat noncompliant with the different activities and lifting the arms. Shoulder: Bilateral Inspection reveals no abnormalities, atrophy or asymmetry. Palpation is normal with no tenderness over AC joint or bicipital groove. ROM is full in all planes. Rotator cuff strength normal throughout. Impingement Speeds and Yergason's tests normal. No labral pathology noted with negative Obrien's, negative clunk and good stability. Normal scapular function observed. No painful arc and no drop arm sign. No apprehension sign  Procedure 97110; 15 additional minutes spent for Therapeutic exercises as stated in above notes.  This included exercises focusing on stretching, strengthening, with significant focus on eccentric aspects.   Long term goals include an improvement in range of motion, strength, endurance as well as avoiding reinjury. Patient's frequency would include in 1-2 times a day, 3-5 times a week for a duration of 6-12 weeks. Exercises that included:  Basic scapular stabilization to include adduction and depression of scapula Scaption, focusing on proper movement and good control Internal and External rotation utilizing a theraband, with elbow tucked at side entire time Rows with theraband which was given   Proper technique shown and discussed handout in great detail with ATC.  All  questions were discussed and answered.     Impression and Recommendations:     This case required medical decision making of moderate complexity.      Note: This dictation was prepared with Dragon dictation along with smaller phrase technology. Any transcriptional errors that result from this process are unintentional.

## 2017-04-15 ENCOUNTER — Ambulatory Visit: Payer: Medicare Other | Admitting: Family Medicine

## 2017-04-15 ENCOUNTER — Telehealth: Payer: Self-pay

## 2017-04-15 ENCOUNTER — Ambulatory Visit (INDEPENDENT_AMBULATORY_CARE_PROVIDER_SITE_OTHER)
Admission: RE | Admit: 2017-04-15 | Discharge: 2017-04-15 | Disposition: A | Discharge status: Home / Self Care | Payer: Medicare Other | Source: Ambulatory Visit | Attending: Family Medicine | Admitting: Family Medicine

## 2017-04-15 ENCOUNTER — Other Ambulatory Visit (INDEPENDENT_AMBULATORY_CARE_PROVIDER_SITE_OTHER): Payer: Medicare Other

## 2017-04-15 ENCOUNTER — Encounter: Payer: Self-pay | Admitting: Family Medicine

## 2017-04-15 VITALS — BP 152/90 | HR 98 | Ht 62.0 in | Wt 147.0 lb

## 2017-04-15 DIAGNOSIS — M542 Cervicalgia: Secondary | ICD-10-CM

## 2017-04-15 DIAGNOSIS — E559 Vitamin D deficiency, unspecified: Secondary | ICD-10-CM | POA: Diagnosis not present

## 2017-04-15 DIAGNOSIS — M4802 Spinal stenosis, cervical region: Secondary | ICD-10-CM | POA: Diagnosis not present

## 2017-04-15 DIAGNOSIS — M25512 Pain in left shoulder: Secondary | ICD-10-CM

## 2017-04-15 DIAGNOSIS — M25511 Pain in right shoulder: Secondary | ICD-10-CM | POA: Diagnosis not present

## 2017-04-15 LAB — URIC ACID: Uric Acid, Serum: 4.9 mg/dL (ref 2.4–7.0)

## 2017-04-15 LAB — IBC PANEL
Iron: 63 ug/dL (ref 42–145)
SATURATION RATIOS: 19.2 % — AB (ref 20.0–50.0)
TRANSFERRIN: 234 mg/dL (ref 212.0–360.0)

## 2017-04-15 LAB — SEDIMENTATION RATE: SED RATE: 45 mm/h — AB (ref 0–30)

## 2017-04-15 LAB — C-REACTIVE PROTEIN: CRP: 0.6 mg/dL (ref 0.5–20.0)

## 2017-04-15 MED ORDER — VITAMIN D (ERGOCALCIFEROL) 1.25 MG (50000 UNIT) PO CAPS: 50000.0000 [IU] | ORAL_CAPSULE | ORAL | 0 refills | Status: DC

## 2017-04-15 MED ORDER — GABAPENTIN 400 MG PO CAPS: 400.0000 mg | ORAL_CAPSULE | Freq: Every day | ORAL | 3 refills | Status: DC

## 2017-04-15 NOTE — Telephone Encounter (Signed)
-----   Message from Jacqualin Combes sent at 04/15/2017  3:57 PM EST ----- Patient states that you called her in regards to her recent labs. She is requesting a call back as she has some questions about her labs.   Thank you!  Mateo Flow

## 2017-04-15 NOTE — Telephone Encounter (Signed)
Called pt, Tiffany [name].   

## 2017-04-15 NOTE — Patient Instructions (Addendum)
Good to see you  Alvera Singh is your friend.  We will get labs today and xray of neck  Arnica lotion 2 times a day for the foot Once weekly vitamin D for 12 weeks Gabapentin 400mg  at night Exercises 3 times a week.  Over the counter  Turmeric 500mg  daily  Tart cherry extract any dose at night Duexis 3 times a day for 3 days but watch stomach  We will contact you on the labs See me again in 3-4 weeks

## 2017-04-15 NOTE — Assessment & Plan Note (Signed)
Changed once weekly vitamin D to help increase.  Change patient's gabapentin as well.

## 2017-04-15 NOTE — Assessment & Plan Note (Signed)
Patient does have bilateral shoulder pain.  Workup for more of a polymyalgia rheumatica or autoimmune disease will be started.  Patient is not on any medications that likely could contribute.  Patient was fairly adamant that she felt that she needed stronger pain medications and the oxycodone which we declined to give her.  Given a trial of topical anti-inflammatories and icing regimen.  Given home exercises to help her with some of the discomfort and pain as well.  Depending on findings we will change management where necessary.  Patient could be a candidate for physical therapy.  If we find cervical degenerative disc disease possible MRI for further evaluation of radiculopathy.

## 2017-04-16 ENCOUNTER — Telehealth: Payer: Self-pay

## 2017-04-16 ENCOUNTER — Other Ambulatory Visit: Payer: Self-pay

## 2017-04-16 DIAGNOSIS — M353 Polymyalgia rheumatica: Secondary | ICD-10-CM

## 2017-04-16 DIAGNOSIS — R768 Other specified abnormal immunological findings in serum: Secondary | ICD-10-CM

## 2017-04-16 NOTE — Telephone Encounter (Signed)
Spoke with patient in regards to results. Dr. Tamala Julian also explained results to patient. Referral will be send to rheumatology and patient told to expect their call.

## 2017-04-16 NOTE — Telephone Encounter (Signed)
-----   Message from Lyndal Pulley, DO sent at 04/16/2017  2:26 PM EST ----- Can we refer to rheumatology for +polymyalgia and positive ANA rule out lupus.  ----- Message ----- From: Interface, Lab In Three Zero One Sent: 04/15/2017   4:52 PM To: Lyndal Pulley, DO

## 2017-04-20 LAB — ANTI-NUCLEAR AB-TITER (ANA TITER): ANA Titer 1: 1:160 {titer} — ABNORMAL HIGH

## 2017-04-20 LAB — VITAMIN D 1,25 DIHYDROXY
VITAMIN D 1, 25 (OH) TOTAL: 67 pg/mL (ref 18–72)
VITAMIN D3 1, 25 (OH): 67 pg/mL

## 2017-04-20 LAB — PTH, INTACT AND CALCIUM
CALCIUM: 9.5 mg/dL (ref 8.6–10.4)
PTH: 43 pg/mL (ref 14–64)

## 2017-04-20 LAB — RHEUMATOID FACTOR

## 2017-04-20 LAB — CALCIUM, IONIZED: CALCIUM ION: 5.04 mg/dL (ref 4.8–5.6)

## 2017-04-20 LAB — ACETAMINOPHEN LEVEL: Acetaminophen (Tylenol), Serum: <10 mg/L — ABNORMAL LOW (ref 10–20)

## 2017-04-20 LAB — ANA: Anti Nuclear Antibody(ANA): POSITIVE — AB

## 2017-04-29 ENCOUNTER — Telehealth: Payer: Self-pay | Admitting: Internal Medicine

## 2017-04-29 DIAGNOSIS — E1165 Type 2 diabetes mellitus with hyperglycemia: Secondary | ICD-10-CM

## 2017-04-29 DIAGNOSIS — IMO0002 Reserved for concepts with insufficient information to code with codable children: Secondary | ICD-10-CM

## 2017-04-29 DIAGNOSIS — E119 Type 2 diabetes mellitus without complications: Secondary | ICD-10-CM | POA: Diagnosis not present

## 2017-04-29 MED ORDER — ACCU-CHEK NANO SMARTVIEW W/DEVICE KIT: PACK | 0 refills | Status: DC

## 2017-04-29 MED ORDER — ACCU-CHEK SOFTCLIX LANCETS MISC: 1 refills | Status: DC

## 2017-04-29 MED ORDER — MELOXICAM 15 MG PO TABS: 15.0000 mg | ORAL_TABLET | Freq: Every day | ORAL | 3 refills | Status: DC | PRN

## 2017-04-29 MED ORDER — GLUCOSE BLOOD VI STRP: 1.0000 | ORAL_STRIP | Freq: Three times a day (TID) | 1 refills | Status: DC

## 2017-04-29 NOTE — Telephone Encounter (Addendum)
Lab test results interpretation are to be conveyed to pt per Dr Tamala Julian, as I am not the ordering provider, and Mammogram results are always conveyed to the patient after the visit by letter and in person at time of service.  Results of the DXA were left on Mychart and were fine, no change in treatment needed  OK for Mobic 15 qd prn - I will do erx  Glucometer and supplies were already sent to pharmacy on Dec 4 - please see med list  I am unable to assist with driving her to her appointment to Dr Donnetta Hail, or knowing how this can otherwise be accomplished, as I will be out of town; thanks

## 2017-04-29 NOTE — Addendum Note (Signed)
Addended by: Biagio Borg on: 04/29/2017 09:37 PM   Modules accepted: Orders

## 2017-04-29 NOTE — Telephone Encounter (Signed)
Copied from [place] [id number]. Topic: Inquiry >> Apr 29, 2017 10:47 AM Neva Seat wrote: 1) Pt wants : Lab results / bone density test/ mammogram   2) A pain medication that will help with her pain in neck, back and shoulders.  She has tried ice - heat - gabapentin 400mg   and Codeine 3.25, Vitamin D,  Turmeric Curbumine complex.  All of this does not help pt with pain. 3) Needs new meter for testing blood sugar.  Her current one is broken  [place].  4) Dr. Donnetta Hail - Eye Dr. Abbott Pao needs to know how to get to the eye dr visit that was set up for her Dec. 6th.

## 2017-04-29 NOTE — Addendum Note (Signed)
Addended by: Earnstine Regal on: 04/29/2017 02:12 PM   Modules accepted: Orders

## 2017-04-29 NOTE — Telephone Encounter (Signed)
Sent BS monitor w/supplies pls advise on results & something for pain...Tiffany [name]

## 2017-04-29 NOTE — Telephone Encounter (Signed)
Left message to discuss her call from earlier. Will send her request for pain medication to provider.

## 2017-04-30 DIAGNOSIS — E118 Type 2 diabetes mellitus with unspecified complications: Secondary | ICD-10-CM | POA: Diagnosis not present

## 2017-04-30 NOTE — Telephone Encounter (Signed)
Called pt no answer LMOM w/MD response below...Tiffany [name]

## 2017-05-01 DIAGNOSIS — E119 Type 2 diabetes mellitus without complications: Secondary | ICD-10-CM | POA: Diagnosis not present

## 2017-05-01 DIAGNOSIS — H25813 Combined forms of age-related cataract, bilateral: Secondary | ICD-10-CM | POA: Diagnosis not present

## 2017-05-01 LAB — HM DIABETES EYE EXAM

## 2017-05-02 DIAGNOSIS — E118 Type 2 diabetes mellitus with unspecified complications: Secondary | ICD-10-CM | POA: Diagnosis not present

## 2017-05-07 NOTE — Telephone Encounter (Signed)
Pt has been informed and expressed understanding.  

## 2017-05-07 NOTE — Telephone Encounter (Signed)
PT called back asking for lab results from 11/1

## 2017-05-09 ENCOUNTER — Ambulatory Visit: Payer: Medicare Other | Admitting: Internal Medicine

## 2017-05-10 ENCOUNTER — Other Ambulatory Visit: Payer: Self-pay | Admitting: Family

## 2017-05-12 ENCOUNTER — Other Ambulatory Visit: Payer: Self-pay | Admitting: Internal Medicine

## 2017-05-12 MED ORDER — ZOLPIDEM TARTRATE 10 MG PO TABS: 10.0000 mg | ORAL_TABLET | Freq: Every evening | ORAL | 2 refills | Status: DC | PRN

## 2017-05-13 ENCOUNTER — Ambulatory Visit (INDEPENDENT_AMBULATORY_CARE_PROVIDER_SITE_OTHER): Payer: Medicare Other | Admitting: Podiatry

## 2017-05-13 ENCOUNTER — Encounter: Payer: Self-pay | Admitting: Podiatry

## 2017-05-13 VITALS — BP 152/96 | HR 90

## 2017-05-13 DIAGNOSIS — Q828 Other specified congenital malformations of skin: Secondary | ICD-10-CM | POA: Diagnosis not present

## 2017-05-13 DIAGNOSIS — E119 Type 2 diabetes mellitus without complications: Secondary | ICD-10-CM

## 2017-05-14 ENCOUNTER — Ambulatory Visit: Payer: Medicare Other | Admitting: Internal Medicine

## 2017-05-14 NOTE — Progress Notes (Signed)
   Subjective:    Patient ID: Tiffany [name], female    DOB: [birth year], 66 y.o.   MRN: [id number]  HPI this patient presents the office for an evaluation of her diabetic feet.  She was referred to this office from the [facility]  clinic.  She says that she had multiple surgeries on both feet years ago.  This patient is diabetic and is taking gabapentin.  She presents the office today for an evaluation of her diabetic feet and an evaluation of her painful callus both feet   Review of Systems  All other systems reviewed and are negative.      Objective:   Physical Exam General Appearance  Alert, conversant and in no acute stress.  Vascular  Dorsalis pedis and posterior pulses are palpable  bilaterally.  Capillary return is within normal limits  Bilaterally. Temperature is within normal limits  Bilaterally  Neurologic  Senn-Weinstein monofilament wire test within normal limits  bilaterally. Muscle power  Within normal limits bilaterally.  Nails Normal nails noted with no evidence of bacterial or fungal infections.  Orthopedic  No limitations of motion of motion feet bilaterally.  No crepitus or effusions noted.  No bony pathology or digital deformities noted.  Skin  normotropic skin with multiple porokeratotic lesions both feet. No signs of infections or ulcers noted.           Assessment & Plan:  Porokeratosis  B/L  Diabetes with neuropathy.  IE  Debridement of plantar porokeratosis  B/L  Examination of her foot reveals her feet are in good condition.  Her vascular muscle power WNL and LOPS  was found to be within normal limits.  Patient was told to return to the office in 3 months for continued treatment of her plantar porokeratosis   Gardiner Barefoot DPM

## 2017-05-15 ENCOUNTER — Encounter: Payer: Self-pay | Admitting: Family Medicine

## 2017-05-15 ENCOUNTER — Ambulatory Visit: Payer: Medicare Other | Admitting: Family Medicine

## 2017-05-15 ENCOUNTER — Ambulatory Visit: Payer: Self-pay

## 2017-05-15 VITALS — BP 150/84 | HR 83 | Ht 62.0 in | Wt 141.0 lb

## 2017-05-15 DIAGNOSIS — M5412 Radiculopathy, cervical region: Secondary | ICD-10-CM | POA: Diagnosis not present

## 2017-05-15 DIAGNOSIS — G8929 Other chronic pain: Secondary | ICD-10-CM

## 2017-05-15 DIAGNOSIS — M25511 Pain in right shoulder: Secondary | ICD-10-CM

## 2017-05-15 DIAGNOSIS — M25512 Pain in left shoulder: Principal | ICD-10-CM

## 2017-05-15 MED ORDER — TIZANIDINE HCL 4 MG PO TABS: 4.0000 mg | ORAL_TABLET | Freq: Three times a day (TID) | ORAL | 2 refills | Status: AC | PRN

## 2017-05-15 NOTE — Progress Notes (Signed)
[facility] [place], [place] [address] Phone: [phone] Subjective:    I'm seeing this patient by the request  of:    CC: Neck pain follow-up  [id number]  Tiffany [name] is a 66 y.o. female coming in with complaint of pain.  Patient states that her neck pain seems to be worsening.  Patient has had workup showing severe osteoarthritic changes of the neck as well as an elevated sedimentation rate.  Patient has increased the gabapentin to 400 mg and once weekly vitamin D.  Patient did have laboratory workup showing low iron and vitamin D.  Patient states with this as well as the different medications no significant improvement.  Patient states that if anything the pain seems to be worsening.  More pain going down the arms.  States that there is even pain at rest now.  Mild weakness of the hands.    Past Medical History:  Diagnosis Date  . Degenerative joint disease   . Diabetes mellitus   . Headache   . Hyperlipidemia   . Hypertension   . Sleep apnea    stopped using it   Past Surgical History:  Procedure Laterality Date  . ABDOMINAL HYSTERECTOMY  1976  . BACK SURGERY    . KNEE ARTHROSCOPY     left x 2  . LUMBAR LAMINECTOMY  1989   Social History   Socioeconomic History  . Marital status: Single    Spouse name: Not on file  . Number of children: Not on file  . Years of education: 8  . Highest education level: Not on file  Social Needs  . Financial resource strain: Not on file  . Food insecurity - worry: Not on file  . Food insecurity - inability: Not on file  . Transportation needs - medical: Not on file  . Transportation needs - non-medical: Not on file  Occupational History  . Occupation: Disability  Tobacco Use  . Smoking status: Former Smoker    Packs/day: 0.50    Years: 15.00    Pack years: 7.50    Last attempt to quit: 08/02/1990    Years since quitting: 26.8  . Smokeless tobacco: Never Used  Substance and  Sexual Activity  . Alcohol use: No    Alcohol/week: 0.0 oz  . Drug use: No  . Sexual activity: No  Other Topics Concern  . Not on file  Social History Narrative   Born in [place], [place].   Completed the 8th grade   Fun: Walk, crochet, puzzles   Diet - Variety of foods, does not eat breakfast   2 children - 44 and 45    6 grandchildren - 7 great grandchildren.    Allergies  Allergen Reactions  . Aspirin Nausea And Vomiting    Can take EC asa  . Contrast Media [Iodinated Diagnostic Agents] Nausea And Vomiting  . Metformin And Related Nausea And Vomiting    Severe nausea and vomiting  . Shellfish Allergy Nausea And Vomiting  . Tramadol Nausea And Vomiting and Other (See Comments)    Migraine  . Penicillins   . Statins Other (See Comments)    myalgias   Family History  Problem Relation Age of Onset  . Hypertension Mother   . Diabetes Mother   . Thyroid disease Mother   . Heart disease Mother   . Kidney disease Mother   . Diabetes Sister   . Hypertension Brother   . Stroke Brother   .  Heart disease Brother   . Colon cancer Neg Hx   . Colon polyps Neg Hx   . Rectal cancer Neg Hx   . Stomach cancer Neg Hx      Past medical history, social, surgical and family history all reviewed in electronic medical record.  No pertanent information unless stated regarding to the chief complaint.   Review of Systems:Review of systems updated and as accurate as of 05/15/17  No headache, visual changes, nausea, vomiting, diarrhea, constipation, dizziness, abdominal pain, skin rash, fevers, chills, night sweats, weight loss, swollen lymph nodes,  chest pain, shortness of breath, mood changes.  Positive body aches, joint swelling, muscle aches  Objective  There were no vitals taken for this visit. Systems examined below as of 05/15/17   General: No apparent distress alert and oriented x3 mood and affect normal, dressed appropriately.  HEENT: Pupils equal, extraocular movements intact    Respiratory: Patient's speak in full sentences and does not appear short of breath  Cardiovascular: No lower extremity edema, non tender, no erythema  Skin: Warm dry intact with no signs of infection or rash on extremities or on axial skeleton.  Abdomen: Soft nontender  Neuro: Cranial nerves II through XII are intact, neurovascularly intact in all extremities with  2+ pulses.  Lymph: No lymphadenopathy of posterior or anterior cervical chain or axillae bilaterally.  Gait normal with good balance and coordination.  MSK:  Non tender with full range of motion and good stability and symmetric strength and tone of shoulders, elbows, wrist, hip, knee and ankles bilaterally.  Mild arthritic changes in multiple joints Neck: Inspection loss of lordosis. No palpable stepoffs. Positive Spurling's maneuver. Significant decrease in range of motion lacking the last 5 degrees of flexion, last 10 degrees of extension, minimal side bending bilaterally. Weakness noted with 3+ out of 5 strength in the C8 distribution on the right side. Decreased deep tendon reflexes in the C7 on the right    Impression and Recommendations:     This case required medical decision making of moderate complexity.      Note: This dictation was prepared with Dragon dictation along with smaller phrase technology. Any transcriptional errors that result from this process are unintentional.

## 2017-05-15 NOTE — Patient Instructions (Signed)
Good to see yo u I am sorry you are hurting.  We will get MRI of the neck  Depending on this we may try injections.  I would make appointment with your nuerosurgeon as well to discuss.  Increase gabapentin to 800mg  at night Zanaflex up to 3 times a day  We will be talking after the MRI

## 2017-05-15 NOTE — Assessment & Plan Note (Signed)
Patient does have more of a cervical radiculopathy.  Seems to be in the C8 distribution or may be even C7 as well.  Severe osteoarthritic changes on x-ray.  MRI ordered today we discussed the possibility of potential steroid injections.  Patient is in agreement with that plan.  Has had back surgery and is also going to follow-up with her neurosurgeon to discuss the possibility of surgical intervention secondary to the pain.  Patient only has pain medications and is asking for something stronger which we declined to give.  We discussed worsening pain needs to go to the emergency room.  Patient verbalized understanding.

## 2017-06-13 ENCOUNTER — Other Ambulatory Visit: Payer: Self-pay | Admitting: Family Medicine

## 2017-06-16 NOTE — Telephone Encounter (Signed)
Refill done.  

## 2017-08-15 ENCOUNTER — Telehealth: Payer: Self-pay | Admitting: Internal Medicine

## 2017-08-15 NOTE — Telephone Encounter (Signed)
Copied from [place] [id number]. Topic: Quick Communication - See Telephone Encounter >> Aug 15, 2017 11:58 AM Conception Chancy, NT wrote: CRM for notification. See Telephone encounter for: 08/15/17.  Patient is calling and states she needs a refill on gabapentin (NEURONTIN) 400 MG capsule  she states she is out because Dr. Tamala Julian verbally told her to take 2 at bedtime and the bottle states 1. She states the pharmacy is requesting a whole new script.  [id number] [address], [place] [address]  Phone: [phone] Fax: [phone]

## 2017-08-16 MED ORDER — GABAPENTIN 400 MG PO CAPS: 400.0000 mg | ORAL_CAPSULE | Freq: Every day | ORAL | 1 refills | Status: DC

## 2017-08-19 ENCOUNTER — Other Ambulatory Visit (INDEPENDENT_AMBULATORY_CARE_PROVIDER_SITE_OTHER): Payer: Medicare Other

## 2017-08-19 ENCOUNTER — Ambulatory Visit: Payer: Medicare Other | Admitting: Internal Medicine

## 2017-08-19 ENCOUNTER — Ambulatory Visit (INDEPENDENT_AMBULATORY_CARE_PROVIDER_SITE_OTHER)
Admission: RE | Admit: 2017-08-19 | Discharge: 2017-08-19 | Disposition: A | Discharge status: Home / Self Care | Payer: Medicare Other | Source: Ambulatory Visit | Attending: Internal Medicine | Admitting: Internal Medicine

## 2017-08-19 ENCOUNTER — Other Ambulatory Visit: Payer: Self-pay | Admitting: Internal Medicine

## 2017-08-19 VITALS — BP 142/82 | HR 96 | Temp 98.6°F | Ht 62.0 in | Wt 150.0 lb

## 2017-08-19 DIAGNOSIS — IMO0001 Reserved for inherently not codable concepts without codable children: Secondary | ICD-10-CM

## 2017-08-19 DIAGNOSIS — M25512 Pain in left shoulder: Secondary | ICD-10-CM

## 2017-08-19 DIAGNOSIS — M5412 Radiculopathy, cervical region: Secondary | ICD-10-CM

## 2017-08-19 DIAGNOSIS — R222 Localized swelling, mass and lump, trunk: Secondary | ICD-10-CM | POA: Diagnosis not present

## 2017-08-19 DIAGNOSIS — E1165 Type 2 diabetes mellitus with hyperglycemia: Secondary | ICD-10-CM

## 2017-08-19 DIAGNOSIS — R079 Chest pain, unspecified: Secondary | ICD-10-CM

## 2017-08-19 DIAGNOSIS — M25511 Pain in right shoulder: Secondary | ICD-10-CM | POA: Diagnosis not present

## 2017-08-19 DIAGNOSIS — R779 Abnormality of plasma protein, unspecified: Secondary | ICD-10-CM

## 2017-08-19 LAB — CBC WITH DIFFERENTIAL/PLATELET
BASOS PCT: 0.5 % (ref 0.0–3.0)
Basophils Absolute: 0 10*3/uL (ref 0.0–0.1)
EOS PCT: 0.5 % (ref 0.0–5.0)
Eosinophils Absolute: 0 10*3/uL (ref 0.0–0.7)
HCT: 42.6 % (ref 36.0–46.0)
Hemoglobin: 14.3 g/dL (ref 12.0–15.0)
LYMPHS ABS: 1.1 10*3/uL (ref 0.7–4.0)
Lymphocytes Relative: 22.1 % (ref 12.0–46.0)
MCHC: 33.4 g/dL (ref 30.0–36.0)
MCV: 87.6 fl (ref 78.0–100.0)
MONO ABS: 0.3 10*3/uL (ref 0.1–1.0)
Monocytes Relative: 5.4 % (ref 3.0–12.0)
NEUTROS PCT: 71.5 % (ref 43.0–77.0)
Neutro Abs: 3.4 10*3/uL (ref 1.4–7.7)
PLATELETS: 262 10*3/uL (ref 150.0–400.0)
RBC: 4.87 Mil/uL (ref 3.87–5.11)
RDW: 14.2 % (ref 11.5–15.5)
WBC: 4.8 10*3/uL (ref 4.0–10.5)

## 2017-08-19 LAB — BASIC METABOLIC PANEL
BUN: 13 mg/dL (ref 6–23)
CHLORIDE: 100 meq/L (ref 96–112)
CO2: 26 meq/L (ref 19–32)
Calcium: 9.7 mg/dL (ref 8.4–10.5)
Creatinine, Ser: 0.63 mg/dL (ref 0.40–1.20)
GFR: 121.55 mL/min (ref >60.00)
GLUCOSE: 239 mg/dL — AB (ref 70–99)
POTASSIUM: 3.9 meq/L (ref 3.5–5.1)
SODIUM: 134 meq/L — AB (ref 135–145)

## 2017-08-19 LAB — HEPATIC FUNCTION PANEL
ALT: 36 U/L — ABNORMAL HIGH (ref 0–35)
AST: 22 U/L (ref 0–37)
Albumin: 4.3 g/dL (ref 3.5–5.2)
Alkaline Phosphatase: 89 U/L (ref 39–117)
BILIRUBIN DIRECT: 0.1 mg/dL (ref 0.0–0.3)
BILIRUBIN TOTAL: 0.4 mg/dL (ref 0.2–1.2)
Total Protein: 9.3 g/dL — ABNORMAL HIGH (ref 6.0–8.3)

## 2017-08-19 LAB — LIPID PANEL
CHOL/HDL RATIO: 4
Cholesterol: 203 mg/dL — ABNORMAL HIGH (ref 0–200)
HDL: 56.5 mg/dL (ref >39.00)
LDL CALC: 131 mg/dL — AB (ref 0–99)
NONHDL: 146.35
Triglycerides: 79 mg/dL (ref 0.0–149.0)
VLDL: 15.8 mg/dL (ref 0.0–40.0)

## 2017-08-19 LAB — SEDIMENTATION RATE: Sed Rate: 47 mm/hr — ABNORMAL HIGH (ref 0–30)

## 2017-08-19 LAB — HEMOGLOBIN A1C: HEMOGLOBIN A1C: 8.4 % — AB (ref 4.6–6.5)

## 2017-08-19 MED ORDER — ROSUVASTATIN CALCIUM 20 MG PO TABS: 20.0000 mg | ORAL_TABLET | Freq: Every day | ORAL | 3 refills | Status: DC

## 2017-08-19 MED ORDER — INSULIN GLARGINE 100 UNIT/ML SOLOSTAR PEN: 60.0000 [IU] | PEN_INJECTOR | Freq: Every day | SUBCUTANEOUS | 3 refills | Status: DC

## 2017-08-19 MED ORDER — GABAPENTIN 400 MG PO CAPS: 400.0000 mg | ORAL_CAPSULE | Freq: Three times a day (TID) | ORAL | 1 refills | Status: DC

## 2017-08-19 MED ORDER — GABAPENTIN 400 MG PO CAPS: 400.0000 mg | ORAL_CAPSULE | Freq: Two times a day (BID) | ORAL | 1 refills | Status: DC

## 2017-08-19 NOTE — Assessment & Plan Note (Signed)
Ok for increased gabapentin 400 tid, pt seems to tolerate without sleepiness

## 2017-08-19 NOTE — Assessment & Plan Note (Signed)
With rather dramatic swelling extending to left supraclavicular area as well, somewhat involves the right sternoclavicular mild as well, also with some fullness of the neck with ? LA; will do simple cxr today and f/u sports medicine as planned, but will need to consider CT chest and neck if no definitive etiology; I offered short course of prednisone trial but she declines due to risk of increased blood sugars

## 2017-08-19 NOTE — Progress Notes (Signed)
Subjective:    Patient ID: Tiffany [name], female    DOB: [birth year], 67 y.o.   MRN: [id number]  HPI    Here to f/u with c/o persistent pain to neck c/w prior dx of cervical radiculopathy, has had some success with increased gabapentin on her own to 40 bid but still not well controlled, Pt continues to have recurring neck pain without bowel or bladder change, fever, wt loss,  worsening LE pain/numbness/weakness, gait change or falls.  Tumeric seemed to make sugars lower, so not taking.  Has appt to f/u Dr Tamala Julian mar 28.  Also with c/o more recent onset left shoulder pain with swelling and reduced ROM to forward elevation.  No hx of same.  Also with pain and swelling to the upper chest upper sternal area ? Most prominent at the left sternoclavicular but also mild to the right.  No injury, hx of gout, fever, ST, cough or trauma.  Pt denies other chest pain, increased sob or doe, wheezing, orthopnea, PND, increased LE swelling, palpitations, dizziness or syncope.  Has multiple labs done per sports medicine last visit, ANA + with mild to mod elevated esr, but has not seen rheumatology as she is unable to afford or arrange transportation "[place] the [address] [place] [address] Past Medical History:  Diagnosis Date  . Degenerative joint disease   . Diabetes mellitus   . Headache   . Hyperlipidemia   . Hypertension   . Sleep apnea    stopped using it   Past Surgical History:  Procedure Laterality Date  . ABDOMINAL HYSTERECTOMY  1976  . BACK SURGERY    . KNEE ARTHROSCOPY     left x 2  . [place]    reports that she quit smoking about 27 years ago. She has a 7.50 pack-year smoking history. She has never used smokeless tobacco. She reports that she does not drink alcohol or use drugs. family history includes Diabetes in her mother and sister; Heart disease in her brother and mother; Hypertension in her brother and mother; Kidney disease in her mother; Stroke in her brother; Thyroid  disease in her mother. Allergies  Allergen Reactions  . Aspirin Nausea And Vomiting    Can take EC asa  . Contrast Media [Iodinated Diagnostic Agents] Nausea And Vomiting  . Metformin And Related Nausea And Vomiting    Severe nausea and vomiting  . Shellfish Allergy Nausea And Vomiting  . Tramadol Nausea And Vomiting and Other (See Comments)    Migraine  . Penicillins   . Statins Other (See Comments)    myalgias   Current Outpatient Medications on File Prior to Visit  Medication Sig Dispense Refill  . ACCU-CHEK SOFTCLIX LANCETS lancets Use to check blood sugars three times a day Dx code: E11.9 300 each 1  . amLODipine (NORVASC) 5 MG tablet TAKE 1 TABLET (5 MG TOTAL) BY MOUTH DAILY. 90 tablet 3  . Blood Glucose Monitoring Suppl (ACCU-CHEK NANO SMARTVIEW) w/Device KIT Use to check blood sugar as directed up to 3 times a day. 1 kit 0  . glimepiride (AMARYL) 4 MG tablet Take 1 tablet (4 mg total) by mouth daily with breakfast. Needs office visit for more refills. 90 tablet 3  . glucose blood (ACCU-CHEK AVIVA PLUS) test strip 1 each by Other route 3 (three) times daily. Use to check blood sugars three times a day Dx E11.8 300 each 1  . Insulin Pen Needle 32G X 4 MM MISC  Use to inject  lantus once daily 100 each 4  . meloxicam (MOBIC) 15 MG tablet Take 1 tablet (15 mg total) by mouth daily as needed for pain. 30 tablet 3  . oxyCODONE-acetaminophen (PERCOCET) 7.5-325 MG tablet Take 1 tablet by mouth every 4 (four) hours as needed for severe pain.    Marland Kitchen quinapril (ACCUPRIL) 40 MG tablet Take 1 tablet (40 mg total) by mouth daily. 90 tablet 3  . ranitidine (ZANTAC) 150 MG tablet Take 150 mg by mouth 2 (two) times daily as needed for heartburn.    Marland Kitchen tiZANidine (ZANAFLEX) 4 MG tablet   1  . Vitamin D, Ergocalciferol, (DRISDOL) 50000 units CAPS capsule Take 1 capsule (50,000 Units total) by mouth every 7 (seven) days. 12 capsule 0  . zolpidem (AMBIEN) 10 MG tablet Take 1 tablet (10 mg total) by mouth  at bedtime as needed. 30 tablet 2   No current facility-administered medications on file prior to visit.    Review of Systems  Constitutional: Negative for other unusual diaphoresis or sweats HENT: Negative for ear discharge or swelling Eyes: Negative for other worsening visual disturbances Respiratory: Negative for stridor or other swelling  Gastrointestinal: Negative for worsening distension or other blood Genitourinary: Negative for retention or other urinary change Musculoskeletal: Negative for other MSK pain or swelling Skin: Negative for color change or other new lesions Neurological: Negative for worsening tremors and other numbness  Psychiatric/Behavioral: Negative for worsening agitation or other fatigue All other system neg per pt    Objective:   Physical Exam BP (!) 142/82 (BP Location: Left Arm, Patient Position: Sitting, Cuff Size: Normal)   Pulse 96   Temp 98.6 F (37 C) (Oral)   Ht 5' 2"  (1.575 m)   Wt 150 lb (68 kg)   SpO2 100%   BMI 27.44 kg/m  VS noted, non toxic but uncomfortable Constitutional: Pt appears in NAD HENT: Head: NCAT.  Right Ear: External ear normal.  Left Ear: External ear normal.  Eyes: . Pupils are equal, round, and reactive to light. Conjunctivae and EOM are normal Nose: without d/c or deformity Neck: Neck supple. Gross normal ROM Cardiovascular: Normal rate and regular rhythm.   Pulmonary/Chest: Effort normal and breath sounds without rales or wheezing.  but with a sort of diffuse swelling submandibular bilat with ? shoddy LA, not hard or fixed, maybe somewhat more prominent on the right Left sternoclavicular area 1-2+ tender swelling and trace to 1+ on the right sternoclavicular joint with diffuse non discrete swelling to the left supraclavicular area as well without mass Left shoulder with 1-2+ swelling tender to bicipital insertion site area Marked swelling and tender to left  Neurological: Pt is alert. At baseline orientation, motor  grossly intact Skin: Skin is warm. No rashes, other new lesions, no LE edema Psychiatric: Pt behavior is normal without agitation  No other exam findings    Assessment & Plan:

## 2017-08-19 NOTE — Assessment & Plan Note (Signed)
Etiology unclear, except for possible left bicipital tendonitis by exam, for f/u sports medicine as planned

## 2017-08-19 NOTE — Patient Instructions (Addendum)
OK to increase the gabapentin to 400 three times per day  Please continue all other medications as before, and refills have been done if requested.  Please have the pharmacy call with any other refills you may need.  Please continue your efforts at being more active, low cholesterol diet, and weight control.  You are otherwise up to date with prevention measures today.  Please keep your appointments with your specialists as you may have planned - Dr Tamala Julian on Thursday;  He may do an ultrasound to the upper chest and neck as well as left shoulder  Please go to the XRAY Department in the Basement (go straight as you get off the elevator) for the x-ray testing  Please go to the LAB in the Basement (turn left off the elevator) for the tests to be done today  You will be contacted by phone if any changes need to be made immediately.  Otherwise, you will receive a letter about your results with an explanation, but please check with MyChart first.  Please remember to sign up for MyChart if you have not done so, as this will be important to you in the future with finding out test results, communicating by private email, and scheduling acute appointments online when needed.

## 2017-08-19 NOTE — Assessment & Plan Note (Signed)
stable overall by history and exam, recent data reviewed with pt, and pt to continue medical treatment as before,  to f/u any worsening symptoms or concerns, for f/u a1c today 

## 2017-08-20 ENCOUNTER — Telehealth: Payer: Self-pay | Admitting: Internal Medicine

## 2017-08-20 DIAGNOSIS — R778 Other specified abnormalities of plasma proteins: Secondary | ICD-10-CM

## 2017-08-20 NOTE — Progress Notes (Signed)
[facility] [address] [place], [place] [address] Phone: [phone] Subjective:    I'm seeing this patient by the request  of:    CC: Neck pain and bilateral shoulder pain follow-up  [id number]  Tiffany [name] is a 67 y.o. female coming in with complaint of neck pain.  Does have severe osteoarthritic changes of the neck.  Elevated sedimentation rate noted on laboratory workup previously.  Started on gabapentin 4 months ago as well as once weekly vitamin D.  In addition to the elevated sedimentation rate labs and showed low iron and vitamin D.  Patient saw primary care provider recently.  Did have a shoulder x-ray done.  Independently visualized by me showing mild osteopenia.  Chest x-ray at that time also unremarkable.  Patient states that she is getting worse. She has not been to rheumatology yet due to transportation issues. She is having a hard time lifting her arms over head and feels that her range of motion has decreased. She is having a hard time sleeping due to the pain when she extends her head backwards towards the pillow.  She feels that she is worsening over the course of time.  Patient states that she is never without pain at this time.       Past Medical History:  Diagnosis Date  . Degenerative joint disease   . Diabetes mellitus   . Headache   . Hyperlipidemia   . Hypertension   . Sleep apnea    stopped using it   Past Surgical History:  Procedure Laterality Date  . ABDOMINAL HYSTERECTOMY  1976  . BACK SURGERY    . KNEE ARTHROSCOPY     left x 2  . LUMBAR LAMINECTOMY  1989   Social History   Socioeconomic History  . Marital status: Single    Spouse name: Not on file  . Number of children: Not on file  . Years of education: 8  . Highest education level: Not on file  Occupational History  . Occupation: Disability  Social Needs  . Financial resource strain: Not on file  . Food insecurity:    Worry: Not on file   Inability: Not on file  . Transportation needs:    Medical: Not on file    Non-medical: Not on file  Tobacco Use  . Smoking status: Former Smoker    Packs/day: 0.50    Years: 15.00    Pack years: 7.50    Last attempt to quit: 08/02/1990    Years since quitting: 27.0  . Smokeless tobacco: Never Used  Substance and Sexual Activity  . Alcohol use: No    Alcohol/week: 0.0 oz  . Drug use: No  . Sexual activity: Never  Lifestyle  . Physical activity:    Days per week: Not on file    Minutes per session: Not on file  . Stress: Not on file  Relationships  . Social connections:    Talks on phone: Not on file    Gets together: Not on file    Attends religious service: Not on file    Active member of club or organization: Not on file    Attends meetings of clubs or organizations: Not on file    Relationship status: Not on file  Other Topics Concern  . Not on file  Social History Narrative   Born in [place], [place].   Completed the 8th grade   Fun: Walk, crochet, puzzles   Diet - Variety  of foods, does not eat breakfast   2 children - 44 and 45    6 grandchildren - 7 great grandchildren.    Allergies  Allergen Reactions  . Aspirin Nausea And Vomiting    Can take EC asa  . Contrast Media [Iodinated Diagnostic Agents] Nausea And Vomiting  . Metformin And Related Nausea And Vomiting    Severe nausea and vomiting  . Shellfish Allergy Nausea And Vomiting  . Tramadol Nausea And Vomiting and Other (See Comments)    Migraine  . Penicillins   . Statins Other (See Comments)    myalgias   Family History  Problem Relation Age of Onset  . Hypertension Mother   . Diabetes Mother   . Thyroid disease Mother   . Heart disease Mother   . Kidney disease Mother   . Diabetes Sister   . Hypertension Brother   . Stroke Brother   . Heart disease Brother   . Colon cancer Neg Hx   . Colon polyps Neg Hx   . Rectal cancer Neg Hx   . Stomach cancer Neg Hx      Past medical history,  social, surgical and family history all reviewed in electronic medical record.  No pertanent information unless stated regarding to the chief complaint.   Review of Systems:Review of systems updated and as accurate as of 08/20/17  No  visual changes, nausea, vomiting, diarrhea, constipation, dizziness, abdominal pain, skin rash, fevers, chills, night sweats, weight losschest pain, shortness of breath, mood changes.  Positive muscle aches, headaches, neck pain, body aches, joint swelling, swollen lymph nodes  Objective  There were no vitals taken for this visit. Systems examined below as of 08/20/17   General: No apparent distress alert and oriented x3 mood and affect normal, dressed appropriately.  HEENT: Pupils equal, extraocular movements intact  Respiratory: Patient's speak in full sentences and does not appear short of breath  Cardiovascular: No lower extremity edema, non tender, no erythema  Skin: Warm dry intact with no signs of infection or rash on extremities or on axial skeleton.  Abdomen: Soft nontender  Neuro: Cranial nerves II through XII are intact, neurovascularly intact in all extremities with 2+ DTRs and 2+ pulses.  Lymph: No lymphadenopathy of posterior or anterior cervical chain or axillae bilaterally.  Gait normal with good balance and coordination.  MSK:  Non tender with full range of motion and good stability and symmetric strength and tone of shoulders, elbows, wrist, hip, knee and ankles bilaterally.  Arthritic changes from multiple joints.  Patient's [place] joint on the left side is significantly enlarged.  Seem to be somewhat fluctuant.  Mild warm to palpation.  Severely tender to palpation.  Seems to be crossing midline and going towards the right side as well.  Does move up approximately 1 cm proximally near the neck.  No true mass palpated.  Shoulders to have 4 out of 5 strength bilaterally.  Neck exam shows only 5 degrees of extension with radiation down both arms.   Minimal side bending and rotation bilaterally as well.  Crepitus noted.  Limited musculoskeletal ultrasound was performed and interpreted by Lyndal Pulley  Limited ultrasound of the left sternoclavicular joint shows the patient does have an effusion but also erosive changes of the bone noted.  Abnormal vascularization in the area but no true mass appreciated.  Patient does have some surrounding enlargement of the lymph nodes. Impression: Capsulitis and joint effusion of the sternoclavicular joints but erosive changes of the bone  noted as well.    Impression and Recommendations:     This case required medical decision making of moderate complexity.      Note: This dictation was prepared with Dragon dictation along with smaller phrase technology. Any transcriptional errors that result from this process are unintentional.

## 2017-08-20 NOTE — Telephone Encounter (Signed)
-----   Message from Ander Slade, RN sent at 08/20/2017  8:26 AM EDT ----- Left message advising patient of dr Judi Cong note/instructions----routing back to dr Alleen Borne enter/order SPEP lab you want patient to get----I have advised patient to return to lab for extra lab work---this is a lab that has to be tested outside our lab---so they could not add-on, thanks

## 2017-08-21 ENCOUNTER — Encounter: Payer: Self-pay | Admitting: Family Medicine

## 2017-08-21 ENCOUNTER — Ambulatory Visit (INDEPENDENT_AMBULATORY_CARE_PROVIDER_SITE_OTHER): Payer: Medicare Other | Admitting: Family Medicine

## 2017-08-21 ENCOUNTER — Ambulatory Visit: Payer: Self-pay

## 2017-08-21 ENCOUNTER — Other Ambulatory Visit: Payer: Medicare Other

## 2017-08-21 VITALS — BP 128/78 | HR 77 | Ht 62.0 in | Wt 150.0 lb

## 2017-08-21 DIAGNOSIS — R778 Other specified abnormalities of plasma proteins: Secondary | ICD-10-CM | POA: Diagnosis not present

## 2017-08-21 DIAGNOSIS — M89319 Hypertrophy of bone, unspecified shoulder: Secondary | ICD-10-CM

## 2017-08-21 DIAGNOSIS — M25512 Pain in left shoulder: Secondary | ICD-10-CM | POA: Diagnosis not present

## 2017-08-21 DIAGNOSIS — M542 Cervicalgia: Secondary | ICD-10-CM | POA: Diagnosis not present

## 2017-08-21 DIAGNOSIS — R222 Localized swelling, mass and lump, trunk: Secondary | ICD-10-CM | POA: Diagnosis not present

## 2017-08-21 MED ORDER — GABAPENTIN 100 MG PO CAPS: 100.0000 mg | ORAL_CAPSULE | Freq: Two times a day (BID) | ORAL | 3 refills | Status: DC

## 2017-08-21 NOTE — Patient Instructions (Signed)
Good to  See you  We will get some CT scans.  Ice is your friend.  Lets add gabapentin 100 mg in AM, 100mg  in PM and continue the 400mg  at night pennsaid pinkie amount topically 2 times daily as needed.   Lets see what the scans show and we will go from there.

## 2017-08-21 NOTE — Assessment & Plan Note (Signed)
Severe on the left side and seems to be enlarging.  Did not appeared to be an infectious etiology but does have what appears to be a large capsulitis with erosive changes of the bone on ultrasound.  X-rays did not show any significant bony abnormalities that were independently visualized by me.  I do believe that a CT scan with contrast of the chest as well as neck is necessary at this time.  Patient denies any shortness of breath or any trouble breathing but we discussed to seek medical attention immediately.  Differential is quite broad including infection, enlargement of the lymph node, metastatic disease, uric acid deposits, or inflammatory arthropathy.  Discussed with patient depending on findings we will discuss further medical management.  Increase gabapentin with 100 mg during the day continue to 400 at night.  Patient already has chronic pain medications from another provider.  Depending on findings we will discuss further treatment options.

## 2017-08-23 DIAGNOSIS — E118 Type 2 diabetes mellitus with unspecified complications: Secondary | ICD-10-CM | POA: Diagnosis not present

## 2017-08-23 DIAGNOSIS — E119 Type 2 diabetes mellitus without complications: Secondary | ICD-10-CM | POA: Diagnosis not present

## 2017-08-25 LAB — PROTEIN ELECTROPHORESIS, SERUM
Albumin ELP: 4.1 g/dL (ref 3.8–4.8)
Alpha 1: 0.3 g/dL (ref 0.2–0.3)
Alpha 2: 1 g/dL — ABNORMAL HIGH (ref 0.5–0.9)
Beta 2: 0.5 g/dL (ref 0.2–0.5)
Beta Globulin: 0.4 g/dL (ref 0.4–0.6)
Gamma Globulin: 1.8 g/dL — ABNORMAL HIGH (ref 0.8–1.7)
Total Protein: 8.2 g/dL — ABNORMAL HIGH (ref 6.1–8.1)

## 2017-09-03 ENCOUNTER — Other Ambulatory Visit: Payer: Self-pay | Admitting: Family Medicine

## 2017-09-03 ENCOUNTER — Ambulatory Visit
Admission: RE | Admit: 2017-09-03 | Discharge: 2017-09-03 | Disposition: A | Discharge status: Home / Self Care | Payer: Medicare Other | Source: Ambulatory Visit | Attending: Family Medicine | Admitting: Family Medicine

## 2017-09-03 DIAGNOSIS — S199XXA Unspecified injury of neck, initial encounter: Secondary | ICD-10-CM | POA: Diagnosis not present

## 2017-09-03 DIAGNOSIS — M89319 Hypertrophy of bone, unspecified shoulder: Secondary | ICD-10-CM

## 2017-09-03 DIAGNOSIS — S299XXA Unspecified injury of thorax, initial encounter: Secondary | ICD-10-CM | POA: Diagnosis not present

## 2017-09-03 DIAGNOSIS — M542 Cervicalgia: Secondary | ICD-10-CM

## 2017-09-03 DIAGNOSIS — R222 Localized swelling, mass and lump, trunk: Secondary | ICD-10-CM

## 2017-09-05 ENCOUNTER — Telehealth: Payer: Self-pay

## 2017-09-05 NOTE — Telephone Encounter (Signed)
The SPEP "protein" test was negative, and no further evaluation or tx is needed

## 2017-09-05 NOTE — Telephone Encounter (Signed)
Pt has been informed and expressed understanding.  

## 2017-09-05 NOTE — Telephone Encounter (Signed)
Dr. Jenny Reichmann please advise on lab interpretation. I do not see an notations on the lab order. Thanks!  Copied from [place] [id number]. Topic: Quick Communication - Lab Results >> Sep 04, 2017  2:47 PM Hewitt Shorts wrote: Pt is looking for lab work from the 28th   Best number [phone]

## 2017-09-16 ENCOUNTER — Telehealth: Payer: Self-pay

## 2017-09-16 NOTE — Telephone Encounter (Signed)
Pt calling back for results of this test.  Pt does not remember getting this info. Pt thought she only spoke about her A1C . Pt would like a copy of her lab results mailed to her home. Pt does not do mychart at all.

## 2017-09-16 NOTE — Telephone Encounter (Signed)
Copied from [place] [id number]. Topic: Quick Communication - Other Results >> Sep 16, 2017  2:06 PM Scherrie Gerlach wrote: Pt would like results of her CT scan done 4/10

## 2017-09-16 NOTE — Telephone Encounter (Signed)
Called patient to relay CT results. Patient states that she still has pain with shoulder flexion and movement of her arm does increase the swelling over the chest area. Offered patient an appointment for further evaluation but she declines at this time and states that she will call us back to get an appointment.

## 2017-09-23 ENCOUNTER — Ambulatory Visit: Payer: Self-pay

## 2017-09-23 NOTE — Telephone Encounter (Signed)
Patient called in with c/o "leg swelling." She says "I noticed it about 2 weeks ago, swelling from my knees up my thighs, not my lower legs. I also have pain in my knees. The pain comes and goes and it is a 6-7 when it comes. I don't have redness to my legs." I asked about her abdomen, she says "sometimes I feel like it's swollen, especially when I drink water." I asked about other symptoms-chest pain, difficulty breathing. She denies. According to protocol, see PCP within 24 hours, appointment scheduled for tomorrow at 1520, care advice given, patient verbalized understanding.  Reason for Disposition . [1] MODERATE leg swelling (e.g., swelling extends up to knees) AND [2] new onset or worsening  Answer Assessment - Initial Assessment Questions 1. ONSET: "When did the swelling start?" (e.g., minutes, hours, days)     2 weeks ago 2. LOCATION: "What part of the leg is swollen?"  "Are both legs swollen or just one leg?"     Both legs from knees up to thighs 3. SEVERITY: "How bad is the swelling?" (e.g., localized; mild, moderate, severe)  - Localized - small area of swelling localized to one leg  - MILD pedal edema - swelling limited to foot and ankle, pitting edema < 1/4 inch (6 mm) deep, rest and elevation eliminate most or all swelling  - MODERATE edema - swelling of lower leg to knee, pitting edema > 1/4 inch (6 mm) deep, rest and elevation only partially reduce swelling  - SEVERE edema - swelling extends above knee, facial or hand swelling present     Severe 4. REDNESS: "Does the swelling look red or infected?"     No 5. PAIN: "Is the swelling painful to touch?" If so, ask: "How painful is it?"   (Scale 1-10; mild, moderate or severe)     Rt knee, comes and goes 6-7 6. FEVER: "Do you have a fever?" If so, ask: "What is it, how was it measured, and when did it start?"      No 7. CAUSE: "What do you think is causing the leg swelling?"     No idea 8. MEDICAL HISTORY: "Do you have a history of  heart failure, kidney disease, liver failure, or cancer?"     No 9. RECURRENT SYMPTOM: "Have you had leg swelling before?" If so, ask: "When was the last time?" "What happened that time?"     Yes, years ago 10. OTHER SYMPTOMS: "Do you have any other symptoms?" (e.g., chest pain, difficulty breathing)       No 11. PREGNANCY: "Is there any chance you are pregnant?" "When was your last menstrual period?"       No  Protocols used: LEG SWELLING AND EDEMA-A-AH

## 2017-09-24 ENCOUNTER — Ambulatory Visit: Payer: Medicare Other | Admitting: Internal Medicine

## 2017-09-24 ENCOUNTER — Encounter: Payer: Self-pay | Admitting: Internal Medicine

## 2017-09-24 VITALS — BP 126/84 | HR 95 | Temp 98.6°F | Ht 62.0 in | Wt 153.0 lb

## 2017-09-24 DIAGNOSIS — E782 Mixed hyperlipidemia: Secondary | ICD-10-CM | POA: Diagnosis not present

## 2017-09-24 DIAGNOSIS — I10 Essential (primary) hypertension: Secondary | ICD-10-CM | POA: Diagnosis not present

## 2017-09-24 DIAGNOSIS — IMO0001 Reserved for inherently not codable concepts without codable children: Secondary | ICD-10-CM

## 2017-09-24 DIAGNOSIS — E1165 Type 2 diabetes mellitus with hyperglycemia: Secondary | ICD-10-CM

## 2017-09-24 MED ORDER — INSULIN GLARGINE 100 UNIT/ML SOLOSTAR PEN: 55.0000 [IU] | PEN_INJECTOR | Freq: Every day | SUBCUTANEOUS | 3 refills | Status: DC

## 2017-09-24 MED ORDER — EZETIMIBE 10 MG PO TABS
10.0000 mg | ORAL_TABLET | Freq: Every day | ORAL | 3 refills | Status: DC
Start: 2017-09-24 — End: 2018-11-30

## 2017-09-24 NOTE — Assessment & Plan Note (Signed)
stable overall by history and exam, recent data reviewed with pt, and pt to continue medical treatment as before,  to f/u any worsening symptoms or concerns BP Readings from Last 3 Encounters:  09/24/17 126/84  08/21/17 128/78  08/19/17 (!) 142/82

## 2017-09-24 NOTE — Assessment & Plan Note (Signed)
States statin intolerant, to start zetia 10 qd

## 2017-09-24 NOTE — Patient Instructions (Addendum)
OK to decrease the Lantus to 55 units per day  Ok to stop the crestor as you have  Please take all new medication as prescribed - the Zetia for high cholesterol  Please continue all other medications as before, and refills have been done if requested.  Please have the pharmacy call with any other refills you may need.  Please continue your efforts at being more active, low cholesterol diabetic diet, and weight control.  Please keep your appointments with your specialists as you may have planned  No further lab work needed today

## 2017-09-24 NOTE — Assessment & Plan Note (Signed)
Mild overcontrolled by hx, to reduce lantus to 55 u qd

## 2017-09-24 NOTE — Progress Notes (Signed)
Subjective:    Patient ID: Tiffany [name], female    DOB: [birth year], 67 y.o.   MRN: [id number]  HPI  Here to f/u; overall doing ok,  Pt denies chest pain, increasing sob or doe, wheezing, orthopnea, PND, increased LE swelling, palpitations, dizziness or syncope.  Pt denies new neurological symptoms such as new headache, or facial or extremity weakness or numbness.  Pt denies polydipsia, polyuria, but has had several low sugar episode on 60 units lantus in the AM in the past wk.  Pt states overall good compliance with meds, mostly trying to follow appropriate diet, with wt overall stable,  but little exercise however. Statin intolerant. Also believes the has LE swelling but points to fatty deposit area to medial pre and distal left knee, as well as distal medial right upper leg above the knee as well.  Still c/o enlarged clavicle and indeed is quite large and inhibiting with abduction of the arms, just really bothers her.  No other new complaints Past Medical History:  Diagnosis Date  . Degenerative joint disease   . Diabetes mellitus   . Headache   . Hyperlipidemia   . Hypertension   . Sleep apnea    stopped using it   Past Surgical History:  Procedure Laterality Date  . ABDOMINAL HYSTERECTOMY  1976  . BACK SURGERY    . KNEE ARTHROSCOPY     left x 2  . [place]    reports that she quit smoking about 27 years ago. She has a 7.50 pack-year smoking history. She has never used smokeless tobacco. She reports that she does not drink alcohol or use drugs. family history includes Diabetes in her mother and sister; Heart disease in her brother and mother; Hypertension in her brother and mother; Kidney disease in her mother; Stroke in her brother; Thyroid disease in her mother. Allergies  Allergen Reactions  . Aspirin Nausea And Vomiting    Can take EC asa  . Contrast Media [Iodinated Diagnostic Agents] Nausea And Vomiting and Swelling    Patients throat closes with nausea and  vomiting   . Metformin And Related Nausea And Vomiting    Severe nausea and vomiting  . Shellfish Allergy Nausea And Vomiting  . Tramadol Nausea And Vomiting and Other (See Comments)    Migraine  . Penicillins   . Statins Other (See Comments)    myalgias   Current Outpatient Medications on File Prior to Visit  Medication Sig Dispense Refill  . ACCU-CHEK SOFTCLIX LANCETS lancets Use to check blood sugars three times a day Dx code: E11.9 300 each 1  . amLODipine (NORVASC) 5 MG tablet TAKE 1 TABLET (5 MG TOTAL) BY MOUTH DAILY. 90 tablet 3  . Blood Glucose Monitoring Suppl (ACCU-CHEK NANO SMARTVIEW) w/Device KIT Use to check blood sugar as directed up to 3 times a day. 1 kit 0  . gabapentin (NEURONTIN) 100 MG capsule Take 1 capsule (100 mg total) by mouth 2 (two) times daily. 60 capsule 3  . gabapentin (NEURONTIN) 400 MG capsule Take 1 capsule (400 mg total) by mouth 3 (three) times daily. 270 capsule 1  . glimepiride (AMARYL) 4 MG tablet Take 1 tablet (4 mg total) by mouth daily with breakfast. Needs office visit for more refills. 90 tablet 3  . glucose blood (ACCU-CHEK AVIVA PLUS) test strip 1 each by Other route 3 (three) times daily. Use to check blood sugars three times a day Dx E11.8 300 each 1  .  Insulin Pen Needle 32G X 4 MM MISC Use to inject  lantus once daily 100 each 4  . quinapril (ACCUPRIL) 40 MG tablet Take 1 tablet (40 mg total) by mouth daily. 90 tablet 3  . ranitidine (ZANTAC) 150 MG tablet Take 150 mg by mouth 2 (two) times daily as needed for heartburn.    Marland Kitchen tiZANidine (ZANAFLEX) 4 MG tablet   1  . Vitamin D, Ergocalciferol, (DRISDOL) 50000 units CAPS capsule Take 1 capsule (50,000 Units total) by mouth every 7 (seven) days. 12 capsule 0  . zolpidem (AMBIEN) 10 MG tablet Take 1 tablet (10 mg total) by mouth at bedtime as needed. 30 tablet 2   No current facility-administered medications on file prior to visit.    Review of Systems  Constitutional: Negative for other  unusual diaphoresis or sweats HENT: Negative for ear discharge or swelling Eyes: Negative for other worsening visual disturbances Respiratory: Negative for stridor or other swelling  Gastrointestinal: Negative for worsening distension or other blood Genitourinary: Negative for retention or other urinary change Musculoskeletal: Negative for other MSK pain or swelling Skin: Negative for color change or other new lesions Neurological: Negative for worsening tremors and other numbness  Psychiatric/Behavioral: Negative for worsening agitation or other fatigue All other system neg per pt    Objective:   Physical Exam BP 126/84   Pulse 95   Temp 98.6 F (37 C) (Oral)   Ht 5' 2"  (1.575 m)   Wt 153 lb (69.4 kg)   SpO2 96%   BMI 27.98 kg/m  VS noted,  Constitutional: Pt appears in NAD HENT: Head: NCAT.  Right Ear: External ear normal.  Left Ear: External ear normal.  Eyes: . Pupils are equal, round, and reactive to light. Conjunctivae and EOM are normal Nose: without d/c or deformity Neck: Neck supple. Gross normal ROM Cardiovascular: Normal rate and regular rhythm.   Pulmonary/Chest: Effort normal and breath sounds without rales or wheezing.  Neurological: Pt is alert. At baseline orientation, motor grossly intact Skin: Skin is warm. No rashes, other new lesions, no LE edema but has several fatty deposits to both legs near the knees Also marked enlargement of left sternoclavicular noted without significant soft tissue swelling or tenderness Psychiatric: Pt behavior is normal without agitation  No other exam findings    Assessment & Plan:

## 2017-10-22 NOTE — Telephone Encounter (Signed)
Patient stated she never heard back about her imaging from her spinal area---but per dr Tamala Julian, all results are normal---patient can see someone else if she thinks gabapentin is not working until she sees dr Tamala Julian on 6/12, but there are no other openings with dr Tamala Julian right now--patient stated she may be able to wait until 6/12, will not make another appt right now, but will call back if she changes her mind

## 2017-10-22 NOTE — Telephone Encounter (Signed)
Patient said she never received her results and never got them mailed to her. Please advise. Call back @ [phone]

## 2017-10-22 NOTE — Telephone Encounter (Signed)
Tiffany [name] I see notations from you in the lab results. Did you speak with this patient?

## 2017-11-03 ENCOUNTER — Ambulatory Visit: Payer: Medicare Other | Admitting: Family Medicine

## 2017-11-04 NOTE — Progress Notes (Signed)
[facility] [address] [place], [place] [address] Phone: [phone] Subjective:    I'm seeing this patient by the request  of:    CC: Left shoulder and chest pain  EQA:[id number]  Tiffany [name] is a 67 y.o. female coming in with complaint of left shoulder pain.  Patient was having shoulder pain but had significant swelling of the sternoclavicular joint.  Patient was seen by me and had no evidence of increasing Doppler flow as well as some underlying erosive changes noted on ultrasound.  Sent for a CT scan.  This was independently visualized by me showing the patient did have a very marginal spurring of both the sternoclavicular joints bilaterally and the first costal joints.  No true erosive changes are noted.  Patient then was lost to follow-up this coming back with worsening pain again.  States that the swelling has gotten so bad previously that it got up to her chin.  Sometimes feel like she is short of breath.  Patient denies any fevers or chills but states that she is never without any pain.  Patient states that it affects daily activities.  Has noted that she can lift less and less.  Feels like the clavicle seems to move whenever she uses her shoulder as well.  Sometimes can wake her up with night with a severe pain as well.       Past Medical History:  Diagnosis Date  . Degenerative joint disease   . Diabetes mellitus   . Headache   . Hyperlipidemia   . Hypertension   . Sleep apnea    stopped using it   Past Surgical History:  Procedure Laterality Date  . ABDOMINAL HYSTERECTOMY  1976  . BACK SURGERY    . KNEE ARTHROSCOPY     left x 2  . LUMBAR LAMINECTOMY  1989   Social History   Socioeconomic History  . Marital status: Single    Spouse name: Not on file  . Number of children: Not on file  . Years of education: 8  . Highest education level: Not on file  Occupational History  . Occupation: Disability  Social Needs  . Financial  resource strain: Not on file  . Food insecurity:    Worry: Not on file    Inability: Not on file  . Transportation needs:    Medical: Not on file    Non-medical: Not on file  Tobacco Use  . Smoking status: Former Smoker    Packs/day: 0.50    Years: 15.00    Pack years: 7.50    Last attempt to quit: 08/02/1990    Years since quitting: 27.2  . Smokeless tobacco: Never Used  Substance and Sexual Activity  . Alcohol use: No    Alcohol/week: 0.0 oz  . Drug use: No  . Sexual activity: Never  Lifestyle  . Physical activity:    Days per week: Not on file    Minutes per session: Not on file  . Stress: Not on file  Relationships  . Social connections:    Talks on phone: Not on file    Gets together: Not on file    Attends religious service: Not on file    Active member of club or organization: Not on file    Attends meetings of clubs or organizations: Not on file    Relationship status: Not on file  Other Topics Concern  . Not on file  Social History Narrative  Born in [place], [place].   Completed the 8th grade   Fun: Walk, crochet, puzzles   Diet - Variety of foods, does not eat breakfast   2 children - 44 and 45    6 grandchildren - 7 great grandchildren.    Allergies  Allergen Reactions  . Aspirin Nausea And Vomiting    Can take EC asa  . Contrast Media [Iodinated Diagnostic Agents] Nausea And Vomiting and Swelling    Patients throat closes with nausea and vomiting   . Metformin And Related Nausea And Vomiting    Severe nausea and vomiting  . Shellfish Allergy Nausea And Vomiting  . Tramadol Nausea And Vomiting and Other (See Comments)    Migraine  . Penicillins   . Statins Other (See Comments)    myalgias   Family History  Problem Relation Age of Onset  . Hypertension Mother   . Diabetes Mother   . Thyroid disease Mother   . Heart disease Mother   . Kidney disease Mother   . Diabetes Sister   . Hypertension Brother   . Stroke Brother   . Heart disease  Brother   . Colon cancer Neg Hx   . Colon polyps Neg Hx   . Rectal cancer Neg Hx   . Stomach cancer Neg Hx      Past medical history, social, surgical and family history all reviewed in electronic medical record.  No pertanent information unless stated regarding to the chief complaint.   Review of Systems:Review of systems updated and as accurate as of 11/05/17  No headache, visual changes, nausea, vomiting, diarrhea, constipation, dizziness, abdominal pain, skin rash, fevers, chills, night sweats, weight loss, swollen lymph nodes, chest pain, shortness of breath, mood changes.  Mild positive muscle aches, joint swelling, body aches   Objective  Blood pressure 130/86, pulse 82, height 5\' 2"  (1.575 m), weight 154 lb (69.9 kg), SpO2 94 %. Systems examined below as of 11/05/17   General: No apparent distress alert and oriented x3 mood and affect normal, dressed appropriately.  HEENT: Pupils equal, extraocular movements intact  Respiratory: Patient's speak in full sentences and does not appear short of breath  Cardiovascular: No lower extremity edema, non tender, no erythema  Skin: Warm dry intact with no signs of infection or rash on extremities or on axial skeleton.  Abdomen: Soft nontender  Neuro: Cranial nerves II through XII are intact, neurovascularly intact in all extremities with 2+ DTRs and 2+ pulses.  Lymph: No lymphadenopathy of posterior or anterior cervical chain or axillae bilaterally.  Gait normal with good balance and coordination.  MSK:  Non tender with full range of motion and good stability and symmetric strength and tone of  elbows, wrist, hip, knee and ankles bilaterally.   Negative patient patient's left [place] joint is significantly inflamed and tender to palpation.  Left shoulder movement does show that patient has some mild atypical elevation of the [place] joints with abduction of the arm.  Mild crepitus noted there as well as at the acromioclavicular joint.  Severe amount of  pain at even 40 degrees of forward flexion of the shoulder.  Positive impingement and positive crossover.  Limited musculoskeletal ultrasound was performed and interpreted by Lyndal Pulley  Limited ultrasound the patient does have a small effusion in both his sternoclavicular as well as the acromioclavicular joints of the left shoulder.  No erosive changes noted at this time of the bone.  No increasing in Doppler flow.  Dynamic testing does  not show true gapping of the [place] joint but potentially the acromioclavicular Impression: Sternoclavicular effusion as well as acromioclavicular effusion.     Impression and Recommendations:     This case required medical decision making of moderate complexity.      Note: This dictation was prepared with Dragon dictation along with smaller phrase technology. Any transcriptional errors that result from this process are unintentional.

## 2017-11-05 ENCOUNTER — Ambulatory Visit: Payer: Self-pay

## 2017-11-05 ENCOUNTER — Encounter: Payer: Self-pay | Admitting: Family Medicine

## 2017-11-05 ENCOUNTER — Ambulatory Visit: Payer: Medicare Other | Admitting: Family Medicine

## 2017-11-05 VITALS — BP 130/86 | HR 82 | Ht 62.0 in | Wt 154.0 lb

## 2017-11-05 DIAGNOSIS — M25512 Pain in left shoulder: Secondary | ICD-10-CM

## 2017-11-05 DIAGNOSIS — M79602 Pain in left arm: Secondary | ICD-10-CM

## 2017-11-05 MED ORDER — TIZANIDINE HCL 4 MG PO TABS: 4.0000 mg | ORAL_TABLET | Freq: Three times a day (TID) | ORAL | 1 refills | Status: DC | PRN

## 2017-11-05 NOTE — Assessment & Plan Note (Signed)
Patient continues to have pain and swelling.  I do think that we need to rule out such things as possible inflammatory arthropathy.  Laboratory work-up had been fairly unremarkable but I do feel that it is worth of doing advanced imaging.  I do believe that instability of the clavicle is also a concern with an occult fracture.  Depending on findings this could change medical management.  This test will be ordered today and depending on this we will discuss with patient about the next options in her care.  Unable to do prednisone at this point secondary to brittle diabetes

## 2017-11-05 NOTE — Patient Instructions (Signed)
Good to see you  Tiffany [name] is your friend.  Dr. Mardelle Matte will see you and be great   I will call radiology and figure out best test and then get you situated.  I am sorry you are in pain and will try to speed it up

## 2017-11-06 ENCOUNTER — Other Ambulatory Visit: Payer: Self-pay | Admitting: *Deleted

## 2017-11-06 DIAGNOSIS — M25512 Pain in left shoulder: Secondary | ICD-10-CM

## 2017-11-16 ENCOUNTER — Ambulatory Visit
Admission: RE | Admit: 2017-11-16 | Discharge: 2017-11-16 | Disposition: A | Discharge status: Home / Self Care | Payer: Medicare Other | Source: Ambulatory Visit | Attending: Family Medicine | Admitting: Family Medicine

## 2017-11-16 DIAGNOSIS — M19012 Primary osteoarthritis, left shoulder: Secondary | ICD-10-CM | POA: Diagnosis not present

## 2017-11-16 DIAGNOSIS — M25512 Pain in left shoulder: Secondary | ICD-10-CM

## 2017-11-16 MED ORDER — GADOBENATE DIMEGLUMINE 529 MG/ML IV SOLN
13.0000 mL | Freq: Once | INTRAVENOUS | Status: AC | PRN
  Administered 2017-11-16: 13 mL via INTRAVENOUS

## 2017-11-18 ENCOUNTER — Other Ambulatory Visit (INDEPENDENT_AMBULATORY_CARE_PROVIDER_SITE_OTHER): Payer: Medicare Other

## 2017-11-18 ENCOUNTER — Ambulatory Visit: Payer: Medicare Other | Admitting: Internal Medicine

## 2017-11-18 ENCOUNTER — Encounter: Payer: Self-pay | Admitting: Internal Medicine

## 2017-11-18 ENCOUNTER — Other Ambulatory Visit: Payer: Self-pay | Admitting: Internal Medicine

## 2017-11-18 VITALS — BP 128/86 | HR 87 | Temp 98.6°F | Ht 62.0 in | Wt 154.0 lb

## 2017-11-18 DIAGNOSIS — R778 Other specified abnormalities of plasma proteins: Secondary | ICD-10-CM

## 2017-11-18 DIAGNOSIS — E782 Mixed hyperlipidemia: Secondary | ICD-10-CM

## 2017-11-18 DIAGNOSIS — E1165 Type 2 diabetes mellitus with hyperglycemia: Secondary | ICD-10-CM

## 2017-11-18 DIAGNOSIS — I10 Essential (primary) hypertension: Secondary | ICD-10-CM | POA: Diagnosis not present

## 2017-11-18 DIAGNOSIS — IMO0001 Reserved for inherently not codable concepts without codable children: Secondary | ICD-10-CM

## 2017-11-18 LAB — LIPID PANEL
CHOL/HDL RATIO: 3
Cholesterol: 189 mg/dL (ref 0–200)
HDL: 58.3 mg/dL (ref >39.00)
LDL Cholesterol: 117 mg/dL — ABNORMAL HIGH (ref 0–99)
NONHDL: 130.46
TRIGLYCERIDES: 65 mg/dL (ref 0.0–149.0)
VLDL: 13 mg/dL (ref 0.0–40.0)

## 2017-11-18 LAB — BASIC METABOLIC PANEL
BUN: 12 mg/dL (ref 6–23)
CALCIUM: 9.6 mg/dL (ref 8.4–10.5)
CO2: 27 meq/L (ref 19–32)
CREATININE: 0.67 mg/dL (ref 0.40–1.20)
Chloride: 101 mEq/L (ref 96–112)
GFR: 113.13 mL/min (ref >60.00)
GLUCOSE: 167 mg/dL — AB (ref 70–99)
Potassium: 4.1 mEq/L (ref 3.5–5.1)
Sodium: 136 mEq/L (ref 135–145)

## 2017-11-18 LAB — HEPATIC FUNCTION PANEL
ALBUMIN: 4.3 g/dL (ref 3.5–5.2)
ALK PHOS: 76 U/L (ref 39–117)
ALT: 21 U/L (ref 0–35)
AST: 16 U/L (ref 0–37)
Bilirubin, Direct: 0 mg/dL (ref 0.0–0.3)
Total Bilirubin: 0.3 mg/dL (ref 0.2–1.2)
Total Protein: 8.8 g/dL — ABNORMAL HIGH (ref 6.0–8.3)

## 2017-11-18 LAB — HEMOGLOBIN A1C: Hgb A1c MFr Bld: 8.4 % — ABNORMAL HIGH (ref 4.6–6.5)

## 2017-11-18 MED ORDER — DULAGLUTIDE 0.75 MG/0.5ML ~~LOC~~ SOAJ: SUBCUTANEOUS | 3 refills | Status: DC

## 2017-11-18 NOTE — Progress Notes (Signed)
Subjective:    Patient ID: Tiffany [name], female    DOB: 22-Dec-1950, 67 y.o.   MRN: [id number]  HPI  Here to f/u; overall doing ok,  Pt denies chest pain, increasing sob or doe, wheezing, orthopnea, PND, increased LE swelling, palpitations, dizziness or syncope.  Pt denies new neurological symptoms such as new headache, or facial or extremity weakness or numbness.  Pt denies polydipsia, polyuria, or low sugar episode.  Pt states overall good compliance with meds, mostly trying to follow appropriate diet, with wt overall stable,  but little exercise however.  Also with recent slightly elev tot prot and SPEP.   Past Medical History:  Diagnosis Date  . Degenerative joint disease   . Diabetes mellitus   . Headache   . Hyperlipidemia   . Hypertension   . Sleep apnea    stopped using it   Past Surgical History:  Procedure Laterality Date  . ABDOMINAL HYSTERECTOMY  1976  . BACK SURGERY    . KNEE ARTHROSCOPY     left x 2  . [place]    reports that she quit smoking about 27 years ago. She has a 7.50 pack-year smoking history. She has never used smokeless tobacco. She reports that she does not drink alcohol or use drugs. family history includes Diabetes in her mother and sister; Heart disease in her brother and mother; Hypertension in her brother and mother; Kidney disease in her mother; Stroke in her brother; Thyroid disease in her mother. Allergies  Allergen Reactions  . Aspirin Nausea And Vomiting    Can take EC asa  . Contrast Media [Iodinated Diagnostic Agents] Nausea And Vomiting and Swelling    Patients throat closes with nausea and vomiting   . Gadolinium Derivatives Nausea And Vomiting  . Metformin And Related Nausea And Vomiting    Severe nausea and vomiting  . Shellfish Allergy Nausea And Vomiting  . Tramadol Nausea And Vomiting and Other (See Comments)    Migraine  . Penicillins   . Statins Other (See Comments)    myalgias   Current Outpatient  Medications on File Prior to Visit  Medication Sig Dispense Refill  . ACCU-CHEK SOFTCLIX LANCETS lancets Use to check blood sugars three times a day Dx code: E11.9 300 each 1  . amLODipine (NORVASC) 5 MG tablet TAKE 1 TABLET (5 MG TOTAL) BY MOUTH DAILY. 90 tablet 3  . Blood Glucose Monitoring Suppl (ACCU-CHEK NANO SMARTVIEW) w/Device KIT Use to check blood sugar as directed up to 3 times a day. 1 kit 0  . ezetimibe (ZETIA) 10 MG tablet Take 1 tablet (10 mg total) by mouth daily. 90 tablet 3  . gabapentin (NEURONTIN) 100 MG capsule Take 1 capsule (100 mg total) by mouth 2 (two) times daily. 60 capsule 3  . gabapentin (NEURONTIN) 400 MG capsule Take 1 capsule (400 mg total) by mouth 3 (three) times daily. 270 capsule 1  . glimepiride (AMARYL) 4 MG tablet Take 1 tablet (4 mg total) by mouth daily with breakfast. Needs office visit for more refills. 90 tablet 3  . glucose blood (ACCU-CHEK AVIVA PLUS) test strip 1 each by Other route 3 (three) times daily. Use to check blood sugars three times a day Dx E11.8 300 each 1  . Insulin Glargine (LANTUS SOLOSTAR) 100 UNIT/ML Solostar Pen Inject 55 Units into the skin daily. 45 mL 3  . Insulin Pen Needle 32G X 4 MM MISC Use to inject  lantus once daily 100  each 4  . quinapril (ACCUPRIL) 40 MG tablet Take 1 tablet (40 mg total) by mouth daily. 90 tablet 3  . ranitidine (ZANTAC) 150 MG tablet Take 150 mg by mouth 2 (two) times daily as needed for heartburn.    Marland Kitchen tiZANidine (ZANAFLEX) 4 MG tablet Take 1 tablet (4 mg total) by mouth every 8 (eight) hours as needed for muscle spasms. 30 tablet 1  . Vitamin D, Ergocalciferol, (DRISDOL) 50000 units CAPS capsule Take 1 capsule (50,000 Units total) by mouth every 7 (seven) days. 12 capsule 0  . zolpidem (AMBIEN) 10 MG tablet Take 1 tablet (10 mg total) by mouth at bedtime as needed. 30 tablet 2   No current facility-administered medications on file prior to visit.   ROS;  Constitutional: Negative for other unusual  diaphoresis or sweats HENT: Negative for ear discharge or swelling Eyes: Negative for other worsening visual disturbances Respiratory: Negative for stridor or other swelling  Gastrointestinal: Negative for worsening distension or other blood Genitourinary: Negative for retention or other urinary change Musculoskeletal: Negative for other MSK pain or swelling Skin: Negative for color change or other new lesions Neurological: Negative for worsening tremors and other numbness  Psychiatric/Behavioral: Negative for worsening agitation or other fatigue     Objective:   Physical Exam BP 128/86   Pulse 87   Temp 98.6 F (37 C) (Oral)   Ht _0  (1.575 m)   Wt 154 lb (69.9 kg)   SpO2 98%   BMI 28.17 kg/m  VS noted,  Constitutional: Pt appears in NAD HENT: Head: NCAT.  Right Ear: External ear normal.  Left Ear: External ear normal.  Eyes: . Pupils are equal, round, and reactive to light. Conjunctivae and EOM are normal Nose: without d/c or deformity Neck: Neck supple. Gross normal ROM Cardiovascular: Normal rate and regular rhythm.   Pulmonary/Chest: Effort normal and breath sounds without rales or wheezing.  Abd:  Soft, NT, ND, + BS, no organomegaly Neurological: Pt is alert. At baseline orientation, motor grossly intact Skin: Skin is warm. No rashes, other new lesions, no LE edema Psychiatric: Pt behavior is normal without agitation  No other exam findings Lab Results  Component Value Date   WBC 4.8 08/19/2017   HGB 14.3 08/19/2017   HCT 42.6 08/19/2017   PLT 262.0 08/19/2017   GLUCOSE 167 (H) 11/18/2017   CHOL 189 11/18/2017   TRIG 65.0 11/18/2017   HDL 58.30 11/18/2017   LDLCALC 117 (H) 11/18/2017   ALT 21 11/18/2017   AST 16 11/18/2017   NA 136 11/18/2017   K 4.1 11/18/2017   CL 101 11/18/2017   CREATININE 0.67 11/18/2017   BUN 12 11/18/2017   CO2 27 11/18/2017   TSH 1.34 03/27/2017   HGBA1C 8.4 (H) 11/18/2017   MICROALBUR <0.7 03/27/2017       Assessment &  Plan:

## 2017-11-18 NOTE — Assessment & Plan Note (Signed)
stable overall by history and exam, recent data reviewed with pt, and pt to continue medical treatment as before,  to f/u any worsening symptoms or concerns  

## 2017-11-18 NOTE — Patient Instructions (Addendum)
Please continue all other medications as before, and refills have been done if requested.  Please have the pharmacy call with any other refills you may need.  Please continue your efforts at being more active, low cholesterol diabetic diet, and weight control  Please keep your appointments with your specialists as you may have planned  Please go to the LAB in the Basement (turn left off the elevator) for the tests to be done today  You will be contacted by phone if any changes need to be made immediately.  Otherwise, you will receive a letter about your results with an explanation, but please check with MyChart first.  Please remember to sign up for MyChart if you have not done so, as this will be important to you in the future with finding out test results, communicating by private email, and scheduling acute appointments online when needed.  Please return in 6 months, or sooner if needed 

## 2017-11-18 NOTE — Assessment & Plan Note (Signed)
Also for IFE,  to f/u any worsening symptoms or concerns

## 2017-11-20 ENCOUNTER — Other Ambulatory Visit: Payer: Self-pay | Admitting: Internal Medicine

## 2017-11-20 ENCOUNTER — Telehealth: Payer: Self-pay

## 2017-11-20 DIAGNOSIS — R769 Abnormal immunological finding in serum, unspecified: Secondary | ICD-10-CM

## 2017-11-20 DIAGNOSIS — R778 Other specified abnormalities of plasma proteins: Secondary | ICD-10-CM

## 2017-11-20 LAB — IMMUNOFIXATION ELECTROPHORESIS
IgG (Immunoglobin G), Serum: 2134 mg/dL — ABNORMAL HIGH (ref 694–1618)
IgM, Serum: 46 mg/dL — ABNORMAL LOW (ref 48–271)
Immunofix Electr Int: NOT DETECTED
Immunoglobulin A: 461 mg/dL (ref 81–463)

## 2017-11-20 NOTE — Telephone Encounter (Signed)
-----   Message from Biagio Borg, MD sent at 11/20/2017 12:28 PM EDT ----- Letter sent, cont same tx except  The test results show that your current treatment is OK, except the test confirms the presence of an elevated protein that may not be normal, and could mean a type of bone marrow disorder as we discussed at your visit.  I will go ahead and refer you to Hematology/Oncology for further consideration of next steps in evaluation.  You should also hear from the office as well.Redmond Baseman to please inform pt, I will do referral

## 2017-11-20 NOTE — Telephone Encounter (Signed)
Pt has been informed and expressed understanding.  

## 2017-11-21 ENCOUNTER — Telehealth: Payer: Self-pay | Admitting: Hematology

## 2017-11-21 ENCOUNTER — Encounter: Payer: Self-pay | Admitting: Hematology

## 2017-11-21 NOTE — Telephone Encounter (Signed)
New referral received from Dr. Jenny Reichmann for dx of abnormal spep. Pt has been scheduled to see Dr. Burr Medico on 7/16 at 2:45pm. Pt aware to arrive 30 minutes early. Letter mailed.

## 2017-12-03 NOTE — Progress Notes (Signed)
[facility]  [phone] [phone] [place] consult Note   Patient Care Team: Biagio Borg, MD as PCP - General (Internal Medicine) 12/09/2017   Referring Physician: Dr. Jenny Reichmann  CHIEF COMPLAINTS/PURPOSE OF CONSULTATION:  Abnormal SPEP  HISTORY OF PRESENTING ILLNESS:  Tiffany [name] 67 y.o. female who was referred to me by Dr. Jenny Reichmann for abnormal SPEP. This is the first time I see her at my clinic. She is here today with her sister.   She complains left neck, clavicular, and shoulder pain that prevents her from doing her from raising her hands and sometimes performing daily activities. She said that the area usually swells. She also complains of lower back pain, for which she had back surgery, but she is still in constant pain. It hurts when she sits and move. She takes gabapentin and tizanidine, but none help ease her pain.   She is diabetic and hypertensive. She has OSA, but doesn't use CPAP.  She has knee surgery.  No smoking or alcohol. She is considered disabled due to her back pain.    MEDICAL HISTORY:  Past Medical History:  Diagnosis Date  . Degenerative joint disease   . Diabetes mellitus   . Headache   . Hyperlipidemia   . Hypertension   . Sleep apnea    stopped using it    SURGICAL HISTORY: Past Surgical History:  Procedure Laterality Date  . ABDOMINAL HYSTERECTOMY  1976  . BACK SURGERY    . KNEE ARTHROSCOPY     left x 2  . LUMBAR LAMINECTOMY  1989    SOCIAL HISTORY: Social History   Socioeconomic History  . Marital status: Single    Spouse name: Not on file  . Number of children: Not on file  . Years of education: 8  . Highest education level: Not on file  Occupational History  . Occupation: Disability  Social Needs  . Financial resource strain: Not on file  . Food insecurity:    Worry: Not on file    Inability: Not on file  . Transportation needs:    Medical: Not on file    Non-medical: Not on file    Tobacco Use  . Smoking status: Former Smoker    Packs/day: 0.50    Years: 15.00    Pack years: 7.50    Last attempt to quit: 08/02/1990    Years since quitting: 27.3  . Smokeless tobacco: Never Used  Substance and Sexual Activity  . Alcohol use: No    Alcohol/week: 0.0 oz  . Drug use: No  . Sexual activity: Never  Lifestyle  . Physical activity:    Days per week: Not on file    Minutes per session: Not on file  . Stress: Not on file  Relationships  . Social connections:    Talks on phone: Not on file    Gets together: Not on file    Attends religious service: Not on file    Active member of club or organization: Not on file    Attends meetings of clubs or organizations: Not on file    Relationship status: Not on file  . Intimate partner violence:    Fear of current or ex partner: Not on file    Emotionally abused: Not on file    Physically abused: Not on file    Forced sexual activity: Not on file  Other Topics Concern  . Not on file  Social History Narrative   Born  in [place], [place].   Completed the 8th grade   Fun: Walk, crochet, puzzles   Diet - Variety of foods, does not eat breakfast   2 children - 44 and 45    6 grandchildren - 7 great grandchildren.     FAMILY HISTORY: Family History  Problem Relation Age of Onset  . Hypertension Mother   . Diabetes Mother   . Thyroid disease Mother   . Heart disease Mother   . Kidney disease Mother   . Diabetes Sister   . Hypertension Brother   . Stroke Brother   . Heart disease Brother   . Colon cancer Neg Hx   . Colon polyps Neg Hx   . Rectal cancer Neg Hx   . Stomach cancer Neg Hx     ALLERGIES:  is allergic to aspirin; contrast media [iodinated diagnostic agents]; gadolinium derivatives; metformin and related; shellfish allergy; tramadol; penicillins; and statins.  MEDICATIONS:  Current Outpatient Medications  Medication Sig Dispense Refill  . amLODipine (NORVASC) 5 MG tablet TAKE 1 TABLET (5 MG TOTAL) BY  MOUTH DAILY. 90 tablet 3  . Blood Glucose Monitoring Suppl (ACCU-CHEK NANO SMARTVIEW) w/Device KIT Use to check blood sugar as directed up to 3 times a day. 1 kit 0  . ezetimibe (ZETIA) 10 MG tablet Take 1 tablet (10 mg total) by mouth daily. 90 tablet 3  . gabapentin (NEURONTIN) 100 MG capsule Take 1 capsule (100 mg total) by mouth 2 (two) times daily. 60 capsule 3  . gabapentin (NEURONTIN) 400 MG capsule Take 1 capsule (400 mg total) by mouth 3 (three) times daily. 270 capsule 1  . glimepiride (AMARYL) 4 MG tablet Take 1 tablet (4 mg total) by mouth daily with breakfast. Needs office visit for more refills. 90 tablet 3  . glucose blood (ACCU-CHEK AVIVA PLUS) test strip 1 each by Other route 3 (three) times daily. Use to check blood sugars three times a day Dx E11.8 300 each 1  . Insulin Glargine (LANTUS SOLOSTAR) 100 UNIT/ML Solostar Pen Inject 55 Units into the skin daily. 45 mL 3  . Insulin Pen Needle 32G X 4 MM MISC Use to inject  lantus once daily 100 each 4  . quinapril (ACCUPRIL) 40 MG tablet Take 1 tablet (40 mg total) by mouth daily. 90 tablet 3  . ranitidine (ZANTAC) 150 MG tablet Take 150 mg by mouth 2 (two) times daily as needed for heartburn.    Marland Kitchen tiZANidine (ZANAFLEX) 4 MG tablet Take 1 tablet (4 mg total) by mouth every 8 (eight) hours as needed for muscle spasms. 30 tablet 1  . Vitamin D, Ergocalciferol, (DRISDOL) 50000 units CAPS capsule Take 1 capsule (50,000 Units total) by mouth every 7 (seven) days. 12 capsule 0  . zolpidem (AMBIEN) 10 MG tablet Take 1 tablet (10 mg total) by mouth at bedtime as needed. 30 tablet 2  . ACCU-CHEK SOFTCLIX LANCETS lancets Use to check blood sugars three times a day Dx code: E11.9 300 each 1  . Dulaglutide (TRULICITY) 5.10 CH/8.5ID SOPN 0.5 ml subq weekly (Patient not taking: Reported on 12/09/2017) 6 mL 3   No current facility-administered medications for this visit.     REVIEW OF SYSTEMS:  Constitutional: Denies fevers, chills or abnormal  night sweats Eyes: Denies blurriness of vision, double vision or watery eyes Ears, nose, mouth, throat, and face: Denies mucositis or sore throat Respiratory: Denies cough, dyspnea or wheezes Cardiovascular: Denies palpitation, chest discomfort or lower extremity swelling Gastrointestinal:  Denies  nausea, heartburn or change in bowel habits Skin: Denies abnormal skin rashes Lymphatics: Denies new lymphadenopathy or easy bruising Neurological:Denies numbness, tingling or new weaknesses Behavioral/Psych: Mood is stable, no new changes  MSK: (+) lower back pain (+) left neck, shoulder, and clavicular pain All other systems were reviewed with the patient and are negative.  PHYSICAL EXAMINATION: ECOG PERFORMANCE STATUS: 1 - Symptomatic but completely ambulatory  Vitals:   12/09/17 1444  BP: (!) 143/91  Pulse: 91  Resp: 18  Temp: 98.5 F (36.9 C)  SpO2: 100%   Filed Weights   12/09/17 1444  Weight: 159 lb 6.4 oz (72.3 kg)    GENERAL:alert, no distress and comfortable SKIN: skin color, texture, turgor are normal, no rashes or significant lesions EYES: normal, conjunctiva are pink and non-injected, sclera clear OROPHARYNX:no exudate, no erythema and lips, buccal mucosa, and tongue normal  NECK: supple, thyroid normal size, non-tender, without nodularity LYMPH:  no palpable lymphadenopathy in the cervical, axillary or inguinal LUNGS: clear to auscultation and percussion with normal breathing effort HEART: regular rate & rhythm and no murmurs and no lower extremity edema ABDOMEN:abdomen soft, non-tender and normal bowel sounds Musculoskeletal:no cyanosis of digits and no clubbing  (+) back pain PSYCH: alert & oriented x 3 with fluent speech NEURO: no focal motor/sensory deficits  LABORATORY DATA:  I have reviewed the data as listed CBC Latest Ref Rng & Units 08/19/2017 03/27/2017 03/06/2016  WBC 4.0 - 10.5 K/uL 4.8 3.7(L) 5.7  Hemoglobin 12.0 - 15.0 g/dL 14.3 13.9 13.9  Hematocrit  36.0 - 46.0 % 42.6 42.3 41.0  Platelets 150.0 - 400.0 K/uL 262.0 270.0 282.0    CMP Latest Ref Rng & Units 11/18/2017 08/21/2017 08/19/2017  Glucose 70 - 99 mg/dL 167(H) - 239(H)  BUN 6 - 23 mg/dL 12 - 13  Creatinine 0.40 - 1.20 mg/dL 0.67 - 0.63  Sodium 135 - 145 mEq/L 136 - 134(L)  Potassium 3.5 - 5.1 mEq/L 4.1 - 3.9  Chloride 96 - 112 mEq/L 101 - 100  CO2 19 - 32 mEq/L 27 - 26  Calcium 8.4 - 10.5 mg/dL 9.6 - 9.7  Total Protein 6.0 - 8.3 g/dL 8.8(H) 8.2(H) 9.3(H)  Total Bilirubin 0.2 - 1.2 mg/dL 0.3 - 0.4  Alkaline Phos 39 - 117 U/L 76 - 89  AST 0 - 37 U/L 16 - 22  ALT 0 - 35 U/L 21 - 36(H)   CBC    Component Value Date/Time   WBC 4.8 08/19/2017 1519   RBC 4.87 08/19/2017 1519   HGB 14.3 08/19/2017 1519   HCT 42.6 08/19/2017 1519   PLT 262.0 08/19/2017 1519   MCV 87.6 08/19/2017 1519   MCH 28.0 10/07/2015 0628   MCHC 33.4 08/19/2017 1519   RDW 14.2 08/19/2017 1519   LYMPHSABS 1.1 08/19/2017 1519   MONOABS 0.3 08/19/2017 1519   EOSABS 0.0 08/19/2017 1519   BASOSABS 0.0 08/19/2017 1519   Iron/TIBC/Ferritin/ %Sat    Component Value Date/Time   IRON 63 04/15/2017 1604   IRONPCTSAT 19.2 (L) 04/15/2017 1604      RADIOGRAPHIC STUDIES: I have personally reviewed the radiological images as listed and agreed with the findings in the report. Mr Chest W Wo Contrast  Result Date: 11/16/2017 CLINICAL DATA:  Left-sided sternoclavicular joint pain and swelling for 8 months. EXAM: MRI STERNOCLAVICULAR JOINTS TECHNIQUE: Multiplanar, multiecho pulse sequences of the sternoclavicular joints was obtained pre and post administration of IV contrast. CONTRAST:  48m MULTIHANCE GADOBENATE DIMEGLUMINE 529 MG/ML IV SOLN  COMPARISON:  CT scan 09/03/2017 FINDINGS: Moderate bilateral sternoclavicular joint degenerative changes, left greater than right. There are also degenerative changes at the first rib articulation with the sternum, left greater than right. There are small joint effusions and  enhancing synovitis bilaterally, left more pronounced than right. No obvious erosions to suggest an erosive arthropathy. No sternal or clavicle fracture. No rib fractures. No supraclavicular process. IMPRESSION: 1. Moderate bilateral sternoclavicular joint arthropathy, left greater than right with joint effusions and enhancing synovitis. No obvious erosions. 2. No fracture or worrisome bone lesion. Electronically Signed   By: Marijo Sanes M.D.   On: 11/16/2017 16:36    ASSESSMENT & PLAN:  BRANDEE MARKIN is a 67 y.o. female with history of OA, DM, HA, HTN and OSA.  1. Abnormal SPEP, elevated IgG level  -I have reviewed her lab results. I am not sure why the SPEP was ordered, but it showed slightly elevated immunoglobulin IgG level, a poorly defined band of restricted protein mobility is detected in the gamma globin area, and immunofixation showed no monoclonal protein detected.  -I will repeat her SPEP with immunofixation, light chain levels, and will obtain 24-hour urine for UPEP and light chain level.  If those tests are all negative, I do not think he needs further work-up.  Patient does not have anemia, renal insufficiency, or hypercalcemia, the clinical suspicion for multiple myeloma is very low.  She does have severe low back pain, likely secondary to her previous back surgery and degenerative change, her multiple image of her spine showed no significant bone lesions or any suspicion for multiple myeloma -I explained to her that her slightly elevated immunoglobulin G level is nonspecific, likely related to her chronic inflammation from her diabetes and or arthritis. -If her repeated labs are negative, she does not need to follow-up with me.  2. Back pain - She had previous back surgery in May 2017 for lumbar interbody fusion.  3. Left neck, clavicular, and shoulder pain - She follows up with sports medicine dr.  4. DM, HTN -f/u with Dr. Jenny Reichmann   Plan: Lab today, including SPEP with  immunofixation, light chains level, and 24-hour urine for UPEP and light chain levels.  I will call her with the lab results in a few weeks. F/u as needed    All questions were answered. The patient knows to call the clinic with any problems, questions or concerns. I spent 30 minutes counseling the patient face to face. The total time spent in the appointment was 40 minutes and more than 50% was on counseling.     Truitt Merle, MD 12/09/2017

## 2017-12-09 ENCOUNTER — Encounter: Payer: Self-pay | Admitting: Hematology

## 2017-12-09 ENCOUNTER — Inpatient Hospital Stay: Payer: Medicare Other

## 2017-12-09 ENCOUNTER — Inpatient Hospital Stay: Payer: Medicare Other | Attending: Hematology | Admitting: Hematology

## 2017-12-09 ENCOUNTER — Other Ambulatory Visit: Payer: Self-pay | Admitting: Internal Medicine

## 2017-12-09 VITALS — BP 143/91 | HR 91 | Temp 98.5°F | Resp 18 | Ht 62.0 in | Wt 159.4 lb

## 2017-12-09 DIAGNOSIS — I10 Essential (primary) hypertension: Secondary | ICD-10-CM | POA: Diagnosis not present

## 2017-12-09 DIAGNOSIS — M25519 Pain in unspecified shoulder: Secondary | ICD-10-CM

## 2017-12-09 DIAGNOSIS — M542 Cervicalgia: Secondary | ICD-10-CM | POA: Insufficient documentation

## 2017-12-09 DIAGNOSIS — E119 Type 2 diabetes mellitus without complications: Secondary | ICD-10-CM

## 2017-12-09 DIAGNOSIS — R778 Other specified abnormalities of plasma proteins: Secondary | ICD-10-CM | POA: Insufficient documentation

## 2017-12-09 DIAGNOSIS — M549 Dorsalgia, unspecified: Secondary | ICD-10-CM | POA: Diagnosis not present

## 2017-12-10 LAB — PROTEIN ELECTROPHORESIS, SERUM, WITH REFLEX
A/G Ratio: 0.8 (ref 0.7–1.7)
ALBUMIN ELP: 3.5 g/dL (ref 2.9–4.4)
ALPHA-1-GLOBULIN: 0.3 g/dL (ref 0.0–0.4)
ALPHA-2-GLOBULIN: 1 g/dL (ref 0.4–1.0)
BETA GLOBULIN: 1.1 g/dL (ref 0.7–1.3)
Gamma Globulin: 1.9 g/dL — ABNORMAL HIGH (ref 0.4–1.8)
Globulin, Total: 4.3 g/dL — ABNORMAL HIGH (ref 2.2–3.9)
Total Protein ELP: 7.8 g/dL (ref 6.0–8.5)

## 2017-12-10 LAB — KAPPA/LAMBDA LIGHT CHAINS
Kappa free light chain: 54 mg/L — ABNORMAL HIGH (ref 3.3–19.4)
Kappa, lambda light chain ratio: 1.5 (ref 0.26–1.65)
Lambda free light chains: 36.1 mg/L — ABNORMAL HIGH (ref 5.7–26.3)

## 2017-12-10 NOTE — Telephone Encounter (Signed)
09/18/2017  30# 

## 2017-12-12 DIAGNOSIS — R778 Other specified abnormalities of plasma proteins: Secondary | ICD-10-CM | POA: Diagnosis not present

## 2017-12-15 ENCOUNTER — Other Ambulatory Visit: Payer: Self-pay | Admitting: Internal Medicine

## 2017-12-15 ENCOUNTER — Other Ambulatory Visit: Payer: Self-pay | Admitting: Family

## 2017-12-16 LAB — UPEP/UIFE/LIGHT CHAINS/TP, 24-HR UR
% BETA, Urine: 0 %
ALBUMIN, U: 100 %
ALPHA 1 URINE: 0 %
Alpha 2, Urine: 0 %
FREE KAPPA LT CHAINS, UR: 9.75 mg/L (ref 1.35–24.19)
FREE LAMBDA LT CHAINS, UR: 1.05 mg/L (ref 0.24–6.66)
Free Kappa/Lambda Ratio: 9.29 (ref 2.04–10.37)
GAMMA GLOBULIN URINE: 0 %
TOTAL PROTEIN, URINE-UPE24: 9.9 mg/dL
TOTAL VOLUME: 2850
Total Protein, Urine-Ur/day: 282 mg/24 hr — ABNORMAL HIGH (ref 30–150)

## 2017-12-19 ENCOUNTER — Telehealth: Payer: Self-pay

## 2017-12-19 NOTE — Telephone Encounter (Signed)
Per Dr. Burr Medico left voice message for patient regarding lab results, blood and urine test were both negative for M-protein, no concerns for MGUS or MM, no need to see Dr. Burr Medico back.  Encouraged patient to call back if she has questions.

## 2017-12-19 NOTE — Telephone Encounter (Signed)
-----   Message from Wilmon Arms, RN sent at 12/18/2017  2:56 PM EDT -----   ----- Message ----- From: Truitt Merle, MD Sent: 12/18/2017  11:12 AM To: Wilmon Arms, RN  Kim, please call pt and let him know the lab results, both blood and urine tests were negative for M-protein, no concerns for MGUS or MM, no need to see me back, thanks.  Truitt Merle  12/18/2017

## 2018-01-08 ENCOUNTER — Telehealth: Payer: Self-pay | Admitting: Internal Medicine

## 2018-01-12 DIAGNOSIS — E118 Type 2 diabetes mellitus with unspecified complications: Secondary | ICD-10-CM | POA: Diagnosis not present

## 2018-01-20 MED ORDER — ACCU-CHEK SOFTCLIX LANCETS MISC: 1 refills | Status: DC

## 2018-01-20 NOTE — Telephone Encounter (Signed)
Patient states the pharmacy never received the refill for her lancets. Please re-send to [facility] at [address].

## 2018-01-20 NOTE — Addendum Note (Signed)
Addended by: Earnstine Regal on: 01/20/2018 03:59 PM   Modules accepted: Orders

## 2018-01-20 NOTE — Telephone Encounter (Signed)
Resent electronically to pof.Marland KitchenJohny [name]

## 2018-01-22 ENCOUNTER — Other Ambulatory Visit: Payer: Self-pay

## 2018-01-22 MED ORDER — ACCU-CHEK SOFTCLIX LANCETS MISC: 2 refills | Status: DC

## 2018-01-25 DIAGNOSIS — E119 Type 2 diabetes mellitus without complications: Secondary | ICD-10-CM | POA: Diagnosis not present

## 2018-02-17 ENCOUNTER — Other Ambulatory Visit: Payer: Self-pay | Admitting: Family Medicine

## 2018-02-17 NOTE — Telephone Encounter (Signed)
Refill done.  

## 2018-03-02 ENCOUNTER — Other Ambulatory Visit: Payer: Self-pay | Admitting: Internal Medicine

## 2018-03-07 ENCOUNTER — Other Ambulatory Visit: Payer: Self-pay | Admitting: Internal Medicine

## 2018-03-09 NOTE — Telephone Encounter (Signed)
Done erx 

## 2018-03-17 ENCOUNTER — Telehealth: Payer: Self-pay

## 2018-03-17 DIAGNOSIS — E118 Type 2 diabetes mellitus with unspecified complications: Secondary | ICD-10-CM | POA: Diagnosis not present

## 2018-03-17 NOTE — Telephone Encounter (Signed)
Key [id number]

## 2018-03-20 ENCOUNTER — Other Ambulatory Visit: Payer: Self-pay | Admitting: Internal Medicine

## 2018-03-20 DIAGNOSIS — I10 Essential (primary) hypertension: Secondary | ICD-10-CM

## 2018-03-20 NOTE — Telephone Encounter (Signed)
Tiffany [name] from [facility] calling to verbalize this medication was approved for 1 year on 10/22.

## 2018-05-01 ENCOUNTER — Other Ambulatory Visit: Payer: Self-pay | Admitting: Family Medicine

## 2018-05-01 MED ORDER — TIZANIDINE HCL 4 MG PO TABS: ORAL_TABLET | ORAL | 0 refills | Status: DC

## 2018-05-01 NOTE — Telephone Encounter (Signed)
Copied from [place] [phone]. Topic: Quick Communication - Rx Refill/Question >> May 01, 2018 10:04 AM Rayann Heman wrote: Medication: tiZANidine (ZANAFLEX) 4 MG tablet [[id number]]   Has the patient contacted their pharmacy? No Preferred Pharmacy (with phone number or street name):[facility], [place] - [place] [phone] (Phone) [phone] (Fax)  Agent: Please be advised that RX refills may take up to 3 business days. We ask that you follow-up with your pharmacy.

## 2018-05-01 NOTE — Telephone Encounter (Signed)
Requested medication (s) are due for refill today: yes  Requested medication (s) are on the active medication list: yes  Last refill:  02/17/17  Future visit scheduled: no  Notes to clinic:  no     Requested Prescriptions  Pending Prescriptions Disp Refills   tiZANidine (ZANAFLEX) 4 MG tablet 30 tablet 0    Sig: TAKE 1 TABLET BY MOUTH EVERY 8 HOURS AS NEEDED FOR MUSCLE SPASM     Not Delegated - Cardiovascular:  Alpha-2 Agonists - tizanidine Failed - 05/01/2018 10:10 AM      Failed - This refill cannot be delegated      Passed - Valid encounter within last 6 months    Recent Outpatient Visits          5 months ago Diabetes mellitus type 2, uncontrolled, without complications (no retinopathy)   [place] John, James W, MD   5 months ago Left arm pain   [place], [place], DO   7 months ago Diabetes mellitus type 2, uncontrolled, without complications (no retinopathy)   [place] John, James W, MD   8 months ago Clavicle enlargement   [place], [place], DO   8 months ago Cervical radiculopathy at [facility] -Georges Mouse, MD      Future Appointments            In 1 week Jenny Reichmann, Hunt Oris, MD [place], [facility]

## 2018-05-07 DIAGNOSIS — E119 Type 2 diabetes mellitus without complications: Secondary | ICD-10-CM | POA: Diagnosis not present

## 2018-05-07 DIAGNOSIS — H25813 Combined forms of age-related cataract, bilateral: Secondary | ICD-10-CM | POA: Diagnosis not present

## 2018-05-12 ENCOUNTER — Ambulatory Visit: Payer: Medicare Other | Admitting: Podiatry

## 2018-05-13 ENCOUNTER — Other Ambulatory Visit: Payer: Self-pay | Admitting: Internal Medicine

## 2018-05-13 DIAGNOSIS — Z1231 Encounter for screening mammogram for malignant neoplasm of breast: Secondary | ICD-10-CM

## 2018-05-14 ENCOUNTER — Encounter: Payer: Self-pay | Admitting: Internal Medicine

## 2018-05-14 ENCOUNTER — Other Ambulatory Visit (INDEPENDENT_AMBULATORY_CARE_PROVIDER_SITE_OTHER): Payer: Medicare Other

## 2018-05-14 ENCOUNTER — Ambulatory Visit: Payer: Medicare Other | Admitting: Internal Medicine

## 2018-05-14 ENCOUNTER — Telehealth: Payer: Self-pay

## 2018-05-14 VITALS — BP 136/88 | HR 95 | Temp 98.3°F | Ht 62.0 in | Wt 143.0 lb

## 2018-05-14 DIAGNOSIS — Z23 Encounter for immunization: Secondary | ICD-10-CM | POA: Diagnosis not present

## 2018-05-14 DIAGNOSIS — G8929 Other chronic pain: Secondary | ICD-10-CM

## 2018-05-14 DIAGNOSIS — E1165 Type 2 diabetes mellitus with hyperglycemia: Secondary | ICD-10-CM | POA: Diagnosis not present

## 2018-05-14 DIAGNOSIS — M545 Low back pain, unspecified: Secondary | ICD-10-CM | POA: Insufficient documentation

## 2018-05-14 DIAGNOSIS — IMO0001 Reserved for inherently not codable concepts without codable children: Secondary | ICD-10-CM

## 2018-05-14 DIAGNOSIS — Z0000 Encounter for general adult medical examination without abnormal findings: Secondary | ICD-10-CM | POA: Diagnosis not present

## 2018-05-14 LAB — HEPATIC FUNCTION PANEL
ALT: 17 U/L (ref 0–35)
AST: 15 U/L (ref 0–37)
Albumin: 4.3 g/dL (ref 3.5–5.2)
Alkaline Phosphatase: 75 U/L (ref 39–117)
Bilirubin, Direct: 0 mg/dL (ref 0.0–0.3)
Total Bilirubin: 0.3 mg/dL (ref 0.2–1.2)
Total Protein: 8.9 g/dL — ABNORMAL HIGH (ref 6.0–8.3)

## 2018-05-14 LAB — CBC WITH DIFFERENTIAL/PLATELET
BASOS PCT: 1.4 % (ref 0.0–3.0)
Basophils Absolute: 0 10*3/uL (ref 0.0–0.1)
EOS ABS: 0.1 10*3/uL (ref 0.0–0.7)
Eosinophils Relative: 2.8 % (ref 0.0–5.0)
HCT: 42.3 % (ref 36.0–46.0)
Hemoglobin: 14 g/dL (ref 12.0–15.0)
Lymphocytes Relative: 28.5 % (ref 12.0–46.0)
Lymphs Abs: 1 10*3/uL (ref 0.7–4.0)
MCHC: 33.1 g/dL (ref 30.0–36.0)
MCV: 87.6 fl (ref 78.0–100.0)
Monocytes Absolute: 0.3 10*3/uL (ref 0.1–1.0)
Monocytes Relative: 7.7 % (ref 3.0–12.0)
Neutro Abs: 2.1 10*3/uL (ref 1.4–7.7)
Neutrophils Relative %: 59.6 % (ref 43.0–77.0)
Platelets: 245 10*3/uL (ref 150.0–400.0)
RBC: 4.83 Mil/uL (ref 3.87–5.11)
RDW: 13.9 % (ref 11.5–15.5)
WBC: 3.5 10*3/uL — ABNORMAL LOW (ref 4.0–10.5)

## 2018-05-14 LAB — URINALYSIS, ROUTINE W REFLEX MICROSCOPIC
Bilirubin Urine: NEGATIVE
Hgb urine dipstick: NEGATIVE
Ketones, ur: NEGATIVE
Nitrite: NEGATIVE
RBC / HPF: NONE SEEN (ref 0–?)
Specific Gravity, Urine: 1.01 (ref 1.000–1.030)
Total Protein, Urine: NEGATIVE
Urine Glucose: NEGATIVE
Urobilinogen, UA: 0.2 (ref 0.0–1.0)
pH: 6 (ref 5.0–8.0)

## 2018-05-14 LAB — BASIC METABOLIC PANEL
BUN: 15 mg/dL (ref 6–23)
CO2: 27 mEq/L (ref 19–32)
Calcium: 9.5 mg/dL (ref 8.4–10.5)
Chloride: 104 mEq/L (ref 96–112)
Creatinine, Ser: 0.66 mg/dL (ref 0.40–1.20)
GFR: 114.94 mL/min (ref >60.00)
GLUCOSE: 78 mg/dL (ref 70–99)
Potassium: 4 mEq/L (ref 3.5–5.1)
Sodium: 139 mEq/L (ref 135–145)

## 2018-05-14 LAB — LIPID PANEL
Cholesterol: 183 mg/dL (ref 0–200)
HDL: 55.4 mg/dL (ref >39.00)
LDL Cholesterol: 116 mg/dL — ABNORMAL HIGH (ref 0–99)
NONHDL: 127.85
Total CHOL/HDL Ratio: 3
Triglycerides: 59 mg/dL (ref 0.0–149.0)
VLDL: 11.8 mg/dL (ref 0.0–40.0)

## 2018-05-14 LAB — MICROALBUMIN / CREATININE URINE RATIO
Creatinine,U: 41.9 mg/dL
Microalb Creat Ratio: 1.7 mg/g (ref 0.0–30.0)
Microalb, Ur: <0.7 mg/dL (ref 0.0–1.9)

## 2018-05-14 LAB — HEMOGLOBIN A1C: HEMOGLOBIN A1C: 8.5 % — AB (ref 4.6–6.5)

## 2018-05-14 LAB — TSH: TSH: 2.11 u[IU]/mL (ref 0.35–4.50)

## 2018-05-14 MED ORDER — EPINEPHRINE 0.3 MG/0.3ML IJ SOAJ: 0.3000 mg | Freq: Once | INTRAMUSCULAR | 1 refills | Status: AC

## 2018-05-14 MED ORDER — GLUCOSE BLOOD VI STRP: 1.0000 | ORAL_STRIP | Freq: Three times a day (TID) | 11 refills | Status: DC

## 2018-05-14 MED ORDER — ZOLPIDEM TARTRATE 10 MG PO TABS: 10.0000 mg | ORAL_TABLET | Freq: Every evening | ORAL | 3 refills | Status: DC | PRN

## 2018-05-14 MED ORDER — TIZANIDINE HCL 4 MG PO TABS: ORAL_TABLET | ORAL | 5 refills | Status: DC

## 2018-05-14 MED ORDER — ACCU-CHEK SOFTCLIX LANCETS MISC: 11 refills | Status: DC

## 2018-05-14 MED ORDER — EPINEPHRINE 0.15 MG/0.15ML IJ SOAJ: 0.1500 mg | INTRAMUSCULAR | 1 refills | Status: DC | PRN

## 2018-05-14 NOTE — Assessment & Plan Note (Signed)
stable overall by history and exam, recent data reviewed with pt, and pt to continue medical treatment as before,  to f/u any worsening symptoms or concerns  

## 2018-05-14 NOTE — Progress Notes (Signed)
Subjective:    Patient ID: Tiffany [name], female    DOB: 1950/08/18, 67 y.o.   MRN: [id number]  HPI   Here for wellness and f/u;  Overall doing ok;  Pt denies Chest pain, worsening SOB, DOE, wheezing, orthopnea, PND, worsening LE edema, palpitations, dizziness or syncope.  Pt denies neurological change such as new headache, facial or extremity weakness.  Pt denies polydipsia, polyuria, or low sugar symptoms. Pt states overall good compliance with treatment and medications, good tolerability, and has been trying to follow appropriate diet.  Pt denies worsening depressive symptoms, suicidal ideation or panic. No fever, night sweats, wt loss, loss of appetite, or other constitutional symptoms.  Pt states good ability with ADL's, has low fall risk, home safety reviewed and adequate, no other significant changes in hearing or vision, and only occasionally active with exercise.  Pt continues to have recurring LBP without change in severity, bowel or bladder change, fever, wt loss,  worsening LE pain/numbness/weakness, gait change or falls.  Asks for epipen [facility], likely has allergy to seafood.   Past Medical History:  Diagnosis Date  . Degenerative joint disease   . Diabetes mellitus   . Headache   . Hyperlipidemia   . Hypertension   . Sleep apnea    stopped using it   Past Surgical History:  Procedure Laterality Date  . ABDOMINAL HYSTERECTOMY  1976  . BACK SURGERY    . KNEE ARTHROSCOPY     left x 2  . [place]    reports that she quit smoking about 27 years ago. She has a 7.50 pack-year smoking history. She has never used smokeless tobacco. She reports that she does not drink alcohol or use drugs. family history includes Diabetes in her mother and sister; Heart disease in her brother and mother; Hypertension in her brother and mother; Kidney disease in her mother; Stroke in her brother; Thyroid disease in her mother. Allergies  Allergen  Reactions  . Aspirin Nausea And Vomiting    Can take EC asa  . Contrast Media [Iodinated Diagnostic Agents] Nausea And Vomiting and Swelling    Patients throat closes with nausea and vomiting   . Gadolinium Derivatives Nausea And Vomiting  . Metformin And Related Nausea And Vomiting    Severe nausea and vomiting  . Shellfish Allergy Nausea And Vomiting  . Tramadol Nausea And Vomiting and Other (See Comments)    Migraine  . Penicillins   . Statins Other (See Comments)    myalgias   Current Outpatient Medications on File Prior to Visit  Medication Sig Dispense Refill  . amLODipine (NORVASC) 5 MG tablet TAKE 1 TABLET (5 MG TOTAL) BY MOUTH DAILY. 90 tablet 1  . Blood Glucose Monitoring Suppl (ACCU-CHEK NANO SMARTVIEW) w/Device KIT Use to check blood sugar as directed up to 3 times a day. 1 kit 0  . Dulaglutide (TRULICITY) 1.91 YN/8.2NF SOPN 0.5 ml subq weekly 6 mL 3  . ezetimibe (ZETIA) 10 MG tablet Take 1 tablet (10 mg total) by mouth daily. 90 tablet 3  . gabapentin (NEURONTIN) 100 MG capsule Take 1 capsule (100 mg total) by mouth 2 (two) times daily. 60 capsule 3  . gabapentin (NEURONTIN) 400 MG capsule TAKE 1 CAPSULE BY MOUTH THREE TIMES DAILY 270 capsule 1  . glimepiride (AMARYL) 4 MG tablet Take 1 tablet (4 mg total) by mouth daily with breakfast. Follow-up appt due in January must see provider for future  refills 90 tablet 0  . Insulin Glargine (LANTUS SOLOSTAR) 100 UNIT/ML Solostar Pen Inject 55 Units into the skin daily. 45 mL 3  . Insulin Pen Needle 32G X 4 MM MISC Use to inject  lantus once daily 100 each 4  . quinapril (ACCUPRIL) 40 MG tablet Take 1 tablet (40 mg total) by mouth daily. Follow-up appt due in January must see provider for future refills 90 tablet 0  . ranitidine (ZANTAC) 150 MG tablet Take 150 mg by mouth 2 (two) times daily as needed for heartburn.    . Vitamin D, Ergocalciferol, (DRISDOL) 50000 units CAPS capsule Take 1 capsule (50,000 Units total) by mouth every 7  (seven) days. 12 capsule 0   No current facility-administered medications on file prior to visit.    Review of Systems Constitutional: Negative for other unusual diaphoresis, sweats, appetite or weight changes HENT: Negative for other worsening hearing loss, ear pain, facial swelling, mouth sores or neck stiffness.   Eyes: Negative for other worsening pain, redness or other visual disturbance.  Respiratory: Negative for other stridor or swelling Cardiovascular: Negative for other palpitations or other chest pain  Gastrointestinal: Negative for worsening diarrhea or loose stools, blood in stool, distention or other pain Genitourinary: Negative for hematuria, flank pain or other change in urine volume.  Musculoskeletal: Negative for myalgias or other joint swelling.  Skin: Negative for other color change, or other wound or worsening drainage.  Neurological: Negative for other syncope or numbness. Hematological: Negative for other adenopathy or swelling Psychiatric/Behavioral: Negative for hallucinations, other worsening agitation, SI, self-injury, or new decreased concentration ALl other system neg per pt    Objective:   Physical Exam BP 136/88   Pulse 95   Temp 98.3 F (36.8 C) (Oral)   Ht 5' 2"  (1.575 m)   Wt 143 lb (64.9 kg)   SpO2 96%   BMI 26.16 kg/m  VS noted,  Constitutional: Pt is oriented to person, place, and time. Appears well-developed and well-nourished, in no significant distress and comfortable Head: Normocephalic and atraumatic  Eyes: Conjunctivae and EOM are normal. Pupils are equal, round, and reactive to light Right Ear: External ear normal without discharge Left Ear: External ear normal without discharge Nose: Nose without discharge or deformity Mouth/Throat: Oropharynx is without other ulcerations and moist  Neck: Normal range of motion. Neck supple. No JVD present. No tracheal deviation present or significant neck LA or mass Cardiovascular: Normal rate,  regular rhythm, normal heart sounds and intact distal pulses.   Pulmonary/Chest: WOB normal and breath sounds without rales or wheezing  Abdominal: Soft. Bowel sounds are normal. NT. No HSM  Musculoskeletal: Normal range of motion. Exhibits no edema Lymphadenopathy: Has no other cervical adenopathy.  Neurological: Pt is alert and oriented to person, place, and time. Pt has normal reflexes. No cranial nerve deficit. Motor grossly intact, Gait intact Skin: Skin is warm and dry. No rash noted or new ulcerations Psychiatric:  Has normal mood and affect. Behavior is normal without agitation No other exam findings Lab Results  Component Value Date   WBC 3.5 (L) 05/14/2018   HGB 14.0 05/14/2018   HCT 42.3 05/14/2018   PLT 245.0 05/14/2018   GLUCOSE 78 05/14/2018   CHOL 183 05/14/2018   TRIG 59.0 05/14/2018   HDL 55.40 05/14/2018   LDLCALC 116 (H) 05/14/2018   ALT 17 05/14/2018   AST 15 05/14/2018   NA 139 05/14/2018   K 4.0 05/14/2018   CL 104 05/14/2018   CREATININE  0.66 05/14/2018   BUN 15 05/14/2018   CO2 27 05/14/2018   TSH 2.11 05/14/2018   HGBA1C 8.5 (H) 05/14/2018   MICROALBUR <0.7 05/14/2018       Assessment & Plan:

## 2018-05-14 NOTE — Addendum Note (Signed)
Addended by: Biagio Borg on: 05/14/2018 04:43 PM   Modules accepted: Orders

## 2018-05-14 NOTE — Telephone Encounter (Signed)
Pharmacy called in regard to the Epi-Pen that was sent in for this patient today. The pharmacist stated that it was sent in for a childs dose. Was that correct or does it need to be send again with the correct quantity? Please advise.

## 2018-05-14 NOTE — Assessment & Plan Note (Signed)
[place] for ,muscle relaxer prn,.  to f/u any worsening symptoms or concerns

## 2018-05-14 NOTE — Patient Instructions (Addendum)
You had the Prevnar 13 pneumonia shot today  Please take all new medication as prescribed - the epipen  Please continue all other medications as before, and refills have been done if requested - the muscle relaxer  Please have the pharmacy call with any other refills you may need.  Please continue your efforts at being more active, low cholesterol diet, and weight control.  You are otherwise up to date with prevention measures today.  Please keep your appointments with your specialists as you may have planned  Please go to the LAB in the Basement (turn left off the elevator) for the tests to be done today  You will be contacted by phone if any changes need to be made immediately.  Otherwise, you will receive a letter about your results with an explanation, but please check with MyChart first.  Please remember to sign up for MyChart if you have not done so, as this will be important to you in the future with finding out test results, communicating by private email, and scheduling acute appointments online when needed.  Please return in 6 months, or sooner if needed, with Lab testing done 3-5 days before

## 2018-05-14 NOTE — Telephone Encounter (Signed)
rx corrected 

## 2018-05-14 NOTE — Assessment & Plan Note (Signed)

## 2018-05-17 DIAGNOSIS — E119 Type 2 diabetes mellitus without complications: Secondary | ICD-10-CM | POA: Diagnosis not present

## 2018-05-17 DIAGNOSIS — E118 Type 2 diabetes mellitus with unspecified complications: Secondary | ICD-10-CM | POA: Diagnosis not present

## 2018-05-19 ENCOUNTER — Encounter: Payer: Self-pay | Admitting: Internal Medicine

## 2018-05-19 ENCOUNTER — Other Ambulatory Visit: Payer: Self-pay | Admitting: Internal Medicine

## 2018-05-19 MED ORDER — DULAGLUTIDE 1.5 MG/0.5ML ~~LOC~~ SOAJ: 0.5000 mL | SUBCUTANEOUS | 3 refills | Status: DC

## 2018-05-21 ENCOUNTER — Telehealth: Payer: Self-pay

## 2018-05-21 MED ORDER — INSULIN GLARGINE 100 UNIT/ML SOLOSTAR PEN: 55.0000 [IU] | PEN_INJECTOR | Freq: Every day | SUBCUTANEOUS | 1 refills | Status: DC

## 2018-05-21 NOTE — Telephone Encounter (Signed)
Called pt, LVM.   CRM created.  

## 2018-05-21 NOTE — Addendum Note (Signed)
Addended by: Juliet Rude on: 05/21/2018 10:19 AM   Modules accepted: Orders

## 2018-05-21 NOTE — Telephone Encounter (Signed)
It would appear she still has refills of lantus at [facility] so can fill there. I would recommend to start trulicity as well as per PCP advice. Back pain can take tylenol over the counter or make visit for evaluation.

## 2018-05-21 NOTE — Telephone Encounter (Signed)
Please advise in PCP absence.   Pt states that she did not start Trulicity. Should she pick up that script? Or would be it ok to send in a refill of Lantus?  Also, can something be sent if for her back pain or would an OV be needed to address?     Notes recorded by Marcello Moores, RN on 05/21/2018 at 9:43 AM EST Pt. Given results and instructions. States she never started Trulicity. Does still use Lantus pen."I need a refill." Also requesting something for back pain be sent to her pharmacy. Please advise pt. In regard to medications.    Notes recorded by Biagio Borg, MD on 05/19/2018 at 12:36 PM EST Left message on MyChart, pt to cont same tx except  The test results show that your current treatment is OK, except the A1c is still too high. Please increase the Trulicity to 1.5 from the 0.75 you are now taking. I will send a new prescription, and you should hear from the office as well.  Dontrae Morini to please inform pt, I will do rx

## 2018-05-21 NOTE — Telephone Encounter (Signed)
-----   Message from Biagio Borg, MD sent at 05/19/2018 12:36 PM EST ----- Left message on MyChart, pt to cont same tx except  The test results show that your current treatment is OK, except the A1c is still too high.  Please increase the Trulicity to 1.5 from the 0.75 you are now taking.  I will send a new prescription, and you should hear from the office as well.  Magin Balbi to please inform pt, I will do rx

## 2018-05-21 NOTE — Telephone Encounter (Addendum)
Pt has been informed and expressed understanding.  

## 2018-05-22 ENCOUNTER — Ambulatory Visit
Admission: RE | Admit: 2018-05-22 | Discharge: 2018-05-22 | Disposition: A | Discharge status: Home / Self Care | Payer: Medicare Other | Source: Ambulatory Visit | Attending: Internal Medicine | Admitting: Internal Medicine

## 2018-05-22 DIAGNOSIS — Z1231 Encounter for screening mammogram for malignant neoplasm of breast: Secondary | ICD-10-CM

## 2018-05-26 ENCOUNTER — Other Ambulatory Visit: Payer: Self-pay | Admitting: Internal Medicine

## 2018-05-26 DIAGNOSIS — R928 Other abnormal and inconclusive findings on diagnostic imaging of breast: Secondary | ICD-10-CM

## 2018-06-03 ENCOUNTER — Ambulatory Visit
Admission: RE | Admit: 2018-06-03 | Discharge: 2018-06-03 | Disposition: A | Discharge status: Home / Self Care | Payer: Medicare Other | Source: Ambulatory Visit | Attending: Internal Medicine | Admitting: Internal Medicine

## 2018-06-03 ENCOUNTER — Other Ambulatory Visit: Payer: Self-pay | Admitting: Internal Medicine

## 2018-06-03 DIAGNOSIS — R928 Other abnormal and inconclusive findings on diagnostic imaging of breast: Secondary | ICD-10-CM

## 2018-06-03 DIAGNOSIS — N6489 Other specified disorders of breast: Secondary | ICD-10-CM | POA: Diagnosis not present

## 2018-06-03 DIAGNOSIS — N6001 Solitary cyst of right breast: Secondary | ICD-10-CM | POA: Diagnosis not present

## 2018-06-03 DIAGNOSIS — N6310 Unspecified lump in the right breast, unspecified quadrant: Secondary | ICD-10-CM

## 2018-06-05 ENCOUNTER — Ambulatory Visit
Admission: RE | Admit: 2018-06-05 | Discharge: 2018-06-05 | Disposition: A | Discharge status: Home / Self Care | Payer: Medicare Other | Source: Ambulatory Visit | Attending: Internal Medicine | Admitting: Internal Medicine

## 2018-06-05 ENCOUNTER — Other Ambulatory Visit: Payer: Self-pay | Admitting: Internal Medicine

## 2018-06-05 DIAGNOSIS — N6031 Fibrosclerosis of right breast: Secondary | ICD-10-CM | POA: Diagnosis not present

## 2018-06-05 DIAGNOSIS — N6311 Unspecified lump in the right breast, upper outer quadrant: Secondary | ICD-10-CM | POA: Diagnosis not present

## 2018-06-05 DIAGNOSIS — N6310 Unspecified lump in the right breast, unspecified quadrant: Secondary | ICD-10-CM

## 2018-06-14 ENCOUNTER — Other Ambulatory Visit: Payer: Self-pay | Admitting: Internal Medicine

## 2018-06-14 DIAGNOSIS — I10 Essential (primary) hypertension: Secondary | ICD-10-CM

## 2018-06-23 ENCOUNTER — Telehealth: Payer: Self-pay

## 2018-06-23 NOTE — Telephone Encounter (Signed)
Key: [id number]

## 2018-06-24 MED ORDER — SEMAGLUTIDE(0.25 OR 0.5MG/DOS) 2 MG/1.5ML ~~LOC~~ SOPN: 0.5000 mg | PEN_INJECTOR | SUBCUTANEOUS | 11 refills | Status: DC

## 2018-06-24 NOTE — Telephone Encounter (Signed)
Ok to change trulicity to ozempic - done erx

## 2018-06-24 NOTE — Addendum Note (Signed)
Addended by: Biagio Borg on: 06/24/2018 05:16 PM   Modules accepted: Orders

## 2018-06-24 NOTE — Telephone Encounter (Addendum)
Per denial the patient must try and fail one of the following before Trulicity will be covered.  Bydureon Ozempic Victoza   Please advise.  Denial sent to scan.

## 2018-06-24 NOTE — Telephone Encounter (Signed)
Darryl with [employer] called and stated pt's Dulaglutide (TRULICITY) 1.5 XB/2.8UX SOPN  was denied because there were not other rx's that were tried before prescribing it. Faxing over paperwork regarding this. Please advise.

## 2018-06-25 NOTE — Telephone Encounter (Signed)
Tiffany [name] has received the standard appeal for trulicity and can take up to 7 days for an answer. Follow up with be made by analyst if additional info is needed ok to fax to [phone]

## 2018-06-25 NOTE — Telephone Encounter (Addendum)
Appeal for Trulicity submitted

## 2018-06-29 NOTE — Telephone Encounter (Signed)
Medication has been approved after the appeals process and is covered through 06/24/2019.

## 2018-07-03 ENCOUNTER — Telehealth: Payer: Self-pay | Admitting: Internal Medicine

## 2018-07-03 NOTE — Telephone Encounter (Signed)
Copied from [place] [phone]. Topic: Quick Communication - See Telephone Encounter >> Jul 03, 2018 12:14 PM Rutherford Nail, NT wrote: CRM for notification. See Telephone encounter for: 07/03/18. Jeannie Done with [employer] and states that the Semaglutide,0.25 or 0.5MG /DOS, (OZEMPIC, 0.25 OR 0.5 MG/DOSE,) 2 MG/1.5ML SOPN was approved from 07/03/2018 thru 07/04/2019.

## 2018-07-08 DIAGNOSIS — E119 Type 2 diabetes mellitus without complications: Secondary | ICD-10-CM | POA: Diagnosis not present

## 2018-07-13 ENCOUNTER — Telehealth: Payer: Self-pay

## 2018-07-13 NOTE — Telephone Encounter (Signed)
Ok to let pt know that her lantus is listed at 55 units per day, but also should be taking glimeparide and trulicity for sugar as well

## 2018-07-13 NOTE — Telephone Encounter (Signed)
Dr. Jenny Reichmann please advise.   Copied from [place] [phone]. Topic: General - Other >> Jul 13, 2018  3:08 PM Carolyn Stare wrote:  Pt req a call back to discuss her insulin she is not sure what she should be taking

## 2018-07-14 NOTE — Telephone Encounter (Signed)
Pt has been informed of results and expressed understanding.  °

## 2018-09-02 ENCOUNTER — Other Ambulatory Visit: Payer: Self-pay | Admitting: Internal Medicine

## 2018-11-16 ENCOUNTER — Encounter: Payer: Self-pay | Admitting: Internal Medicine

## 2018-11-16 ENCOUNTER — Other Ambulatory Visit: Payer: Self-pay

## 2018-11-16 ENCOUNTER — Telehealth: Payer: Self-pay | Admitting: Internal Medicine

## 2018-11-16 ENCOUNTER — Ambulatory Visit (INDEPENDENT_AMBULATORY_CARE_PROVIDER_SITE_OTHER): Payer: Medicare Other | Admitting: Internal Medicine

## 2018-11-16 ENCOUNTER — Ambulatory Visit (HOSPITAL_COMMUNITY)
Admission: RE | Admit: 2018-11-16 | Discharge: 2018-11-16 | Disposition: A | Discharge status: Home / Self Care | Payer: Medicare Other | Source: Ambulatory Visit | Attending: Cardiovascular Disease | Admitting: Cardiovascular Disease

## 2018-11-16 VITALS — BP 148/88 | HR 94 | Temp 98.6°F | Ht 62.0 in | Wt 159.0 lb

## 2018-11-16 DIAGNOSIS — M7989 Other specified soft tissue disorders: Secondary | ICD-10-CM | POA: Insufficient documentation

## 2018-11-16 DIAGNOSIS — M25562 Pain in left knee: Secondary | ICD-10-CM

## 2018-11-16 DIAGNOSIS — E559 Vitamin D deficiency, unspecified: Secondary | ICD-10-CM

## 2018-11-16 DIAGNOSIS — IMO0001 Reserved for inherently not codable concepts without codable children: Secondary | ICD-10-CM

## 2018-11-16 DIAGNOSIS — E538 Deficiency of other specified B group vitamins: Secondary | ICD-10-CM

## 2018-11-16 DIAGNOSIS — Z0000 Encounter for general adult medical examination without abnormal findings: Secondary | ICD-10-CM

## 2018-11-16 DIAGNOSIS — I10 Essential (primary) hypertension: Secondary | ICD-10-CM

## 2018-11-16 DIAGNOSIS — E1165 Type 2 diabetes mellitus with hyperglycemia: Secondary | ICD-10-CM | POA: Diagnosis not present

## 2018-11-16 DIAGNOSIS — E611 Iron deficiency: Secondary | ICD-10-CM

## 2018-11-16 LAB — POCT GLYCOSYLATED HEMOGLOBIN (HGB A1C): Hemoglobin A1C: 7.8 % — AB (ref 4.0–5.6)

## 2018-11-16 NOTE — Telephone Encounter (Signed)
[facility] called stating Patients LE Venous, (DVT) was negative.

## 2018-11-16 NOTE — Assessment & Plan Note (Signed)
For Vit D lab with next draw

## 2018-11-16 NOTE — Assessment & Plan Note (Signed)
stable overall by history and exam, recent data reviewed with pt, and pt to continue medical treatment as before,  to f/u any worsening symptoms or concerns  

## 2018-11-16 NOTE — Assessment & Plan Note (Signed)
Cant r/o dvt - for LLE venous doppler

## 2018-11-16 NOTE — Assessment & Plan Note (Signed)
Improved over previous, declines med change, to continue to work on diet and med compliance,  to f/u any worsening symptoms or concerns

## 2018-11-16 NOTE — Assessment & Plan Note (Signed)
I suspect ligamentous injury - for MRI, and refer orthopedic

## 2018-11-16 NOTE — Patient Instructions (Addendum)
Your A1c was Ok today  Please continue all other medications as before, and refills have been done if requested.  Please have the pharmacy call with any other refills you may need.  Please continue your efforts at being more active, low cholesterol diet, and weight control.  You are otherwise up to date with prevention measures today.  Please keep your appointments with your specialists as you may have planned  You will be contacted regarding the referral for: vein test for the left leg (to see [facility] now)  You will be contacted regarding the referral for: MRI for the left knee, and Orthopedic referral  Please return in 6 months, or sooner if needed, with Lab testing done 3-5 days before

## 2018-11-16 NOTE — Progress Notes (Signed)
Subjective:    Patient ID: Tiffany [name], female    DOB: 09-Nov-1950, 68 y.o.   MRN: [id number]  HPI  Here with c/o left knee and distal LLE pain and swelling for several weeks; she is not sure but has actually accidentally hit the knee at the patellar tendon aspect which does not really hurt or swelling now, but primarily to the lateral aspect of the knee at about the joint line.  Also has had worsening LLE swelling and calf discomfort maybe worse in the last few day.  Knee is unstable and lax to her with a tendency for the distal lower leg to evert when the left upper leg inverts with walking.  No falls. Pt denies chest pain, increased sob or doe, wheezing, orthopnea, PND, increased LE swelling, palpitations, dizziness or syncope.  Pt denies new neurological symptoms such as new headache, or facial or extremity weakness or numbness   Pt denies polydipsia, polyuria   Has had several recent low sugars with addmitted variable diet and taking of meds , plans to do better with her compliance with diet and meds. Past Medical History:  Diagnosis Date  . Degenerative joint disease   . Diabetes mellitus   . Headache   . Hyperlipidemia   . Hypertension   . Sleep apnea    stopped using it   Past Surgical History:  Procedure Laterality Date  . ABDOMINAL HYSTERECTOMY  1976  . BACK SURGERY    . KNEE ARTHROSCOPY     left x 2  . [place]    reports that she quit smoking about 28 years ago. She has a 7.50 pack-year smoking history. She has never used smokeless tobacco. She reports that she does not drink alcohol or use drugs. family history includes Diabetes in her mother and sister; Heart disease in her brother and mother; Hypertension in her brother and mother; Kidney disease in her mother; Stroke in her brother; Thyroid disease in her mother. Allergies  Allergen Reactions  . Aspirin Nausea And Vomiting    Can take EC asa  . Contrast Media [Iodinated Diagnostic Agents] Nausea And  Vomiting and Swelling    Patients throat closes with nausea and vomiting   . Gadolinium Derivatives Nausea And Vomiting  . Metformin And Related Nausea And Vomiting    Severe nausea and vomiting  . Shellfish Allergy Nausea And Vomiting  . Tramadol Nausea And Vomiting and Other (See Comments)    Migraine  . Penicillins   . Statins Other (See Comments)    myalgias   Current Outpatient Medications on File Prior to Visit  Medication Sig Dispense Refill  . ACCU-CHEK SOFTCLIX LANCETS lancets USE TO CHECK BLOOD SUGARS TWO TIMES DAILY. 300 each 11  . amLODipine (NORVASC) 5 MG tablet TAKE 1 TABLET (5 MG TOTAL) BY MOUTH DAILY. 90 tablet 3  . Blood Glucose Monitoring Suppl (ACCU-CHEK NANO SMARTVIEW) w/Device KIT Use to check blood sugar as directed up to 3 times a day. 1 kit 0  . ezetimibe (ZETIA) 10 MG tablet Take 1 tablet (10 mg total) by mouth daily. 90 tablet 3  . gabapentin (NEURONTIN) 400 MG capsule TAKE 1 CAPSULE BY MOUTH THREE TIMES DAILY 270 capsule 0  . glimepiride (AMARYL) 4 MG tablet Take 1 tablet (4 mg total) by mouth daily with breakfast. 90 tablet 3  . glucose blood (ACCU-CHEK AVIVA PLUS) test strip 1 each by Other route 3 (three) times daily. Use to check blood sugars three times  a day Dx E11.8 300 each 11  . Insulin Glargine (LANTUS SOLOSTAR) 100 UNIT/ML Solostar Pen Inject 55 Units into the skin daily. 45 mL 1  . Insulin Pen Needle 32G X 4 MM MISC Use to inject  lantus once daily 100 each 4  . quinapril (ACCUPRIL) 40 MG tablet Take 1 tablet (40 mg total) by mouth daily. 90 tablet 3  . ranitidine (ZANTAC) 150 MG tablet Take 150 mg by mouth 2 (two) times daily as needed for heartburn.    . Semaglutide,0.25 or 0.5MG/DOS, (OZEMPIC, 0.25 OR 0.5 MG/DOSE,) 2 MG/1.5ML SOPN Inject 0.5 mg into the skin once a week. 1 pen 11  . tiZANidine (ZANAFLEX) 4 MG tablet TAKE 1 TABLET BY MOUTH EVERY 8 HOURS AS NEEDED FOR MUSCLE SPASM 90 tablet 5  . Vitamin D, Ergocalciferol, (DRISDOL) 50000 units CAPS  capsule Take 1 capsule (50,000 Units total) by mouth every 7 (seven) days. 12 capsule 0  . zolpidem (AMBIEN) 10 MG tablet Take 1 tablet (10 mg total) by mouth at bedtime as needed. 90 tablet 3   No current facility-administered medications on file prior to visit.    Review of Systems,   Constitutional: Negative for other unusual diaphoresis or sweats HENT: Negative for ear discharge or swelling Eyes: Negative for other worsening visual disturbances Respiratory: Negative for stridor or other swelling  Gastrointestinal: Negative for worsening distension or other blood Genitourinary: Negative for retention or other urinary change Musculoskeletal: Negative for other MSK pain or swelling Skin: Negative for color change or other new lesions Neurological: Negative for worsening tremors and other numbness  Psychiatric/Behavioral: Negative for worsening agitation or other fatigue All other system neg per pt    Objective:   Physical Exam BP (!) 148/88 (BP Location: Left Arm, Patient Position: Sitting, Cuff Size: Large)   Pulse 94   Temp 98.6 F (37 C) (Oral)   Ht 5' 2"  (1.575 m)   Wt 159 lb (72.1 kg)   SpO2 97%   BMI 29.08 kg/m  VS noted,  Constitutional: Pt appears in NAD HENT: Head: NCAT.  Right Ear: External ear normal.  Left Ear: External ear normal.  Eyes: . Pupils are equal, round, and reactive to light. Conjunctivae and EOM are normal Nose: without d/c or deformity Neck: Neck supple. Gross normal ROM Cardiovascular: Normal rate and regular rhythm.   Pulmonary/Chest: Effort normal and breath sounds without rales or wheezing.  Abd:  Soft, NT, ND, + BS, no organomegaly Neurological: Pt is alert. At baseline orientation, motor grossly intact Skin: Skin is warm. No rashes, other new lesions, no LE edema Psychiatric: Pt behavior is normal without agitation  Left knee with lateral joint line swelling and tender\LLE below the knee with 1-2+ swelling No other exam findings  POCT  glycosylated hemoglobin (Hb A1C) Order: [id number] Status:  Final result Visible to patient:  No (not released) Dx:  Diabetes mellitus type 2, uncontrolle...  Ref Range & Units 15:36 (11/16/18) 66moago (05/14/18) 157mogo (11/18/17) 1y79yro (08/19/17) 66yr9yr (03/27/17) 89yr 96yr(03/06/16) 35yr a104yr5/8/17)  Hemoglobin A1C 4.0 - 5.6 % 7.8Abnormal   8.5High  R, CM  8.4High  R, CM  8.4High  R, CM  9.9High  R, CM  9.1High  R, CM  10.4High            Assessment & Plan:

## 2018-11-17 ENCOUNTER — Telehealth: Payer: Self-pay | Admitting: Internal Medicine

## 2018-11-17 MED ORDER — HYDROCODONE-ACETAMINOPHEN 5-325 MG PO TABS: 1.0000 | ORAL_TABLET | Freq: Four times a day (QID) | ORAL | 0 refills | Status: DC | PRN

## 2018-11-17 NOTE — Telephone Encounter (Signed)
Pt is calling and would like pain medication for her knee until she can get in to orthopaedic office. Pt is still waiting on an appt. [employer]

## 2018-11-17 NOTE — Telephone Encounter (Signed)
Done erx - hydrocodon 5 325 prn

## 2018-11-26 ENCOUNTER — Other Ambulatory Visit: Payer: Self-pay | Admitting: Internal Medicine

## 2018-11-27 ENCOUNTER — Other Ambulatory Visit: Payer: Self-pay | Admitting: Internal Medicine

## 2018-12-07 ENCOUNTER — Telehealth: Payer: Self-pay | Admitting: Internal Medicine

## 2018-12-07 MED ORDER — HYDROCODONE-ACETAMINOPHEN 5-325 MG PO TABS: 1.0000 | ORAL_TABLET | Freq: Four times a day (QID) | ORAL | 0 refills | Status: DC | PRN

## 2018-12-07 NOTE — Telephone Encounter (Signed)
Medication Refill - Medication: HYDROcodone-acetaminophen (NORCO/VICODIN) 5-325 MG tablet  Has the patient contacted their pharmacy?Yes  (Agent: If no, request that the patient contact the pharmacy for the refill.) (Agent: If yes, when and what did the pharmacy advise?)  Preferred Pharmacy (with phone number or street name): [facility], [place]: Please be advised that RX refills may take up to 3 business days. We ask that you follow-up with your pharmacy.

## 2018-12-07 NOTE — Telephone Encounter (Signed)
Done erx 

## 2018-12-07 NOTE — Addendum Note (Signed)
Addended by: Biagio Borg on: 12/07/2018 07:04 PM   Modules accepted: Orders

## 2018-12-10 ENCOUNTER — Ambulatory Visit
Admission: RE | Admit: 2018-12-10 | Discharge: 2018-12-10 | Disposition: A | Discharge status: Home / Self Care | Payer: Medicare Other | Source: Ambulatory Visit | Attending: Internal Medicine | Admitting: Internal Medicine

## 2018-12-10 ENCOUNTER — Other Ambulatory Visit: Payer: Self-pay

## 2018-12-10 DIAGNOSIS — M25562 Pain in left knee: Secondary | ICD-10-CM | POA: Diagnosis not present

## 2018-12-12 ENCOUNTER — Encounter: Payer: Self-pay | Admitting: Internal Medicine

## 2018-12-17 DIAGNOSIS — M25562 Pain in left knee: Secondary | ICD-10-CM | POA: Diagnosis not present

## 2019-01-12 ENCOUNTER — Other Ambulatory Visit: Payer: Self-pay | Admitting: Internal Medicine

## 2019-01-18 ENCOUNTER — Other Ambulatory Visit: Payer: Self-pay | Admitting: Internal Medicine

## 2019-02-24 ENCOUNTER — Other Ambulatory Visit: Payer: Self-pay | Admitting: Internal Medicine

## 2019-02-24 NOTE — Telephone Encounter (Signed)
Done erx 

## 2019-03-19 ENCOUNTER — Other Ambulatory Visit: Payer: Self-pay | Admitting: Internal Medicine

## 2019-04-12 ENCOUNTER — Other Ambulatory Visit (INDEPENDENT_AMBULATORY_CARE_PROVIDER_SITE_OTHER): Payer: Medicare Other

## 2019-04-12 DIAGNOSIS — Z0000 Encounter for general adult medical examination without abnormal findings: Secondary | ICD-10-CM | POA: Diagnosis not present

## 2019-04-12 DIAGNOSIS — E538 Deficiency of other specified B group vitamins: Secondary | ICD-10-CM

## 2019-04-12 DIAGNOSIS — E611 Iron deficiency: Secondary | ICD-10-CM | POA: Diagnosis not present

## 2019-04-12 DIAGNOSIS — E1165 Type 2 diabetes mellitus with hyperglycemia: Secondary | ICD-10-CM | POA: Diagnosis not present

## 2019-04-12 DIAGNOSIS — E559 Vitamin D deficiency, unspecified: Secondary | ICD-10-CM | POA: Diagnosis not present

## 2019-04-12 DIAGNOSIS — IMO0001 Reserved for inherently not codable concepts without codable children: Secondary | ICD-10-CM

## 2019-04-12 LAB — CBC WITH DIFFERENTIAL/PLATELET
Basophils Absolute: 0 10*3/uL (ref 0.0–0.1)
Basophils Relative: 0.8 % (ref 0.0–3.0)
Eosinophils Absolute: 0.1 10*3/uL (ref 0.0–0.7)
Eosinophils Relative: 1.7 % (ref 0.0–5.0)
HCT: 39.7 % (ref 36.0–46.0)
Hemoglobin: 13 g/dL (ref 12.0–15.0)
Lymphocytes Relative: 22.8 % (ref 12.0–46.0)
Lymphs Abs: 0.8 10*3/uL (ref 0.7–4.0)
MCHC: 32.8 g/dL (ref 30.0–36.0)
MCV: 88.8 fl (ref 78.0–100.0)
Monocytes Absolute: 0.3 10*3/uL (ref 0.1–1.0)
Monocytes Relative: 8.5 % (ref 3.0–12.0)
Neutro Abs: 2.4 10*3/uL (ref 1.4–7.7)
Neutrophils Relative %: 66.2 % (ref 43.0–77.0)
Platelets: 232 10*3/uL (ref 150.0–400.0)
RBC: 4.47 Mil/uL (ref 3.87–5.11)
RDW: 14.1 % (ref 11.5–15.5)
WBC: 3.7 10*3/uL — ABNORMAL LOW (ref 4.0–10.5)

## 2019-04-12 LAB — BASIC METABOLIC PANEL
BUN: 15 mg/dL (ref 6–23)
CO2: 25 mEq/L (ref 19–32)
Calcium: 9.2 mg/dL (ref 8.4–10.5)
Chloride: 105 mEq/L (ref 96–112)
Creatinine, Ser: 0.75 mg/dL (ref 0.40–1.20)
GFR: 93.05 mL/min (ref >60.00)
Glucose, Bld: 133 mg/dL — ABNORMAL HIGH (ref 70–99)
Potassium: 4.2 mEq/L (ref 3.5–5.1)
Sodium: 139 mEq/L (ref 135–145)

## 2019-04-12 LAB — URINALYSIS, ROUTINE W REFLEX MICROSCOPIC
Bilirubin Urine: NEGATIVE
Hgb urine dipstick: NEGATIVE
Ketones, ur: NEGATIVE
Leukocytes,Ua: NEGATIVE
Nitrite: NEGATIVE
RBC / HPF: NONE SEEN (ref 0–?)
Specific Gravity, Urine: 1.02 (ref 1.000–1.030)
Total Protein, Urine: NEGATIVE
Urine Glucose: NEGATIVE
Urobilinogen, UA: 0.2 (ref 0.0–1.0)
pH: 5.5 (ref 5.0–8.0)

## 2019-04-12 LAB — LIPID PANEL
Cholesterol: 224 mg/dL — ABNORMAL HIGH (ref 0–200)
HDL: 59.4 mg/dL (ref >39.00)
LDL Cholesterol: 152 mg/dL — ABNORMAL HIGH (ref 0–99)
NonHDL: 165.02
Total CHOL/HDL Ratio: 4
Triglycerides: 63 mg/dL (ref 0.0–149.0)
VLDL: 12.6 mg/dL (ref 0.0–40.0)

## 2019-04-12 LAB — IBC PANEL
Iron: 89 ug/dL (ref 42–145)
Saturation Ratios: 28.6 % (ref 20.0–50.0)
Transferrin: 222 mg/dL (ref 212.0–360.0)

## 2019-04-12 LAB — HEPATIC FUNCTION PANEL
ALT: 15 U/L (ref 0–35)
AST: 15 U/L (ref 0–37)
Albumin: 4.2 g/dL (ref 3.5–5.2)
Alkaline Phosphatase: 67 U/L (ref 39–117)
Bilirubin, Direct: 0.1 mg/dL (ref 0.0–0.3)
Total Bilirubin: 0.5 mg/dL (ref 0.2–1.2)
Total Protein: 8 g/dL (ref 6.0–8.3)

## 2019-04-12 LAB — VITAMIN D 25 HYDROXY (VIT D DEFICIENCY, FRACTURES): VITD: 41.82 ng/mL (ref 30.00–100.00)

## 2019-04-12 LAB — VITAMIN B12: Vitamin B-12: 158 pg/mL — ABNORMAL LOW (ref 211–911)

## 2019-04-12 LAB — MICROALBUMIN / CREATININE URINE RATIO
Creatinine,U: 104.6 mg/dL
Microalb Creat Ratio: 0.7 mg/g (ref 0.0–30.0)
Microalb, Ur: 0.8 mg/dL (ref 0.0–1.9)

## 2019-04-12 LAB — TSH: TSH: 1.58 u[IU]/mL (ref 0.35–4.50)

## 2019-04-12 LAB — HEMOGLOBIN A1C: Hgb A1c MFr Bld: 7.9 % — ABNORMAL HIGH (ref 4.6–6.5)

## 2019-04-16 ENCOUNTER — Ambulatory Visit (INDEPENDENT_AMBULATORY_CARE_PROVIDER_SITE_OTHER): Payer: Medicare Other | Admitting: *Deleted

## 2019-04-16 ENCOUNTER — Other Ambulatory Visit: Payer: Self-pay

## 2019-04-16 VITALS — BP 136/82 | HR 75 | Resp 17 | Ht 62.0 in | Wt 150.0 lb

## 2019-04-16 DIAGNOSIS — Z0000 Encounter for general adult medical examination without abnormal findings: Secondary | ICD-10-CM

## 2019-04-16 NOTE — Patient Instructions (Addendum)
Continue doing brain stimulating activities (puzzles, reading, adult coloring books, staying active) to keep memory sharp.   Continue to eat heart healthy diet (full of fruits, vegetables, whole grains, lean protein, water--limit salt, fat, and sugar intake) and increase physical activity as tolerated.   Tiffany [name] , Thank you for taking time to come for your Medicare Wellness Visit. I appreciate your ongoing commitment to your health goals. Please review the following plan we discussed and let me know if I can assist you in the future.   These are the goals we discussed: Goals    . <enter goal here>    . Blood Pressure < 140/90    . HEMOGLOBIN A1C < 7.0    . Increase the amount of physical     Join a sports center do water aerobics, walk more and get in the whirlpool    . LDL CALC < 100    . Patient Stated     I want to continue to exercise and monitor my diet for diabetes.        This is a list of the screening recommended for you and due dates:  Health Maintenance  Topic Date Due  . Eye exam for diabetics  05/01/2018  . Tetanus Vaccine  09/27/2018  . Complete foot exam   05/15/2019  . Hemoglobin A1C  07/13/2019  . Lipid (cholesterol) test  04/11/2020  . Mammogram  05/22/2020  . Pneumonia vaccines (2 of 2 - PPSV23) 03/06/2021  . Colon Cancer Screening  07/22/2022  . Flu Shot  Completed  . DEXA scan (bone density measurement)  Completed  .  Hepatitis C: One time screening is recommended by Center for Disease Control  (CDC) for  adults born from [birth year] through 1964.   Completed      Protein Content in Foods  Generally, most healthy people need around 50 grams of protein each day. Depending on your overall health, you may need more or less protein in your diet. Talk to your health care provider or dietitian about how much protein you need. See the following list for the protein content of some common foods. High-protein foods High-protein foods contain 4 grams (4 g) or more of  protein per serving. They include:  Beef, ground sirloin (cooked) - 3 oz have 24 g of protein.  Cheese (hard) - 1 oz has 7 g of protein.  Chicken breast, boneless and skinless (cooked) - 3 oz have 13.4 g of protein.  Cottage cheese - 1/2 cup has 13.4 g of protein.  Egg - 1 egg has 6 g of protein.  Fish, filet (cooked) - 1 oz has 6-7 g of protein.  Garbanzo beans (canned or cooked) - 1/2 cup has 6-7 g of protein.  Kidney beans (canned or cooked) - 1/2 cup has 6-7 g of protein.  Lamb (cooked) - 3 oz has 24 g of protein.  Milk - 1 cup (8 oz) has 8 g of protein.  Nuts (peanuts, pistachios, almonds) - 1 oz has 6 g of protein.  Peanut butter - 1 oz has 7-8 g of protein.  Pork tenderloin (cooked) - 3 oz has 18.4 g of protein.  Pumpkin seeds - 1 oz has 8.5 g of protein.  Soybeans (roasted) - 1 oz has 8 g of protein.  Soybeans (cooked) - 1/2 cup has 11 g of protein.  Soy milk - 1 cup (8 oz) has 5-10 g of protein.  Soy or vegetable patty - 1 patty has  11 g of protein.  Sunflower seeds - 1 oz has 5.5 g of protein.  Tofu (firm) - 1/2 cup has 20 g of protein.  Tuna (canned in water) - 3 oz has 20 g of protein.  Yogurt - 6 oz has 8 g of protein. Low-protein foods Low-protein foods contain 3 grams (3 g) or less of protein per serving. They include:  Beets (raw or cooked) - 1/2 cup has 1.5 g of protein.  Bran cereal - 1/2 cup has 2-3 g of protein.  Bread - 1 slice has 2.5 g of protein.  Broccoli (raw or cooked) - 1/2 cup has 2 g of protein.  Collard greens (raw or cooked) - 1/2 cup has 2 g of protein.  Corn (fresh or cooked) - 1/2 cup has 2 g of protein.  Cream cheese - 1 oz has 2 g of protein.  Creamer (half-and-half) - 1 oz has 1 g of protein.  Flour tortilla - 1 tortilla has 2.5 g of protein  Frozen yogurt - 1/2 cup has 3 g of protein.  Fruit or vegetable juice - 1/2 cup has 1 g of protein.  Green beans (raw or cooked) - 1/2 cup has 1 g of protein.  Green  peas (canned) - 1/2 cup has 3.5 g of protein.  Muffins - 1 small muffin (2 oz) has 3 g of protein.  Oatmeal (cooked) - 1/2 cup has 3 g of protein.  Potato (baked with skin) - 1 medium potato has 3 g of protein.  Rice (cooked) - 1/2 cup has 2.5-3.5 g of protein.  Sour cream - 1/2 cup has 2.5 g of protein.  Spinach (cooked) - 1/2 cup has 3 g of protein.  Squash (cooked) - 1/2 cup has 1.5 g of protein. Actual amounts of protein may be different depending on processing. Talk with your health care provider or dietitian about what foods are recommended for you. This information is not intended to replace advice given to you by your health care provider. Make sure you discuss any questions you have with your health care provider. Document Released: 08/12/2015 Document Revised: 01/22/2016 Document Reviewed: 01/22/2016 Elsevier Patient Education  [place].  Diabetes Mellitus and Nutrition, Adult When you have diabetes (diabetes mellitus), it is very important to have healthy eating habits because your blood sugar (glucose) levels are greatly affected by what you eat and drink. Eating healthy foods in the appropriate amounts, at about the same times every day, can help you:  Control your blood glucose.  Lower your risk of heart disease.  Improve your blood pressure.  Reach or maintain a healthy weight. Every person with diabetes is different, and each person has different needs for a meal plan. Your health care provider may recommend that you work with a diet and nutrition specialist (dietitian) to make a meal plan that is best for you. Your meal plan may vary depending on factors such as:  The calories you need.  The medicines you take.  Your weight.  Your blood glucose, blood pressure, and cholesterol levels.  Your activity level.  Other health conditions you have, such as heart or kidney disease. How do carbohydrates affect me? Carbohydrates, also called carbs, affect  your blood glucose level more than any other type of food. Eating carbs naturally raises the amount of glucose in your blood. Carb counting is a method for keeping track of how many carbs you eat. Counting carbs is important to keep your blood glucose at  a healthy level, especially if you use insulin or take certain oral diabetes medicines. It is important to know how many carbs you can safely have in each meal. This is different for every person. Your dietitian can help you calculate how many carbs you should have at each meal and for each snack. Foods that contain carbs include:  Bread, cereal, rice, pasta, and crackers.  Potatoes and corn.  Peas, beans, and lentils.  Milk and yogurt.  Fruit and juice.  Desserts, such as cakes, cookies, ice cream, and candy. How does alcohol affect me? Alcohol can cause a sudden decrease in blood glucose (hypoglycemia), especially if you use insulin or take certain oral diabetes medicines. Hypoglycemia can be a life-threatening condition. Symptoms of hypoglycemia (sleepiness, dizziness, and confusion) are similar to symptoms of having too much alcohol. If your health care provider says that alcohol is safe for you, follow these guidelines:  Limit alcohol intake to no more than 1 drink per day for nonpregnant women and 2 drinks per day for men. One drink equals 12 oz of beer, 5 oz of Tiffany [name], or 1 oz of hard liquor.  Do not drink on an empty stomach.  Keep yourself hydrated with water, diet soda, or unsweetened iced tea.  Keep in mind that regular soda, juice, and other mixers may contain a lot of sugar and must be counted as carbs. What are tips for following this plan?  Reading food labels  Start by checking the serving size on the "Nutrition Facts" label of packaged foods and drinks. The amount of calories, carbs, fats, and other nutrients listed on the label is based on one serving of the item. Many items contain more than one serving per package.   Check the total grams (g) of carbs in one serving. You can calculate the number of servings of carbs in one serving by dividing the total carbs by 15. For example, if a food has 30 g of total carbs, it would be equal to 2 servings of carbs.  Check the number of grams (g) of saturated and trans fats in one serving. Choose foods that have low or no amount of these fats.  Check the number of milligrams (mg) of salt (sodium) in one serving. Most people should limit total sodium intake to less than 2,300 mg per day.  Always check the nutrition information of foods labeled as "low-fat" or "nonfat". These foods may be higher in added sugar or refined carbs and should be avoided.  Talk to your dietitian to identify your daily goals for nutrients listed on the label. Shopping  Avoid buying canned, premade, or processed foods. These foods tend to be high in fat, sodium, and added sugar.  Shop around the outside edge of the grocery store. This includes fresh fruits and vegetables, bulk grains, fresh meats, and fresh dairy. Cooking  Use low-heat cooking methods, such as baking, instead of high-heat cooking methods like deep frying.  Cook using healthy oils, such as olive, canola, or sunflower oil.  Avoid cooking with butter, cream, or high-fat meats. Meal planning  Eat meals and snacks regularly, preferably at the same times every day. Avoid going long periods of time without eating.  Eat foods high in fiber, such as fresh fruits, vegetables, beans, and whole grains. Talk to your dietitian about how many servings of carbs you can eat at each meal.  Eat 4-6 ounces (oz) of lean protein each day, such as lean meat, chicken, fish, eggs, or tofu. One  oz of lean protein is equal to: ? 1 oz of meat, chicken, or fish. ? 1 egg. ?  cup of tofu.  Eat some foods each day that contain healthy fats, such as avocado, nuts, seeds, and fish. Lifestyle  Check your blood glucose regularly.  Exercise regularly  as told by your health care provider. This may include: ? 150 minutes of moderate-intensity or vigorous-intensity exercise each week. This could be brisk walking, biking, or water aerobics. ? Stretching and doing strength exercises, such as yoga or weightlifting, at least 2 times a week.  Take medicines as told by your health care provider.  Do not use any products that contain nicotine or tobacco, such as cigarettes and e-cigarettes. If you need help quitting, ask your health care provider.  Work with a Social worker or diabetes educator to identify strategies to manage stress and any emotional and social challenges. Questions to ask a health care provider  Do I need to meet with a diabetes educator?  Do I need to meet with a dietitian?  What number can I call if I have questions?  When are the best times to check my blood glucose? Where to find more information:  American Diabetes Association: diabetes.org  Academy of Nutrition and Dietetics: www.eatright.[employer] of Diabetes and Digestive and Kidney Diseases (NIH): [employer] Summary  A healthy meal plan will help you control your blood glucose and maintain a healthy lifestyle.  Working with a diet and nutrition specialist (dietitian) can help you make a meal plan that is best for you.  Keep in mind that carbohydrates (carbs) and alcohol have immediate effects on your blood glucose levels. It is important to count carbs and to use alcohol carefully. This information is not intended to replace advice given to you by your health care provider. Make sure you discuss any questions you have with your health care provider. Document Released: 02/07/2005 Document Revised: 04/25/2017 Document Reviewed: 06/17/2016 Elsevier Patient Education  2020 [place] for Diabetes Mellitus, Adult  Carbohydrate counting is a method of keeping track of how many carbohydrates you eat. Eating carbohydrates  naturally increases the amount of sugar (glucose) in the blood. Counting how many carbohydrates you eat helps keep your blood glucose within normal limits, which helps you manage your diabetes (diabetes mellitus). It is important to know how many carbohydrates you can safely have in each meal. This is different for every person. A diet and nutrition specialist (registered dietitian) can help you make a meal plan and calculate how many carbohydrates you should have at each meal and snack. Carbohydrates are found in the following foods:  Grains, such as breads and cereals.  Dried beans and soy products.  Starchy vegetables, such as potatoes, peas, and corn.  Fruit and fruit juices.  Milk and yogurt.  Sweets and snack foods, such as cake, cookies, candy, chips, and soft drinks. How do I count carbohydrates? There are two ways to count carbohydrates in food. You can use either of the methods or a combination of both. Reading "Nutrition Facts" on packaged food The "Nutrition Facts" list is included on the labels of almost all packaged foods and beverages in the [place] It includes:  The serving size.  Information about nutrients in each serving, including the grams (g) of carbohydrate per serving. To use the "Nutrition Facts":  Decide how many servings you will have.  Multiply the number of servings by the number of carbohydrates per serving.  The resulting number  is the total amount of carbohydrates that you will be having. Learning standard serving sizes of other foods When you eat carbohydrate foods that are not packaged or do not include "Nutrition Facts" on the label, you need to measure the servings in order to count the amount of carbohydrates:  Measure the foods that you will eat with a food scale or measuring cup, if needed.  Decide how many standard-size servings you will eat.  Multiply the number of servings by 15. Most carbohydrate-rich foods have about 15 g of carbohydrates  per serving. ? For example, if you eat 8 oz (170 g) of strawberries, you will have eaten 2 servings and 30 g of carbohydrates (2 servings x 15 g = 30 g).  For foods that have more than one food mixed, such as soups and casseroles, you must count the carbohydrates in each food that is included. The following list contains standard serving sizes of common carbohydrate-rich foods. Each of these servings has about 15 g of carbohydrates:   hamburger bun or  English muffin.   oz (15 mL) syrup.   oz (14 g) jelly.  1 slice of bread.  1 six-inch tortilla.  3 oz (85 g) cooked rice or pasta.  4 oz (113 g) cooked dried beans.  4 oz (113 g) starchy vegetable, such as peas, corn, or potatoes.  4 oz (113 g) hot cereal.  4 oz (113 g) mashed potatoes or  of a large baked potato.  4 oz (113 g) canned or frozen fruit.  4 oz (120 mL) fruit juice.  4-6 crackers.  6 chicken nuggets.  6 oz (170 g) unsweetened dry cereal.  6 oz (170 g) plain fat-free yogurt or yogurt sweetened with artificial sweeteners.  8 oz (240 mL) milk.  8 oz (170 g) fresh fruit or one small piece of fruit.  24 oz (680 g) popped popcorn. Example of carbohydrate counting Sample meal  3 oz (85 g) chicken breast.  6 oz (170 g) brown rice.  4 oz (113 g) corn.  8 oz (240 mL) milk.  8 oz (170 g) strawberries with sugar-free whipped topping. Carbohydrate calculation 1. Identify the foods that contain carbohydrates: ? Rice. ? Corn. ? Milk. ? Strawberries. 2. Calculate how many servings you have of each food: ? 2 servings rice. ? 1 serving corn. ? 1 serving milk. ? 1 serving strawberries. 3. Multiply each number of servings by 15 g: ? 2 servings rice x 15 g = 30 g. ? 1 serving corn x 15 g = 15 g. ? 1 serving milk x 15 g = 15 g. ? 1 serving strawberries x 15 g = 15 g. 4. Add together all of the amounts to find the total grams of carbohydrates eaten: ? 30 g + 15 g + 15 g + 15 g = 75 g of carbohydrates  total. Summary  Carbohydrate counting is a method of keeping track of how many carbohydrates you eat.  Eating carbohydrates naturally increases the amount of sugar (glucose) in the blood.  Counting how many carbohydrates you eat helps keep your blood glucose within normal limits, which helps you manage your diabetes.  A diet and nutrition specialist (registered dietitian) can help you make a meal plan and calculate how many carbohydrates you should have at each meal and snack. This information is not intended to replace advice given to you by your health care provider. Make sure you discuss any questions you have with your health care provider.  Document Released: 05/13/2005 Document Revised: 12/05/2016 Document Reviewed: 10/25/2015 Elsevier Patient Education  2020 [place] 65 Years and Older, Female Preventive care refers to lifestyle choices and visits with your health care provider that can promote health and wellness. This includes:  A yearly physical exam. This is also called an annual well check.  Regular dental and eye exams.  Immunizations.  Screening for certain conditions.  Healthy lifestyle choices, such as diet and exercise. What can I expect for my preventive care visit? Physical exam Your health care provider will check:  Height and weight. These may be used to calculate body mass index (BMI), which is a measurement that tells if you are at a healthy weight.  Heart rate and blood pressure.  Your skin for abnormal spots. Counseling Your health care provider may ask you questions about:  Alcohol, tobacco, and drug use.  Emotional well-being.  Home and relationship well-being.  Sexual activity.  Eating habits.  History of falls.  Memory and ability to understand (cognition).  Work and work Statistician.  Pregnancy and menstrual history. What immunizations do I need?  Influenza (flu) vaccine  This is recommended every year.  Tetanus, diphtheria, and pertussis (Tdap) vaccine  You may need a Td booster every 10 years. Varicella (chickenpox) vaccine  You may need this vaccine if you have not already been vaccinated. Zoster (shingles) vaccine  You may need this after age 82. Pneumococcal conjugate (PCV13) vaccine  One dose is recommended after age 69. Pneumococcal polysaccharide (PPSV23) vaccine  One dose is recommended after age 9. Measles, mumps, and rubella (MMR) vaccine  You may need at least one dose of MMR if you were born in 1957 or later. You may also need a second dose. Meningococcal conjugate (MenACWY) vaccine  You may need this if you have certain conditions. Hepatitis A vaccine  You may need this if you have certain conditions or if you travel or work in places where you may be exposed to hepatitis A. Hepatitis B vaccine  You may need this if you have certain conditions or if you travel or work in places where you may be exposed to hepatitis B. Haemophilus influenzae type b (Hib) vaccine  You may need this if you have certain conditions. You may receive vaccines as individual doses or as more than one vaccine together in one shot (combination vaccines). Talk with your health care provider about the risks and benefits of combination vaccines. What tests do I need? Blood tests  Lipid and cholesterol levels. These may be checked every 5 years, or more frequently depending on your overall health.  Hepatitis C test.  Hepatitis B test. Screening  Lung cancer screening. You may have this screening every year starting at age 12 if you have a 30-pack-year history of smoking and currently smoke or have quit within the past 15 years.  Colorectal cancer screening. All adults should have this screening starting at age 42 and continuing until age 16. Your health care provider may recommend screening at age 33 if you are at increased risk. You will have tests every 1-10 years, depending on your  results and the type of screening test.  Diabetes screening. This is done by checking your blood sugar (glucose) after you have not eaten for a while (fasting). You may have this done every 1-3 years.  Mammogram. This may be done every 1-2 years. Talk with your health care provider about how often you should have regular mammograms.  BRCA-related  cancer screening. This may be done if you have a family history of breast, ovarian, tubal, or peritoneal cancers. Other tests  Sexually transmitted disease (STD) testing.  Bone density scan. This is done to screen for osteoporosis. You may have this done starting at age 85. Follow these instructions at home: Eating and drinking  Eat a diet that includes fresh fruits and vegetables, whole grains, lean protein, and low-fat dairy products. Limit your intake of foods with high amounts of sugar, saturated fats, and salt.  Take vitamin and mineral supplements as recommended by your health care provider.  Do not drink alcohol if your health care provider tells you not to drink.  If you drink alcohol: ? Limit how much you have to 0-1 drink a day. ? Be aware of how much alcohol is in your drink. In the [place] one drink equals one 12 oz bottle of beer (355 mL), one 5 oz glass of [place] (148 mL), or one 1 oz glass of hard liquor (44 mL). Lifestyle  Take daily care of your teeth and gums.  Stay active. Exercise for at least 30 minutes on 5 or more days each week.  Do not use any products that contain nicotine or tobacco, such as cigarettes, e-cigarettes, and chewing tobacco. If you need help quitting, ask your health care provider.  If you are sexually active, practice safe sex. Use a condom or other form of protection in order to prevent STIs (sexually transmitted infections).  Talk with your health care provider about taking a low-dose aspirin or statin. What's next?  Go to your health care provider once a year for a well check visit.  Ask your  health care provider how often you should have your eyes and teeth checked.  Stay up to date on all vaccines. This information is not intended to replace advice given to you by your health care provider. Make sure you discuss any questions you have with your health care provider. Document Released: 06/09/2015 Document Revised: 05/07/2018 Document Reviewed: 05/07/2018 Elsevier Patient Education  2020 [employer].

## 2019-04-16 NOTE — Progress Notes (Addendum)
Subjective:   Tiffany [name] is a 68 y.o. female who presents for Medicare Annual (Subsequent) preventive examination. Review of Systems:   This visit occurred during the SARS-CoV-2 public health emergency.  Safety protocols were in place, including screening questions prior to the visit, additional usage of staff PPE, and extensive cleaning of exam room while observing appropriate contact time as indicated for disinfecting solutions.   Cardiac Risk Factors include: advanced age (>102mn, >>10women);diabetes mellitus;hypertension Sleep patterns: gets up 1 times nightly to void and sleeps 6-7 hours nightly.    Home Safety/Smoke Alarms: Feels safe in home. Smoke alarms in place.  Living environment; residence and Firearm Safety: apartment. Lives alone, no needs for DME, good support system Seat Belt Safety/Bike Helmet: Wears seat belt.     Objective:     Vitals: BP 136/82   Pulse 75   Resp 17   Ht 5' 2" (1.575 m)   Wt 150 lb (68 kg)   SpO2 99%   BMI 27.44 kg/m   Body mass index is 27.44 kg/m.  Advanced Directives 04/16/2019 03/10/2017 10/06/2015 10/02/2015 07/03/2015 06/15/2015 02/07/2015  Does Patient Have a Medical Advance Directive? _0  No (No Data)  Would patient like information on creating a medical advance directive? Yes (ED - Information included in AVS) Yes (ED - Information included in AVS) - - No - patient declined information Yes - Educational materials given -    Tobacco Social History   Tobacco Use  Smoking Status Former Smoker  . Packs/day: 0.50  . Years: 15.00  . Pack years: 7.50  . Quit date: 08/02/1990  . Years since quitting: 28.7  Smokeless Tobacco Never Used     Counseling given: Not Answered  Past Medical History:  Diagnosis Date  . Degenerative joint disease   . Diabetes mellitus   . Headache   . Hyperlipidemia   . Hypertension   . Sleep apnea    stopped using it   Past Surgical History:  Procedure Laterality Date  . ABDOMINAL  HYSTERECTOMY  1976  . BACK SURGERY    . KNEE ARTHROSCOPY     left x 2  . LUMBAR LAMINECTOMY  1989   Family History  Problem Relation Age of Onset  . Hypertension Mother   . Diabetes Mother   . Thyroid disease Mother   . Heart disease Mother   . Kidney disease Mother   . Diabetes Sister   . Hypertension Brother   . Stroke Brother   . Heart disease Brother   . Colon cancer Neg Hx   . Colon polyps Neg Hx   . Rectal cancer Neg Hx   . Stomach cancer Neg Hx    Social History   Socioeconomic History  . Marital status: Single    Spouse name: Not on file  . Number of children: 2  . Years of education: 8  . Highest education level: Not on file  Occupational History  . Occupation: Disability  Social Needs  . Financial resource strain: Not hard at all  . Food insecurity    Worry: Never true    Inability: Never true  . Transportation needs    Medical: No    Non-medical: No  Tobacco Use  . Smoking status: Former Smoker    Packs/day: 0.50    Years: 15.00    Pack years: 7.50    Quit date: 08/02/1990    Years since quitting: 28.7  . Smokeless tobacco: Never Used  Substance and Sexual Activity  . Alcohol use: No    Alcohol/week: 0.0 standard drinks  . Drug use: No  . Sexual activity: Never  Lifestyle  . Physical activity    Days per week: 5 days    Minutes per session: 30 min  . Stress: Not at all  Relationships  . Social connections    Talks on phone: More than three times a week    Gets together: More than three times a week    Attends religious service: Not on file    Active member of club or organization: Not on file    Attends meetings of clubs or organizations: Not on file    Relationship status: Not on file  Other Topics Concern  . Not on file  Social History Narrative   Born in [place], [place].   Completed the 8th grade   Fun: Walk, crochet, puzzles   Diet - Variety of foods, does not eat breakfast   2 children - 44 and 45    6 grandchildren - 7 great  grandchildren.     Outpatient Encounter Medications as of 04/16/2019  Medication Sig  . ACCU-CHEK SOFTCLIX LANCETS lancets USE TO CHECK BLOOD SUGARS TWO TIMES DAILY.  Marland Kitchen amLODipine (NORVASC) 5 MG tablet TAKE 1 TABLET (5 MG TOTAL) BY MOUTH DAILY.  Marland Kitchen Blood Glucose Monitoring Suppl (ACCU-CHEK NANO SMARTVIEW) w/Device KIT Use to check blood sugar as directed up to 3 times a day.  . gabapentin (NEURONTIN) 400 MG capsule TAKE 1 CAPSULE BY MOUTH THREE TIMES DAILY  . glimepiride (AMARYL) 4 MG tablet Take 1 tablet (4 mg total) by mouth daily with breakfast.  . glucose blood (ACCU-CHEK AVIVA PLUS) test strip 1 each by Other route 3 (three) times daily. Use to check blood sugars three times a day Dx E11.8  . Insulin Pen Needle 32G X 4 MM MISC Use to inject  lantus once daily  . LANTUS SOLOSTAR 100 UNIT/ML Solostar Pen INJECT 55 UNITS INTO THE SKIN DAILY.  Marland Kitchen quinapril (ACCUPRIL) 40 MG tablet Take 1 tablet (40 mg total) by mouth daily.  Marland Kitchen tiZANidine (ZANAFLEX) 4 MG tablet TAKE 1 TABLET BY MOUTH EVERY 8 HOURS AS NEEDED FOR MUSCLE SPASM  . zolpidem (AMBIEN) 10 MG tablet TAKE 1 TABLET BY MOUTH AT BEDTIME AS NEEDED  . ezetimibe (ZETIA) 10 MG tablet TAKE 1 TABLET BY MOUTH EVERY DAY (Patient not taking: Reported on 04/16/2019)  . [DISCONTINUED] HYDROcodone-acetaminophen (NORCO/VICODIN) 5-325 MG tablet Take 1 tablet by mouth every 6 (six) hours as needed for moderate pain. (Patient not taking: Reported on 04/16/2019)  . [DISCONTINUED] ranitidine (ZANTAC) 150 MG tablet Take 150 mg by mouth 2 (two) times daily as needed for heartburn.  . [DISCONTINUED] Semaglutide,0.25 or 0.5MG/DOS, (OZEMPIC, 0.25 OR 0.5 MG/DOSE,) 2 MG/1.5ML SOPN Inject 0.5 mg into the skin once a week. (Patient not taking: Reported on 04/16/2019)  . [DISCONTINUED] Vitamin D, Ergocalciferol, (DRISDOL) 50000 units CAPS capsule Take 1 capsule (50,000 Units total) by mouth every 7 (seven) days. (Patient not taking: Reported on 04/16/2019)   No  facility-administered encounter medications on file as of 04/16/2019.     Activities of Daily Living In your present state of health, do you have any difficulty performing the following activities: 04/16/2019  Hearing? N  Vision? N  Difficulty concentrating or making decisions? N  Walking or climbing stairs? N  Dressing or bathing? N  Doing errands, shopping? N  Preparing Food and eating ? N  Using the Toilet?  N  In the past six months, have you accidently leaked urine? N  Do you have problems with loss of bowel control? N  Managing your Medications? N  Managing your Finances? N  Housekeeping or managing your Housekeeping? N  Some recent data might be hidden    Patient Care Team: Biagio Borg, MD as PCP - General (Internal Medicine)    Assessment:   This is a routine wellness examination for [place]. Physical assessment deferred to PCP.   Exercise Activities and Dietary recommendations Current Exercise Habits: Home exercise routine, Type of exercise: walking, Time (Minutes): 30, Frequency (Times/Week): 5, Weekly Exercise (Minutes/Week): 150, Intensity: Mild, Exercise limited by: orthopedic condition(s) Diet (meal preparation, eat out, water intake, caffeinated beverages, dairy products, fruits and vegetables): in general, a "healthy" diet  , on average, 1-2 meals per day. Reports poor appetite.    Discussed supplementing with Glucerna, samples and coupons provided. Encouraged patient to increase daily water and healthy fluid intake. Diet education was attached to patient's AVS.  Goals    . <enter goal here>    . Blood Pressure < 140/90    . HEMOGLOBIN A1C < 7.0    . Increase the amount of physical     Join a sports center do water aerobics, walk more and get in the whirlpool    . LDL CALC < 100    . Patient Stated     I want to continue to exercise and monitor my diet for diabetes.        Fall Risk Fall Risk  04/16/2019 05/14/2018 03/10/2017 02/07/2015 11/03/2014   Falls in the past year? 0 0 No No No  Comment - - - - -  Number falls in past yr: 0 - - - -  Injury with Fall? 0 - - - -  Risk for fall due to : - - - Impaired balance/gait Other (Comment)   Depression Screen PHQ 2/9 Scores 04/16/2019 05/14/2018 03/10/2017 03/06/2016  PHQ - 2 Score 0 0 0 0  Exception Documentation - - - -     Cognitive Function MMSE - Mini Mental State Exam 03/10/2017 02/07/2015  Not completed: - Refused  Orientation to time 5 -  Orientation to Place 5 -  Registration 3 -  Attention/ Calculation 5 -  Recall 2 -  Language- name 2 objects 2 -  Language- repeat 1 -  Language- follow 3 step command 3 -  Language- read & follow direction 1 -  Write a sentence 1 -  Copy design 1 -  Total score 29 -       Ad8 score reviewed for issues:  Issues making decisions: no  Less interest in hobbies / activities: no  Repeats questions, stories (family complaining): no  Trouble using ordinary gadgets (microwave, computer, phone):no  Forgets the month or year: no  Mismanaging finances: no  Remembering appts: no  Daily problems with thinking and/or memory: no Ad8 score is= 0   Immunization History  Administered Date(s) Administered  . Influenza Split 06/08/2012  . Influenza Whole 05/25/2007, 03/17/2008, 03/27/2009  . Influenza, High Dose Seasonal PF 03/10/2017, 03/15/2019  . Influenza,inj,Quad PF,6+ Mos 02/07/2015, 03/06/2016  . Pneumococcal Conjugate-13 05/14/2018  . Pneumococcal Polysaccharide-23 06/27/2004, 03/06/2016  . Td 09/26/2008  . Zoster 02/14/2014   Screening Tests Health Maintenance  Topic Date Due  . OPHTHALMOLOGY EXAM  05/01/2018  . TETANUS/TDAP  09/27/2018  . FOOT EXAM  05/15/2019  . HEMOGLOBIN A1C  07/13/2019  . LIPID PANEL  04/11/2020  . MAMMOGRAM  05/22/2020  . PNA vac Low Risk Adult (2 of 2 - PPSV23) 03/06/2021  . COLONOSCOPY  07/22/2022  . INFLUENZA VACCINE  Completed  . DEXA SCAN  Completed  . Hepatitis C Screening  Completed       Plan:     Reviewed health maintenance screenings with patient today and relevant education, vaccines, and/or referrals were provided.   I have personally reviewed and noted the following in the patient's chart:   . Medical and social history . Use of alcohol, tobacco or illicit drugs  . Current medications and supplements . Functional ability and status . Nutritional status . Physical activity . Advanced directives . List of other physicians . Vitals . Screenings to include cognitive, depression, and falls . Referrals and appointments  In addition, I have reviewed and discussed with patient certain preventive protocols, quality metrics, and best practice recommendations. A written personalized care plan for preventive services as well as general preventive health recommendations were provided to patient.     Michiel Cowboy, RN  04/16/2019    Medical screening examination/treatment/procedure(s) were performed by non-physician practitioner and as supervising physician I was immediately available for consultation/collaboration. I agree with above. Cathlean Cower, MD

## 2019-04-19 ENCOUNTER — Other Ambulatory Visit: Payer: Self-pay | Admitting: Internal Medicine

## 2019-04-19 DIAGNOSIS — M7989 Other specified soft tissue disorders: Secondary | ICD-10-CM

## 2019-04-21 ENCOUNTER — Other Ambulatory Visit: Payer: Self-pay

## 2019-04-21 ENCOUNTER — Ambulatory Visit: Payer: Medicare Other | Admitting: Podiatry

## 2019-04-21 ENCOUNTER — Encounter: Payer: Self-pay | Admitting: Podiatry

## 2019-04-21 DIAGNOSIS — E119 Type 2 diabetes mellitus without complications: Secondary | ICD-10-CM

## 2019-04-21 DIAGNOSIS — Q828 Other specified congenital malformations of skin: Secondary | ICD-10-CM

## 2019-04-21 NOTE — Progress Notes (Signed)
This patient presents the office today with chief complaint of painful calluses under the ball of both feet.  She states that the calluses are painful walking and wearing her shoes.  Patient says she self treats the calluses but desires to have professional care provided today.  Patient is is diabetic.  She presents the office today for an evaluation and treatment of her painful calluses both feet.  Vascular  Dorsalis pedis and posterior tibial pulses are palpable  B/L.  Capillary return  WNL.  Temperature gradient is  WNL.  Skin turgor  WNL  Sensorium  Senn Weinstein monofilament wire  WNL. Normal tactile sensation.  Nail Exam  Patient has normal nails with no evidence of bacterial or fungal infection.  Orthopedic  Exam  Muscle tone and muscle strength  WNL.  No limitations of motion feet  B/L.  No crepitus or joint effusion noted.  Foot type is unremarkable and digits show no abnormalities.  Bony prominences are unremarkable.  Skin  No open lesions.  Normal skin texture and turgor.  Calluses noted 1,5 right and 2,4 left.  Porokeratosis  B/L.  ROV.  Debride callus  B/L.  RTC prn.   Gardiner Barefoot DPM

## 2019-04-23 ENCOUNTER — Other Ambulatory Visit: Payer: Self-pay | Admitting: Internal Medicine

## 2019-04-28 ENCOUNTER — Other Ambulatory Visit: Payer: Self-pay | Admitting: Internal Medicine

## 2019-04-28 DIAGNOSIS — I10 Essential (primary) hypertension: Secondary | ICD-10-CM

## 2019-05-03 ENCOUNTER — Encounter: Payer: Self-pay | Admitting: Internal Medicine

## 2019-05-03 ENCOUNTER — Ambulatory Visit (INDEPENDENT_AMBULATORY_CARE_PROVIDER_SITE_OTHER): Payer: Medicare Other | Admitting: Internal Medicine

## 2019-05-03 ENCOUNTER — Other Ambulatory Visit: Payer: Self-pay

## 2019-05-03 VITALS — BP 136/84 | HR 97 | Temp 98.4°F | Wt 148.0 lb

## 2019-05-03 DIAGNOSIS — I10 Essential (primary) hypertension: Secondary | ICD-10-CM | POA: Diagnosis not present

## 2019-05-03 DIAGNOSIS — G8929 Other chronic pain: Secondary | ICD-10-CM | POA: Insufficient documentation

## 2019-05-03 DIAGNOSIS — E538 Deficiency of other specified B group vitamins: Secondary | ICD-10-CM | POA: Insufficient documentation

## 2019-05-03 DIAGNOSIS — E1165 Type 2 diabetes mellitus with hyperglycemia: Secondary | ICD-10-CM | POA: Diagnosis not present

## 2019-05-03 DIAGNOSIS — Z23 Encounter for immunization: Secondary | ICD-10-CM

## 2019-05-03 DIAGNOSIS — Z794 Long term (current) use of insulin: Secondary | ICD-10-CM

## 2019-05-03 DIAGNOSIS — K219 Gastro-esophageal reflux disease without esophagitis: Secondary | ICD-10-CM | POA: Diagnosis not present

## 2019-05-03 DIAGNOSIS — Z0000 Encounter for general adult medical examination without abnormal findings: Secondary | ICD-10-CM

## 2019-05-03 DIAGNOSIS — E782 Mixed hyperlipidemia: Secondary | ICD-10-CM

## 2019-05-03 DIAGNOSIS — M545 Low back pain, unspecified: Secondary | ICD-10-CM | POA: Insufficient documentation

## 2019-05-03 DIAGNOSIS — Z0001 Encounter for general adult medical examination with abnormal findings: Secondary | ICD-10-CM

## 2019-05-03 MED ORDER — ACCU-CHEK SOFTCLIX LANCETS MISC: 11 refills | Status: DC

## 2019-05-03 MED ORDER — AMLODIPINE BESYLATE 5 MG PO TABS: 5.0000 mg | ORAL_TABLET | Freq: Every day | ORAL | 3 refills | Status: DC

## 2019-05-03 MED ORDER — VITAMIN B-12 1000 MCG PO TABS: 1000.0000 ug | ORAL_TABLET | Freq: Every day | ORAL | 3 refills | Status: DC

## 2019-05-03 MED ORDER — GABAPENTIN 400 MG PO CAPS: 400.0000 mg | ORAL_CAPSULE | Freq: Three times a day (TID) | ORAL | 1 refills | Status: DC

## 2019-05-03 MED ORDER — FAMOTIDINE 20 MG PO TABS: 20.0000 mg | ORAL_TABLET | Freq: Two times a day (BID) | ORAL | 3 refills | Status: DC

## 2019-05-03 MED ORDER — TIZANIDINE HCL 4 MG PO TABS: ORAL_TABLET | ORAL | 5 refills | Status: DC

## 2019-05-03 MED ORDER — LANTUS SOLOSTAR 100 UNIT/ML ~~LOC~~ SOPN: PEN_INJECTOR | SUBCUTANEOUS | 1 refills | Status: DC

## 2019-05-03 MED ORDER — ACCU-CHEK AVIVA PLUS VI STRP: 1.0000 | ORAL_STRIP | Freq: Three times a day (TID) | 11 refills | Status: DC

## 2019-05-03 MED ORDER — QUINAPRIL HCL 40 MG PO TABS: 40.0000 mg | ORAL_TABLET | Freq: Every day | ORAL | 3 refills | Status: DC

## 2019-05-03 MED ORDER — EZETIMIBE 10 MG PO TABS: 10.0000 mg | ORAL_TABLET | Freq: Every day | ORAL | 3 refills | Status: DC

## 2019-05-03 MED ORDER — GLIMEPIRIDE 4 MG PO TABS: 4.0000 mg | ORAL_TABLET | Freq: Every day | ORAL | 3 refills | Status: DC

## 2019-05-03 MED ORDER — ZOLPIDEM TARTRATE 10 MG PO TABS: 10.0000 mg | ORAL_TABLET | Freq: Every evening | ORAL | 1 refills | Status: DC | PRN

## 2019-05-03 NOTE — Assessment & Plan Note (Signed)
For oral 12,  to f/u any worsening symptoms or concerns

## 2019-05-03 NOTE — Assessment & Plan Note (Signed)
stable overall by history and exam, recent data reviewed with pt, and pt to continue medical treatment as before,  to f/u any worsening symptoms or concerns  

## 2019-05-03 NOTE — Assessment & Plan Note (Addendum)
Ok for pepcid 20 bid,  to f/u any worsening symptoms or concerns  In addition to the time spent performing CPE, I spent an additional 25 minutes face to face,in which greater than 50% of this time was spent in counseling and coordination of care for patient's acute illness as documented, including the differential dx, treatment, further evaluation and other management of Gerd, chronic back pain, HLD, HTN, DM, B12 deficiency

## 2019-05-03 NOTE — Assessment & Plan Note (Addendum)
Uncontrolled, to increase lantus to 65 units, stable overall by history and exam, recent data reviewed with pt, and pt to continue medical treatment as before,  to f/u any worsening symptoms or concerns

## 2019-05-03 NOTE — Patient Instructions (Addendum)
You had the Tdap tetanus shot today  Ok to take the zetia every other day  Ok to increase the lantus to 65 units per day  Please take all new medication as prescribed - the b12  Please take all new medication as prescribed - the pepcid  Please continue all other medications as before, and refills have been done if requested.  Please have the pharmacy call with any other refills you may need.  Please continue your efforts at being more active, low cholesterol diet, and weight control.  You are otherwise up to date with prevention measures today.  Please keep your appointments with your specialists as you may have planned  Please return in 6 months, or sooner if needed, with Lab testing done 3-5 days before

## 2019-05-03 NOTE — Assessment & Plan Note (Signed)

## 2019-05-03 NOTE — Progress Notes (Signed)
Subjective:    Patient ID: Tiffany [name], female    DOB: September 01, 1950, 68 y.o.   MRN: [id number]  HPI   Here for wellness and f/u;  Overall doing ok;  Pt denies Chest pain, worsening SOB, DOE, wheezing, orthopnea, PND, worsening LE edema, palpitations, dizziness or syncope.  Pt denies neurological change such as new headache, facial or extremity weakness.  Pt states overall good compliance with treatment and medications, good tolerability, and has been trying to follow appropriate diet.  Pt denies worsening depressive symptoms, suicidal ideation or panic. No fever, night sweats, wt loss, loss of appetite, or other constitutional symptoms.  Pt states good ability with ADL's, has low fall risk, home safety reviewed and adequate, no other significant changes in hearing or vision, and only occasionally active with exercise.  Saw podiatry, has eye doctor soon.  Had myalgias with daily statin, wondering if can try qod.  Also,  Pt denies polydipsia, polyuria, or low sugar symptoms such as weakness or confusion improved with po intake.  Pt states overall good compliance with meds, trying to follow lower cholesterol, diabetic diet, wt overall stable but little exercise however.   Has low b12. Pt continues to have recurring LBP and neck pain without change in severity, bowel or bladder change, fever, wt loss,  worsening LE pain/numbness/weakness, gait change or falls. Does have mild worsening reflux, but noabd pain, dysphagia, n/v, bowel change or blood. Past Medical History:  Diagnosis Date  . Degenerative joint disease   . Diabetes mellitus   . Headache   . Hyperlipidemia   . Hypertension   . Sleep apnea    stopped using it   Past Surgical History:  Procedure Laterality Date  . ABDOMINAL HYSTERECTOMY  1976  . BACK SURGERY    . KNEE ARTHROSCOPY     left x 2  . [place]    reports that she quit smoking about 28 years ago. She has a 7.50 pack-year smoking history. She has never used  smokeless tobacco. She reports that she does not drink alcohol or use drugs. family history includes Diabetes in her mother and sister; Heart disease in her brother and mother; Hypertension in her brother and mother; Kidney disease in her mother; Stroke in her brother; Thyroid disease in her mother. Allergies  Allergen Reactions  . Aspirin Nausea And Vomiting    Can take EC asa  . Contrast Media [Iodinated Diagnostic Agents] Nausea And Vomiting and Swelling    Patients throat closes with nausea and vomiting   . Gadolinium Derivatives Nausea And Vomiting  . Metformin And Related Nausea And Vomiting    Severe nausea and vomiting  . Shellfish Allergy Nausea And Vomiting  . Tramadol Nausea And Vomiting and Other (See Comments)    Migraine  . Penicillins   . Statins Other (See Comments)    myalgias   Current Outpatient Medications on File Prior to Visit  Medication Sig Dispense Refill  . Blood Glucose Monitoring Suppl (ACCU-CHEK NANO SMARTVIEW) w/Device KIT Use to check blood sugar as directed up to 3 times a day. 1 kit 0  . Insulin Pen Needle 32G X 4 MM MISC Use to inject  lantus once daily 100 each 4   No current facility-administered medications on file prior to visit.       Review of Systems Constitutional: Negative for other unusual diaphoresis, sweats, appetite or weight changes HENT: Negative for other worsening hearing loss, ear pain, facial swelling, mouth sores or  neck stiffness.   Eyes: Negative for other worsening pain, redness or other visual disturbance.  Respiratory: Negative for other stridor or swelling Cardiovascular: Negative for other palpitations or other chest pain  Gastrointestinal: Negative for worsening diarrhea or loose stools, blood in stool, distention or other pain Genitourinary: Negative for hematuria, flank pain or other change in urine volume.  Musculoskeletal: Negative for myalgias or other joint swelling.  Skin: Negative for other color change, or other  wound or worsening drainage.  Neurological: Negative for other syncope or numbness. Hematological: Negative for other adenopathy or swelling Psychiatric/Behavioral: Negative for hallucinations, other worsening agitation, SI, self-injury, or new decreased concentration All otherwise neg per pt    Objective:   Physical Exam BP 136/84   Pulse 97   Temp 98.4 F (36.9 C) (Oral)   Wt 148 lb (67.1 kg)   SpO2 99%   BMI 27.07 kg/m  VS noted,  Constitutional: Pt is oriented to person, place, and time. Appears well-developed and well-nourished, in no significant distress and comfortable Head: Normocephalic and atraumatic  Eyes: Conjunctivae and EOM are normal. Pupils are equal, round, and reactive to light Right Ear: External ear normal without discharge Left Ear: External ear normal without discharge Nose: Nose without discharge or deformity Mouth/Throat: Oropharynx is without other ulcerations and moist  Neck: Normal range of motion. Neck supple. No JVD present. No tracheal deviation present or significant neck LA or mass Cardiovascular: Normal rate, regular rhythm, normal heart sounds and intact distal pulses.   Pulmonary/Chest: WOB normal and breath sounds without rales or wheezing  Abdominal: Soft. Bowel sounds are normal. NT. No HSM  Musculoskeletal: Normal range of motion. Exhibits no edema Lymphadenopathy: Has no other cervical adenopathy.  Neurological: Pt is alert and oriented to person, place, and time. Pt has normal reflexes. No cranial nerve deficit. Motor grossly intact, Gait intact Skin: Skin is warm and dry. No rash noted or new ulcerations Psychiatric:  Has normal mood and affect. Behavior is normal without agitation All otherwise neg per pt  Lab Results  Component Value Date   WBC 3.7 (L) 04/12/2019   HGB 13.0 04/12/2019   HCT 39.7 04/12/2019   PLT 232.0 04/12/2019   GLUCOSE 133 (H) 04/12/2019   CHOL 224 (H) 04/12/2019   TRIG 63.0 04/12/2019   HDL 59.40 04/12/2019    LDLCALC 152 (H) 04/12/2019   ALT 15 04/12/2019   AST 15 04/12/2019   NA 139 04/12/2019   K 4.2 04/12/2019   CL 105 04/12/2019   CREATININE 0.75 04/12/2019   BUN 15 04/12/2019   CO2 25 04/12/2019   TSH 1.58 04/12/2019   HGBA1C 7.9 (H) 04/12/2019   MICROALBUR 0.8 04/12/2019       Assessment & Plan:

## 2019-05-07 ENCOUNTER — Ambulatory Visit: Payer: Self-pay | Admitting: Internal Medicine

## 2019-05-11 ENCOUNTER — Ambulatory Visit
Admission: RE | Admit: 2019-05-11 | Discharge: 2019-05-11 | Disposition: A | Discharge status: Home / Self Care | Payer: Medicare Other | Source: Ambulatory Visit | Attending: Internal Medicine | Admitting: Internal Medicine

## 2019-05-11 ENCOUNTER — Other Ambulatory Visit: Payer: Self-pay

## 2019-05-11 ENCOUNTER — Other Ambulatory Visit: Payer: Self-pay | Admitting: Internal Medicine

## 2019-05-11 DIAGNOSIS — M7989 Other specified soft tissue disorders: Secondary | ICD-10-CM

## 2019-05-11 DIAGNOSIS — R590 Localized enlarged lymph nodes: Secondary | ICD-10-CM | POA: Diagnosis not present

## 2019-05-11 DIAGNOSIS — R599 Enlarged lymph nodes, unspecified: Secondary | ICD-10-CM

## 2019-05-19 ENCOUNTER — Ambulatory Visit
Admission: RE | Admit: 2019-05-19 | Discharge: 2019-05-19 | Disposition: A | Discharge status: Home / Self Care | Payer: Medicare Other | Source: Ambulatory Visit | Attending: Internal Medicine | Admitting: Internal Medicine

## 2019-05-19 ENCOUNTER — Other Ambulatory Visit: Payer: Self-pay | Admitting: Internal Medicine

## 2019-05-19 ENCOUNTER — Other Ambulatory Visit (HOSPITAL_COMMUNITY)
Admission: RE | Admit: 2019-05-19 | Discharge: 2019-05-19 | Disposition: A | Discharge status: Home / Self Care | Payer: Medicare Other | Source: Ambulatory Visit | Attending: Internal Medicine | Admitting: Internal Medicine

## 2019-05-19 ENCOUNTER — Other Ambulatory Visit: Payer: Self-pay

## 2019-05-19 ENCOUNTER — Other Ambulatory Visit (HOSPITAL_COMMUNITY): Payer: Self-pay | Admitting: Diagnostic Radiology

## 2019-05-19 DIAGNOSIS — R590 Localized enlarged lymph nodes: Secondary | ICD-10-CM | POA: Diagnosis not present

## 2019-05-19 DIAGNOSIS — R599 Enlarged lymph nodes, unspecified: Secondary | ICD-10-CM

## 2019-05-24 ENCOUNTER — Telehealth: Payer: Self-pay

## 2019-05-24 LAB — SURGICAL PATHOLOGY

## 2019-05-24 NOTE — Telephone Encounter (Signed)
Copied from [place] [id number]. Topic: Quick Communication - See Telephone Encounter >> May 24, 2019  4:25 PM Loma [place] wrote: CRM for notification. See Telephone encounter for: 05/24/19.pt is experiencing pain from recent biopsy and she is miserable, done on Wednesday, but has been having to do warm and hot compresses. She wants some type of pain med called in. PT  [id number].   [place], [place]  Phone:  [phone] Fax:  [phone]

## 2019-05-25 MED ORDER — HYDROCODONE-ACETAMINOPHEN 5-325 MG PO TABS: 1.0000 | ORAL_TABLET | Freq: Four times a day (QID) | ORAL | 0 refills | Status: DC | PRN

## 2019-05-25 NOTE — Telephone Encounter (Signed)
Ok for small rx of hydrocodone prn, but should report any fever, swelling or worsening pain to her biopsy provider, or make OV here if needed

## 2019-05-26 ENCOUNTER — Other Ambulatory Visit: Payer: Self-pay | Admitting: Internal Medicine

## 2019-05-26 ENCOUNTER — Other Ambulatory Visit: Payer: Self-pay

## 2019-05-26 ENCOUNTER — Ambulatory Visit
Admission: RE | Admit: 2019-05-26 | Discharge: 2019-05-26 | Disposition: A | Discharge status: Home / Self Care | Payer: Medicare Other | Source: Ambulatory Visit | Attending: Internal Medicine | Admitting: Internal Medicine

## 2019-05-26 DIAGNOSIS — N6489 Other specified disorders of breast: Secondary | ICD-10-CM | POA: Diagnosis not present

## 2019-05-26 DIAGNOSIS — N644 Mastodynia: Secondary | ICD-10-CM

## 2019-05-26 NOTE — Telephone Encounter (Signed)
Notified pt Rx Hydrocodone sent to [facility]

## 2019-05-28 DIAGNOSIS — I639 Cerebral infarction, unspecified: Secondary | ICD-10-CM

## 2019-05-28 HISTORY — DX: Cerebral infarction, unspecified: I63.9

## 2019-05-31 ENCOUNTER — Other Ambulatory Visit: Payer: Self-pay

## 2019-05-31 ENCOUNTER — Other Ambulatory Visit: Payer: Self-pay | Admitting: Internal Medicine

## 2019-05-31 ENCOUNTER — Ambulatory Visit
Admission: RE | Admit: 2019-05-31 | Discharge: 2019-05-31 | Disposition: A | Discharge status: Home / Self Care | Payer: Medicare Other | Source: Ambulatory Visit | Attending: Internal Medicine | Admitting: Internal Medicine

## 2019-05-31 DIAGNOSIS — R590 Localized enlarged lymph nodes: Secondary | ICD-10-CM | POA: Diagnosis not present

## 2019-05-31 DIAGNOSIS — N644 Mastodynia: Secondary | ICD-10-CM

## 2019-06-12 ENCOUNTER — Other Ambulatory Visit: Payer: Self-pay | Admitting: Internal Medicine

## 2019-06-12 NOTE — Telephone Encounter (Signed)
Please refill as per office routine med refill policy (all routine meds refilled for 3 mo or monthly per pt preference up to one year from last visit, then month to month grace period for 3 mo, then further med refills will have to be denied)  

## 2019-06-15 ENCOUNTER — Other Ambulatory Visit: Payer: Self-pay | Admitting: Internal Medicine

## 2019-06-15 ENCOUNTER — Other Ambulatory Visit: Payer: Medicare Other

## 2019-06-15 DIAGNOSIS — IMO0002 Reserved for concepts with insufficient information to code with codable children: Secondary | ICD-10-CM

## 2019-06-15 DIAGNOSIS — E1165 Type 2 diabetes mellitus with hyperglycemia: Secondary | ICD-10-CM

## 2019-06-30 ENCOUNTER — Other Ambulatory Visit: Payer: Self-pay

## 2019-06-30 ENCOUNTER — Other Ambulatory Visit: Payer: Self-pay | Admitting: Internal Medicine

## 2019-06-30 ENCOUNTER — Ambulatory Visit
Admission: RE | Admit: 2019-06-30 | Discharge: 2019-06-30 | Disposition: A | Discharge status: Home / Self Care | Payer: Medicare Other | Source: Ambulatory Visit | Attending: Internal Medicine | Admitting: Internal Medicine

## 2019-06-30 DIAGNOSIS — Z1231 Encounter for screening mammogram for malignant neoplasm of breast: Secondary | ICD-10-CM

## 2019-06-30 DIAGNOSIS — N644 Mastodynia: Secondary | ICD-10-CM

## 2019-06-30 DIAGNOSIS — R590 Localized enlarged lymph nodes: Secondary | ICD-10-CM | POA: Diagnosis not present

## 2019-08-09 ENCOUNTER — Other Ambulatory Visit: Payer: Self-pay | Admitting: Internal Medicine

## 2019-08-09 MED ORDER — ONETOUCH ULTRA 2 W/DEVICE KIT: 1.0000 | PACK | Freq: Two times a day (BID) | 0 refills | Status: AC

## 2019-08-09 MED ORDER — ONETOUCH ULTRA VI STRP: ORAL_STRIP | 12 refills | Status: DC

## 2019-08-09 MED ORDER — LANCETS MISC: 1.0000 | Freq: Two times a day (BID) | 11 refills | Status: DC

## 2019-08-17 ENCOUNTER — Ambulatory Visit (INDEPENDENT_AMBULATORY_CARE_PROVIDER_SITE_OTHER): Payer: Medicare Other | Admitting: Internal Medicine

## 2019-08-17 ENCOUNTER — Encounter: Payer: Self-pay | Admitting: Internal Medicine

## 2019-08-17 ENCOUNTER — Other Ambulatory Visit: Payer: Self-pay

## 2019-08-17 DIAGNOSIS — I10 Essential (primary) hypertension: Secondary | ICD-10-CM

## 2019-08-17 DIAGNOSIS — Z794 Long term (current) use of insulin: Secondary | ICD-10-CM

## 2019-08-17 DIAGNOSIS — E782 Mixed hyperlipidemia: Secondary | ICD-10-CM

## 2019-08-17 DIAGNOSIS — E1165 Type 2 diabetes mellitus with hyperglycemia: Secondary | ICD-10-CM

## 2019-08-17 MED ORDER — GLIMEPIRIDE 1 MG PO TABS: 1.0000 mg | ORAL_TABLET | Freq: Every day | ORAL | 3 refills | Status: DC

## 2019-08-17 NOTE — Patient Instructions (Signed)
Ok to take the lantus at 60 units per day  OK to decrease the glimeparide to 1 mg per day (and take early in the day with breakfast or lunch)  Please continue all other medications as before, and refills have been done if requested.  Please have the pharmacy call with any other refills you may need.  Please continue your efforts at being more active, low cholesterol diet, and weight control.  Please keep your appointments with your specialists as you may have planned

## 2019-08-17 NOTE — Progress Notes (Signed)
Subjective:    Patient ID: Tiffany [name], female    DOB: [birth year], 69 y.o.   MRN: [id number]  HPI  Here to f/u; overall doing ok,  Pt denies chest pain, increasing sob or doe, wheezing, orthopnea, PND, increased LE swelling, palpitations, dizziness or syncope.  Pt denies new neurological symptoms such as new headache, or facial or extremity weakness or numbness.  Pt denies polydipsia, polyuria, but has had several low sugar episodes often after midnight.  Pt states overall good compliance with meds, mostly trying to follow appropriate diet, with wt overall stable,  but little exercise however. Lost 8 lbs since last visit now with multiple later in the day sugars made worse with taking the amaryl later in the day as well. Wt Readings from Last 3 Encounters:  08/17/19 142 lb 12.8 oz (64.8 kg)  05/03/19 148 lb (67.1 kg)  04/16/19 150 lb (68 kg)  not taking zetia, nt sure why Past Medical History:  Diagnosis Date  . Degenerative joint disease   . Diabetes mellitus   . Headache   . Hyperlipidemia   . Hypertension   . Sleep apnea    stopped using it   Past Surgical History:  Procedure Laterality Date  . ABDOMINAL HYSTERECTOMY  1976  . BACK SURGERY    . KNEE ARTHROSCOPY     left x 2  . [place]    reports that she quit smoking about 29 years ago. She has a 7.50 pack-year smoking history. She has never used smokeless tobacco. She reports that she does not drink alcohol or use drugs. family history includes Diabetes in her mother and sister; Heart disease in her brother and mother; Hypertension in her brother and mother; Kidney disease in her mother; Stroke in her brother; Thyroid disease in her mother. Allergies  Allergen Reactions  . Aspirin Nausea And Vomiting    Can take EC asa  . Contrast Media [Iodinated Diagnostic Agents] Nausea And Vomiting and Swelling    Patients throat closes with nausea and vomiting   . Gadolinium Derivatives Nausea And Vomiting  .  Metformin And Related Nausea And Vomiting    Severe nausea and vomiting  . Shellfish Allergy Nausea And Vomiting  . Tramadol Nausea And Vomiting and Other (See Comments)    Migraine  . Penicillins   . Statins Other (See Comments)    myalgias   Current Outpatient Medications on File Prior to Visit  Medication Sig Dispense Refill  . amLODipine (NORVASC) 5 MG tablet Take 1 tablet (5 mg total) by mouth daily. 90 tablet 3  . Blood Glucose Monitoring Suppl (ONE TOUCH ULTRA 2) w/Device KIT Apply 1 Device topically in the morning and at bedtime. E11.9 1 kit 0  . ezetimibe (ZETIA) 10 MG tablet Take 1 tablet (10 mg total) by mouth daily. 90 tablet 3  . famotidine (PEPCID) 20 MG tablet Take 1 tablet (20 mg total) by mouth 2 (two) times daily. 180 tablet 3  . gabapentin (NEURONTIN) 400 MG capsule Take 1 capsule (400 mg total) by mouth 3 (three) times daily. 270 capsule 1  . glucose blood (ONETOUCH ULTRA) test strip Use as instructed twice daily E11.9 200 each 12  . HYDROcodone-acetaminophen (NORCO/VICODIN) 5-325 MG tablet Take 1 tablet by mouth every 6 (six) hours as needed. 15 tablet 0  . Insulin Glargine (LANTUS SOLOSTAR) 100 UNIT/ML Solostar Pen INJECT 55 UNITS INTO THE SKIN DAILY. 15 mL 1  . Insulin Pen Needle 32G X 4  MM MISC Use to inject  lantus once daily 100 each 4  . Lancets MISC Apply 1 Device topically in the morning and at bedtime. E11.9 200 each 11  . quinapril (ACCUPRIL) 40 MG tablet Take 1 tablet (40 mg total) by mouth daily. 90 tablet 3  . tiZANidine (ZANAFLEX) 4 MG tablet TAKE 1 TABLET BY MOUTH EVERY 8 HOURS AS NEEDED FOR MUSCLE SPASM 90 tablet 5  . vitamin B-12 (CYANOCOBALAMIN) 1000 MCG tablet Take 1 tablet (1,000 mcg total) by mouth daily. 90 tablet 3  . zolpidem (AMBIEN) 10 MG tablet Take 1 tablet (10 mg total) by mouth at bedtime as needed. 90 tablet 1   No current facility-administered medications on file prior to visit.   Review of Systems All otherwise neg per pt     Objective:   Physical Exam BP (!) 160/86   Pulse 90   Temp 98.3 F (36.8 C)   Ht _0  (1.575 m)   Wt 142 lb 12.8 oz (64.8 kg)   SpO2 99%   BMI 26.12 kg/m  VS noted,  Constitutional: Pt appears in NAD HENT: Head: NCAT.  Right Ear: External ear normal.  Left Ear: External ear normal.  Eyes: . Pupils are equal, round, and reactive to light. Conjunctivae and EOM are normal Nose: without d/c or deformity Neck: Neck supple. Gross normal ROM Cardiovascular: Normal rate and regular rhythm.   Pulmonary/Chest: Effort normal and breath sounds without rales or wheezing.  Abd:  Soft, NT, ND, + BS, no organomegaly Neurological: Pt is alert. At baseline orientation, motor grossly intact Skin: Skin is warm. No rashes, other new lesions, no LE edema Psychiatric: Pt behavior is normal without agitation  All otherwise neg per pt Lab Results  Component Value Date   WBC 3.7 (L) 04/12/2019   HGB 13.0 04/12/2019   HCT 39.7 04/12/2019   PLT 232.0 04/12/2019   GLUCOSE 133 (H) 04/12/2019   CHOL 224 (H) 04/12/2019   TRIG 63.0 04/12/2019   HDL 59.40 04/12/2019   LDLCALC 152 (H) 04/12/2019   ALT 15 04/12/2019   AST 15 04/12/2019   NA 139 04/12/2019   K 4.2 04/12/2019   CL 105 04/12/2019   CREATININE 0.75 04/12/2019   BUN 15 04/12/2019   CO2 25 04/12/2019   TSH 1.58 04/12/2019   HGBA1C 7.9 (H) 04/12/2019   MICROALBUR 0.8 04/12/2019      Assessment & Plan:

## 2019-08-21 ENCOUNTER — Other Ambulatory Visit: Payer: Self-pay | Admitting: Internal Medicine

## 2019-08-21 NOTE — Telephone Encounter (Signed)
Please refill as per office routine med refill policy (all routine meds refilled for 3 mo or monthly per pt preference up to one year from last visit, then month to month grace period for 3 mo, then further med refills will have to be denied)  

## 2019-08-22 ENCOUNTER — Encounter: Payer: Self-pay | Admitting: Internal Medicine

## 2019-08-22 NOTE — Assessment & Plan Note (Addendum)
Over controlled with better diet and wt loss recently, for decreased lantus to 60 u, and glimeparide 1 mg qhs  I spent 32 minutes in preparing to see the patient by review of recent labs, imaging and procedures, obtaining and reviewing separately obtained history, communicating with the patient and family or caregiver, ordering medications, tests or procedures, and documenting clinical information in the EHR including the differential Dx, treatment, and any further evaluation and other management of DM, HTN, hLD

## 2019-08-22 NOTE — Assessment & Plan Note (Signed)
Mild uncontrolled, pt declines further med changes for now, f/u bp at home and next visit

## 2019-08-22 NOTE — Assessment & Plan Note (Signed)
For better diet, restart zetia 10 qd

## 2019-10-12 ENCOUNTER — Telehealth: Payer: Self-pay

## 2019-10-12 NOTE — Telephone Encounter (Signed)
New message   [employer] calling  Has the patient been on a statin medication for CVA prevention due to a history of DM?

## 2019-10-14 ENCOUNTER — Ambulatory Visit
Admission: RE | Admit: 2019-10-14 | Discharge: 2019-10-14 | Disposition: A | Discharge status: Home / Self Care | Payer: Medicare Other | Source: Ambulatory Visit | Attending: Internal Medicine | Admitting: Internal Medicine

## 2019-10-14 DIAGNOSIS — Z1231 Encounter for screening mammogram for malignant neoplasm of breast: Secondary | ICD-10-CM | POA: Diagnosis not present

## 2019-10-28 ENCOUNTER — Other Ambulatory Visit: Payer: Self-pay | Admitting: Internal Medicine

## 2019-11-01 ENCOUNTER — Ambulatory Visit (INDEPENDENT_AMBULATORY_CARE_PROVIDER_SITE_OTHER): Payer: Medicare Other | Admitting: Internal Medicine

## 2019-11-01 ENCOUNTER — Other Ambulatory Visit: Payer: Self-pay | Admitting: Internal Medicine

## 2019-11-01 ENCOUNTER — Encounter: Payer: Self-pay | Admitting: Internal Medicine

## 2019-11-01 ENCOUNTER — Other Ambulatory Visit: Payer: Self-pay

## 2019-11-01 VITALS — BP 190/96 | HR 86 | Temp 98.2°F | Ht 62.0 in | Wt 154.0 lb

## 2019-11-01 DIAGNOSIS — Z794 Long term (current) use of insulin: Secondary | ICD-10-CM

## 2019-11-01 DIAGNOSIS — M25512 Pain in left shoulder: Secondary | ICD-10-CM | POA: Diagnosis not present

## 2019-11-01 DIAGNOSIS — I10 Essential (primary) hypertension: Secondary | ICD-10-CM | POA: Diagnosis not present

## 2019-11-01 DIAGNOSIS — Z0000 Encounter for general adult medical examination without abnormal findings: Secondary | ICD-10-CM

## 2019-11-01 DIAGNOSIS — E538 Deficiency of other specified B group vitamins: Secondary | ICD-10-CM

## 2019-11-01 DIAGNOSIS — E1165 Type 2 diabetes mellitus with hyperglycemia: Secondary | ICD-10-CM

## 2019-11-01 LAB — LIPID PANEL
Cholesterol: 219 mg/dL — ABNORMAL HIGH (ref 0–200)
HDL: 69 mg/dL (ref >39.00)
LDL Cholesterol: 141 mg/dL — ABNORMAL HIGH (ref 0–99)
NonHDL: 150.45
Total CHOL/HDL Ratio: 3
Triglycerides: 47 mg/dL (ref 0.0–149.0)
VLDL: 9.4 mg/dL (ref 0.0–40.0)

## 2019-11-01 LAB — BASIC METABOLIC PANEL
BUN: 13 mg/dL (ref 6–23)
CO2: 29 mEq/L (ref 19–32)
Calcium: 9.6 mg/dL (ref 8.4–10.5)
Chloride: 105 mEq/L (ref 96–112)
Creatinine, Ser: 0.59 mg/dL (ref 0.40–1.20)
GFR: 122.54 mL/min (ref >60.00)
Glucose, Bld: 70 mg/dL (ref 70–99)
Potassium: 4.2 mEq/L (ref 3.5–5.1)
Sodium: 139 mEq/L (ref 135–145)

## 2019-11-01 LAB — HEPATIC FUNCTION PANEL
ALT: 24 U/L (ref 0–35)
AST: 21 U/L (ref 0–37)
Albumin: 4.3 g/dL (ref 3.5–5.2)
Alkaline Phosphatase: 72 U/L (ref 39–117)
Bilirubin, Direct: 0.1 mg/dL (ref 0.0–0.3)
Total Bilirubin: 0.4 mg/dL (ref 0.2–1.2)
Total Protein: 8.3 g/dL (ref 6.0–8.3)

## 2019-11-01 LAB — HEMOGLOBIN A1C: Hgb A1c MFr Bld: 6.9 % — ABNORMAL HIGH (ref 4.6–6.5)

## 2019-11-01 LAB — VITAMIN B12: Vitamin B-12: 530 pg/mL (ref 211–911)

## 2019-11-01 MED ORDER — EPINEPHRINE 0.3 MG/0.3ML IJ SOAJ: 0.3000 mg | INTRAMUSCULAR | 1 refills | Status: DC | PRN

## 2019-11-01 NOTE — Patient Instructions (Addendum)
Ok to stop the amaryl  Please continue all other medications as before, and refills have been done if requested - the peipen  Please have the pharmacy call with any other refills you may need.  Please continue your efforts at being more active, low cholesterol diet, and weight control.  Please keep your appointments with your specialists as you may have planned  You will be contacted regarding the referral for: Dr Veverly Fells for the left shoulder  Please go to the LAB at the blood drawing area for the tests to be done  You will be contacted by phone if any changes need to be made immediately.  Otherwise, you will receive a letter about your results with an explanation, but please check with MyChart first.  Please remember to sign up for MyChart if you have not done so, as this will be important to you in the future with finding out test results, communicating by private email, and scheduling acute appointments online when needed.  Please make an Appointment to return in 6 months, or sooner if needed, also with Lab Appointment for testing done 3-5 days before at the [facility] (so this is for TWO appointments - please see the scheduling desk as you leave)

## 2019-11-01 NOTE — Progress Notes (Signed)
Subjective:    Patient ID: Tiffany [name], female    DOB: [birth year], 69 y.o.   MRN: [id number]   HPI  Here to f/u; overall doing ok,  Pt denies chest pain, increasing sob or doe, wheezing, orthopnea, PND, increased LE swelling, palpitations, dizziness or syncope.  Pt denies new neurological symptoms such as new headache, or facial or extremity weakness or numbness.  Pt denies polydipsia, polyuria, but has had multiple low sugar episode even with lowered glimeparide and lantus now at 55 units daily..  Pt states overall good compliance with meds, mostly trying to follow appropriate diet, with wt overall stable,  but little exercise however. Plans to see eye doctor soon.  Needs epipen refill. Has run out of bP meds for several days and plans to get refill.  Also c/o left shoulder pain, swelling similar to the right shoulder bursitis she has already been treated Past Medical History:  Diagnosis Date  . Degenerative joint disease   . Diabetes mellitus   . Headache   . Hyperlipidemia   . Hypertension   . Sleep apnea    stopped using it   Past Surgical History:  Procedure Laterality Date  . ABDOMINAL HYSTERECTOMY  1976  . BACK SURGERY    . KNEE ARTHROSCOPY     left x 2  . [place]    reports that she quit smoking about 29 years ago. She has a 7.50 pack-year smoking history. She has never used smokeless tobacco. She reports that she does not drink alcohol or use drugs. family history includes Diabetes in her mother and sister; Heart disease in her brother and mother; Hypertension in her brother and mother; Kidney disease in her mother; Stroke in her brother; Thyroid disease in her mother. Allergies  Allergen Reactions  . Aspirin Nausea And Vomiting    Can take EC asa  . Contrast Media [Iodinated Diagnostic Agents] Nausea And Vomiting and Swelling    Patients throat closes with nausea and vomiting   . Gadolinium Derivatives Nausea And Vomiting  . Metformin And Related  Nausea And Vomiting    Severe nausea and vomiting  . Shellfish Allergy Nausea And Vomiting  . Tramadol Nausea And Vomiting and Other (See Comments)    Migraine  . Penicillins   . Statins Other (See Comments)    myalgias   Current Outpatient Medications on File Prior to Visit  Medication Sig Dispense Refill  . amLODipine (NORVASC) 5 MG tablet Take 1 tablet (5 mg total) by mouth daily. 90 tablet 3  . Blood Glucose Monitoring Suppl (ONE TOUCH ULTRA 2) w/Device KIT Apply 1 Device topically in the morning and at bedtime. E11.9 1 kit 0  . ezetimibe (ZETIA) 10 MG tablet Take 1 tablet (10 mg total) by mouth daily. 90 tablet 3  . famotidine (PEPCID) 20 MG tablet Take 1 tablet (20 mg total) by mouth 2 (two) times daily. 180 tablet 3  . glucose blood (ONETOUCH ULTRA) test strip Use as instructed twice daily E11.9 200 each 12  . HYDROcodone-acetaminophen (NORCO/VICODIN) 5-325 MG tablet Take 1 tablet by mouth every 6 (six) hours as needed. 15 tablet 0  . Insulin Pen Needle 32G X 4 MM MISC Use to inject  lantus once daily 100 each 4  . Lancets MISC Apply 1 Device topically in the morning and at bedtime. E11.9 200 each 11  . LANTUS SOLOSTAR 100 UNIT/ML Solostar Pen INJECT 55 UNITS INTO THE SKIN DAILY. 15 mL 2  .  quinapril (ACCUPRIL) 40 MG tablet Take 1 tablet (40 mg total) by mouth daily. 90 tablet 3  . tiZANidine (ZANAFLEX) 4 MG tablet TAKE 1 TABLET BY MOUTH EVERY 8 HOURS AS NEEDED FOR MUSCLE SPASM 90 tablet 5  . vitamin B-12 (CYANOCOBALAMIN) 1000 MCG tablet Take 1 tablet (1,000 mcg total) by mouth daily. 90 tablet 3   No current facility-administered medications on file prior to visit.   Review of Systems All otherwise neg per pt     Objective:   Physical Exam BP (!) 190/96 (BP Location: Left Arm, Patient Position: Sitting, Cuff Size: Large)   Pulse 86   Temp 98.2 F (36.8 C) (Oral)   Ht 5' 2"  (1.575 m)   Wt 154 lb (69.9 kg)   SpO2 99%   BMI 28.17 kg/m  VS noted,  Constitutional: Pt  appears in NAD HENT: Head: NCAT.  Right Ear: External ear normal.  Left Ear: External ear normal.  Eyes: . Pupils are equal, round, and reactive to light. Conjunctivae and EOM are normal Nose: without d/c or deformity Neck: Neck supple. Gross normal ROM Cardiovascular: Normal rate and regular rhythm.   Pulmonary/Chest: Effort normal and breath sounds without rales or wheezing.  Left shoulder with tender swelling left subacromial Neurological: Pt is alert. At baseline orientation, motor grossly intact Skin: Skin is warm. No rashes, other new lesions, no LE edema Psychiatric: Pt behavior is normal without agitation  All otherwise neg per pt Lab Results  Component Value Date   WBC 3.7 (L) 04/12/2019   HGB 13.0 04/12/2019   HCT 39.7 04/12/2019   PLT 232.0 04/12/2019   GLUCOSE 70 11/01/2019   CHOL 219 (H) 11/01/2019   TRIG 47.0 11/01/2019   HDL 69.00 11/01/2019   LDLCALC 141 (H) 11/01/2019   ALT 24 11/01/2019   AST 21 11/01/2019   NA 139 11/01/2019   K 4.2 11/01/2019   CL 105 11/01/2019   CREATININE 0.59 11/01/2019   BUN 13 11/01/2019   CO2 29 11/01/2019   TSH 1.58 04/12/2019   HGBA1C 6.9 (H) 11/01/2019   MICROALBUR 0.8 04/12/2019      Assessment & Plan:

## 2019-11-01 NOTE — Assessment & Plan Note (Signed)
C/w bursitis, for referral orthopedic,  to f/u any worsening symptoms or concerns

## 2019-11-01 NOTE — Telephone Encounter (Signed)
Both done erx 

## 2019-11-01 NOTE — Assessment & Plan Note (Signed)
To restart bP meds,  to f/u any worsening symptoms or concerns

## 2019-11-01 NOTE — Assessment & Plan Note (Signed)
Cont oral replacement 

## 2019-11-01 NOTE — Assessment & Plan Note (Signed)
conts overcontrolled - to d/c amaryl, cont lantus at 55 units and cont chec cbgs, call for further lows

## 2019-11-08 ENCOUNTER — Telehealth: Payer: Self-pay | Admitting: Internal Medicine

## 2019-11-08 DIAGNOSIS — N8110 Cystocele, unspecified: Secondary | ICD-10-CM

## 2019-11-08 NOTE — Telephone Encounter (Signed)
New message:    Pt is calling and would like to get her lab results. Please advise.

## 2019-11-09 NOTE — Telephone Encounter (Signed)
Spoke with pt and informed her of her labs. Pt states she understands and has no questions or concerns.

## 2019-11-09 NOTE — Telephone Encounter (Signed)
Ok referral done urology

## 2020-01-23 ENCOUNTER — Other Ambulatory Visit: Payer: Self-pay | Admitting: Internal Medicine

## 2020-01-23 NOTE — Telephone Encounter (Signed)
Please refill as per office routine med refill policy (all routine meds refilled for 3 mo or monthly per pt preference up to one year from last visit, then month to month grace period for 3 mo, then further med refills will have to be denied)  

## 2020-02-02 DIAGNOSIS — N8111 Cystocele, midline: Secondary | ICD-10-CM | POA: Diagnosis not present

## 2020-02-02 DIAGNOSIS — R350 Frequency of micturition: Secondary | ICD-10-CM | POA: Diagnosis not present

## 2020-02-02 DIAGNOSIS — R351 Nocturia: Secondary | ICD-10-CM | POA: Diagnosis not present

## 2020-04-14 ENCOUNTER — Other Ambulatory Visit: Payer: Self-pay | Admitting: Internal Medicine

## 2020-04-14 NOTE — Telephone Encounter (Signed)
Please refill as per office routine med refill policy (all routine meds refilled for 3 mo or monthly per pt preference up to one year from last visit, then month to month grace period for 3 mo, then further med refills will have to be denied)  

## 2020-04-26 ENCOUNTER — Other Ambulatory Visit (INDEPENDENT_AMBULATORY_CARE_PROVIDER_SITE_OTHER): Payer: Medicare Other

## 2020-04-26 ENCOUNTER — Other Ambulatory Visit: Payer: Self-pay

## 2020-04-26 ENCOUNTER — Other Ambulatory Visit: Payer: Self-pay | Admitting: Internal Medicine

## 2020-04-26 DIAGNOSIS — E559 Vitamin D deficiency, unspecified: Secondary | ICD-10-CM | POA: Diagnosis not present

## 2020-04-26 DIAGNOSIS — Z794 Long term (current) use of insulin: Secondary | ICD-10-CM | POA: Diagnosis not present

## 2020-04-26 DIAGNOSIS — I10 Essential (primary) hypertension: Secondary | ICD-10-CM

## 2020-04-26 DIAGNOSIS — E538 Deficiency of other specified B group vitamins: Secondary | ICD-10-CM | POA: Diagnosis not present

## 2020-04-26 DIAGNOSIS — E1165 Type 2 diabetes mellitus with hyperglycemia: Secondary | ICD-10-CM

## 2020-04-26 LAB — LIPID PANEL
Cholesterol: 205 mg/dL — ABNORMAL HIGH (ref 0–200)
HDL: 64.7 mg/dL (ref >39.00)
LDL Cholesterol: 130 mg/dL — ABNORMAL HIGH (ref 0–99)
NonHDL: 140.28
Total CHOL/HDL Ratio: 3
Triglycerides: 52 mg/dL (ref 0.0–149.0)
VLDL: 10.4 mg/dL (ref 0.0–40.0)

## 2020-04-26 LAB — VITAMIN B12: Vitamin B-12: 769 pg/mL (ref 211–911)

## 2020-04-26 LAB — CBC WITH DIFFERENTIAL/PLATELET
Basophils Absolute: 0 10*3/uL (ref 0.0–0.1)
Basophils Relative: 0.7 % (ref 0.0–3.0)
Eosinophils Absolute: 0 10*3/uL (ref 0.0–0.7)
Eosinophils Relative: 0.8 % (ref 0.0–5.0)
HCT: 40.5 % (ref 36.0–46.0)
Hemoglobin: 13.4 g/dL (ref 12.0–15.0)
Lymphocytes Relative: 16.6 % (ref 12.0–46.0)
Lymphs Abs: 0.7 10*3/uL (ref 0.7–4.0)
MCHC: 33.1 g/dL (ref 30.0–36.0)
MCV: 88.7 fl (ref 78.0–100.0)
Monocytes Absolute: 0.3 10*3/uL (ref 0.1–1.0)
Monocytes Relative: 7.1 % (ref 3.0–12.0)
Neutro Abs: 3.2 10*3/uL (ref 1.4–7.7)
Neutrophils Relative %: 74.8 % (ref 43.0–77.0)
Platelets: 230 10*3/uL (ref 150.0–400.0)
RBC: 4.57 Mil/uL (ref 3.87–5.11)
RDW: 13.8 % (ref 11.5–15.5)
WBC: 4.3 10*3/uL (ref 4.0–10.5)

## 2020-04-26 LAB — HEPATIC FUNCTION PANEL
ALT: 25 U/L (ref 0–35)
AST: 19 U/L (ref 0–37)
Albumin: 4.3 g/dL (ref 3.5–5.2)
Alkaline Phosphatase: 64 U/L (ref 39–117)
Bilirubin, Direct: 0.1 mg/dL (ref 0.0–0.3)
Total Bilirubin: 0.5 mg/dL (ref 0.2–1.2)
Total Protein: 8.5 g/dL — ABNORMAL HIGH (ref 6.0–8.3)

## 2020-04-26 LAB — URINALYSIS, ROUTINE W REFLEX MICROSCOPIC
Bilirubin Urine: NEGATIVE
Hgb urine dipstick: NEGATIVE
Leukocytes,Ua: NEGATIVE
Nitrite: NEGATIVE
RBC / HPF: NONE SEEN (ref 0–?)
Specific Gravity, Urine: 1.025 (ref 1.000–1.030)
Total Protein, Urine: NEGATIVE
Urine Glucose: NEGATIVE
Urobilinogen, UA: 0.2 (ref 0.0–1.0)
pH: 6 (ref 5.0–8.0)

## 2020-04-26 LAB — MICROALBUMIN / CREATININE URINE RATIO
Creatinine,U: 87 mg/dL
Microalb Creat Ratio: 1.4 mg/g (ref 0.0–30.0)
Microalb, Ur: 1.2 mg/dL (ref 0.0–1.9)

## 2020-04-26 LAB — VITAMIN D 25 HYDROXY (VIT D DEFICIENCY, FRACTURES): VITD: 41.19 ng/mL (ref 30.00–100.00)

## 2020-04-26 LAB — BASIC METABOLIC PANEL
BUN: 12 mg/dL (ref 6–23)
CO2: 28 mEq/L (ref 19–32)
Calcium: 9.6 mg/dL (ref 8.4–10.5)
Chloride: 103 mEq/L (ref 96–112)
Creatinine, Ser: 0.71 mg/dL (ref 0.40–1.20)
GFR: 87.14 mL/min (ref >60.00)
Glucose, Bld: 98 mg/dL (ref 70–99)
Potassium: 4.4 mEq/L (ref 3.5–5.1)
Sodium: 139 mEq/L (ref 135–145)

## 2020-04-26 LAB — HEMOGLOBIN A1C: Hgb A1c MFr Bld: 6.8 % — ABNORMAL HIGH (ref 4.6–6.5)

## 2020-04-26 LAB — TSH: TSH: 1.41 u[IU]/mL (ref 0.35–4.50)

## 2020-05-02 ENCOUNTER — Other Ambulatory Visit: Payer: Self-pay

## 2020-05-02 ENCOUNTER — Ambulatory Visit (INDEPENDENT_AMBULATORY_CARE_PROVIDER_SITE_OTHER): Payer: Medicare Other | Admitting: Internal Medicine

## 2020-05-02 ENCOUNTER — Encounter: Payer: Self-pay | Admitting: Internal Medicine

## 2020-05-02 VITALS — BP 140/82 | HR 86 | Temp 98.4°F | Ht 62.0 in | Wt 158.0 lb

## 2020-05-02 DIAGNOSIS — E782 Mixed hyperlipidemia: Secondary | ICD-10-CM

## 2020-05-02 DIAGNOSIS — E559 Vitamin D deficiency, unspecified: Secondary | ICD-10-CM

## 2020-05-02 DIAGNOSIS — G4709 Other insomnia: Secondary | ICD-10-CM

## 2020-05-02 DIAGNOSIS — Z23 Encounter for immunization: Secondary | ICD-10-CM

## 2020-05-02 DIAGNOSIS — I10 Essential (primary) hypertension: Secondary | ICD-10-CM

## 2020-05-02 DIAGNOSIS — E1165 Type 2 diabetes mellitus with hyperglycemia: Secondary | ICD-10-CM

## 2020-05-02 DIAGNOSIS — J309 Allergic rhinitis, unspecified: Secondary | ICD-10-CM | POA: Diagnosis not present

## 2020-05-02 DIAGNOSIS — M545 Low back pain, unspecified: Secondary | ICD-10-CM

## 2020-05-02 DIAGNOSIS — Z0001 Encounter for general adult medical examination with abnormal findings: Secondary | ICD-10-CM

## 2020-05-02 DIAGNOSIS — Z794 Long term (current) use of insulin: Secondary | ICD-10-CM

## 2020-05-02 DIAGNOSIS — G8929 Other chronic pain: Secondary | ICD-10-CM

## 2020-05-02 DIAGNOSIS — Z0000 Encounter for general adult medical examination without abnormal findings: Secondary | ICD-10-CM

## 2020-05-02 DIAGNOSIS — M25562 Pain in left knee: Secondary | ICD-10-CM | POA: Diagnosis not present

## 2020-05-02 DIAGNOSIS — T466X5A Adverse effect of antihyperlipidemic and antiarteriosclerotic drugs, initial encounter: Secondary | ICD-10-CM

## 2020-05-02 DIAGNOSIS — G720 Drug-induced myopathy: Secondary | ICD-10-CM | POA: Diagnosis not present

## 2020-05-02 MED ORDER — ZOLPIDEM TARTRATE 10 MG PO TABS
10.0000 mg | ORAL_TABLET | Freq: Every evening | ORAL | 1 refills | Status: DC | PRN
Start: 2020-05-02 — End: 2021-04-09

## 2020-05-02 MED ORDER — TIZANIDINE HCL 4 MG PO TABS
ORAL_TABLET | ORAL | 5 refills | Status: DC
Start: 2020-05-02 — End: 2021-04-09

## 2020-05-02 MED ORDER — TRIAMCINOLONE ACETONIDE 55 MCG/ACT NA AERO: 2.0000 | INHALATION_SPRAY | Freq: Every day | NASAL | 3 refills | Status: AC

## 2020-05-02 NOTE — Progress Notes (Signed)
Subjective:    Patient ID: [name], female    DOB: 23-Feb-1951, 69 y.o.   MRN: [id number]  HPI  Here for wellness and f/u;  Overall doing ok;  Pt denies Chest pain, worsening SOB, DOE, wheezing, orthopnea, PND, worsening LE edema, palpitations, dizziness or syncope.  Pt denies neurological change such as new headache, facial or extremity weakness.  Pt denies polydipsia, polyuria, or low sugar symptoms. Pt states overall good compliance with treatment and medications, good tolerability, and has been trying to follow appropriate diet.  Pt denies worsening depressive symptoms, suicidal ideation or panic. No fever, night sweats, wt loss, loss of appetite, or other constitutional symptoms.  Pt states good ability with ADL's, has low fall risk, home safety reviewed and adequate, no other significant changes in hearing or vision, and only occasionally active with exercise. Has optho appt for jan 4.   Does have several wks ongoing nasal allergy symptoms with clearish congestion, itch and sneezing, without fever, pain, ST, cough, swelling or wheezing.  Pt continues to have recurring LBP without change in severity, bowel or bladder change, fever, wt loss,  worsening LE pain/numbness/weakness, gait change or falls.  Also has chronic persistent left knee pain djd severe - needs sugury but holding off for now.  Has ongoing insomnia as well, hard to sleep with pain Past Medical History:  Diagnosis Date  . Degenerative joint disease   . Diabetes mellitus   . Headache   . Hyperlipidemia   . Hypertension   . Sleep apnea    stopped using it   Past Surgical History:  Procedure Laterality Date  . ABDOMINAL HYSTERECTOMY  1976  . BACK SURGERY    . KNEE ARTHROSCOPY     left x 2  . [place]    reports that she quit smoking about 29 years ago. She has a 7.50 pack-year smoking history. She has never used smokeless tobacco. She reports that she does not drink alcohol and does not use  drugs. family history includes Diabetes in her mother and sister; Heart disease in her brother and mother; Hypertension in her brother and mother; Kidney disease in her mother; Stroke in her brother; Thyroid disease in her mother. Allergies  Allergen Reactions  . Aspirin Nausea And Vomiting    Can take EC asa  . Contrast Media [Iodinated Diagnostic Agents] Nausea And Vomiting and Swelling    Patients throat closes with nausea and vomiting   . Gadolinium Derivatives Nausea And Vomiting  . Metformin And Related Nausea And Vomiting    Severe nausea and vomiting  . Shellfish Allergy Nausea And Vomiting  . Tramadol Nausea And Vomiting and Other (See Comments)    Migraine  . Penicillins   . Statins Other (See Comments)    myalgias   Current Outpatient Medications on File Prior to Visit  Medication Sig Dispense Refill  . amLODipine (NORVASC) 5 MG tablet Take 1 tablet (5 mg total) by mouth daily. 90 tablet 3  . Blood Glucose Monitoring Suppl (ONE TOUCH ULTRA 2) w/Device KIT Apply 1 Device topically in the morning and at bedtime. E11.9 1 kit 0  . EPINEPHrine 0.3 mg/0.3 mL IJ SOAJ injection Inject 0.3 mLs (0.3 mg total) into the muscle as needed for anaphylaxis. 2 each 1  . estradiol (ESTRACE) 0.1 MG/GM vaginal cream Place vaginally.    Marland Kitchen ezetimibe (ZETIA) 10 MG tablet Take 1 tablet (10 mg total) by mouth daily. 90 tablet 3  . famotidine (PEPCID)  20 MG tablet Take 1 tablet (20 mg total) by mouth 2 (two) times daily. 180 tablet 3  . gabapentin (NEURONTIN) 400 MG capsule TAKE 1 CAPSULE BY MOUTH THREE TIMES DAILY 270 capsule 1  . glucose blood (ONETOUCH ULTRA) test strip Use as instructed twice daily E11.9 200 each 12  . HYDROcodone-acetaminophen (NORCO/VICODIN) 5-325 MG tablet Take 1 tablet by mouth every 6 (six) hours as needed. 15 tablet 0  . Insulin Pen Needle 32G X 4 MM MISC Use to inject  lantus once daily 100 each 4  . Lancets MISC Apply 1 Device topically in the morning and at bedtime. E11.9  200 each 11  . LANTUS SOLOSTAR 100 UNIT/ML Solostar Pen INJECT 55 UNITS INTO THE SKIN DAILY. 15 mL 2  . quinapril (ACCUPRIL) 40 MG tablet Take 1 tablet (40 mg total) by mouth daily. 90 tablet 3  . vitamin B-12 (CYANOCOBALAMIN) 1000 MCG tablet Take 1 tablet (1,000 mcg total) by mouth daily. 90 tablet 3   No current facility-administered medications on file prior to visit.   Review of Systems All otherwise neg per pt    Objective:   Physical Exam BP 140/82 (BP Location: Left Arm, Patient Position: Sitting, Cuff Size: Large)   Pulse 86   Temp 98.4 F (36.9 C) (Oral)   Ht _0  (1.575 m)   Wt 158 lb (71.7 kg)   SpO2 99%   BMI 28.90 kg/m  VS noted,  Constitutional: Pt appears in NAD HENT: Head: NCAT.  Right Ear: External ear normal.  Left Ear: External ear normal.  Eyes: . Pupils are equal, round, and reactive to light. Conjunctivae and EOM are normal Nose: without d/c or deformity Neck: Neck supple. Gross normal ROM Cardiovascular: Normal rate and regular rhythm.   Pulmonary/Chest: Effort normal and breath sounds without rales or wheezing.  Abd:  Soft, NT, ND, + BS, no organomegaly Neurological: Pt is alert. At baseline orientation, motor grossly intact Skin: Skin is warm. No rashes, other new lesions, no LE edema Psychiatric: Pt behavior is normal without agitation  All otherwise neg per pt Lab Results  Component Value Date   WBC 4.3 04/26/2020   HGB 13.4 04/26/2020   HCT 40.5 04/26/2020   PLT 230.0 04/26/2020   GLUCOSE 98 04/26/2020   CHOL 205 (H) 04/26/2020   TRIG 52.0 04/26/2020   HDL 64.70 04/26/2020   LDLCALC 130 (H) 04/26/2020   ALT 25 04/26/2020   AST 19 04/26/2020   NA 139 04/26/2020   K 4.4 04/26/2020   CL 103 04/26/2020   CREATININE 0.71 04/26/2020   BUN 12 04/26/2020   CO2 28 04/26/2020   TSH 1.41 04/26/2020   HGBA1C 6.8 (H) 04/26/2020   MICROALBUR 1.2 04/26/2020      Assessment & Plan:

## 2020-05-02 NOTE — Patient Instructions (Signed)
You had the flu shot today  Please take all new medication as prescribed - the nasacort for allergies  Please remember to have your Booster shot soon  Please continue all other medications as before, and refills have been done if requested.  Please have the pharmacy call with any other refills you may need.  Please continue your efforts at being more active, low cholesterol diet, and weight control.  You are otherwise up to date with prevention measures today.  Please keep your appointments with your specialists as you may have planned  Please make an Appointment to return in 6 months, or sooner if needed

## 2020-05-06 ENCOUNTER — Encounter: Payer: Self-pay | Admitting: Internal Medicine

## 2020-05-06 DIAGNOSIS — Z0001 Encounter for general adult medical examination with abnormal findings: Secondary | ICD-10-CM | POA: Insufficient documentation

## 2020-05-06 DIAGNOSIS — J309 Allergic rhinitis, unspecified: Secondary | ICD-10-CM | POA: Insufficient documentation

## 2020-05-06 NOTE — Assessment & Plan Note (Signed)
Chronic persistent, to f/u ortho as planned

## 2020-05-06 NOTE — Assessment & Plan Note (Signed)
To continue oral replacement

## 2020-05-06 NOTE — Assessment & Plan Note (Signed)

## 2020-05-06 NOTE — Assessment & Plan Note (Addendum)
Mild, for add nasacort asd,  to f/u any worsening symptoms or concerns  I spent 41 minutes in addition to time for CPX wellness examination in preparing to see the patient by review of recent labs, imaging and procedures, obtaining and reviewing separately obtained history, communicating with the patient and family or caregiver, ordering medications, tests or procedures, and documenting clinical information in the EHR including the differential Dx, treatment, and any further evaluation and other management of allergies, chronic lbp, vit d def, statin myopathy, hld, htn, dm, insomnia, left knee pain

## 2020-05-06 NOTE — Assessment & Plan Note (Signed)
For add flexeril 5 tid prn,  to f/u any worsening symptoms or concerns

## 2020-05-06 NOTE — Assessment & Plan Note (Signed)
stable overall by history and exam, recent data reviewed with pt, and pt to continue medical treatment as before,  to f/u any worsening symptoms or concerns  

## 2020-05-06 NOTE — Assessment & Plan Note (Signed)
stable overall by history and exam, recent data reviewed with pt, and pt to continue medical treatment as before,  to f/u any worsening symptoms or concerns, for lower chol diet

## 2020-05-06 NOTE — Assessment & Plan Note (Signed)
Statin intolerant 

## 2020-05-06 NOTE — Assessment & Plan Note (Signed)
For ambien qhs prn

## 2020-05-08 ENCOUNTER — Telehealth: Payer: Self-pay | Admitting: Internal Medicine

## 2020-05-08 ENCOUNTER — Other Ambulatory Visit: Payer: Self-pay

## 2020-05-08 MED ORDER — LANCETS MISC: 1.0000 | Freq: Two times a day (BID) | 11 refills | Status: DC

## 2020-05-08 NOTE — Telephone Encounter (Signed)
1.Medication Requested: Soft Click Lancets, OneTouch Delica Plus Lancing Device   2. Pharmacy (Name, [place], [facility]):  [place], [place] - [address]  3. On Med List: No   4. Last Visit with PCP: 12.7.2021   5. Next visit date with PCP: 6.7.2022   Agent: Please be advised that RX refills may take up to 3 business days. We ask that you follow-up with your pharmacy.

## 2020-05-30 DIAGNOSIS — E119 Type 2 diabetes mellitus without complications: Secondary | ICD-10-CM | POA: Diagnosis not present

## 2020-05-30 DIAGNOSIS — H25813 Combined forms of age-related cataract, bilateral: Secondary | ICD-10-CM | POA: Diagnosis not present

## 2020-05-31 ENCOUNTER — Other Ambulatory Visit: Payer: Self-pay | Admitting: *Deleted

## 2020-05-31 MED ORDER — FAMOTIDINE 20 MG PO TABS
20.0000 mg | ORAL_TABLET | Freq: Two times a day (BID) | ORAL | 3 refills | Status: DC
Start: 2020-05-31 — End: 2021-03-21

## 2020-06-22 DIAGNOSIS — R350 Frequency of micturition: Secondary | ICD-10-CM | POA: Diagnosis not present

## 2020-06-22 DIAGNOSIS — N8111 Cystocele, midline: Secondary | ICD-10-CM | POA: Diagnosis not present

## 2020-07-11 ENCOUNTER — Other Ambulatory Visit: Payer: Self-pay | Admitting: Internal Medicine

## 2020-07-11 NOTE — Telephone Encounter (Signed)
Please refill as per office routine med refill policy (all routine meds refilled for 3 mo or monthly per pt preference up to one year from last visit, then month to month grace period for 3 mo, then further med refills will have to be denied)  

## 2020-07-13 ENCOUNTER — Other Ambulatory Visit: Payer: Self-pay | Admitting: Internal Medicine

## 2020-07-13 DIAGNOSIS — I10 Essential (primary) hypertension: Secondary | ICD-10-CM

## 2020-07-13 NOTE — Telephone Encounter (Signed)
Please refill as per office routine med refill policy (all routine meds refilled for 3 mo or monthly per pt preference up to one year from last visit, then month to month grace period for 3 mo, then further med refills will have to be denied)  

## 2020-07-18 ENCOUNTER — Other Ambulatory Visit: Payer: Self-pay | Admitting: Internal Medicine

## 2020-07-20 ENCOUNTER — Ambulatory Visit (INDEPENDENT_AMBULATORY_CARE_PROVIDER_SITE_OTHER): Payer: Medicare Other

## 2020-07-20 ENCOUNTER — Other Ambulatory Visit: Payer: Self-pay

## 2020-07-20 VITALS — BP 140/70 | HR 92 | Temp 97.9°F | Ht 62.0 in | Wt 157.8 lb

## 2020-07-20 DIAGNOSIS — Z0000 Encounter for general adult medical examination without abnormal findings: Secondary | ICD-10-CM | POA: Diagnosis not present

## 2020-07-20 NOTE — Patient Instructions (Signed)
Tiffany [name] , Thank you for taking time to come for your Medicare Wellness Visit. I appreciate your ongoing commitment to your health goals. Please review the following plan we discussed and let me know if I can assist you in the future.   Screening recommendations/referrals: Colonoscopy: 07/22/2012; due every 10 years Mammogram: 10/14/2019; due every year Bone Density: 04/03/2017; due every 2 years Recommended yearly ophthalmology/optometry visit for glaucoma screening and checkup Recommended yearly dental visit for hygiene and checkup  Vaccinations: Influenza vaccine: 05/02/2020 Pneumococcal vaccine: 03/06/2016, 03/03/2019 Tdap vaccine: 05/03/2019; due every 10 years  Shingles vaccine: never done; can check with local pharmacy for eligibility and price.   Covid-19: 08/09/2019, 08/31/2019, 05/23/2020  Advanced directives: Advance directive discussed with you today. Even though you declined this today please call our office should you change your mind and we can give you the proper paperwork for you to fill out.  Conditions/risks identified: Yes; Reviewed health maintenance screenings with patient today and relevant education, vaccines, and/or referrals were provided. Please continue to do your personal lifestyle choices by: daily care of teeth and gums, regular physical activity (goal should be 5 days a week for 30 minutes), eat a healthy diet, avoid tobacco and drug use, limiting any alcohol intake, taking a low-dose aspirin (if not allergic or have been advised by your provider otherwise) and taking vitamins and minerals as recommended by your provider. Continue doing brain stimulating activities (puzzles, reading, adult coloring books, staying active) to keep memory sharp. Continue to eat heart healthy diet (full of fruits, vegetables, whole grains, lean protein, water--limit salt, fat, and sugar intake) and increase physical activity as tolerated.  Next appointment: Please schedule your next Medicare  Wellness Visit with your Nurse Health Advisor in 1 year by calling [phone].  Preventive Care 43 Years and Older, Female Preventive care refers to lifestyle choices and visits with your health care provider that can promote health and wellness. What does preventive care include?  A yearly physical exam. This is also called an annual well check.  Dental exams once or twice a year.  Routine eye exams. Ask your health care provider how often you should have your eyes checked.  Personal lifestyle choices, including:  Daily care of your teeth and gums.  Regular physical activity.  Eating a healthy diet.  Avoiding tobacco and drug use.  Limiting alcohol use.  Practicing safe sex.  Taking low-dose aspirin every day.  Taking vitamin and mineral supplements as recommended by your health care provider. What happens during an annual well check? The services and screenings done by your health care provider during your annual well check will depend on your age, overall health, lifestyle risk factors, and family history of disease. Counseling  Your health care provider may ask you questions about your:  Alcohol use.  Tobacco use.  Drug use.  Emotional well-being.  Home and relationship well-being.  Sexual activity.  Eating habits.  History of falls.  Memory and ability to understand (cognition).  Work and work Statistician.  Reproductive health. Screening  You may have the following tests or measurements:  Height, weight, and BMI.  Blood pressure.  Lipid and cholesterol levels. These may be checked every 5 years, or more frequently if you are over 60 years old.  Skin check.  Lung cancer screening. You may have this screening every year starting at age 46 if you have a 30-pack-year history of smoking and currently smoke or have quit within the past 15 years.  Fecal occult  blood test (FOBT) of the stool. You may have this test every year starting at age  55.  Flexible sigmoidoscopy or colonoscopy. You may have a sigmoidoscopy every 5 years or a colonoscopy every 10 years starting at age 105.  Hepatitis C blood test.  Hepatitis B blood test.  Sexually transmitted disease (STD) testing.  Diabetes screening. This is done by checking your blood sugar (glucose) after you have not eaten for a while (fasting). You may have this done every 1-3 years.  Bone density scan. This is done to screen for osteoporosis. You may have this done starting at age 48.  Mammogram. This may be done every 1-2 years. Talk to your health care provider about how often you should have regular mammograms. Talk with your health care provider about your test results, treatment options, and if necessary, the need for more tests. Vaccines  Your health care provider may recommend certain vaccines, such as:  Influenza vaccine. This is recommended every year.  Tetanus, diphtheria, and acellular pertussis (Tdap, Td) vaccine. You may need a Td booster every 10 years.  Zoster vaccine. You may need this after age 26.  Pneumococcal 13-valent conjugate (PCV13) vaccine. One dose is recommended after age 22.  Pneumococcal polysaccharide (PPSV23) vaccine. One dose is recommended after age 67. Talk to your health care provider about which screenings and vaccines you need and how often you need them. This information is not intended to replace advice given to you by your health care provider. Make sure you discuss any questions you have with your health care provider. Document Released: 06/09/2015 Document Revised: 01/31/2016 Document Reviewed: 03/14/2015 Elsevier Interactive Patient Education  2017 [employer] Prevention in the Home Falls can cause injuries. They can happen to people of all ages. There are many things you can do to make your home safe and to help prevent falls. What can I do on the outside of my home?  Regularly fix the edges of walkways and driveways  and fix any cracks.  Remove anything that might make you trip as you walk through a door, such as a raised step or threshold.  Trim any bushes or trees on the path to your home.  Use bright outdoor lighting.  Clear any walking paths of anything that might make someone trip, such as rocks or tools.  Regularly check to see if handrails are loose or broken. Make sure that both sides of any steps have handrails.  Any raised decks and porches should have guardrails on the edges.  Have any leaves, snow, or ice cleared regularly.  Use sand or salt on walking paths during winter.  Clean up any spills in your garage right away. This includes oil or grease spills. What can I do in the bathroom?  Use night lights.  Install grab bars by the toilet and in the tub and shower. Do not use towel bars as grab bars.  Use non-skid mats or decals in the tub or shower.  If you need to sit down in the shower, use a plastic, non-slip stool.  Keep the floor dry. Clean up any water that spills on the floor as soon as it happens.  Remove soap buildup in the tub or shower regularly.  Attach bath mats securely with double-sided non-slip rug tape.  Do not have throw rugs and other things on the floor that can make you trip. What can I do in the bedroom?  Use night lights.  Make sure that you have  a light by your bed that is easy to reach.  Do not use any sheets or blankets that are too big for your bed. They should not hang down onto the floor.  Have a firm chair that has side arms. You can use this for support while you get dressed.  Do not have throw rugs and other things on the floor that can make you trip. What can I do in the kitchen?  Clean up any spills right away.  Avoid walking on wet floors.  Keep items that you use a lot in easy-to-reach places.  If you need to reach something above you, use a strong step stool that has a grab bar.  Keep electrical cords out of the way.  Do  not use floor polish or wax that makes floors slippery. If you must use wax, use non-skid floor wax.  Do not have throw rugs and other things on the floor that can make you trip. What can I do with my stairs?  Do not leave any items on the stairs.  Make sure that there are handrails on both sides of the stairs and use them. Fix handrails that are broken or loose. Make sure that handrails are as long as the stairways.  Check any carpeting to make sure that it is firmly attached to the stairs. Fix any carpet that is loose or worn.  Avoid having throw rugs at the top or bottom of the stairs. If you do have throw rugs, attach them to the floor with carpet tape.  Make sure that you have a light switch at the top of the stairs and the bottom of the stairs. If you do not have them, ask someone to add them for you. What else can I do to help prevent falls?  Wear shoes that:  Do not have high heels.  Have rubber bottoms.  Are comfortable and fit you well.  Are closed at the toe. Do not wear sandals.  If you use a stepladder:  Make sure that it is fully opened. Do not climb a closed stepladder.  Make sure that both sides of the stepladder are locked into place.  Ask someone to hold it for you, if possible.  Clearly mark and make sure that you can see:  Any grab bars or handrails.  First and last steps.  Where the edge of each step is.  Use tools that help you move around (mobility aids) if they are needed. These include:  Canes.  Walkers.  Scooters.  Crutches.  Turn on the lights when you go into a dark area. Replace any light bulbs as soon as they burn out.  Set up your furniture so you have a clear path. Avoid moving your furniture around.  If any of your floors are uneven, fix them.  If there are any pets around you, be aware of where they are.  Review your medicines with your doctor. Some medicines can make you feel dizzy. This can increase your chance of  falling. Ask your doctor what other things that you can do to help prevent falls. This information is not intended to replace advice given to you by your health care provider. Make sure you discuss any questions you have with your health care provider. Document Released: 03/09/2009 Document Revised: 10/19/2015 Document Reviewed: 06/17/2014 Elsevier Interactive Patient Education  2017 [employer].

## 2020-07-20 NOTE — Progress Notes (Signed)
Subjective:   Tiffany [name] is a 70 y.o. female who presents for Medicare Annual (Subsequent) preventive examination.  Review of Systems    No ROS. Medicare Wellness Visit. Additional risk factors are reflected in social history. Cardiac Risk Factors include: advanced age (>1mn, >>42women);diabetes mellitus;dyslipidemia;hypertension;family history of premature cardiovascular disease     Objective:    Today's Vitals   07/20/20 1531  BP: 140/70  Pulse: 92  Temp: 97.9 F (36.6 C)  SpO2: 99%  Weight: 157 lb 12.8 oz (71.6 kg)  Height: _0  (1.575 m)  PainSc: 10-Worst pain ever  PainLoc: Leg   Body mass index is 28.86 kg/m.  Advanced Directives 07/20/2020 04/16/2019 03/10/2017 10/06/2015 10/02/2015 07/03/2015 06/15/2015  Does Patient Have a Medical Advance Directive? _1  No No  Would patient like information on creating a medical advance directive? No - Patient declined Yes (ED - Information included in AVS) Yes (ED - Information included in AVS) - - No - patient declined information Yes - Educational materials given    Current Medications (verified) Outpatient Encounter Medications as of 07/20/2020  Medication Sig  . conjugated estrogens (PREMARIN) vaginal cream Place 1 Applicatorful vaginally daily. 3 days a week  . amLODipine (NORVASC) 5 MG tablet TAKE 1 TABLET BY MOUTH EVERY DAY  . Blood Glucose Monitoring Suppl (ONE TOUCH ULTRA 2) w/Device KIT Apply 1 Device topically in the morning and at bedtime. E11.9  . EPINEPHrine 0.3 mg/0.3 mL IJ SOAJ injection Inject 0.3 mLs (0.3 mg total) into the muscle as needed for anaphylaxis.  .Marland Kitchenestradiol (ESTRACE) 0.1 MG/GM vaginal cream Place vaginally.  .Marland Kitchenezetimibe (ZETIA) 10 MG tablet Take 1 tablet (10 mg total) by mouth daily.  . famotidine (PEPCID) 20 MG tablet Take 1 tablet (20 mg total) by mouth 2 (two) times daily.  .Marland Kitchengabapentin (NEURONTIN) 400 MG capsule TAKE 1 CAPSULE BY MOUTH THREE TIMES DAILY  . glucose blood (ONETOUCH  ULTRA) test strip Use as instructed twice daily E11.9  . HYDROcodone-acetaminophen (NORCO/VICODIN) 5-325 MG tablet Take 1 tablet by mouth every 6 (six) hours as needed. (Patient not taking: Reported on 07/20/2020)  . Insulin Pen Needle 32G X 4 MM MISC Use to inject  lantus once daily  . Lancets MISC Apply 1 Device topically in the morning and at bedtime. Use twice a day. Once in the morning and once at bed time. DX: E11.9.. Soft Click Lancets, OneTouch Delica Plus Lancing Device  . LANTUS SOLOSTAR 100 UNIT/ML Solostar Pen INJECT 55 UNITS INTO THE SKIN DAILY.  .Marland Kitchenquinapril (ACCUPRIL) 40 MG tablet TAKE 1 TABLET BY MOUTH EVERY DAY  . tiZANidine (ZANAFLEX) 4 MG tablet TAKE 1 TABLET BY MOUTH EVERY 8 HOURS AS NEEDED FOR MUSCLE SPASM  . triamcinolone (NASACORT) 55 MCG/ACT AERO nasal inhaler Place 2 sprays into the nose daily.  . vitamin B-12 (CYANOCOBALAMIN) 1000 MCG tablet Take 1 tablet by mouth once daily  . zolpidem (AMBIEN) 10 MG tablet Take 1 tablet (10 mg total) by mouth at bedtime as needed.   No facility-administered encounter medications on file as of 07/20/2020.    Allergies (verified) Aspirin, Contrast media [iodinated diagnostic agents], Gadolinium derivatives, Metformin and related, Shellfish allergy, Tramadol, Penicillins, and Statins   History: Past Medical History:  Diagnosis Date  . Degenerative joint disease   . Diabetes mellitus   . Headache   . Hyperlipidemia   . Hypertension   . Sleep apnea    stopped using it  Past Surgical History:  Procedure Laterality Date  . ABDOMINAL HYSTERECTOMY  1976  . BACK SURGERY    . KNEE ARTHROSCOPY     left x 2  . LUMBAR LAMINECTOMY  1989   Family History  Problem Relation Age of Onset  . Hypertension Mother   . Diabetes Mother   . Thyroid disease Mother   . Heart disease Mother   . Kidney disease Mother   . Diabetes Sister   . Hypertension Brother   . Stroke Brother   . Heart disease Brother   . Colon cancer Neg Hx   . Colon  polyps Neg Hx   . Rectal cancer Neg Hx   . Stomach cancer Neg Hx    Social History   Socioeconomic History  . Marital status: Single    Spouse name: Not on file  . Number of children: 2  . Years of education: 8  . Highest education level: Not on file  Occupational History  . Occupation: Disability  Tobacco Use  . Smoking status: Former Smoker    Packs/day: 0.50    Years: 15.00    Pack years: 7.50    Quit date: 08/02/1990    Years since quitting: 29.9  . Smokeless tobacco: Never Used  Vaping Use  . Vaping Use: Never used  Substance and Sexual Activity  . Alcohol use: No    Alcohol/week: 0.0 standard drinks  . Drug use: No  . Sexual activity: Never  Other Topics Concern  . Not on file  Social History Narrative   Born in [place], [place].   Completed the 8th grade   Fun: Walk, crochet, puzzles   Diet - Variety of foods, does not eat breakfast   2 children - 44 and 45    6 grandchildren - 7 great grandchildren.    Social Determinants of Health   Financial Resource Strain: Low Risk   . Difficulty of Paying Living Expenses: Not hard at all  Food Insecurity: No Food Insecurity  . Worried About Charity fundraiser in the Last Year: Never true  . Ran Out of Food in the Last Year: Never true  Transportation Needs: No Transportation Needs  . Lack of Transportation (Medical): No  . Lack of Transportation (Non-Medical): No  Physical Activity: Sufficiently Active  . Days of Exercise per Week: 5 days  . Minutes of Exercise per Session: 30 min  Stress: No Stress Concern Present  . Feeling of Stress : Not at all  Social Connections: Moderately Integrated  . Frequency of Communication with Friends and Family: More than three times a week  . Frequency of Social Gatherings with Friends and Family: Once a week  . Attends Religious Services: More than 4 times per year  . Active Member of Clubs or Organizations: No  . Attends Archivist Meetings: More than 4 times per year   . Marital Status: Widowed    Tobacco Counseling Counseling given: Not Answered   Clinical Intake:  Pre-visit preparation completed: Yes  Pain : 0-10 Pain Score: 10-Worst pain ever Pain Type: Acute pain Pain Location: Leg Pain Orientation: Left,Right Pain Radiating Towards: n/a Pain Descriptors / Indicators: Pressure,Discomfort,Squeezing,Throbbing,Tightness Pain Onset: More than a month ago Pain Frequency: Several days a week (Patient stated that it happens while she is asleep) Pain Relieving Factors: none Effect of Pain on Daily Activities: Pain can diminish job performance, lower motivation to exercise, and prevent you from completing daily tasks. Pain produces disability and affects the quality  of life.  Pain Relieving Factors: none  BMI - recorded: 28.86 Nutritional Status: BMI 25 -29 Overweight Nutritional Risks: None Diabetes: Yes CBG done?: No Did pt. bring in CBG monitor from home?: No  How often do you need to have someone help you when you read instructions, pamphlets, or other written materials from your doctor or pharmacy?: 1 - Never What is the last grade level you completed in school?: 8th grade  Diabetic? yes  Interpreter Needed?: No  Information entered by :: Lisette Abu, LPN   Activities of Daily Living In your present state of health, do you have any difficulty performing the following activities: 07/20/2020 05/02/2020  Hearing? N N  Vision? N N  Difficulty concentrating or making decisions? N N  Walking or climbing stairs? N N  Dressing or bathing? N N  Doing errands, shopping? N N  Preparing Food and eating ? N -  Using the Toilet? N -  In the past six months, have you accidently leaked urine? N -  Do you have problems with loss of bowel control? N -  Managing your Medications? N -  Managing your Finances? N -  Housekeeping or managing your Housekeeping? N -  Some recent data might be hidden    Patient Care Team: Biagio Borg, MD  as PCP - General (Internal Medicine)  Indicate any recent Medical Services you may have received from other than [facility] providers in the past year (date may be approximate).     Assessment:   This is a routine wellness examination for [place].  Hearing/Vision screen No exam data present  Dietary issues and exercise activities discussed: Current Exercise Habits: Home exercise routine, Type of exercise: walking, Time (Minutes): 30, Frequency (Times/Week): 5, Weekly Exercise (Minutes/Week): 150, Exercise limited by: orthopedic condition(s)  Goals    . <enter goal here>    . Blood Pressure < 140/90    . Client understands the importance of follow-up with providers by attending scheduled visits     Continue to be independent, physically and socially active.  I would like to lose around 20 pounds by continue to walk in my neighborhood.    Marland Kitchen HEMOGLOBIN A1C < 7.0    . Increase the amount of physical     Join a sports center do water aerobics, walk more and get in the whirlpool    . LDL CALC < 100    . Patient Stated     I want to continue to exercise and monitor my diet for diabetes.       Depression Screen PHQ 2/9 Scores 07/20/2020 05/02/2020 05/02/2020 05/03/2019 04/16/2019 05/14/2018 03/10/2017  PHQ - 2 Score 0 0 0 0 0 0 0  Exception Documentation - - - - - - -    Fall Risk Fall Risk  07/20/2020 05/02/2020 05/02/2020 05/03/2019 04/16/2019  Falls in the past year? 0 0 0 0 0  Comment - - - - -  Number falls in past yr: 0 0 0 - 0  Injury with Fall? 0 0 0 - 0  Risk for fall due to : No Fall Risks - No Fall Risks - -  Follow up - - Falls evaluation completed - -    FALL RISK PREVENTION PERTAINING TO THE HOME:  Any stairs in or around the home? No  If so, are there any without handrails? No  Home free of loose throw rugs in walkways, pet beds, electrical cords, etc? Yes  Adequate lighting in your home  to reduce risk of falls? Yes   ASSISTIVE DEVICES UTILIZED TO PREVENT FALLS:  Life  alert? No  Use of a cane, walker or w/c? No  Grab bars in the bathroom? No  Shower chair or bench in shower? No  Elevated toilet seat or a handicapped toilet? No   TIMED UP AND GO:  Was the test performed? No .  Length of time to ambulate 10 feet: 0 sec.   Gait steady and fast without use of assistive device  Cognitive Function: MMSE - Mini Mental State Exam 03/10/2017 02/07/2015  Not completed: - Refused  Orientation to time 5 -  Orientation to Place 5 -  Registration 3 -  Attention/ Calculation 5 -  Recall 2 -  Language- name 2 objects 2 -  Language- repeat 1 -  Language- follow 3 step command 3 -  Language- read & follow direction 1 -  Write a sentence 1 -  Copy design 1 -  Total score 29 -        Immunizations Immunization History  Administered Date(s) Administered  . Fluad Quad(high Dose 65+) 03/03/2019, 05/02/2020  . Influenza Split 06/08/2012  . Influenza Whole 05/25/2007, 03/17/2008, 03/27/2009  . Influenza, High Dose Seasonal PF 03/10/2017, 03/15/2019  . Influenza,inj,Quad PF,6+ Mos 02/07/2015, 03/06/2016  . PFIZER(Purple Top)SARS-COV-2 Vaccination 08/09/2019, 08/31/2019, 05/23/2020  . Pneumococcal Conjugate-13 05/14/2018, 03/03/2019  . Pneumococcal Polysaccharide-23 06/27/2004, 03/06/2016  . Td 09/26/2008  . Tdap 05/03/2019  . Zoster 02/14/2014    TDAP status: Up to date  Flu Vaccine status: Up to date  Pneumococcal vaccine status: Up to date  Covid-19 vaccine status: Completed vaccines  Qualifies for Shingles Vaccine? Yes   Zostavax completed Yes   Shingrix Completed?: No.    Education has been provided regarding the importance of this vaccine. Patient has been advised to call insurance company to determine out of pocket expense if they have not yet received this vaccine. Advised may also receive vaccine at local pharmacy or Health Dept. Verbalized acceptance and understanding.  Screening Tests Health Maintenance  Topic Date Due  .  OPHTHALMOLOGY EXAM  05/01/2018  . HEMOGLOBIN A1C  07/25/2020  . FOOT EXAM  10/31/2020  . PNA vac Low Risk Adult (2 of 2 - PPSV23) 03/06/2021  . LIPID PANEL  04/26/2021  . MAMMOGRAM  10/13/2021  . COLONOSCOPY (Pts 45-74yr Insurance coverage will need to be confirmed)  07/22/2022  . TETANUS/TDAP  05/02/2029  . INFLUENZA VACCINE  Completed  . DEXA SCAN  Completed  . COVID-19 Vaccine  Completed  . Hepatitis C Screening  Completed    Health Maintenance  Health Maintenance Due  Topic Date Due  . OPHTHALMOLOGY EXAM  05/01/2018    Colorectal cancer screening: Type of screening: Colonoscopy. Completed 07/22/2012. Repeat every 10 years  Mammogram status: Completed 10/14/2019. Repeat every year  Bone Density status: Completed 04/03/2017. Results reflect: Bone density results: OSTEOPENIA. Repeat every 2-3 years.  Lung Cancer Screening: (Low Dose CT Chest recommended if Age 69-80years, 30 pack-year currently smoking OR have quit w/in 15years.) does qualify.   Lung Cancer Screening Referral: no  Additional Screening:  Hepatitis C Screening: does qualify; Completed yes  Vision Screening: Recommended annual ophthalmology exams for early detection of glaucoma and other disorders of the eye. Is the patient up to date with their annual eye exam?  Yes  Who is the provider or what is the name of the office in which the patient attends annual eye exams? [facility] If  pt is not established with a provider, would they like to be referred to a provider to establish care? No .   Dental Screening: Recommended annual dental exams for proper oral hygiene  Community Resource Referral / Chronic Care Management: CRR required this visit?  No   CCM required this visit?  No      Plan:     I have personally reviewed and noted the following in the patient's chart:   . Medical and social history . Use of alcohol, tobacco or illicit drugs  . Current medications and supplements . Functional  ability and status . Nutritional status . Physical activity . Advanced directives . List of other physicians . Hospitalizations, surgeries, and ER visits in previous 12 months . Vitals . Screenings to include cognitive, depression, and falls . Referrals and appointments  In addition, I have reviewed and discussed with patient certain preventive protocols, quality metrics, and best practice recommendations. A written personalized care plan for preventive services as well as general preventive health recommendations were provided to patient.     Sheral Flow, LPN   05/28/5850   Nurse Notes:  Medications reviewed with patient; no opioid use noted.

## 2020-08-25 ENCOUNTER — Encounter: Payer: Self-pay | Admitting: Internal Medicine

## 2020-08-25 ENCOUNTER — Other Ambulatory Visit: Payer: Self-pay

## 2020-08-25 ENCOUNTER — Ambulatory Visit (HOSPITAL_COMMUNITY)
Admission: RE | Admit: 2020-08-25 | Discharge: 2020-08-25 | Disposition: A | Discharge status: Home / Self Care | Payer: Medicare Other | Source: Ambulatory Visit | Attending: Internal Medicine | Admitting: Internal Medicine

## 2020-08-25 ENCOUNTER — Ambulatory Visit (INDEPENDENT_AMBULATORY_CARE_PROVIDER_SITE_OTHER): Payer: Medicare Other | Admitting: Internal Medicine

## 2020-08-25 VITALS — BP 140/80 | HR 100 | Temp 98.3°F | Ht 62.0 in | Wt 157.0 lb

## 2020-08-25 DIAGNOSIS — Z794 Long term (current) use of insulin: Secondary | ICD-10-CM

## 2020-08-25 DIAGNOSIS — I10 Essential (primary) hypertension: Secondary | ICD-10-CM

## 2020-08-25 DIAGNOSIS — M79605 Pain in left leg: Secondary | ICD-10-CM | POA: Diagnosis not present

## 2020-08-25 DIAGNOSIS — M79604 Pain in right leg: Secondary | ICD-10-CM | POA: Diagnosis not present

## 2020-08-25 DIAGNOSIS — E1165 Type 2 diabetes mellitus with hyperglycemia: Secondary | ICD-10-CM

## 2020-08-25 DIAGNOSIS — J4521 Mild intermittent asthma with (acute) exacerbation: Secondary | ICD-10-CM

## 2020-08-25 DIAGNOSIS — D1779 Benign lipomatous neoplasm of other sites: Secondary | ICD-10-CM

## 2020-08-25 MED ORDER — ALBUTEROL SULFATE HFA 108 (90 BASE) MCG/ACT IN AERS: 2.0000 | INHALATION_SPRAY | Freq: Four times a day (QID) | RESPIRATORY_TRACT | 5 refills | Status: DC | PRN

## 2020-08-25 MED ORDER — LOSARTAN POTASSIUM 100 MG PO TABS: 100.0000 mg | ORAL_TABLET | Freq: Every day | ORAL | 3 refills | Status: DC

## 2020-08-25 NOTE — Progress Notes (Signed)
Bilateral lower extremity venous study completed.   Unable to reach physician. Left on hold.     Please see CV Proc for preliminary results.   Vonzell Schlatter, RVT

## 2020-08-25 NOTE — Patient Instructions (Addendum)
You will be contacted regarding the referral for: leg vein ultrasound testing to look for blood clot  - for today  You will be contacted regarding the referral for: leg artery ultrasound later to check for artery blockages  If these tests are ok and you are still having pain, you should see the surgeon who worked on your lower back  Ok to change the accupril to losartan as you mentioned due to the recall  Please take all new medication as prescribed - the albuterol inhaler as needed for when the asthma seems worse like you are walking in the neighborhood after the grass is cut  Please continue all other medications as before, and refills have been done if requested.  Please have the pharmacy call with any other refills you may need.  Please continue your efforts at being more active, low cholesterol diet, and weight control.  You are otherwise up to date with prevention measures today.  Please keep your appointments with your specialists as you may have planned

## 2020-08-25 NOTE — Progress Notes (Signed)
Patient ID: Tiffany [name], female   DOB: 1951/05/10, 70 y.o.   MRN: [id number]        Chief Complaint: bilateral leg pain and swelling, left leg lipoma, dm, htn, asthma       HPI:  Tiffany [name] is a 70 y.o. female here with c/o bilateral leg discomfort with swelling left > right for several days, on top of chronic fatty lipoma area swelling to the medial right knee area; Pt denies chest pain, increased sob or doe, wheezing, orthopnea, PND, palpitations, dizziness or syncope, but very concerned about blood clots, and also mentions sob this time of year with walking down the road after new mown grass, asks for inhaler prn..  Also left more than right leg pain somewhat worse with ambulation, better with rest on the left primarily.  Also mentions accupril has been recalled due to pill production contaminant, asks for change.  Denies focal neuro s/s.  Pt denies polydipsia, polyuria,      Wt Readings from Last 3 Encounters:  08/25/20 157 lb (71.2 kg)  07/20/20 157 lb 12.8 oz (71.6 kg)  05/02/20 158 lb (71.7 kg)   BP Readings from Last 3 Encounters:  08/25/20 140/80  07/20/20 140/70  05/02/20 140/82         Past Medical History:  Diagnosis Date  . Degenerative joint disease   . Diabetes mellitus   . Headache   . Hyperlipidemia   . Hypertension   . Sleep apnea    stopped using it   Past Surgical History:  Procedure Laterality Date  . ABDOMINAL HYSTERECTOMY  1976  . BACK SURGERY    . KNEE ARTHROSCOPY     left x 2  . [place]    reports that she quit smoking about 30 years ago. She has a 7.50 pack-year smoking history. She has never used smokeless tobacco. She reports that she does not drink alcohol and does not use drugs. family history includes Diabetes in her mother and sister; Heart disease in her brother and mother; Hypertension in her brother and mother; Kidney disease in her mother; Stroke in her brother; Thyroid disease in her mother. Allergies  Allergen  Reactions  . Aspirin Nausea And Vomiting    Can take EC asa  . Contrast Media [Iodinated Diagnostic Agents] Nausea And Vomiting and Swelling    Patients throat closes with nausea and vomiting   . Gadolinium Derivatives Nausea And Vomiting  . Metformin And Related Nausea And Vomiting    Severe nausea and vomiting  . Shellfish Allergy Nausea And Vomiting  . Tramadol Nausea And Vomiting and Other (See Comments)    Migraine  . Penicillins   . Statins Other (See Comments)    myalgias   Current Outpatient Medications on File Prior to Visit  Medication Sig Dispense Refill  . amLODipine (NORVASC) 5 MG tablet TAKE 1 TABLET BY MOUTH EVERY DAY 90 tablet 3  . Blood Glucose Monitoring Suppl (ONE TOUCH ULTRA 2) w/Device KIT Apply 1 Device topically in the morning and at bedtime. E11.9 1 kit 0  . conjugated estrogens (PREMARIN) vaginal cream Place 1 Applicatorful vaginally daily. 3 days a week    . EPINEPHrine 0.3 mg/0.3 mL IJ SOAJ injection Inject 0.3 mLs (0.3 mg total) into the muscle as needed for anaphylaxis. 2 each 1  . estradiol (ESTRACE) 0.1 MG/GM vaginal cream Place vaginally.    Marland Kitchen ezetimibe (ZETIA) 10 MG tablet Take 1 tablet (10 mg total) by mouth daily.  90 tablet 3  . famotidine (PEPCID) 20 MG tablet Take 1 tablet (20 mg total) by mouth 2 (two) times daily. 180 tablet 3  . gabapentin (NEURONTIN) 400 MG capsule TAKE 1 CAPSULE BY MOUTH THREE TIMES DAILY 270 capsule 1  . glucose blood (ONETOUCH ULTRA) test strip Use as instructed twice daily E11.9 200 each 12  . Insulin Pen Needle 32G X 4 MM MISC Use to inject  lantus once daily 100 each 4  . Lancets MISC Apply 1 Device topically in the morning and at bedtime. Use twice a day. Once in the morning and once at bed time. DX: E11.9.. Soft Click Lancets, OneTouch Delica Plus Lancing Device 200 each 11  . LANTUS SOLOSTAR 100 UNIT/ML Solostar Pen INJECT 55 UNITS INTO THE SKIN DAILY. 100 mL 2  . tiZANidine (ZANAFLEX) 4 MG tablet TAKE 1 TABLET BY MOUTH  EVERY 8 HOURS AS NEEDED FOR MUSCLE SPASM 90 tablet 5  . triamcinolone (NASACORT) 55 MCG/ACT AERO nasal inhaler Place 2 sprays into the nose daily. 3 each 3  . vitamin B-12 (CYANOCOBALAMIN) 1000 MCG tablet Take 1 tablet by mouth once daily 90 tablet 0  . zolpidem (AMBIEN) 10 MG tablet Take 1 tablet (10 mg total) by mouth at bedtime as needed. 90 tablet 1  . HYDROcodone-acetaminophen (NORCO/VICODIN) 5-325 MG tablet Take 1 tablet by mouth every 6 (six) hours as needed. (Patient not taking: No sig reported) 15 tablet 0   No current facility-administered medications on file prior to visit.        ROS:  All others reviewed and negative.  Objective        PE:  BP 140/80 (BP Location: Right Arm, Patient Position: Sitting, Cuff Size: Normal)   Pulse 100   Temp 98.3 F (36.8 C) (Oral)   Ht 5' 2"  (1.575 m)   Wt 157 lb (71.2 kg)   SpO2 99%   BMI 28.72 kg/m                 Constitutional: Pt appears in NAD               HENT: Head: NCAT.                Right Ear: External ear normal.                 Left Ear: External ear normal.                Eyes: . Pupils are equal, round, and reactive to light. Conjunctivae and EOM are normal               Nose: without d/c or deformity               Neck: Neck supple. Gross normal ROM               Cardiovascular: Normal rate and regular rhythm.                 Pulmonary/Chest: Effort normal and breath sounds without rales or wheezing.                Abd:  Soft, NT, ND, + BS, no organomegaly               Neurological: Pt is alert. At baseline orientation, motor grossly intact               Skin: has bilat leg swelling left > right 1+, trace dorsalis pedis trace  bilat; fatty lipoma lesion approx 4 cm noted medial right knee area               Psychiatric: Pt behavior is normal without agitation   Micro: none  Cardiac tracings I have personally interpreted today:  none  Pertinent Radiological findings (summarize): none   Lab Results  Component  Value Date   WBC 4.3 04/26/2020   HGB 13.4 04/26/2020   HCT 40.5 04/26/2020   PLT 230.0 04/26/2020   GLUCOSE 98 04/26/2020   CHOL 205 (H) 04/26/2020   TRIG 52.0 04/26/2020   HDL 64.70 04/26/2020   LDLCALC 130 (H) 04/26/2020   ALT 25 04/26/2020   AST 19 04/26/2020   NA 139 04/26/2020   K 4.4 04/26/2020   CL 103 04/26/2020   CREATININE 0.71 04/26/2020   BUN 12 04/26/2020   CO2 28 04/26/2020   TSH 1.41 04/26/2020   HGBA1C 6.8 (H) 04/26/2020   MICROALBUR 1.2 04/26/2020   Assessment/Plan:  Tiffany [name] is a 70 y.o. Black or African American [2] female with  has a past medical history of Degenerative joint disease, Diabetes mellitus, Headache, Hyperlipidemia, Hypertension, and Sleep apnea.  Bilateral leg pain Left > right, cant r/o dvt - for venous doppler today, and if neg will need arterial study r/o PAD  Essential hypertension BP Readings from Last 3 Encounters:  08/25/20 140/80  07/20/20 140/70  05/02/20 140/82   Stable, pt to continue medical treatment  - change accupril to losartn 100   Diabetes ([place]) Lab Results  Component Value Date   HGBA1C 6.8 (H) 04/26/2020   Stable, pt to continue current medical treatment lantus   Lipoma To medial right knee, d/w  - benign, incidental, not at all related to current symptoms  Asthma Mild, intermittent, uncontrollled, for albuterol hfa prn,  to f/u any worsening symptoms or concerns  Followup: Return if symptoms worsen or fail to improve.  Cathlean Cower, MD 08/26/2020 7:32 PM [facility]

## 2020-08-26 ENCOUNTER — Encounter: Payer: Self-pay | Admitting: Internal Medicine

## 2020-08-26 DIAGNOSIS — D179 Benign lipomatous neoplasm, unspecified: Secondary | ICD-10-CM | POA: Insufficient documentation

## 2020-08-26 DIAGNOSIS — J45909 Unspecified asthma, uncomplicated: Secondary | ICD-10-CM | POA: Insufficient documentation

## 2020-08-26 NOTE — Assessment & Plan Note (Signed)
Mild, intermittent, uncontrollled, for albuterol hfa prn,  to f/u any worsening symptoms or concerns

## 2020-08-26 NOTE — Assessment & Plan Note (Signed)
To medial right knee, d/w  - benign, incidental, not at all related to current symptoms

## 2020-08-26 NOTE — Assessment & Plan Note (Signed)
Lab Results  Component Value Date   HGBA1C 6.8 (H) 04/26/2020   Stable, pt to continue current medical treatment lantus

## 2020-08-26 NOTE — Assessment & Plan Note (Signed)
Left > right, cant r/o dvt - for venous doppler today, and if neg will need arterial study r/o PAD

## 2020-08-26 NOTE — Assessment & Plan Note (Signed)
BP Readings from Last 3 Encounters:  08/25/20 140/80  07/20/20 140/70  05/02/20 140/82   Stable, pt to continue medical treatment  - change accupril to losartn 100

## 2020-08-29 ENCOUNTER — Telehealth: Payer: Self-pay | Admitting: Internal Medicine

## 2020-08-29 ENCOUNTER — Other Ambulatory Visit: Payer: Self-pay | Admitting: Internal Medicine

## 2020-08-29 DIAGNOSIS — I739 Peripheral vascular disease, unspecified: Secondary | ICD-10-CM

## 2020-08-29 DIAGNOSIS — M79604 Pain in right leg: Secondary | ICD-10-CM

## 2020-08-29 NOTE — Telephone Encounter (Signed)
Patient called to check on the results of her ultrasound from the other day. She would also like to know if the results are normal should she still see vascular and vein.   Please advise and call back at [phone]

## 2020-08-30 NOTE — Telephone Encounter (Signed)
The vein ultrasound was negative, I though they had told you   Ok to proceed with the artery testing as planned apr 12

## 2020-08-30 NOTE — Telephone Encounter (Signed)
Patient notified and verbalizes understanding.

## 2020-09-05 ENCOUNTER — Ambulatory Visit (HOSPITAL_COMMUNITY)
Admission: RE | Admit: 2020-09-05 | Discharge: 2020-09-05 | Disposition: A | Discharge status: Home / Self Care | Payer: Medicare Other | Source: Ambulatory Visit | Attending: Cardiovascular Disease | Admitting: Cardiovascular Disease

## 2020-09-05 ENCOUNTER — Other Ambulatory Visit: Payer: Self-pay

## 2020-09-05 DIAGNOSIS — M79604 Pain in right leg: Secondary | ICD-10-CM | POA: Diagnosis not present

## 2020-09-05 DIAGNOSIS — M79605 Pain in left leg: Secondary | ICD-10-CM

## 2020-09-05 DIAGNOSIS — I739 Peripheral vascular disease, unspecified: Secondary | ICD-10-CM | POA: Insufficient documentation

## 2020-09-07 ENCOUNTER — Encounter: Payer: Self-pay | Admitting: Internal Medicine

## 2020-10-06 ENCOUNTER — Encounter (HOSPITAL_COMMUNITY): Payer: Self-pay | Admitting: Neurology

## 2020-10-06 ENCOUNTER — Inpatient Hospital Stay (HOSPITAL_COMMUNITY): Payer: Medicare Other

## 2020-10-06 ENCOUNTER — Emergency Department (HOSPITAL_COMMUNITY): Payer: Medicare Other

## 2020-10-06 ENCOUNTER — Inpatient Hospital Stay (HOSPITAL_COMMUNITY)
Admission: EM | Admit: 2020-10-06 | Discharge: 2020-10-08 | DRG: 062 | Disposition: A | Discharge status: Home / Self Care | Payer: Medicare Other | Attending: Neurology | Admitting: Neurology

## 2020-10-06 ENCOUNTER — Other Ambulatory Visit: Payer: Self-pay

## 2020-10-06 DIAGNOSIS — E119 Type 2 diabetes mellitus without complications: Secondary | ICD-10-CM | POA: Diagnosis not present

## 2020-10-06 DIAGNOSIS — J322 Chronic ethmoidal sinusitis: Secondary | ICD-10-CM | POA: Diagnosis not present

## 2020-10-06 DIAGNOSIS — Z87891 Personal history of nicotine dependence: Secondary | ICD-10-CM | POA: Diagnosis not present

## 2020-10-06 DIAGNOSIS — R410 Disorientation, unspecified: Secondary | ICD-10-CM | POA: Diagnosis not present

## 2020-10-06 DIAGNOSIS — E559 Vitamin D deficiency, unspecified: Secondary | ICD-10-CM | POA: Diagnosis not present

## 2020-10-06 DIAGNOSIS — Z888 Allergy status to other drugs, medicaments and biological substances status: Secondary | ICD-10-CM | POA: Diagnosis not present

## 2020-10-06 DIAGNOSIS — E785 Hyperlipidemia, unspecified: Secondary | ICD-10-CM | POA: Diagnosis not present

## 2020-10-06 DIAGNOSIS — G4733 Obstructive sleep apnea (adult) (pediatric): Secondary | ICD-10-CM | POA: Diagnosis present

## 2020-10-06 DIAGNOSIS — I63512 Cerebral infarction due to unspecified occlusion or stenosis of left middle cerebral artery: Secondary | ICD-10-CM | POA: Diagnosis not present

## 2020-10-06 DIAGNOSIS — Z886 Allergy status to analgesic agent status: Secondary | ICD-10-CM

## 2020-10-06 DIAGNOSIS — Z823 Family history of stroke: Secondary | ICD-10-CM

## 2020-10-06 DIAGNOSIS — Z79899 Other long term (current) drug therapy: Secondary | ICD-10-CM

## 2020-10-06 DIAGNOSIS — Z91013 Allergy to seafood: Secondary | ICD-10-CM

## 2020-10-06 DIAGNOSIS — R531 Weakness: Secondary | ICD-10-CM | POA: Diagnosis not present

## 2020-10-06 DIAGNOSIS — E7800 Pure hypercholesterolemia, unspecified: Secondary | ICD-10-CM | POA: Diagnosis present

## 2020-10-06 DIAGNOSIS — R9082 White matter disease, unspecified: Secondary | ICD-10-CM | POA: Diagnosis not present

## 2020-10-06 DIAGNOSIS — Z90710 Acquired absence of both cervix and uterus: Secondary | ICD-10-CM

## 2020-10-06 DIAGNOSIS — Z91041 Radiographic dye allergy status: Secondary | ICD-10-CM | POA: Diagnosis not present

## 2020-10-06 DIAGNOSIS — I6389 Other cerebral infarction: Secondary | ICD-10-CM | POA: Diagnosis not present

## 2020-10-06 DIAGNOSIS — I10 Essential (primary) hypertension: Secondary | ICD-10-CM | POA: Diagnosis not present

## 2020-10-06 DIAGNOSIS — K219 Gastro-esophageal reflux disease without esophagitis: Secondary | ICD-10-CM | POA: Diagnosis present

## 2020-10-06 DIAGNOSIS — R29704 NIHSS score 4: Secondary | ICD-10-CM | POA: Diagnosis present

## 2020-10-06 DIAGNOSIS — E538 Deficiency of other specified B group vitamins: Secondary | ICD-10-CM | POA: Diagnosis not present

## 2020-10-06 DIAGNOSIS — Z880 Allergy status to penicillin: Secondary | ICD-10-CM

## 2020-10-06 DIAGNOSIS — R29898 Other symptoms and signs involving the musculoskeletal system: Secondary | ICD-10-CM | POA: Diagnosis not present

## 2020-10-06 DIAGNOSIS — R29818 Other symptoms and signs involving the nervous system: Secondary | ICD-10-CM | POA: Diagnosis not present

## 2020-10-06 DIAGNOSIS — R29810 Facial weakness: Secondary | ICD-10-CM | POA: Diagnosis not present

## 2020-10-06 DIAGNOSIS — G319 Degenerative disease of nervous system, unspecified: Secondary | ICD-10-CM | POA: Diagnosis not present

## 2020-10-06 DIAGNOSIS — I639 Cerebral infarction, unspecified: Secondary | ICD-10-CM | POA: Diagnosis not present

## 2020-10-06 DIAGNOSIS — R0902 Hypoxemia: Secondary | ICD-10-CM | POA: Diagnosis not present

## 2020-10-06 DIAGNOSIS — Z20822 Contact with and (suspected) exposure to covid-19: Secondary | ICD-10-CM | POA: Diagnosis present

## 2020-10-06 DIAGNOSIS — G8191 Hemiplegia, unspecified affecting right dominant side: Secondary | ICD-10-CM | POA: Diagnosis present

## 2020-10-06 DIAGNOSIS — Z794 Long term (current) use of insulin: Secondary | ICD-10-CM

## 2020-10-06 DIAGNOSIS — J0120 Acute ethmoidal sinusitis, unspecified: Secondary | ICD-10-CM | POA: Diagnosis not present

## 2020-10-06 LAB — I-STAT CHEM 8, ED
BUN: 19 mg/dL (ref 8–23)
Calcium, Ion: 1.06 mmol/L — ABNORMAL LOW (ref 1.15–1.40)
Chloride: 106 mmol/L (ref 98–111)
Creatinine, Ser: 0.6 mg/dL (ref 0.44–1.00)
Glucose, Bld: 233 mg/dL — ABNORMAL HIGH (ref 70–99)
HCT: 43 % (ref 36.0–46.0)
Hemoglobin: 14.6 g/dL (ref 12.0–15.0)
Potassium: 4.3 mmol/L (ref 3.5–5.1)
Sodium: 139 mmol/L (ref 135–145)
TCO2: 24 mmol/L (ref 22–32)

## 2020-10-06 LAB — COMPREHENSIVE METABOLIC PANEL
ALT: 28 U/L (ref 0–44)
AST: 24 U/L (ref 15–41)
Albumin: 4.1 g/dL (ref 3.5–5.0)
Alkaline Phosphatase: 61 U/L (ref 38–126)
Anion gap: 10 (ref 5–15)
BUN: 15 mg/dL (ref 8–23)
CO2: 21 mmol/L — ABNORMAL LOW (ref 22–32)
Calcium: 9.1 mg/dL (ref 8.9–10.3)
Chloride: 105 mmol/L (ref 98–111)
Creatinine, Ser: 0.75 mg/dL (ref 0.44–1.00)
GFR, Estimated: >60 mL/min (ref >60)
Glucose, Bld: 230 mg/dL — ABNORMAL HIGH (ref 70–99)
Potassium: 4.3 mmol/L (ref 3.5–5.1)
Sodium: 136 mmol/L (ref 135–145)
Total Bilirubin: 0.6 mg/dL (ref 0.3–1.2)
Total Protein: 8.5 g/dL — ABNORMAL HIGH (ref 6.5–8.1)

## 2020-10-06 LAB — CBC
HCT: 42.6 % (ref 36.0–46.0)
Hemoglobin: 13.9 g/dL (ref 12.0–15.0)
MCH: 29.1 pg (ref 26.0–34.0)
MCHC: 32.6 g/dL (ref 30.0–36.0)
MCV: 89.3 fL (ref 80.0–100.0)
Platelets: 212 10*3/uL (ref 150–400)
RBC: 4.77 MIL/uL (ref 3.87–5.11)
RDW: 13.8 % (ref 11.5–15.5)
WBC: 6.4 10*3/uL (ref 4.0–10.5)
nRBC: 0 % (ref 0.0–0.2)

## 2020-10-06 LAB — APTT: aPTT: 26 seconds (ref 24–36)

## 2020-10-06 LAB — DIFFERENTIAL
Abs Immature Granulocytes: 0.02 10*3/uL (ref 0.00–0.07)
Basophils Absolute: 0 10*3/uL (ref 0.0–0.1)
Basophils Relative: 0 %
Eosinophils Absolute: 0 10*3/uL (ref 0.0–0.5)
Eosinophils Relative: 0 %
Immature Granulocytes: 0 %
Lymphocytes Relative: 15 %
Lymphs Abs: 1 10*3/uL (ref 0.7–4.0)
Monocytes Absolute: 0.3 10*3/uL (ref 0.1–1.0)
Monocytes Relative: 4 %
Neutro Abs: 5.1 10*3/uL (ref 1.7–7.7)
Neutrophils Relative %: 81 %

## 2020-10-06 LAB — SARS CORONAVIRUS 2 (TAT 6-24 HRS): SARS Coronavirus 2: NEGATIVE

## 2020-10-06 LAB — GLUCOSE, CAPILLARY
Glucose-Capillary: 115 mg/dL — ABNORMAL HIGH (ref 70–99)
Glucose-Capillary: 133 mg/dL — ABNORMAL HIGH (ref 70–99)

## 2020-10-06 LAB — PROTIME-INR
INR: 1 (ref 0.8–1.2)
Prothrombin Time: 13.2 seconds (ref 11.4–15.2)

## 2020-10-06 LAB — MRSA PCR SCREENING: MRSA by PCR: NEGATIVE

## 2020-10-06 LAB — CBG MONITORING, ED: Glucose-Capillary: 188 mg/dL — ABNORMAL HIGH (ref 70–99)

## 2020-10-06 MED ORDER — LABETALOL HCL 5 MG/ML IV SOLN: 10.0000 mg | Freq: Once | INTRAVENOUS | Status: DC | PRN

## 2020-10-06 MED ORDER — ACETAMINOPHEN 650 MG RE SUPP: 650.0000 mg | RECTAL | Status: DC | PRN

## 2020-10-06 MED ORDER — ALTEPLASE (STROKE) FULL DOSE INFUSION
0.9000 mg/kg | Freq: Once | INTRAVENOUS | Status: AC
  Administered 2020-10-06: 65.5 mg via INTRAVENOUS
  Filled 2020-10-06: qty 100

## 2020-10-06 MED ORDER — SODIUM CHLORIDE 0.9% FLUSH: 3.0000 mL | Freq: Once | INTRAVENOUS | Status: DC

## 2020-10-06 MED ORDER — SENNOSIDES-DOCUSATE SODIUM 8.6-50 MG PO TABS
2.0000 | ORAL_TABLET | Freq: Every day | ORAL | Status: DC
  Administered 2020-10-07: 2 via ORAL
  Filled 2020-10-06 (×2): qty 2

## 2020-10-06 MED ORDER — STROKE: EARLY STAGES OF RECOVERY BOOK
Freq: Once | Status: AC
  Filled 2020-10-06: qty 1

## 2020-10-06 MED ORDER — ACETAMINOPHEN 160 MG/5ML PO SOLN: 650.0000 mg | ORAL | Status: DC | PRN

## 2020-10-06 MED ORDER — CHLORHEXIDINE GLUCONATE CLOTH 2 % EX PADS: 6.0000 | MEDICATED_PAD | Freq: Every day | CUTANEOUS | Status: DC

## 2020-10-06 MED ORDER — PANTOPRAZOLE SODIUM 40 MG IV SOLR
40.0000 mg | Freq: Every day | INTRAVENOUS | Status: DC
  Administered 2020-10-06: 40 mg via INTRAVENOUS
  Filled 2020-10-06: qty 40

## 2020-10-06 MED ORDER — CLEVIDIPINE BUTYRATE 0.5 MG/ML IV EMUL: 0.0000 mg/h | INTRAVENOUS | Status: DC

## 2020-10-06 MED ORDER — SODIUM CHLORIDE 0.9 % IV SOLN
50.0000 mL | Freq: Once | INTRAVENOUS | Status: AC
  Administered 2020-10-06: 50 mL via INTRAVENOUS

## 2020-10-06 MED ORDER — INSULIN ASPART 100 UNIT/ML IJ SOLN: 0.0000 [IU] | Freq: Every day | INTRAMUSCULAR | Status: DC

## 2020-10-06 MED ORDER — INSULIN ASPART 100 UNIT/ML IJ SOLN
0.0000 [IU] | Freq: Three times a day (TID) | INTRAMUSCULAR | Status: DC
  Administered 2020-10-07: 2 [IU] via SUBCUTANEOUS
  Administered 2020-10-08: 5 [IU] via SUBCUTANEOUS
  Administered 2020-10-08: 2 [IU] via SUBCUTANEOUS

## 2020-10-06 MED ORDER — SODIUM CHLORIDE 0.9 % IV SOLN: INTRAVENOUS | Status: DC

## 2020-10-06 MED ORDER — ACETAMINOPHEN 325 MG PO TABS
650.0000 mg | ORAL_TABLET | ORAL | Status: DC | PRN
  Administered 2020-10-06 – 2020-10-08 (×5): 650 mg via ORAL
  Filled 2020-10-06 (×6): qty 2

## 2020-10-06 NOTE — ED Notes (Signed)
Taken to MRI and Cathy,RN from [facility] took over pt care from MRI to 4N after MRI.

## 2020-10-06 NOTE — Code Documentation (Signed)
Stroke Response Nurse Documentation Code Documentation  Tiffany [name] is a 70 y.o. female arriving to [place]. [facility] ED via [facility] on 10/06/2020 with past medical hx of diabetes, HTN, HLP. Code stroke was activated by EMS. Patient from home where she was LKW at 1144 and now complaining of right sided weakness and aphasia.  Stroke team at the bedside on patient arrival. Family was with patient when she was noted to have right sided weakness and trouble speaking. EMS called and when she got up to walk, right side went flaccid and patient became aphasic. Patient returned to sitting and symptoms started to resolve.   Labs drawn and patient cleared for CT by Dr. Karle Starch. Patient to CT with team. NIHSS 4, see documentation for details and code stroke times. Patient with disoriented, right limb ataxia and Sensory  neglect on exam. The following imaging was completed. CT. Patient is allergic to contrast dye and unable to get CTA. Decision made to get MRA. Low NIHSS.  Patient is a candidate for tPA and started in CT. See MAR.   Care/Plan: q 15 mNIHSS/VS x 2 hours. Notify provider of 2 hour NIH. BP < 180/105. Bedside handoff with ED RN Ma Katrina.    Kathrin Greathouse  Stroke Response RN

## 2020-10-06 NOTE — Progress Notes (Signed)
Pharmacist Code Stroke Response  Notified to mix tPA at 1228 by Dr. Lorrin Goodell Delivered tPA to RN at 1230  tPA dose = 6.6mg  bolus over 1 minute followed by 58.9mg  for a total dose of 65.5mg  over 1 hour  Issues/delays encountered (if applicable): n/a   Arturo Morton, PharmD, BCPS Please check AMION for all [facility] contact numbers Clinical Pharmacist 10/06/2020 12:36 PM

## 2020-10-06 NOTE — Progress Notes (Signed)
Report given to [employer] on [place]. Patient to be taken to MRI and then transferred to [place].

## 2020-10-06 NOTE — Code Documentation (Signed)
1307 - Patient started complaining of new frontal headache behind the eyes. No change to exam. MD Lorrin Goodell made aware. tPA paused.   1309 - Taken to CT.  1322 - CT completed and reviewed by MD Khaliqdina. Verbal order to restart tPA.

## 2020-10-06 NOTE — ED Provider Notes (Signed)
Patient by EMS as a Code Stroke. R sided weakness has come and gone, one episode witnessed by EMS at 1144 resolved after just a couple of minutes. Currently asymptomatic. Airway is intact. Neurology at bedside.    Truddie Hidden, MD 10/06/20 1210

## 2020-10-06 NOTE — H&P (Signed)
Neurology History and Physical  CC: code stroke  History is obtained from: EMS  HPI: Tiffany [name] is a 70 yo female with a PMHx of OSA without use of CPAP, HTN, HLD, DM II, and DJD who presents today as a code stroke. Per EMS, patient was LKW at 1030. Then she had right sided weakness. By the time EMS arrived, those symptoms subsided. However, patient got up from chair and had flaccidity of Right side, but did not fall. At that point, code stroke was activated in the field. BP 170s and CBG 216 en route. No personal history of stroke, but FMHx of stroke in patient's brother.   After brief exam at ED bridge with airway clearance, patient was taken emergently to CT suite. CT showed no acute findings. CTA was ordered, but cancelled due to patient allergy to contrast.   After CT, patient taken back to ED for further testing.   In review of chart back to 2017, nothing significant in notes.   Patient will be admitted by neurology due to tPA administration.   Low intensity monitoring for [employer] trial.   +++Note, after tPA finished, patient c/o frontal HA and nausea. Patient taken back to CT with no evidence of bleed.    LKW: 1030  hours tpa given?: Yes, started at 1230 hours.  IR Thrombectomy?: no MRS 0  NIHSS:  1a Level of Consciousness: 0 1b LOC Questions: 1 1c LOC Commands: 0 2 Best Gaze: 0 3 Visual: 0 4 Facial Palsy: 0 5a Motor Arm - left: 0 5b Motor Arm - Right: 1 6a Motor Leg - Left: 0 6b Motor Leg - Right: 0 7 Limb Ataxia: 1 8 Sensory: 0 9 Best Language: 0 10 Dysarthria: 0 11 Extinction and Inattention: 1 TOTAL: 4  ROS: A robust ROS was performed and is negative except as noted in the HPI.   Past Medical History:  Diagnosis Date  . Degenerative joint disease   . Diabetes mellitus   . Headache   . Hyperlipidemia   . Hypertension   . Sleep apnea    stopped using it     Family History  Problem Relation Age of Onset  . Hypertension Mother   . Diabetes Mother    . Thyroid disease Mother   . Heart disease Mother   . Kidney disease Mother   . Diabetes Sister   . Hypertension Brother   . Stroke Brother   . Heart disease Brother   . Colon cancer Neg Hx   . Colon polyps Neg Hx   . Rectal cancer Neg Hx   . Stomach cancer Neg Hx     Social History:  reports that she quit smoking about 30 years ago. She has a 7.50 pack-year smoking history. She has never used smokeless tobacco. She reports that she does not drink alcohol and does not use drugs.   Prior to Admission medications   Medication Sig Start Date End Date Taking? Authorizing Provider  albuterol (VENTOLIN HFA) 108 (90 Base) MCG/ACT inhaler Inhale 2 puffs into the lungs every 6 (six) hours as needed for wheezing or shortness of breath. 08/25/20   Biagio Borg, MD  amLODipine (NORVASC) 5 MG tablet TAKE 1 TABLET BY MOUTH EVERY DAY 07/14/20   Biagio Borg, MD  Blood Glucose Monitoring Suppl (ONE TOUCH ULTRA 2) w/Device KIT Apply 1 Device topically in the morning and at bedtime. E11.9 08/09/19   Biagio Borg, MD  conjugated estrogens (PREMARIN) vaginal cream Place 1  Applicatorful vaginally daily. 3 days a week    [provider]  EPINEPHrine 0.3 mg/0.3 mL IJ SOAJ injection Inject 0.3 mLs (0.3 mg total) into the muscle as needed for anaphylaxis. 11/01/19   Biagio Borg, MD  estradiol (ESTRACE) 0.1 MG/GM vaginal cream Place vaginally. 02/29/20   [provider]  ezetimibe (ZETIA) 10 MG tablet Take 1 tablet (10 mg total) by mouth daily. 05/03/19   Biagio Borg, MD  famotidine (PEPCID) 20 MG tablet Take 1 tablet (20 mg total) by mouth 2 (two) times daily. 05/31/20   Biagio Borg, MD  gabapentin (NEURONTIN) 400 MG capsule TAKE 1 CAPSULE BY MOUTH THREE TIMES DAILY 11/01/19   Biagio Borg, MD  glucose blood [facility] ULTRA) test strip Use as instructed twice daily E11.9 08/09/19   Biagio Borg, MD  HYDROcodone-acetaminophen (NORCO/VICODIN) 5-325 MG tablet Take 1 tablet by mouth every 6 (six)  hours as needed. Patient not taking: No sig reported 05/25/19   Biagio Borg, MD  Insulin Pen Needle 32G X 4 MM MISC Use to inject  lantus once daily 03/19/16   Golden Circle, FNP  Lancets MISC Apply 1 Device topically in the morning and at bedtime. Use twice a day. Once in the morning and once at bed time. DX: E11.9.. Soft Click Lancets, OneTouch Delica Plus Lancing Device 05/08/20   Biagio Borg, MD  LANTUS SOLOSTAR 100 UNIT/ML Solostar Pen INJECT 55 UNITS INTO THE SKIN DAILY. 07/11/20   Biagio Borg, MD  losartan (COZAAR) 100 MG tablet Take 1 tablet (100 mg total) by mouth daily. 08/25/20   Biagio Borg, MD  tiZANidine (ZANAFLEX) 4 MG tablet TAKE 1 TABLET BY MOUTH EVERY 8 HOURS AS NEEDED FOR MUSCLE SPASM 05/02/20   Biagio Borg, MD  triamcinolone (NASACORT) 55 MCG/ACT AERO nasal inhaler Place 2 sprays into the nose daily. 05/02/20   Biagio Borg, MD  vitamin B-12 (CYANOCOBALAMIN) 1000 MCG tablet Take 1 tablet by mouth once daily 07/18/20   Biagio Borg, MD  zolpidem (AMBIEN) 10 MG tablet Take 1 tablet (10 mg total) by mouth at bedtime as needed. 05/02/20   Biagio Borg, MD    Exam: Current vital signs: BP (!) 164/70   Pulse 89   Temp 98.2 F (36.8 C) (Oral)   Resp 13   Ht _0  (1.575 m)   Wt 72.8 kg   SpO2 99%   BMI 29.35 kg/m   Physical Exam  Constitutional: Appears well-developed and well-nourished.  Psych: Affect appropriate to situation Eyes: No scleral injection HENT: No OP obstrucion Head: Normocephalic.  Cardiovascular: Normal rate and regular rhythm.  Respiratory: Effort normal. GI: Soft.  No distension. There is no tenderness.  Skin: WDI.  Neuro: Mental Status: Patient is awake, alert, oriented to person, place, month, year, and situation. Patient is able to give a clear and coherent history. No signs of neglect Speech/Language:  Speech is clear, fluent without dysarthria or aphasia. Repetition, naming, and comprehension intact.   Cranial Nerves: II:  Visual Fields are full. Pupils are equal, round, and reactive to light.  III,IV, VI: EOMI without ptosis or diploplia.  V: Facial sensation is symmetric to light touch in V1, V2, and V3 VII: Facial movement is symmetric.  VIII: hearing is intact to voice. X: Uvula elevates symmetrically. XI: Shoulder shrug is symmetric. XII: tongue is midline without atrophy or fasciculations.   Motor: RUE:  grips  4/5  biceps  4/5     triceps  4/5 LUE:  grips   5/5    biceps  5/5     triceps  5/5 RLE: knee 4/5     thigh  4/5     plantar flexion   4/5     dorsiflexion  4/5 LLE: knee  5/5     thigh  5 /5      plantar flexion   5/5     dorsiflexion  5/5 Tone is normal. Bulk is normal. 5/5 strength was present in all four extremities.  Sensory: Sensation is symmetric to light touch in all fours extremities. Extinction present with light touch DSS to RUE.  Plantars: Toes are downgoing bilaterally.  Cerebellar: Ataxia noted to RUE.   I have reviewed labs in epic and the pertinent results are: INR  1     aPTT  26      MD reviewed the images obtained:  NCT head  1. No acute intracranial abnormality or significant interval change. 2. Mild white matter disease likely reflects the sequela of chronic microvascular ischemia. 3. Aspects 10/10  NCT head after tPA.  1.  No acute intracranial abnormality.  MRI brain ordered.  MRA head and neck. Ordered.   Assessment: 70 yo female who presented as a code stroke for right sided weakness. CTH in suite was negative except for mild white matter disease and chronic microvascular ischemia. LKW was 1030 hours. After risks/benefits and inclusion/exclusion criteria reviewed with patient by Dr. Milas Gain, patient consented to tPA administration. NIHSS was 4 in scanner, and decreased to 2 while tPA administered. Due to inability to perform CTA head and neck due to contrast allergy, patient was taken back to the ED room with plans for MRI/MRA brain and MRA neck. Patient's  risks factors for stroke are HTN, OSA, IDDM II, and HLD. After tPA, patient c/o HA and nausea. Patient was taken back to CT stat, which showed no bleed or changes from [facility] during code stroke. Patient will be admitted by [place] due to tPA administration.   Impression:  1. Stroke s/p tPA administration.  2. IDDM II. 3. HTN. 4. HLD.   Plan: - neuro admit to ICU.  - MRI brain without contrast. - MRA head and neck. -no non compressive sticks or tube placement x 24 hours after tPA administration.  -bleeding precautions. -no chemical VTE, use SCDs. -TTE. - HbA1c, lipid panel, TSH. - Statin or increased dose if LDL > 70 - antiplatelet therapy deferred to stroke team tomorrow.  -SBP goal -post tPA less than 180/105. labetalol prn x 1, then Cleviprex as needed.  -Telemetry monitoring for arrhythmia. - bedside Swallow screen. - Stroke education. - PT/OT/SLP consult. - NIHSS as per protocol. - frequent neuro checks.  -sliding scale insulin. CBGs.  - Recommend metabolic/infectious workup with UA with UCx, CXR, CK, serum lactate. -stroke team to follow beginning in a.m.   This patient is critically ill and at significant risk of neurological worsening, death and care requires constant monitoring of vital signs, hemodynamics,respiratory and cardiac monitoring, neurological assessment, discussion with family, other specialists and medical decision making of high complexity. I spent ______ minutes of neurocritical care time  in the care of  this patient. This was time spent independent of any time provided by nurse practitioner or PA.  Electronically signed by: Clance Boll, MSN, APN-BC, nurse practitioner and by MD. Note/plan to be edited by MD as needed.  Pager: [phone]

## 2020-10-06 NOTE — Progress Notes (Signed)
PT Cancellation Note  Patient Details Name: Tiffany [name] MRN: [id number] DOB: 1950-05-30   Cancelled Treatment:    Reason Eval/Treat Not Completed: Active bedrest order. PT order received and appreciated however this conflicts with current bedrest order set. Please increase activity tolerance as appropriate and remove bedrest from orders. PT will hold evaluation at this time and will check back another day pending increased activity orders.   Moishe Spice, PT, DPT Acute Rehabilitation Services  Pager: [phone] Office: [place] 10/06/2020, 5:03 PM

## 2020-10-06 NOTE — ED Provider Notes (Addendum)
[facility] EMERGENCY DEPARTMENT Provider Note   CSN: [id number] Arrival date & time: 10/06/20  1213     History No chief complaint on file.   Tiffany [name] is a 70 y.o. female.  Patient is a 70 year old female with a history of hypertension, hyperlipidemia, diabetes, ongoing issues with her leg and back that she is seeing a specialist for who is presenting today as a code stroke.  History was obtained by her son.  He reports he called him around 11:00 today and told him she was having trouble moving her arm and he noticed her speech was slightly slurred.  He told her to hang up and call 911 and he left work and went to her house.  When he got there she had not called the ambulance because she could not seem to understand how to do that.  He did note when he got there she seemed to be a little off but she was able to walk and move okay.  When EMS arrived her exam was reportedly normal but when she got up to walk into the kitchen her right arm was weak and unable to lift and she was having difficulty with speech.  This was witnessed by EMS at 1145 but resolved after several minutes and upon arrival to the emergency room patient is symptom-free.  Neurology at bedside upon patient's arrival.  She had not recently been ill or had any other complaints per her son.  No prior history of similar symptoms.  Patient is high functioning and takes care of all of her daily needs.  The history is provided by the patient, a relative and medical records.       Past Medical History:  Diagnosis Date  . Degenerative joint disease   . Diabetes mellitus   . Headache   . Hyperlipidemia   . Hypertension   . Sleep apnea    stopped using it    Patient Active Problem List   Diagnosis Date Noted  . Lipoma 08/26/2020  . Asthma 08/26/2020  . Bilateral leg pain 08/25/2020  . Encounter for well adult exam with abnormal findings 05/06/2020  . Allergic rhinitis 05/06/2020  . Statin  myopathy 05/02/2020  . Left shoulder pain 11/01/2019  . Chronic low back pain 05/03/2019  . B12 deficiency 05/03/2019  . Left leg swelling 11/16/2018  . Left knee pain 11/16/2018  . Low back pain 05/14/2018  . Abnormal SPEP 11/18/2017  . Sternoclavicular joint pain, left 08/19/2017  . Cervical radiculopathy at C8 05/15/2017  . Pre-ulcerative corn or callous 03/27/2017  . Vitamin D deficiency 03/27/2017  . Elevated total protein 03/27/2017  . Bilateral shoulder pain 03/27/2017  . Medicare annual wellness visit, subsequent 03/06/2016  . Spondylolisthesis at L4-L5 level 10/06/2015  . Acute frontal sinusitis 07/14/2015  . Sleep disturbance 07/14/2015  . Knee pain, left 11/10/2014  . Sprain of ankle 04/11/2014  . GERD (gastroesophageal reflux disease) 03/07/2014  . Prolapse of vaginal wall with midline cystocele, grade 1 08/23/2013  . Vaginal dryness 05/14/2012  . Dry mouth 05/13/2012  . Depression and insomnia 07/19/2010  . Multilevel degenerative disc disease of cervical and lumbar spine  01/16/2008  . Essential hypertension 01/01/2008  . Diabetes ([place]) 03/18/2007  . Hyperlipidemia 02/13/2007  . SLEEP APNEA 02/13/2007    Past Surgical History:  Procedure Laterality Date  . ABDOMINAL HYSTERECTOMY  1976  . BACK SURGERY    . KNEE ARTHROSCOPY     left x 2  .  LUMBAR LAMINECTOMY  1989     OB History   No obstetric history on file.     Family History  Problem Relation Age of Onset  . Hypertension Mother   . Diabetes Mother   . Thyroid disease Mother   . Heart disease Mother   . Kidney disease Mother   . Diabetes Sister   . Hypertension Brother   . Stroke Brother   . Heart disease Brother   . Colon cancer Neg Hx   . Colon polyps Neg Hx   . Rectal cancer Neg Hx   . Stomach cancer Neg Hx     Social History   Tobacco Use  . Smoking status: Former Smoker    Packs/day: 0.50    Years: 15.00    Pack years: 7.50    Quit date: 08/02/1990    Years since quitting: 30.2   . Smokeless tobacco: Never Used  Vaping Use  . Vaping Use: Never used  Substance Use Topics  . Alcohol use: No    Alcohol/week: 0.0 standard drinks  . Drug use: No    Home Medications Prior to Admission medications   Medication Sig Start Date End Date Taking? Authorizing Provider  albuterol (VENTOLIN HFA) 108 (90 Base) MCG/ACT inhaler Inhale 2 puffs into the lungs every 6 (six) hours as needed for wheezing or shortness of breath. 08/25/20   Biagio Borg, MD  amLODipine (NORVASC) 5 MG tablet TAKE 1 TABLET BY MOUTH EVERY DAY 07/14/20   Biagio Borg, MD  Blood Glucose Monitoring Suppl (ONE TOUCH ULTRA 2) w/Device KIT Apply 1 Device topically in the morning and at bedtime. E11.9 08/09/19   Biagio Borg, MD  conjugated estrogens (PREMARIN) vaginal cream Place 1 Applicatorful vaginally daily. 3 days a week    [provider]  EPINEPHrine 0.3 mg/0.3 mL IJ SOAJ injection Inject 0.3 mLs (0.3 mg total) into the muscle as needed for anaphylaxis. 11/01/19   Biagio Borg, MD  estradiol (ESTRACE) 0.1 MG/GM vaginal cream Place vaginally. 02/29/20   [provider]  ezetimibe (ZETIA) 10 MG tablet Take 1 tablet (10 mg total) by mouth daily. 05/03/19   Biagio Borg, MD  famotidine (PEPCID) 20 MG tablet Take 1 tablet (20 mg total) by mouth 2 (two) times daily. 05/31/20   Biagio Borg, MD  gabapentin (NEURONTIN) 400 MG capsule TAKE 1 CAPSULE BY MOUTH THREE TIMES DAILY 11/01/19   Biagio Borg, MD  glucose blood [facility] ULTRA) test strip Use as instructed twice daily E11.9 08/09/19   Biagio Borg, MD  HYDROcodone-acetaminophen (NORCO/VICODIN) 5-325 MG tablet Take 1 tablet by mouth every 6 (six) hours as needed. Patient not taking: No sig reported 05/25/19   Biagio Borg, MD  Insulin Pen Needle 32G X 4 MM MISC Use to inject  lantus once daily 03/19/16   Golden Circle, FNP  Lancets MISC Apply 1 Device topically in the morning and at bedtime. Use twice a day. Once in the morning and once at bed  time. DX: E11.9.. Soft Click Lancets, OneTouch Delica Plus Lancing Device 05/08/20   Biagio Borg, MD  LANTUS SOLOSTAR 100 UNIT/ML Solostar Pen INJECT 55 UNITS INTO THE SKIN DAILY. 07/11/20   Biagio Borg, MD  losartan (COZAAR) 100 MG tablet Take 1 tablet (100 mg total) by mouth daily. 08/25/20   Biagio Borg, MD  tiZANidine (ZANAFLEX) 4 MG tablet TAKE 1 TABLET BY MOUTH EVERY 8 HOURS AS NEEDED FOR MUSCLE  SPASM 05/02/20   Biagio Borg, MD  triamcinolone (NASACORT) 55 MCG/ACT AERO nasal inhaler Place 2 sprays into the nose daily. 05/02/20   Biagio Borg, MD  vitamin B-12 (CYANOCOBALAMIN) 1000 MCG tablet Take 1 tablet by mouth once daily 07/18/20   Biagio Borg, MD  zolpidem (AMBIEN) 10 MG tablet Take 1 tablet (10 mg total) by mouth at bedtime as needed. 05/02/20   Biagio Borg, MD    Allergies    Aspirin, Contrast media [iodinated diagnostic agents], Gadolinium derivatives, Metformin and related, Shellfish allergy, Tramadol, Penicillins, and Statins  Review of Systems   Review of Systems  All other systems reviewed and are negative.   Physical Exam Updated Vital Signs Ht 5' 2"  (1.575 m)   Wt 72.8 kg   BMI 29.35 kg/m   Physical Exam Vitals and nursing note reviewed.  Constitutional:      General: She is not in acute distress.    Appearance: She is well-developed.  HENT:     Head: Normocephalic and atraumatic.     Nose: Nose normal.     Mouth/Throat:     Mouth: Mucous membranes are moist.  Eyes:     Pupils: Pupils are equal, round, and reactive to light.  Cardiovascular:     Rate and Rhythm: Normal rate and regular rhythm.     Heart sounds: Normal heart sounds. No murmur heard. No friction rub.  Pulmonary:     Effort: Pulmonary effort is normal.     Breath sounds: Normal breath sounds. No wheezing or rales.  Abdominal:     General: Bowel sounds are normal. There is no distension.     Palpations: Abdomen is soft.     Tenderness: There is no abdominal tenderness. There is no  guarding or rebound.  Musculoskeletal:        General: No tenderness. Normal range of motion.     Cervical back: Normal range of motion and neck supple.     Comments: No edema  Skin:    General: Skin is warm and dry.     Findings: No rash.  Neurological:     Mental Status: She is alert and oriented to person, place, and time.     Cranial Nerves: No cranial nerve deficit.     Sensory: Sensation is intact.     Motor: Weakness present.     Coordination: Finger-Nose-Finger Test abnormal.     Comments: No signs of confusion at this time.  Speech is fluent without aphasia.  Minimal weakness noted in the right arm with some ataxia of the right arm.  Lower extremities within normal limits.  Left upper extremity 5 out of 5 strength with normal finger-to-nose.  Psychiatric:        Mood and Affect: Mood normal.        Behavior: Behavior normal.      ED Results / Procedures / Treatments   Labs (all labs ordered are listed, but only abnormal results are displayed) Labs Reviewed  CBG MONITORING, ED - Abnormal; Notable for the following components:      Result Value   Glucose-Capillary 188 (*)    All other components within normal limits  I-STAT CHEM 8, ED - Abnormal; Notable for the following components:   Glucose, Bld 233 (*)    Calcium, Ion 1.06 (*)    All other components within normal limits  PROTIME-INR  APTT  CBC  DIFFERENTIAL  COMPREHENSIVE METABOLIC PANEL  CBG MONITORING, ED  EKG EKG Interpretation  Date/Time:  Friday Oct 06 2020 12:50:17 EDT Ventricular Rate:  98 PR Interval:  220 QRS Duration: 88 QT Interval:  353 QTC Calculation: 451 R Axis:   270 Text Interpretation: Sinus rhythm Prolonged PR interval Consider left atrial enlargement Left anterior fascicular block Abnormal R-wave progression, late transition No significant change since last tracing Confirmed by Blanchie Dessert [phone]) on 10/06/2020 1:02:04 PM   Radiology CT HEAD CODE STROKE WO  CONTRAST  Result Date: 10/06/2020 CLINICAL DATA:  Code stroke.  Right-sided weakness. EXAM: CT HEAD WITHOUT CONTRAST TECHNIQUE: Contiguous axial images were obtained from the base of the skull through the vertex without intravenous contrast. COMPARISON:  CT head without contrast 04/02/2015 FINDINGS: Brain: Linear hypodense artifact is noted over the left convexity. No acute infarct, hemorrhage, or mass lesion is present. Mild white matter hypoattenuation is present bilaterally. Basal ganglia are intact. Insular ribbon is normal. The ventricles are of normal size. No significant extraaxial fluid collection is present. Vascular: No hyperdense vessel or unexpected calcification. Skull: Calvarium is intact. No focal lytic or blastic lesions are present. No significant extracranial soft tissue lesion is present. Sinuses/Orbits: The paranasal sinuses and mastoid air cells are clear. The globes and orbits are within normal limits. ASPECTS [facility] Stroke Program Early CT Score) - Ganglionic level infarction (caudate, lentiform nuclei, internal capsule, insula, M1-M3 cortex): 7/7 - Supraganglionic infarction (M4-M6 cortex): 3/3 Total score (0-10 with 10 being normal): 10/10 IMPRESSION: 1. No acute intracranial abnormality or significant interval change. 2. Mild white matter disease likely reflects the sequela of chronic microvascular ischemia. 3. Aspects 10/10 These results were called by telephone at the time of interpretation on 10/06/2020 at 12:25 pm to provider [facility] , who verbally acknowledged these results. Electronically Signed   By: San Morelle M.D.   On: 10/06/2020 12:40    Procedures Procedures   Medications Ordered in ED Medications  sodium chloride flush (NS) 0.9 % injection 3 mL (has no administration in time range)    ED Course  I have reviewed the triage vital signs and the nursing notes.  Pertinent labs & imaging results that were available during my care of the patient  were reviewed by me and considered in my medical decision making (see chart for details).    MDM Rules/Calculators/A&P                          Patient is a 70 year old female who presented today as a code stroke.  She had stuttering symptoms from between 10 and 11:00 until arrival.  Patient was noted to have a normal exam with EMS and then when she walked to get her keys at 1145 she had notable right arm weakness, speech difficulty which then resolved in a matter of minutes.  Upon arrival here she was still having some difficulty with her right arm and had an NIH of 4 due to limb ataxia, weakness and confusion.  CT was negative for acute findings.  Patient does not take anticoagulation and has no prior history of this.  Blood sugar was in the 200s.  Given her persistent NIH and symptoms she was started on tPA.  She will get an MRA and will be started in the optimist trial with neurology.  Patient's labs show an i-STAT with a blood sugar of 233 but normal renal function and electrolytes, INR within normal limits, CBC within normal limits.  EKG without acute changes.  MDM Number of  Diagnoses or Management Options   Amount and/or Complexity of Data Reviewed Clinical lab tests: ordered and reviewed Tests in the radiology section of CPT: ordered and reviewed Tests in the medicine section of CPT: ordered and reviewed Decide to obtain previous medical records or to obtain history from someone other than the patient: yes Obtain history from someone other than the patient: yes Review and summarize past medical records: yes Discuss the patient with other providers: yes Independent visualization of images, tracings, or specimens: yes  Risk of Complications, Morbidity, and/or Mortality Presenting problems: high Diagnostic procedures: high Management options: high  Patient Progress Patient progress: stable  CRITICAL CARE Performed by: Kemo Spruce Total critical care time: 30  minutes Critical care time was exclusive of separately billable procedures and treating other patients. Critical care was necessary to treat or prevent imminent or life-threatening deterioration. Critical care was time spent personally by me on the following activities: development of treatment plan with patient and/or surrogate as well as nursing, discussions with consultants, evaluation of patient's response to treatment, examination of patient, obtaining history from patient or surrogate, ordering and performing treatments and interventions, ordering and review of laboratory studies, ordering and review of radiographic studies, pulse oximetry and re-evaluation of patient's condition.  Final Clinical Impression(s) / ED Diagnoses Final diagnoses:  Acute ischemic stroke [facility])    Rx / DC Orders ED Discharge Orders    None       Blanchie Dessert, MD 10/06/20 1302    Blanchie Dessert, MD 10/06/20 1317

## 2020-10-06 NOTE — ED Triage Notes (Signed)
Code stroke. LKN at 1144. Came in the right sided weakness, facial droop and aphasia.

## 2020-10-07 ENCOUNTER — Inpatient Hospital Stay (HOSPITAL_COMMUNITY): Payer: Medicare Other

## 2020-10-07 DIAGNOSIS — I639 Cerebral infarction, unspecified: Secondary | ICD-10-CM

## 2020-10-07 DIAGNOSIS — I6389 Other cerebral infarction: Secondary | ICD-10-CM

## 2020-10-07 LAB — COMPREHENSIVE METABOLIC PANEL
ALT: 24 U/L (ref 0–44)
AST: 22 U/L (ref 15–41)
Albumin: 3.7 g/dL (ref 3.5–5.0)
Alkaline Phosphatase: 60 U/L (ref 38–126)
Anion gap: 8 (ref 5–15)
BUN: 8 mg/dL (ref 8–23)
CO2: 26 mmol/L (ref 22–32)
Calcium: 8.9 mg/dL (ref 8.9–10.3)
Chloride: 106 mmol/L (ref 98–111)
Creatinine, Ser: 0.68 mg/dL (ref 0.44–1.00)
GFR, Estimated: >60 mL/min (ref >60)
Glucose, Bld: 82 mg/dL (ref 70–99)
Potassium: 3.9 mmol/L (ref 3.5–5.1)
Sodium: 140 mmol/L (ref 135–145)
Total Bilirubin: 0.9 mg/dL (ref 0.3–1.2)
Total Protein: 8 g/dL (ref 6.5–8.1)

## 2020-10-07 LAB — CBC
HCT: 40.2 % (ref 36.0–46.0)
Hemoglobin: 13.2 g/dL (ref 12.0–15.0)
MCH: 28.9 pg (ref 26.0–34.0)
MCHC: 32.8 g/dL (ref 30.0–36.0)
MCV: 88.2 fL (ref 80.0–100.0)
Platelets: 220 10*3/uL (ref 150–400)
RBC: 4.56 MIL/uL (ref 3.87–5.11)
RDW: 13.9 % (ref 11.5–15.5)
WBC: 4.8 10*3/uL (ref 4.0–10.5)
nRBC: 0 % (ref 0.0–0.2)

## 2020-10-07 LAB — HEMOGLOBIN A1C
Hgb A1c MFr Bld: 7.4 % — ABNORMAL HIGH (ref 4.8–5.6)
Mean Plasma Glucose: 165.68 mg/dL

## 2020-10-07 LAB — ECHOCARDIOGRAM COMPLETE
AR max vel: 2.81 cm2
AV Area VTI: 2.82 cm2
AV Area mean vel: 2.92 cm2
AV Mean grad: 2 mmHg
AV Peak grad: 4.9 mmHg
Ao pk vel: 1.11 m/s
Area-P 1/2: 2.95 cm2
Height: 62 in
MV VTI: 2.04 cm2
S' Lateral: 2.1 cm
Weight: 2567.92 oz

## 2020-10-07 LAB — GLUCOSE, CAPILLARY
Glucose-Capillary: 84 mg/dL (ref 70–99)
Glucose-Capillary: 93 mg/dL (ref 70–99)
Glucose-Capillary: 143 mg/dL — ABNORMAL HIGH (ref 70–99)
Glucose-Capillary: 108 mg/dL — ABNORMAL HIGH (ref 70–99)

## 2020-10-07 LAB — HIV ANTIBODY (ROUTINE TESTING W REFLEX): HIV Screen 4th Generation wRfx: NONREACTIVE

## 2020-10-07 LAB — TSH: TSH: 1.875 u[IU]/mL (ref 0.350–4.500)

## 2020-10-07 LAB — LIPID PANEL
Cholesterol: 236 mg/dL — ABNORMAL HIGH (ref 0–200)
HDL: 70 mg/dL (ref >40)
LDL Cholesterol: 159 mg/dL — ABNORMAL HIGH (ref 0–99)
Total CHOL/HDL Ratio: 3.4 RATIO
Triglycerides: 37 mg/dL (ref <150)
VLDL: 7 mg/dL (ref 0–40)

## 2020-10-07 MED ORDER — BUTALBITAL-APAP-CAFFEINE 50-325-40 MG PO TABS
1.0000 | ORAL_TABLET | Freq: Two times a day (BID) | ORAL | Status: DC | PRN
  Administered 2020-10-07 – 2020-10-08 (×2): 1 via ORAL
  Filled 2020-10-07 (×2): qty 1

## 2020-10-07 MED ORDER — LOSARTAN POTASSIUM 50 MG PO TABS
50.0000 mg | ORAL_TABLET | Freq: Every day | ORAL | Status: DC
  Administered 2020-10-08: 50 mg via ORAL
  Filled 2020-10-07: qty 1

## 2020-10-07 MED ORDER — VITAMIN B-12 1000 MCG PO TABS
1000.0000 ug | ORAL_TABLET | Freq: Every day | ORAL | Status: DC
  Administered 2020-10-07 – 2020-10-08 (×2): 1000 ug via ORAL
  Filled 2020-10-07 (×2): qty 1

## 2020-10-07 MED ORDER — FAMOTIDINE 20 MG PO TABS
20.0000 mg | ORAL_TABLET | Freq: Two times a day (BID) | ORAL | Status: DC
  Administered 2020-10-07 – 2020-10-08 (×2): 20 mg via ORAL
  Filled 2020-10-07 (×2): qty 1

## 2020-10-07 MED ORDER — LABETALOL HCL 5 MG/ML IV SOLN
5.0000 mg | Freq: Once | INTRAVENOUS | Status: DC | PRN
Start: 2020-10-07 — End: 2020-10-08

## 2020-10-07 MED ORDER — PANTOPRAZOLE SODIUM 40 MG PO TBEC
40.0000 mg | DELAYED_RELEASE_TABLET | Freq: Every day | ORAL | Status: DC
  Administered 2020-10-07: 40 mg via ORAL
  Filled 2020-10-07: qty 1

## 2020-10-07 MED ORDER — TRIAMCINOLONE ACETONIDE 55 MCG/ACT NA AERO
2.0000 | INHALATION_SPRAY | Freq: Every day | NASAL | Status: DC
  Filled 2020-10-07: qty 21.6

## 2020-10-07 MED ORDER — CLOPIDOGREL BISULFATE 75 MG PO TABS
75.0000 mg | ORAL_TABLET | Freq: Every day | ORAL | Status: DC
  Administered 2020-10-07 – 2020-10-08 (×2): 75 mg via ORAL
  Filled 2020-10-07 (×2): qty 1

## 2020-10-07 MED ORDER — AMLODIPINE BESYLATE 5 MG PO TABS
5.0000 mg | ORAL_TABLET | Freq: Every day | ORAL | Status: DC
  Administered 2020-10-08: 5 mg via ORAL
  Filled 2020-10-07: qty 1

## 2020-10-07 MED ORDER — EZETIMIBE 10 MG PO TABS
10.0000 mg | ORAL_TABLET | Freq: Every day | ORAL | Status: DC
  Administered 2020-10-08: 10 mg via ORAL
  Filled 2020-10-07: qty 1

## 2020-10-07 MED ORDER — GABAPENTIN 400 MG PO CAPS
400.0000 mg | ORAL_CAPSULE | Freq: Three times a day (TID) | ORAL | Status: DC
  Administered 2020-10-07 – 2020-10-08 (×2): 400 mg via ORAL
  Filled 2020-10-07 (×3): qty 1

## 2020-10-07 MED ORDER — ZOLPIDEM TARTRATE 5 MG PO TABS: 5.0000 mg | ORAL_TABLET | Freq: Every evening | ORAL | Status: DC | PRN

## 2020-10-07 NOTE — Progress Notes (Signed)
  Echocardiogram 2D Echocardiogram has been performed.  Tiffany [name] 10/07/2020, 12:22 PM

## 2020-10-07 NOTE — Evaluation (Signed)
Physical Therapy Evaluation Patient Details Name: Tiffany [name] MRN: [id number] DOB: 08/05/1950 Today's Date: 10/07/2020   History of Present Illness  70 Right handed F p/w R sided weakness with poor coordination in R hand and R extinction. tPA risks and benefits discussed and patient opted for tPA. Cerebral infarction due to embolism of  left middle cerebral artery. PMH includes HTN, OSA, IDDM II, and HLD  Clinical Impression   Pt admitted secondary to problem above with deficits below. PTA patient was living alone, driving and performing IADLs. Pt currently requires minguard assist for ambulation, however with noted RUE decr coordination with attempted use. Also noted ?expressive difficulties with pt not able to name the year and having difficulty at end of session explaining how her head was feeling. Patient likely will have greater OT and possibly SLP needs than PT needs.  Will continue to follow acutely to maximize functional mobility independence and safety.       Follow Up Recommendations Outpatient PT;Supervision - Intermittent    Equipment Recommendations  None recommended by PT    Recommendations for Other Services OT consult;Speech consult     Precautions / Restrictions Precautions Precautions: Fall      Mobility  Bed Mobility Overal bed mobility: Independent                  Transfers Overall transfer level: Needs assistance Equipment used: None Transfers: Sit to/from Stand Sit to Stand: Min guard         General transfer comment: from bed, from recliner, from toilet x2  Ambulation/Gait Ambulation/Gait assistance: Min guard Gait Distance (Feet): 15 Feet (60, 15) Assistive device: None Gait Pattern/deviations: Step-through pattern;Decreased stride length;Wide base of support     General Gait Details: slower velocity, slightly guarded posture during gait  Stairs            Wheelchair Mobility    Modified Rankin (Stroke Patients  Only) Modified Rankin (Stroke Patients Only) Pre-Morbid Rankin Score: No symptoms Modified Rankin: Moderately severe disability     Balance Overall balance assessment: Mild deficits observed, not formally tested                                           Pertinent Vitals/Pain Pain Assessment: 0-10 Pain Score: 4  Pain Descriptors / Indicators: Headache Pain Intervention(s): Limited activity within patient's tolerance;Monitored during session;Premedicated before session;Repositioned    Home Living Family/patient expects to be discharged to:: Private residence Living Arrangements: Alone Available Help at Discharge: Family;Available PRN/intermittently (son and grandaughter) Type of Home: Apartment Home Access: Level entry     Home Layout: One level Home Equipment: Cane - single point;Walker - 2 wheels      Prior Function Level of Independence: Independent         Comments: drives, grocery shopping     Hand Dominance   Dominant Hand: Right    Extremity/Trunk Assessment   Upper Extremity Assessment Upper Extremity Assessment: Defer to OT evaluation    Lower Extremity Assessment Lower Extremity Assessment: Overall WFL for tasks assessed    Cervical / Trunk Assessment Cervical / Trunk Assessment: Normal  Communication   Communication: No difficulties  Cognition Arousal/Alertness: Awake/alert Behavior During Therapy: WFL for tasks assessed/performed Overall Cognitive Status: Impaired/Different from baseline Area of Impairment: Orientation;Awareness                 Orientation Level:  Time (+month; needed assist with yr (?aphasia))         Awareness:  (pre-intellectual--could not name any deficits she was noticing at beginning of session)          General Comments General comments (skin integrity, edema, etc.): Noted decr coordination RUE with reaching for toilet paper, paper towels, handsoap. On second trip to wash hands, noted she  used her left hand to reach for paper towels    Exercises     Assessment/Plan    PT Assessment Patient needs continued PT services  PT Problem List Decreased balance;Decreased mobility;Decreased coordination       PT Treatment Interventions DME instruction;Gait training;Functional mobility training;Therapeutic activities;Therapeutic exercise;Balance training;Neuromuscular re-education;Patient/family education    PT Goals (Current goals can be found in the Care Plan section)  Acute Rehab PT Goals Patient Stated Goal: agrees wants to get rt arm working right PT Goal Formulation: With patient Time For Goal Achievement: 10/21/20 Potential to Achieve Goals: Good    Frequency Min 4X/week   Barriers to discharge        Co-evaluation               AM-PAC PT "6 Clicks" Mobility  Outcome Measure Help needed turning from your back to your side while in a flat bed without using bedrails?: None Help needed moving from lying on your back to sitting on the side of a flat bed without using bedrails?: None Help needed moving to and from a bed to a chair (including a wheelchair)?: A Little Help needed standing up from a chair using your arms (e.g., wheelchair or bedside chair)?: A Little Help needed to walk in hospital room?: A Little Help needed climbing 3-5 steps with a railing? : A Little 6 Click Score: 20    End of Session Equipment Utilized During Treatment: Gait belt Activity Tolerance: Patient tolerated treatment well Patient left: in chair;with call bell/phone within reach;Other (comment) (no chair alarm on unit; RN aware and OK wiht pt up to chair without alarm) Nurse Communication: Mobility status PT Visit Diagnosis: Difficulty in walking, not elsewhere classified (R26.2)    Time: 6283-1517 PT Time Calculation (min) (ACUTE ONLY): 43 min   Charges:   PT Evaluation $PT Eval Moderate Complexity: 1 Mod PT Treatments $Gait Training: 23-37 mins         Arby Barrette,  PT Pager [phone]   Rexanne Mano 10/07/2020, 4:39 PM

## 2020-10-07 NOTE — Progress Notes (Signed)
STROKE TEAM PROGRESS NOTE   SUBJECTIVE (INTERVAL HISTORY) Her son and RN are at the bedside.  Overall her condition is stable. Pt complained of HA, bilateral frontal, not relived by tylenol. Neuro unchanged. Gave fioricet. Pending 24h tPA CT. Discussed with Dr. Pascal Lux, pt had left MCA strokes with likely left M2 high grade stenosis. Will do DAPT after 24h of tPA if no bleeding on CT.    OBJECTIVE Temp:  [98.2 F (36.8 C)-98.6 F (37 C)] 98.4 F (36.9 C) (05/14 0400) Pulse Rate:  [63-97] 67 (05/14 0800) Resp:  [10-20] 11 (05/14 0800) BP: (100-174)/(66-101) 149/68 (05/14 0800) SpO2:  [95 %-100 %] 96 % (05/14 0800) Weight:  [72.8 kg] 72.8 kg (05/13 1244)  Recent Labs  Lab 10/06/20 1206 10/06/20 1716 10/06/20 2222 10/07/20 0803  GLUCAP 188* 115* 133* 84   Recent Labs  Lab 10/06/20 1214 10/06/20 1220 10/07/20 0456  NA 136 139 140  K 4.3 4.3 3.9  CL 105 106 106  CO2 21*  --  26  GLUCOSE 230* 233* 82  BUN 15 19 8   CREATININE 0.75 0.60 0.68  CALCIUM 9.1  --  8.9   Recent Labs  Lab 10/06/20 1214 10/07/20 0456  AST 24 22  ALT 28 24  ALKPHOS 61 60  BILITOT 0.6 0.9  PROT 8.5* 8.0  ALBUMIN 4.1 3.7   Recent Labs  Lab 10/06/20 1214 10/06/20 1220 10/07/20 0456  WBC 6.4  --  4.8  NEUTROABS 5.1  --   --   HGB 13.9 14.6 13.2  HCT 42.6 43.0 40.2  MCV 89.3  --  88.2  PLT 212  --  220   No results for input(s): CKTOTAL, CKMB, CKMBINDEX, TROPONINI in the last 168 hours. Recent Labs    10/06/20 1214  LABPROT 13.2  INR 1.0   No results for input(s): COLORURINE, LABSPEC, PHURINE, GLUCOSEU, HGBUR, BILIRUBINUR, KETONESUR, PROTEINUR, UROBILINOGEN, NITRITE, LEUKOCYTESUR in the last 72 hours.  Invalid input(s): APPERANCEUR     Component Value Date/Time   CHOL 236 (H) 10/07/2020 0456   TRIG 37 10/07/2020 0456   HDL 70 10/07/2020 0456   CHOLHDL 3.4 10/07/2020 0456   VLDL 7 10/07/2020 0456   LDLCALC 159 (H) 10/07/2020 0456   Lab Results  Component Value Date   HGBA1C  7.4 (H) 10/07/2020   No results found for: LABOPIA, COCAINSCRNUR, LABBENZ, AMPHETMU, THCU, LABBARB  No results for input(s): ETH in the last 168 hours.  I have personally reviewed the radiological images below and agree with the radiology interpretations.  CT HEAD WO CONTRAST  Result Date: 10/06/2020 CLINICAL DATA:  Follow-up right-sided weakness. EXAM: CT HEAD WITHOUT CONTRAST TECHNIQUE: Contiguous axial images were obtained from the base of the skull through the vertex without intravenous contrast. COMPARISON:  CT head from same day at 1216 hours. FINDINGS: Brain: No evidence of acute infarction, hemorrhage, hydrocephalus, extra-axial collection or mass lesion/mass effect. Vascular: No hyperdense vessel or unexpected calcification. Skull: Normal. Negative for fracture or focal lesion. Sinuses/Orbits: No acute finding. Other: None. IMPRESSION: 1.  No acute intracranial abnormality. Electronically Signed   By: Titus Dubin M.D.   On: 10/06/2020 13:45   MR ANGIO HEAD WO CONTRAST  Result Date: 10/06/2020 CLINICAL DATA:  Neuro deficit. Acute, stroke suspected. Confusion. Abnormal speech. Right upper extremity weakness. EXAM: MRI HEAD WITHOUT CONTRAST MRA HEAD WITHOUT CONTRAST MRA NECK WITHOUT CONTRAST TECHNIQUE: Multiplanar, multiecho pulse sequences of the brain and surrounding structures were obtained without intravenous contrast. Angiographic images of the  Circle of Willis were obtained using MRA technique without intravenous contrast. Angiographic images of the neck were obtained using MRA technique without intravenous contrast. Carotid stenosis measurements (when applicable) are obtained utilizing NASCET criteria, using the distal internal carotid diameter as the denominator. COMPARISON:  CT head without contrast 10/06/2020 FINDINGS: MRI HEAD FINDINGS Brain: Diffusion-weighted images demonstrate scattered areas of acute cortical infarction involving the high left parietal lobe. Focal restricted  diffusion is also present in the superior aspect of the left occipital lobe. Minimal involvement of the right frontal lobe noted on image 85 of series 5. T2 hyperintensities are associated with the areas of acute cortical infarction. No acute hemorrhage or mass lesion is present. Calcified meningioma present along the medial aspect of the left occipital lobe. No other mass lesion is present. Minimal white matter changes are present. The ventricles are of normal size. No significant extraaxial fluid collection is present. The internal auditory canals are within normal limits. Insert normal brainstem Vascular: Flow is present in the major intracranial arteries. Skull and upper cervical spine: The craniocervical junction is normal. Degenerative disc disease present C3-4 and C4-5. Central canal narrowing is greater at C4-5. Degenerative endplate marrow changes are present. Marrow signal is otherwise normal. Sinuses/Orbits: The paranasal sinuses and mastoid air cells are clear. The globes and orbits are within normal limits. MRA HEAD FINDINGS Mild irregularity is present within the cavernous left ICA. Internal carotid arteries are otherwise within normal limits from high cervical segments through the ICA termini bilaterally. The A1 and M1 segments are normal. Signal loss in the proximal inferior left M2 segment is felt to be artifactual. There is segmental narrowing in distal MCA branch vessels without a significant proximal stenosis or occlusion. The anterior communicating artery patent. ACA branch vessels within normal limits. The right vertebral artery is dominant. Right PICA origin visualized and normal. Hypoplastic left V4 segment noted. Vertebrobasilar junction within normal limits. Basilar artery is small. Fetal type posterior cerebral arteries are present bilaterally. There is segmental narrowing of distal PCA branch vessels without a significant proximal stenosis or occlusion. Basilar artery centrally terminates  at the superior cerebellar arteries. MRA NECK FINDINGS The time-of-flight images demonstrate no significant flow disturbance at either carotid bifurcation. Flow is antegrade in the vertebral arteries bilaterally. The right vertebral artery is dominant. IMPRESSION: 1. Scattered areas of acute/subacute cortical infarction involving the high left parietal lobe, superior aspect of the left occipital lobe, and minimal involvement of the right frontal lobe. 2. No other acute intracranial abnormality. 3. Calcified meningioma along the medial aspect of the left occipital lobe. 4. Mild distal small vessel disease without a significant proximal stenosis, aneurysm, or branch vessel occlusion within the Circle of Willis. 5. Signal loss in the proximal inferior left M2 segment is felt to be artifactual. 6. Fetal type posterior cerebral arteries bilaterally. 7. Signal loss in the distal PCA branch vessels is felt to be artifactual. The above was relayed via text pager to Dr. Lorrin Goodell on 10/06/2020 at 17:13 . Electronically Signed   By: San Morelle M.D.   On: 10/06/2020 17:13   MR ANGIO NECK WO CONTRAST  Result Date: 10/06/2020 CLINICAL DATA:  Neuro deficit. Acute, stroke suspected. Confusion. Abnormal speech. Right upper extremity weakness. EXAM: MRI HEAD WITHOUT CONTRAST MRA HEAD WITHOUT CONTRAST MRA NECK WITHOUT CONTRAST TECHNIQUE: Multiplanar, multiecho pulse sequences of the brain and surrounding structures were obtained without intravenous contrast. Angiographic images of the Circle of Willis were obtained using MRA technique without intravenous contrast. Angiographic images  of the neck were obtained using MRA technique without intravenous contrast. Carotid stenosis measurements (when applicable) are obtained utilizing NASCET criteria, using the distal internal carotid diameter as the denominator. COMPARISON:  CT head without contrast 10/06/2020 FINDINGS: MRI HEAD FINDINGS Brain: Diffusion-weighted images  demonstrate scattered areas of acute cortical infarction involving the high left parietal lobe. Focal restricted diffusion is also present in the superior aspect of the left occipital lobe. Minimal involvement of the right frontal lobe noted on image 85 of series 5. T2 hyperintensities are associated with the areas of acute cortical infarction. No acute hemorrhage or mass lesion is present. Calcified meningioma present along the medial aspect of the left occipital lobe. No other mass lesion is present. Minimal white matter changes are present. The ventricles are of normal size. No significant extraaxial fluid collection is present. The internal auditory canals are within normal limits. Insert normal brainstem Vascular: Flow is present in the major intracranial arteries. Skull and upper cervical spine: The craniocervical junction is normal. Degenerative disc disease present C3-4 and C4-5. Central canal narrowing is greater at C4-5. Degenerative endplate marrow changes are present. Marrow signal is otherwise normal. Sinuses/Orbits: The paranasal sinuses and mastoid air cells are clear. The globes and orbits are within normal limits. MRA HEAD FINDINGS Mild irregularity is present within the cavernous left ICA. Internal carotid arteries are otherwise within normal limits from high cervical segments through the ICA termini bilaterally. The A1 and M1 segments are normal. Signal loss in the proximal inferior left M2 segment is felt to be artifactual. There is segmental narrowing in distal MCA branch vessels without a significant proximal stenosis or occlusion. The anterior communicating artery patent. ACA branch vessels within normal limits. The right vertebral artery is dominant. Right PICA origin visualized and normal. Hypoplastic left V4 segment noted. Vertebrobasilar junction within normal limits. Basilar artery is small. Fetal type posterior cerebral arteries are present bilaterally. There is segmental narrowing of  distal PCA branch vessels without a significant proximal stenosis or occlusion. Basilar artery centrally terminates at the superior cerebellar arteries. MRA NECK FINDINGS The time-of-flight images demonstrate no significant flow disturbance at either carotid bifurcation. Flow is antegrade in the vertebral arteries bilaterally. The right vertebral artery is dominant. IMPRESSION: 1. Scattered areas of acute/subacute cortical infarction involving the high left parietal lobe, superior aspect of the left occipital lobe, and minimal involvement of the right frontal lobe. 2. No other acute intracranial abnormality. 3. Calcified meningioma along the medial aspect of the left occipital lobe. 4. Mild distal small vessel disease without a significant proximal stenosis, aneurysm, or branch vessel occlusion within the Circle of Willis. 5. Signal loss in the proximal inferior left M2 segment is felt to be artifactual. 6. Fetal type posterior cerebral arteries bilaterally. 7. Signal loss in the distal PCA branch vessels is felt to be artifactual. The above was relayed via text pager to Dr. Lorrin Goodell on 10/06/2020 at 17:13 . Electronically Signed   By: San Morelle M.D.   On: 10/06/2020 17:13   MR BRAIN WO CONTRAST  Result Date: 10/06/2020 CLINICAL DATA:  Neuro deficit. Acute, stroke suspected. Confusion. Abnormal speech. Right upper extremity weakness. EXAM: MRI HEAD WITHOUT CONTRAST MRA HEAD WITHOUT CONTRAST MRA NECK WITHOUT CONTRAST TECHNIQUE: Multiplanar, multiecho pulse sequences of the brain and surrounding structures were obtained without intravenous contrast. Angiographic images of the Circle of Willis were obtained using MRA technique without intravenous contrast. Angiographic images of the neck were obtained using MRA technique without intravenous contrast. Carotid stenosis measurements (  when applicable) are obtained utilizing NASCET criteria, using the distal internal carotid diameter as the denominator.  COMPARISON:  CT head without contrast 10/06/2020 FINDINGS: MRI HEAD FINDINGS Brain: Diffusion-weighted images demonstrate scattered areas of acute cortical infarction involving the high left parietal lobe. Focal restricted diffusion is also present in the superior aspect of the left occipital lobe. Minimal involvement of the right frontal lobe noted on image 85 of series 5. T2 hyperintensities are associated with the areas of acute cortical infarction. No acute hemorrhage or mass lesion is present. Calcified meningioma present along the medial aspect of the left occipital lobe. No other mass lesion is present. Minimal white matter changes are present. The ventricles are of normal size. No significant extraaxial fluid collection is present. The internal auditory canals are within normal limits. Insert normal brainstem Vascular: Flow is present in the major intracranial arteries. Skull and upper cervical spine: The craniocervical junction is normal. Degenerative disc disease present C3-4 and C4-5. Central canal narrowing is greater at C4-5. Degenerative endplate marrow changes are present. Marrow signal is otherwise normal. Sinuses/Orbits: The paranasal sinuses and mastoid air cells are clear. The globes and orbits are within normal limits. MRA HEAD FINDINGS Mild irregularity is present within the cavernous left ICA. Internal carotid arteries are otherwise within normal limits from high cervical segments through the ICA termini bilaterally. The A1 and M1 segments are normal. Signal loss in the proximal inferior left M2 segment is felt to be artifactual. There is segmental narrowing in distal MCA branch vessels without a significant proximal stenosis or occlusion. The anterior communicating artery patent. ACA branch vessels within normal limits. The right vertebral artery is dominant. Right PICA origin visualized and normal. Hypoplastic left V4 segment noted. Vertebrobasilar junction within normal limits. Basilar  artery is small. Fetal type posterior cerebral arteries are present bilaterally. There is segmental narrowing of distal PCA branch vessels without a significant proximal stenosis or occlusion. Basilar artery centrally terminates at the superior cerebellar arteries. MRA NECK FINDINGS The time-of-flight images demonstrate no significant flow disturbance at either carotid bifurcation. Flow is antegrade in the vertebral arteries bilaterally. The right vertebral artery is dominant. IMPRESSION: 1. Scattered areas of acute/subacute cortical infarction involving the high left parietal lobe, superior aspect of the left occipital lobe, and minimal involvement of the right frontal lobe. 2. No other acute intracranial abnormality. 3. Calcified meningioma along the medial aspect of the left occipital lobe. 4. Mild distal small vessel disease without a significant proximal stenosis, aneurysm, or branch vessel occlusion within the Circle of Willis. 5. Signal loss in the proximal inferior left M2 segment is felt to be artifactual. 6. Fetal type posterior cerebral arteries bilaterally. 7. Signal loss in the distal PCA branch vessels is felt to be artifactual. The above was relayed via text pager to Dr. Lorrin Goodell on 10/06/2020 at 17:13 . Electronically Signed   By: San Morelle M.D.   On: 10/06/2020 17:13   CT HEAD CODE STROKE WO CONTRAST  Result Date: 10/06/2020 CLINICAL DATA:  Code stroke.  Right-sided weakness. EXAM: CT HEAD WITHOUT CONTRAST TECHNIQUE: Contiguous axial images were obtained from the base of the skull through the vertex without intravenous contrast. COMPARISON:  CT head without contrast 04/02/2015 FINDINGS: Brain: Linear hypodense artifact is noted over the left convexity. No acute infarct, hemorrhage, or mass lesion is present. Mild white matter hypoattenuation is present bilaterally. Basal ganglia are intact. Insular ribbon is normal. The ventricles are of normal size. No significant extraaxial fluid  collection is present.  Vascular: No hyperdense vessel or unexpected calcification. Skull: Calvarium is intact. No focal lytic or blastic lesions are present. No significant extracranial soft tissue lesion is present. Sinuses/Orbits: The paranasal sinuses and mastoid air cells are clear. The globes and orbits are within normal limits. ASPECTS [facility] Stroke Program Early CT Score) - Ganglionic level infarction (caudate, lentiform nuclei, internal capsule, insula, M1-M3 cortex): 7/7 - Supraganglionic infarction (M4-M6 cortex): 3/3 Total score (0-10 with 10 being normal): 10/10 IMPRESSION: 1. No acute intracranial abnormality or significant interval change. 2. Mild white matter disease likely reflects the sequela of chronic microvascular ischemia. 3. Aspects 10/10 These results were called by telephone at the time of interpretation on 10/06/2020 at 12:25 pm to provider [facility] , who verbally acknowledged these results. Electronically Signed   By: San Morelle M.D.   On: 10/06/2020 12:40    PHYSICAL EXAM  Temp:  [98.2 F (36.8 C)-98.6 F (37 C)] 98.4 F (36.9 C) (05/14 0400) Pulse Rate:  [63-97] 67 (05/14 0800) Resp:  [10-20] 11 (05/14 0800) BP: (100-174)/(66-101) 149/68 (05/14 0800) SpO2:  [95 %-100 %] 96 % (05/14 0800) Weight:  [72.8 kg] 72.8 kg (05/13 1244)  General - Well nourished, well developed, in no apparent distress.  Ophthalmologic - fundi not visualized due to noncooperation.  Cardiovascular - Regular rhythm and rate.  Mental Status -  Level of arousal and orientation to place, and person were intact, however, needs two attempts for the month and not oriented to age but knew her DOB. Language including expression, naming, repetition, comprehension was assessed and found intact, but paucity of speech  Cranial Nerves II - XII - II - Visual field intact OU. III, IV, VI - Extraocular movements intact. V - Facial sensation intact bilaterally. VII - Facial movement  intact bilaterally. VIII - Hearing & vestibular intact bilaterally. X - Palate elevates symmetrically. XI - Chin turning & shoulder shrug intact bilaterally. XII - Tongue protrusion intact.  Motor Strength - The patient's strength was normal in all extremities and pronator drift was absent.  Bulk was normal and fasciculations were absent.   Motor Tone - Muscle tone was assessed at the neck and appendages and was normal.  Reflexes - The patient's reflexes were symmetrical in all extremities and she had no pathological reflexes.  Sensory - Light touch, temperature/pinprick were assessed and were symmetrical.    Coordination - The patient had normal movements in the hands with no ataxia or dysmetria.  Tremor was absent.  Gait and Station - deferred.   ASSESSMENT/PLAN Ms. Kaydra Gellner is a 70 y.o. female with history of hypertension, hyperlipidemia, diabetes, OSA admitted for right-sided weakness. tPA given.    Stroke:  left MCA infarct, likely due to left M2 high-grade stenosis  MRI left parietal, MCA/PCA, left MCA/ACA infarcts  MRA head and neck left M2 high-grade stenosis  2D Echo EF 65 to 70%  Consider 30-day cardiac event monitoring as outpatient to rule out A. fib  LDL 159  HgbA1c 7.4  UDS pending  SCDs for VTE prophylaxis  No antithrombotic prior to admission, now on clopidogrel 75 mg daily.  Patient has aspirin allergy.  Patient counseled to be compliant with her antithrombotic medications  Ongoing aggressive stroke risk factor management  Therapy recommendations:  pending  Disposition: Pending  Diabetes  HgbA1c 7.4 goal < 7.0  Uncontrolled  CBG monitoring  SSI  DM education and close PCP follow up  Hypertension . Stable . On home Norvasc and Cozaar  Long  term BP goal 130-150 given left M2 high-grade stenosis  Hyperlipidemia  Home meds: Zetia 10  LDL 159, goal < 70  Now on Zetia 10, patient has allergy to statin  Continue Zetia 10 at  discharge  Other Stroke Risk Factors  Advanced age  Obstructive sleep apnea, on CPAP at home  Other [facility] day # 1  This patient is critically ill due to stroke status post tPA, left MCA high-grade stenosis, uncontrolled diabetes and at significant risk of neurological worsening, death form recurrent stroke, seizure, DKA. This patient's care requires constant monitoring of vital signs, hemodynamics, respiratory and cardiac monitoring, review of multiple databases, neurological assessment, discussion with family, other specialists and medical decision making of high complexity. I spent 40 minutes of neurocritical care time in the care of this patient. I had long discussion with son at bedside and daughter over the phone, updated pt current condition, treatment plan and potential prognosis, and answered all the questions.  They expressed understanding and appreciation.    Rosalin Hawking, MD PhD Stroke Neurology 10/07/2020 9:15 AM    To contact Stroke Continuity provider, please refer to [email or web address] After hours, contact General Neurology

## 2020-10-07 NOTE — Progress Notes (Signed)
OT Cancellation Note  Patient Details Name: Tiffany [name] MRN: [id number] DOB: Jun 11, 1950   Cancelled Treatment:    Reason Eval/Treat Not Completed: Active bedrest order (Will return as schedule allows.)  [place], OTR/L Acute Rehab Pager: [phone] Office: [phone] 10/07/2020, 6:30 AM

## 2020-10-08 LAB — GLUCOSE, CAPILLARY
Glucose-Capillary: 123 mg/dL — ABNORMAL HIGH (ref 70–99)
Glucose-Capillary: 242 mg/dL — ABNORMAL HIGH (ref 70–99)

## 2020-10-08 MED ORDER — ASPIRIN 325 MG PO TBEC: 325.0000 mg | DELAYED_RELEASE_TABLET | Freq: Every day | ORAL | 0 refills | Status: AC

## 2020-10-08 MED ORDER — CYCLOBENZAPRINE HCL 10 MG PO TABS
10.0000 mg | ORAL_TABLET | Freq: Once | ORAL | Status: AC
  Administered 2020-10-08: 10 mg via ORAL
  Filled 2020-10-08: qty 1

## 2020-10-08 MED ORDER — ASPIRIN EC 325 MG PO TBEC
325.0000 mg | DELAYED_RELEASE_TABLET | Freq: Every day | ORAL | Status: DC
  Administered 2020-10-08: 325 mg via ORAL
  Filled 2020-10-08: qty 1

## 2020-10-08 MED ORDER — PRAVASTATIN SODIUM 40 MG PO TABS: 40.0000 mg | ORAL_TABLET | Freq: Every day | ORAL | Status: DC

## 2020-10-08 MED ORDER — PRAVASTATIN SODIUM 40 MG PO TABS: 40.0000 mg | ORAL_TABLET | Freq: Every day | ORAL | 5 refills | Status: DC

## 2020-10-08 MED ORDER — CLOPIDOGREL BISULFATE 75 MG PO TABS: 75.0000 mg | ORAL_TABLET | Freq: Every day | ORAL | 0 refills | Status: DC

## 2020-10-08 MED ORDER — LOSARTAN POTASSIUM 100 MG PO TABS: 50.0000 mg | ORAL_TABLET | Freq: Every day | ORAL | 3 refills | Status: DC

## 2020-10-08 NOTE — Discharge Instructions (Signed)
Driving After a Stroke Driving can be dangerous after a stroke because a stroke can cause physical, emotional, cognitive, and behavioral changes. Damage to your brain and other parts of your nervous system may affect your ability to drive. You may have weakness, stiffness, and pain, and have problems moving, talking, seeing, touching, or problem-solving. A stroke can also cause inability to move (paralysis) on one side of your body. Can I return to driving? Ask your health care provider when it is safe for you to drive. Laws on driving after a stroke vary by state. Your health care provider may recommend that you:  Get a driving evaluation to have your vision, thinking, reaction time, and driving skills tested.  Take a driving rehabilitation program for people who have had a stroke.  Take a driving class or a retraining program.   How is driving affected by a stroke? A family member may be the first to notice that it is not safe for you to drive. You may have problems with:  Your vision.  Talking and communicating.  Weakness, pain, and stiffness in your arms or legs.  Responding to changes on the road.  Using the steering wheel, pedals, and other parts of the car.  Thinking while driving.  Judgment on the road. What are some signs that it may not be safe for me to drive? Signs that driving may be unsafe for you include:  Driving too fast or too slowly.  Needing help from others while driving.  Not paying attention to street signs or signals.  Making bad decisions while driving.  Not keeping enough distance between cars.  Drifting into other lanes.  Becoming confused, angry, or frustrated.  Getting lost in familiar places.  Having accidents while driving. What is adaptive equipment? Adaptive equipment refers to devices that can help people who have had a stroke to drive and do other activities. You may need:  A wheelchair-accessible car.  Special hand controls in  the car.  Pedal extensions for the car.  A seat base to help you stay positioned in your seat.  Lifts and ramps to help you get in and out of the car. Summary  Damage to your brain and other parts of your nervous system may affect your ability to drive.  Ask your health care provider when it is safe for you to drive again. You may need to take steps such as getting a driving evaluation or taking a driving class.  A family member may be the first to notice that it is not safe for you to drive.  You may need adaptive equipment to drive safely. This information is not intended to replace advice given to you by your health care provider. Make sure you discuss any questions you have with your health care provider. Document Revised: 04/25/2017 Document Reviewed: 08/19/2016 Elsevier Patient Education  2021 [place] After a Stroke After a stroke, you may have various problems with thinking (cognitive disability). The types of problems you have will depend on how severe the stroke was and where it was located in the brain. Problems may include:  Problems with short-term memory.  Trouble paying attention.  Trouble communicating or understanding language (aphasia).  A drop in mental ability that may interfere with daily life (dementia).  Trouble with problem-solving and information processing.  Problems with reading, writing, or math.  Problems with your ability to plan and to perform activities in sequence (executive function). These problems can feel  overwhelming. However, with rehabilitation and time to heal, many people have improvement in their symptoms. What causes cognitive disability? A stroke happens when blood cannot flow to certain areas of the brain. When this happens, brain cells die in the affected areas because they cannot get oxygen and nutrients from the blood. Cognitive disability is caused by the death of cells in the areas of the brain  that control thinking. What is cognitive rehabilitation? Cognitive rehabilitation is a program to help you improve your thinking skills after a stroke. Rehabilitation cannot completely reverse the effects of a stroke, but it can help you with memory, problem-solving, and communication skills. Therapy focuses on:  Improving brain function. This may involve activities such as learning to break down tasks into simple steps.  Helping you learn ways to cope with thinking problems. For example, you might learn memory tricks or do activities that stimulate memory, such as naming objects or describing pictures. Cognitive rehabilitation may include:  Speech-language therapy to help you understand and use language to communicate.  Occupational therapy to help you perform daily activities.  Music therapy to help relieve stress, anxiety, and depression. This may involve listening to music, singing, or playing instruments.  Physical therapy to help improve your ability to move and perform actions that involve the muscles (motor functions).   When will therapy start and where will I have therapy? Your health care provider will decide when it is best for you to start therapy. In some cases, people start rehabilitation as soon as their health is stable, which may be 24-48 hours after the stroke. Rehabilitation can take place in a few different places, based on your needs. It may take place in:  The hospital or an in-patient rehabilitation hospital.  An outpatient rehabilitation facility.  A long-term care facility.  A community rehabilitation clinic.  Your home. What are some tools to help after a stroke? There are a number of tools and apps that you can use on your smartphone, personal computer, or tablet to help improve brain function. Some of these apps include:  Calendar reminders or alarm apps to help with memory.  Note-taking or sketch pad apps to help with memory or  communication.  Text-to-speech apps that allow you to listen to what you are reading, which helps your ability to understanding text.  Picture dictionary or picture message apps to help with communication.  E-readers. These can highlight text as it is read aloud, which helps with listening and reading skills. How can my friends or family help during my rehabilitation? During your recovery, it is important that your friends and family members help you work toward more independence. Your caregivers should speak with your health care providers to learn how they can best help you during recovery. This may include working on speech-language or memory exercises at home, or helping with daily tasks and errands. If you have cognitive disability, you may be at risk for injury or accidents at home, such as forgetting to turn off the stove. Friends and family members can help ensure home safety by taking steps such as getting appliances with automatic shut-off features or storing dangerous objects in a secure place. What else should I know about cognitive rehabilitation after a stroke? Having trouble with memory and problem-solving can make you feel alone. You may also have mood changes, anxiety, or depression after a stroke. It is important to:  Stay connected with others through social groups, online support groups, or your community.  Talk to your  friends, family, and caregivers about any emotional problems you are having.  Go to one-on-one or group therapy as suggested by your health care provider.  Stay physically active and exercise as often as suggested by your health care provider. Summary  After a stroke, some people have problems with thinking that affect attention, memory, language, communication, and problem-solving.  Cognitive rehabilitation is a program to help you regain brain function and learn skills to cope with thinking problems.  Rehabilitation cannot completely reverse the effects  of a stroke, but it can help to improve quality of life.  Cognitive rehabilitation may include speech-language therapy, occupational therapy, music therapy, and physical therapy. This information is not intended to replace advice given to you by your health care provider. Make sure you discuss any questions you have with your health care provider. Document Revised: 09/02/2018 Document Reviewed: 08/16/2016 Elsevier Patient Education  2021 [employer] You were admitted for left brain stroke due to left side brain vessel narrowing. We started you on asprin 325mg  and plavix 75mg  daily medication. It needs to continue for 3 months and then asprin alone after. We also continued your zetia for high cholesterol. We also added pravastatin 40mg  daily to further control the cholesterol. We decreased your losartan to 50mg  and continued amlodipine 5mg  for BP control, we do not want your BP too high but not too low either. BP goal 120-150 is good. Please check you BP at home and call your primary care doctor if BP constantly not at goal. You will be called for the appointment to see stroke neurologist. We will also order a 30 day heart monitoring for you to rule out irregular heart beats. You will be called for that arrangement. Call 911 if any new stroke like symptoms.     Ischemic Stroke  An ischemic stroke is the sudden death of brain tissue. This type of stroke happens when part of the brain does not get enough blood. This can cause lifelong change in how the brain works. This change can cause problems in other parts of the body. This condition is an emergency. It must be treated right away. What are the causes? This condition is caused by lower blood flow to part of the brain. This may be due to:  A small clump, or clot, of blood.  A buildup of fatty substance (plaque) in the blood vessels.  An abnormal heart rhythm.  A blocked or damaged artery in the head or neck. Arteries are blood vessels that  move blood away from the heart.  Infection.  Swelling of the arteries in the brain. What increases the risk? Things that you can change  Other medical problems, such as: ? High blood pressure (hypertension). ? Heart disease. ? Diabetes. ? High cholesterol. ? Being very overweight (obese). ? Paused or stopped breathing during sleep (sleep apnea). ? Migraine headache.  Smoking or other tobacco use.  Not being active.  Heavy alcohol use.  Using drugs.  Taking birth control pills. Things that you cannot change  Being older than age 77.  Having had blood clots, stroke, or mini-stroke (transient ischemic attack, TIA) before.  High blood pressure when you are pregnant, in women.  Stroke in your family.  Sickle cell disease.  Disorders that affect how blood clots. What are the signs or symptoms? Symptoms of a stroke normally happen all of a sudden. They can include:  Weakness or loss of feeling in your face, arm, or leg, often on one side of the  body.  Loss of balance.  Loss of controlled, correct movement of your body parts (coordination).  Slurred speech.  Trouble talking or trouble understanding what people say.  Problems with not seeing things correctly.  Feeling dizzy or confused.  Feeling like you may vomit (nauseous) and vomiting.  A very bad headache for no reason. If you can, write down the exact time that you last felt normal and what time you first had symptoms. Tell your doctor. If symptoms come and go, they could be caused by a mini-stroke. Get help right away, even if you feel better. How is this treated? You must get treatment as soon as you have stroke symptoms. Some treatments work better if they are done within 3-6 hours of your first symptoms. You may get medicines that:  Take out or break up the blood clot.  Control blood pressure.  Thin your blood. Other treatments may include:  Treatment for breathing.  Fluids through an IV  tube.  Procedures that make blood flow better. You may need to manage your risk of stroke with medicines and diet changes. After a stroke, you may get therapy to help you get better. Follow these instructions at home: Medicines  Take over-the-counter and prescription medicines only as told by your doctor.  If you were told to take aspirin or another medicine to thin your blood, take it exactly as told. Take it at the same time each day. ? Taking too much of the medicine can cause bleeding. ? If you do not take enough medicine, it may not work as well.  Know the side effects of your medicines. If you are taking a blood thinner, make sure you: ? Hold pressure over any cuts for longer than normal. ? Tell your dentist and other doctors that you take this medicine. ? Avoid activities that could hurt or bruise you. Eating and drinking  Follow instructions from your doctor about diet.  Eat healthy foods.  If you have trouble swallowing: ? Take small bites when eating. ? Eat foods that are soft or pureed. Safety  Follow instructions from your care team about physical activity.  Use a walker or cane as told by your doctor.  Keep your home safe so you do not fall. You may need to: ? Have experts look at your home to make sure it is safe. ? Put grab bars in the bedroom and bathroom. ? Use raised toilets. ? Put a seat in the shower. General instructions  Do not use any products that contain nicotine or tobacco, such as cigarettes, e-cigarettes, and chewing tobacco. If you need help quitting, ask your doctor.  If you drink alcohol: ? Limit how much you use to:  0-1 drink a day for women.  0-2 drinks a day for men. ? Be aware of how much alcohol is in your drink. In the [place] one drink equals one 12 oz bottle of beer (355 mL), one 5 oz glass of wine (148 mL), or one 1 oz glass of hard liquor (44 mL).  If you need help to stop using drugs or alcohol, ask your doctor to refer you to  a program or specialist.  Stay active. Exercise as told.  Wear a medical bracelet as told by your doctor.  Keep all follow-up visits as told by your doctor. Go to visits with all specialists on your care team. This is important. How is this prevented? You can lower your risk of another stroke if you:  Manage high  blood pressure, high cholesterol, diabetes, heart disease, sleep problems, and weight.  Quit smoking, limit alcohol, and stay active. Work with your doctor to care for yourself after a stroke. This may keep you from getting more problems. Get help right away if: You have any signs of a stroke. "BE FAST" is an easy way to remember the main warning signs:  B - Balance. Signs are dizziness, sudden trouble walking, or loss of balance.  E - Eyes. Signs are trouble seeing or a change in how you see.  F - Face. Signs are sudden weakness or loss of feeling of the face, or the face or eyelid drooping on one side.  A - Arms. Signs are weakness or loss of feeling in an arm. This happens suddenly and usually on one side of the body.  S - Speech. Signs are sudden trouble speaking, slurred speech, or trouble understanding what people say.  T - Time. Time to call emergency services. Write down what time symptoms started. You have other signs of a stroke, such as:  A sudden, very bad headache with no known cause.  Feeling like you may vomit.  Vomiting.  A seizure. These symptoms may be an emergency. Do not wait to see if the symptoms will go away. Get medical help right away. Call your local emergency services (911 in the [place] Do not drive yourself to the hospital.   Summary  An ischemic stroke is the sudden death of brain tissue.  Symptoms of a stroke often happen all of a sudden.  You must get treatment as soon as you have stroke symptoms.  Stroke is an emergency. It must be treated right away. This information is not intended to replace advice given to you by your health  care provider. Make sure you discuss any questions you have with your health care provider. Document Revised: 05/10/2019 Document Reviewed: 05/10/2019 Elsevier Patient Education  2021 [employer] Inc[employer]   Stroke Prevention Some medical conditions and lifestyle choices can lead to a higher risk for a stroke. You can help to prevent a stroke by eating healthy foods and exercising. It also helps to not smoke and to manage any health problems you may have. How can this condition affect me? A stroke is an emergency. It should be treated right away. A stroke can lead to brain damage or threaten your life. There is a better chance of surviving and getting better after a stroke if you get medical help right away. What can increase my risk? The following medical conditions may increase your risk of a stroke:  Diseases of the heart and blood vessels (cardiovascular disease).  High blood pressure (hypertension).  Diabetes.  High cholesterol.  Sickle cell disease.  Problems with blood clotting.  Being very overweight.  Sleeping problems (obstructivesleep apnea). Other risk factors include:  Being older than age 34.  A history of blood clots, stroke, or mini-stroke (TIA).  Race, ethnic background, or a family history of stroke.  Smoking or using tobacco products.  Taking birth control pills, especially if you smoke.  Heavy alcohol and drug use.  Not being active. What actions can I take to prevent this? Manage your health conditions  High cholesterol. ? Eat a healthy diet. If this is not enough to manage your cholesterol, you may need to take medicines. ? Take medicines as told by your doctor.  High blood pressure. ? Try to keep your blood pressure below 130/80. ? If your blood pressure cannot be managed through  a healthy diet and regular exercise, you may need to take medicines. ? Take medicines as told by your doctor. ? Ask your doctor if you should check your blood pressure at  home. ? Have your blood pressure checked every year.  Diabetes. ? Eat a healthy diet and get regular exercise. If your blood sugar (glucose) cannot be managed through diet and exercise, you may need to take medicines. ? Take medicines as told by your doctor.  Talk to your doctor about getting checked for sleeping problems. Signs of a problem can include: ? Snoring a lot. ? Feeling very tired.  Make sure that you manage any other conditions you have. Nutrition  Follow instructions from your doctor about what to eat or drink. You may be told to: ? Eat and drink fewer calories each day. ? Limit how much salt (sodium) you use to 1,500 milligrams (mg) each day. ? Use only healthy fats for cooking, such as olive oil, canola oil, and sunflower oil. ? Eat healthy foods. To do this:  Choose foods that are high in fiber. These include whole grains, and fresh fruits and vegetables.  Eat at least 5 servings of fruits and vegetables a day. Try to fill one-half of your plate with fruits and vegetables at each meal.  Choose low-fat (lean) proteins. These include low-fat cuts of meat, chicken without skin, fish, tofu, beans, and nuts.  Eat low-fat dairy products. ? Avoid foods that:  Are high in salt.  Have saturated fat.  Have trans fat.  Have cholesterol.  Are processed or pre-made. ? Count how many carbohydrates you eat and drink each day.   Lifestyle  If you drink alcohol: ? Limit how much you have to:  0-1 drink a day for women who are not pregnant.  0-2 drinks a day for men. ? Know how much alcohol is in your drink. In the [place] one drink equals one 12 oz bottle of beer (365mL), one 5 oz glass of wine (127mL), or one 1 oz glass of hard liquor (35mL).  Do not smoke or use any products that have nicotine or tobacco. If you need help quitting, ask your doctor.  Avoid secondhand smoke.  Do not use drugs. Activity  Try to stay at a healthy weight.  Get at least 30 minutes  of exercise on most days, such as: ? Fast walking. ? Biking. ? Swimming.   Medicines  Take over-the-counter and prescription medicines only as told by your doctor.  Avoid taking birth control pills. Talk to your doctor about the risks of taking birth control pills if: ? You are over 65 years old. ? You smoke. ? You get very bad headaches. ? You have had a blood clot. Where to find more information  American Stroke Association: www.strokeassociation.org Get help right away if:  You or a loved one has any signs of a stroke. "BE FAST" is an easy way to remember the warning signs: ? B - Balance. Dizziness, sudden trouble walking, or loss of balance. ? E - Eyes. Trouble seeing or a change in how you see. ? F - Face. Sudden weakness or loss of feeling of the face. The face or eyelid may droop on one side. ? A - Arms. Weakness or loss of feeling in an arm. This happens all of a sudden and most often on one side of the body. ? S - Speech. Sudden trouble speaking, slurred speech, or trouble understanding what people say. ? T - Time.  Time to call emergency services. Write down what time symptoms started.  You or a loved one has other signs of a stroke, such as: ? A sudden, very bad headache with no known cause. ? Feeling like you may vomit (nausea). ? Vomiting. ? A seizure. These symptoms may be an emergency. Get help right away. Call your local emergency services (911 in the [place]  Do not wait to see if the symptoms will go away.  Do not drive yourself to the hospital. Summary  You can help to prevent a stroke by eating healthy, exercising, and not smoking. It also helps to manage any health problems you have.  Do not smoke or use any products that contain nicotine or tobacco.  Get help right away if you or a loved one has any signs of a stroke. This information is not intended to replace advice given to you by your health care provider. Make sure you discuss any questions you have  with your health care provider. Document Revised: 12/13/2019 Document Reviewed: 12/13/2019 Elsevier Patient Education  [name].   Warning Signs of a Stroke A stroke is a medical emergency and should be treated right away--every second counts. A stroke is caused by a decrease or block in blood flow to the brain. When certain areas of the brain do not get enough oxygen, brain cells begin to die. A stroke can lead to brain damage and can sometimes be life-threatening. However, if a person gets medical treatment right away, he or she has a better chance of surviving and recovering from a stroke. It is very important to be able to recognize the symptoms of a stroke. Types of strokes There are two main types of strokes:  Ischemic stroke. This is the most common type. This stroke happens when a blood vessel that supplies blood to the brain is blocked.  Hemorrhagic stroke. This results from bleeding in the brain when a blood vessel leaks or bursts (ruptures). A transient ischemic attack (TIA) causes stroke-like symptoms that go away quickly. Unlike a stroke, a TIA does not cause permanent damage to the brain. However, the symptoms of a TIA are the same as a stroke. TIAs also require medical treatment right away. Having a TIA is a sign that you are at higher risk for a stroke. Warning signs of a stroke The symptoms of stroke may vary and will reflect the part of the brain that is involved. Symptoms usually happen suddenly. "BE FAST" is an easy way to remember the main warning signs of a stroke:  B - Balance. Signs are dizziness, sudden trouble walking, or loss of balance.  E - Eyes. Signs are trouble seeing or a sudden change in vision.  F - Face. Signs are sudden weakness or numbness of the face, or the face or eyelid drooping on one side.  A - Arms. Signs are weakness or numbness in an arm. This happens suddenly and usually on one side of the body.  S - Speech. Signs are sudden trouble  speaking, slurred speech, or trouble understanding what people say.  T - Time. Time to call emergency services. Write down what time symptoms started. Other signs of a stroke Some less common signs of a stroke include:  A sudden, severe headache with no known cause.  Nausea or vomiting.  Seizure. A stroke may be happening even if only one "BE FAST" symptom is present. These symptoms may represent a serious problem that is an emergency. Do not wait  to see if the symptoms will go away. Get medical help right away. Call your local emergency services (911 in the [place] Do not drive yourself to the hospital.   Summary  A stroke is a medical emergency and should be treated right away--every second counts.  "BE FAST" is an easy way to remember the main warning signs of a stroke.  Call your local emergency services right away if you or someone else has any stroke symptoms, even if the symptoms go away.  Make note of what time the first symptoms appeared. Emergency responders or emergency room staff will need to know this information.  Do not wait to see if symptoms will go away. Call 911 even if only one of the "BE FAST" symptoms appears. This information is not intended to replace advice given to you by your health care provider. Make sure you discuss any questions you have with your health care provider. Document Revised: 12/13/2019 Document Reviewed: 12/13/2019 Elsevier Patient Education  2021 [employer].

## 2020-10-08 NOTE — Discharge Summary (Signed)
Stroke Discharge Summary  Patient ID: Tiffany [name]   MRN: [id number]      DOB: 1950/11/26  Date of Admission: 10/06/2020 Date of Discharge: 10/08/2020  Attending Physician:  Stroke, Md, MD, Stroke MD Consultant(s):    None  Patient's PCP:  Biagio Borg, MD  DISCHARGE DIAGNOSIS:  Active Problems:   Left MCA infarct  Secondary diagnosis  Left MCA M2 high grade stenosis  HTN  HLD  DM  OSA   Allergies as of 10/08/2020      Reactions   Contrast Media [iodinated Diagnostic Agents] Anaphylaxis, Nausea And Vomiting, Swelling, Other (See Comments)   Patients throat closes with nausea and vomiting    Aspirin Nausea And Vomiting, Other (See Comments)   Can take EC aspirin   Gadolinium Derivatives Nausea And Vomiting   Metformin And Related Nausea And Vomiting, Other (See Comments)   Severe nausea and vomiting   Shellfish Allergy Nausea And Vomiting   Tramadol Nausea And Vomiting, Other (See Comments)   Migraines   Penicillins Nausea And Vomiting   Statins Other (See Comments)   Myalgias      Medication List    TAKE these medications   albuterol 108 (90 Base) MCG/ACT inhaler Commonly known as: VENTOLIN HFA Inhale 2 puffs into the lungs every 6 (six) hours as needed for wheezing or shortness of breath.   amLODipine 5 MG tablet Commonly known as: NORVASC TAKE 1 TABLET BY MOUTH EVERY DAY   aspirin 325 MG EC tablet Take 1 tablet (325 mg total) by mouth daily. Start taking on: Oct 09, 2020   clopidogrel 75 MG tablet Commonly known as: PLAVIX Take 1 tablet (75 mg total) by mouth daily. Start taking on: Oct 09, 2020   conjugated estrogens vaginal cream Commonly known as: PREMARIN Place 1 Applicatorful vaginally daily. 3 days a week   EPINEPHrine 0.3 mg/0.3 mL Soaj injection Commonly known as: EPI-PEN Inject 0.3 mLs (0.3 mg total) into the muscle as needed for anaphylaxis.   estradiol 0.1 MG/GM vaginal cream Commonly known as: ESTRACE Place vaginally.    ezetimibe 10 MG tablet Commonly known as: ZETIA Take 1 tablet (10 mg total) by mouth daily.   famotidine 20 MG tablet Commonly known as: Pepcid Take 1 tablet (20 mg total) by mouth 2 (two) times daily.   gabapentin 400 MG capsule Commonly known as: NEURONTIN TAKE 1 CAPSULE BY MOUTH THREE TIMES DAILY What changed: when to take this   HYDROcodone-acetaminophen 5-325 MG tablet Commonly known as: NORCO/VICODIN Take 1 tablet by mouth every 6 (six) hours as needed.   Insulin Pen Needle 32G X 4 MM Misc Use to inject  lantus once daily   Lancets Misc Apply 1 Device topically in the morning and at bedtime. Use twice a day. Once in the morning and once at bed time. DX: E11.9.. Soft Click Lancets, OneTouch Delica Plus Lancing Device   Lantus SoloStar 100 UNIT/ML Solostar Pen Generic drug: insulin glargine INJECT 55 UNITS INTO THE SKIN DAILY. What changed: See the new instructions.   losartan 100 MG tablet Commonly known as: COZAAR Take 0.5 tablets (50 mg total) by mouth daily. What changed: how much to take   ONE TOUCH ULTRA 2 w/Device Kit Apply 1 Device topically in the morning and at bedtime. E11.9   OneTouch Ultra test strip Generic drug: glucose blood Use as instructed twice daily E11.9   pravastatin 40 MG tablet Commonly known as: PRAVACHOL Take 1 tablet (40 mg  total) by mouth daily at 6 PM.   tiZANidine 4 MG tablet Commonly known as: ZANAFLEX TAKE 1 TABLET BY MOUTH EVERY 8 HOURS AS NEEDED FOR MUSCLE SPASM What changed:   how much to take  how to take this  when to take this  reasons to take this  additional instructions   triamcinolone 55 MCG/ACT Aero nasal inhaler Commonly known as: NASACORT Place 2 sprays into the nose daily. What changed:   when to take this  reasons to take this   vitamin B-12 1000 MCG tablet Commonly known as: CYANOCOBALAMIN Take 1 tablet by mouth once daily   zolpidem 10 MG tablet Commonly known as: AMBIEN Take 1 tablet (10  mg total) by mouth at bedtime as needed. What changed: reasons to take this       LABORATORY STUDIES CBC    Component Value Date/Time   WBC 4.8 10/07/2020 0456   RBC 4.56 10/07/2020 0456   HGB 13.2 10/07/2020 0456   HCT 40.2 10/07/2020 0456   PLT 220 10/07/2020 0456   MCV 88.2 10/07/2020 0456   MCH 28.9 10/07/2020 0456   MCHC 32.8 10/07/2020 0456   RDW 13.9 10/07/2020 0456   LYMPHSABS 1.0 10/06/2020 1214   MONOABS 0.3 10/06/2020 1214   EOSABS 0.0 10/06/2020 1214   BASOSABS 0.0 10/06/2020 1214   CMP    Component Value Date/Time   NA 140 10/07/2020 0456   K 3.9 10/07/2020 0456   CL 106 10/07/2020 0456   CO2 26 10/07/2020 0456   GLUCOSE 82 10/07/2020 0456   BUN 8 10/07/2020 0456   CREATININE 0.68 10/07/2020 0456   CREATININE 0.64 01/04/2013 1643   CALCIUM 8.9 10/07/2020 0456   PROT 8.0 10/07/2020 0456   ALBUMIN 3.7 10/07/2020 0456   AST 22 10/07/2020 0456   ALT 24 10/07/2020 0456   ALKPHOS 60 10/07/2020 0456   BILITOT 0.9 10/07/2020 0456   GFRNONAA >60 10/07/2020 0456   GFRNONAA >89 01/04/2013 1643   GFRAA >60 10/07/2015 0628   GFRAA >89 01/04/2013 1643   COAGS Lab Results  Component Value Date   INR 1.0 10/06/2020   Lipid Panel    Component Value Date/Time   CHOL 236 (H) 10/07/2020 0456   TRIG 37 10/07/2020 0456   HDL 70 10/07/2020 0456   CHOLHDL 3.4 10/07/2020 0456   VLDL 7 10/07/2020 0456   LDLCALC 159 (H) 10/07/2020 0456   HgbA1C  Lab Results  Component Value Date   HGBA1C 7.4 (H) 10/07/2020   Urinalysis    Component Value Date/Time   COLORURINE YELLOW 04/26/2020 1311   APPEARANCEUR CLEAR 04/26/2020 1311   LABSPEC 1.025 04/26/2020 1311   PHURINE 6.0 04/26/2020 1311   GLUCOSEU NEGATIVE 04/26/2020 1311   HGBUR NEGATIVE 04/26/2020 1311   HGBUR negative 02/26/2008 0841   BILIRUBINUR NEGATIVE 04/26/2020 1311   KETONESUR TRACE (A) 04/26/2020 1311   PROTEINUR NEGATIVE 06/30/2010 0041   UROBILINOGEN 0.2 04/26/2020 1311   NITRITE NEGATIVE  04/26/2020 1311   LEUKOCYTESUR NEGATIVE 04/26/2020 1311   Urine Drug Screen No results found for: LABOPIA, COCAINSCRNUR, LABBENZ, AMPHETMU, THCU, LABBARB  Alcohol Level No results found for: [facility]   SIGNIFICANT DIAGNOSTIC STUDIES CT HEAD WO CONTRAST  Result Date: 10/07/2020 CLINICAL DATA:  Stroke, follow-up. EXAM: CT HEAD WITHOUT CONTRAST TECHNIQUE: Contiguous axial images were obtained from the base of the skull through the vertex without intravenous contrast. COMPARISON:  Noncontrast brain MRI and MRA head/neck 10/06/2020. Noncontrast head CT 10/06/2020. FINDINGS: Brain: Mild cerebral atrophy.  Redemonstrated acute/early subacute cortical and subcortical infarcts within the left parietooccipital and left temporal lobes. Infarcts within the left parietooccipital lobes have somewhat increased in extent as compared to the brain MRI of 10/06/2020. No significant mass effect. No evidence of hemorrhagic conversion. Unchanged 8 mm possible calcified meningioma along the left aspect of the posterior falx. No extra-axial fluid collection. No midline shift. Vascular: No hyperdense vessel. Skull: Normal. Negative for fracture or focal lesion. Sinuses/Orbits: Visualized orbits show no acute finding. Trace bilateral ethmoid sinus mucosal thickening. IMPRESSION: Redemonstrated acute/early subacute cortical and subcortical infarcts within the left parietooccipital and left temporal lobes. Infarcts within the left parietooccipital lobes have somewhat increased in extent as compared to the brain MRI of 10/06/2020. No significant mass effect. No evidence of hemorrhagic conversion. Unchanged 8 mm possible calcified meningioma along the left aspect of the posterior falx. Electronically Signed   By: Kellie Simmering DO   On: 10/07/2020 16:59   CT HEAD WO CONTRAST  Result Date: 10/06/2020 CLINICAL DATA:  Follow-up right-sided weakness. EXAM: CT HEAD WITHOUT CONTRAST TECHNIQUE: Contiguous axial images were obtained from the base  of the skull through the vertex without intravenous contrast. COMPARISON:  CT head from same day at 1216 hours. FINDINGS: Brain: No evidence of acute infarction, hemorrhage, hydrocephalus, extra-axial collection or mass lesion/mass effect. Vascular: No hyperdense vessel or unexpected calcification. Skull: Normal. Negative for fracture or focal lesion. Sinuses/Orbits: No acute finding. Other: None. IMPRESSION: 1.  No acute intracranial abnormality. Electronically Signed   By: Titus Dubin M.D.   On: 10/06/2020 13:45   MR ANGIO HEAD WO CONTRAST  Result Date: 10/06/2020 CLINICAL DATA:  Neuro deficit. Acute, stroke suspected. Confusion. Abnormal speech. Right upper extremity weakness. EXAM: MRI HEAD WITHOUT CONTRAST MRA HEAD WITHOUT CONTRAST MRA NECK WITHOUT CONTRAST TECHNIQUE: Multiplanar, multiecho pulse sequences of the brain and surrounding structures were obtained without intravenous contrast. Angiographic images of the Circle of Willis were obtained using MRA technique without intravenous contrast. Angiographic images of the neck were obtained using MRA technique without intravenous contrast. Carotid stenosis measurements (when applicable) are obtained utilizing NASCET criteria, using the distal internal carotid diameter as the denominator. COMPARISON:  CT head without contrast 10/06/2020 FINDINGS: MRI HEAD FINDINGS Brain: Diffusion-weighted images demonstrate scattered areas of acute cortical infarction involving the high left parietal lobe. Focal restricted diffusion is also present in the superior aspect of the left occipital lobe. Minimal involvement of the right frontal lobe noted on image 85 of series 5. T2 hyperintensities are associated with the areas of acute cortical infarction. No acute hemorrhage or mass lesion is present. Calcified meningioma present along the medial aspect of the left occipital lobe. No other mass lesion is present. Minimal white matter changes are present. The ventricles are  of normal size. No significant extraaxial fluid collection is present. The internal auditory canals are within normal limits. Insert normal brainstem Vascular: Flow is present in the major intracranial arteries. Skull and upper cervical spine: The craniocervical junction is normal. Degenerative disc disease present C3-4 and C4-5. Central canal narrowing is greater at C4-5. Degenerative endplate marrow changes are present. Marrow signal is otherwise normal. Sinuses/Orbits: The paranasal sinuses and mastoid air cells are clear. The globes and orbits are within normal limits. MRA HEAD FINDINGS Mild irregularity is present within the cavernous left ICA. Internal carotid arteries are otherwise within normal limits from high cervical segments through the ICA termini bilaterally. The A1 and M1 segments are normal. Signal loss in the proximal inferior left M2  segment is felt to be artifactual. There is segmental narrowing in distal MCA branch vessels without a significant proximal stenosis or occlusion. The anterior communicating artery patent. ACA branch vessels within normal limits. The right vertebral artery is dominant. Right PICA origin visualized and normal. Hypoplastic left V4 segment noted. Vertebrobasilar junction within normal limits. Basilar artery is small. Fetal type posterior cerebral arteries are present bilaterally. There is segmental narrowing of distal PCA branch vessels without a significant proximal stenosis or occlusion. Basilar artery centrally terminates at the superior cerebellar arteries. MRA NECK FINDINGS The time-of-flight images demonstrate no significant flow disturbance at either carotid bifurcation. Flow is antegrade in the vertebral arteries bilaterally. The right vertebral artery is dominant. IMPRESSION: 1. Scattered areas of acute/subacute cortical infarction involving the high left parietal lobe, superior aspect of the left occipital lobe, and minimal involvement of the right frontal lobe.  2. No other acute intracranial abnormality. 3. Calcified meningioma along the medial aspect of the left occipital lobe. 4. Mild distal small vessel disease without a significant proximal stenosis, aneurysm, or branch vessel occlusion within the Circle of Willis. 5. Signal loss in the proximal inferior left M2 segment is felt to be artifactual. 6. Fetal type posterior cerebral arteries bilaterally. 7. Signal loss in the distal PCA branch vessels is felt to be artifactual. The above was relayed via text pager to Dr. Lorrin Goodell on 10/06/2020 at 17:13 . Electronically Signed   By: San Morelle M.D.   On: 10/06/2020 17:13   MR ANGIO NECK WO CONTRAST  Result Date: 10/06/2020 CLINICAL DATA:  Neuro deficit. Acute, stroke suspected. Confusion. Abnormal speech. Right upper extremity weakness. EXAM: MRI HEAD WITHOUT CONTRAST MRA HEAD WITHOUT CONTRAST MRA NECK WITHOUT CONTRAST TECHNIQUE: Multiplanar, multiecho pulse sequences of the brain and surrounding structures were obtained without intravenous contrast. Angiographic images of the Circle of Willis were obtained using MRA technique without intravenous contrast. Angiographic images of the neck were obtained using MRA technique without intravenous contrast. Carotid stenosis measurements (when applicable) are obtained utilizing NASCET criteria, using the distal internal carotid diameter as the denominator. COMPARISON:  CT head without contrast 10/06/2020 FINDINGS: MRI HEAD FINDINGS Brain: Diffusion-weighted images demonstrate scattered areas of acute cortical infarction involving the high left parietal lobe. Focal restricted diffusion is also present in the superior aspect of the left occipital lobe. Minimal involvement of the right frontal lobe noted on image 85 of series 5. T2 hyperintensities are associated with the areas of acute cortical infarction. No acute hemorrhage or mass lesion is present. Calcified meningioma present along the medial aspect of the left  occipital lobe. No other mass lesion is present. Minimal white matter changes are present. The ventricles are of normal size. No significant extraaxial fluid collection is present. The internal auditory canals are within normal limits. Insert normal brainstem Vascular: Flow is present in the major intracranial arteries. Skull and upper cervical spine: The craniocervical junction is normal. Degenerative disc disease present C3-4 and C4-5. Central canal narrowing is greater at C4-5. Degenerative endplate marrow changes are present. Marrow signal is otherwise normal. Sinuses/Orbits: The paranasal sinuses and mastoid air cells are clear. The globes and orbits are within normal limits. MRA HEAD FINDINGS Mild irregularity is present within the cavernous left ICA. Internal carotid arteries are otherwise within normal limits from high cervical segments through the ICA termini bilaterally. The A1 and M1 segments are normal. Signal loss in the proximal inferior left M2 segment is felt to be artifactual. There is segmental narrowing in distal MCA  branch vessels without a significant proximal stenosis or occlusion. The anterior communicating artery patent. ACA branch vessels within normal limits. The right vertebral artery is dominant. Right PICA origin visualized and normal. Hypoplastic left V4 segment noted. Vertebrobasilar junction within normal limits. Basilar artery is small. Fetal type posterior cerebral arteries are present bilaterally. There is segmental narrowing of distal PCA branch vessels without a significant proximal stenosis or occlusion. Basilar artery centrally terminates at the superior cerebellar arteries. MRA NECK FINDINGS The time-of-flight images demonstrate no significant flow disturbance at either carotid bifurcation. Flow is antegrade in the vertebral arteries bilaterally. The right vertebral artery is dominant. IMPRESSION: 1. Scattered areas of acute/subacute cortical infarction involving the high left  parietal lobe, superior aspect of the left occipital lobe, and minimal involvement of the right frontal lobe. 2. No other acute intracranial abnormality. 3. Calcified meningioma along the medial aspect of the left occipital lobe. 4. Mild distal small vessel disease without a significant proximal stenosis, aneurysm, or branch vessel occlusion within the Circle of Willis. 5. Signal loss in the proximal inferior left M2 segment is felt to be artifactual. 6. Fetal type posterior cerebral arteries bilaterally. 7. Signal loss in the distal PCA branch vessels is felt to be artifactual. The above was relayed via text pager to Dr. Lorrin Goodell on 10/06/2020 at 17:13 . Electronically Signed   By: San Morelle M.D.   On: 10/06/2020 17:13   MR BRAIN WO CONTRAST  Result Date: 10/06/2020 CLINICAL DATA:  Neuro deficit. Acute, stroke suspected. Confusion. Abnormal speech. Right upper extremity weakness. EXAM: MRI HEAD WITHOUT CONTRAST MRA HEAD WITHOUT CONTRAST MRA NECK WITHOUT CONTRAST TECHNIQUE: Multiplanar, multiecho pulse sequences of the brain and surrounding structures were obtained without intravenous contrast. Angiographic images of the Circle of Willis were obtained using MRA technique without intravenous contrast. Angiographic images of the neck were obtained using MRA technique without intravenous contrast. Carotid stenosis measurements (when applicable) are obtained utilizing NASCET criteria, using the distal internal carotid diameter as the denominator. COMPARISON:  CT head without contrast 10/06/2020 FINDINGS: MRI HEAD FINDINGS Brain: Diffusion-weighted images demonstrate scattered areas of acute cortical infarction involving the high left parietal lobe. Focal restricted diffusion is also present in the superior aspect of the left occipital lobe. Minimal involvement of the right frontal lobe noted on image 85 of series 5. T2 hyperintensities are associated with the areas of acute cortical infarction. No acute  hemorrhage or mass lesion is present. Calcified meningioma present along the medial aspect of the left occipital lobe. No other mass lesion is present. Minimal white matter changes are present. The ventricles are of normal size. No significant extraaxial fluid collection is present. The internal auditory canals are within normal limits. Insert normal brainstem Vascular: Flow is present in the major intracranial arteries. Skull and upper cervical spine: The craniocervical junction is normal. Degenerative disc disease present C3-4 and C4-5. Central canal narrowing is greater at C4-5. Degenerative endplate marrow changes are present. Marrow signal is otherwise normal. Sinuses/Orbits: The paranasal sinuses and mastoid air cells are clear. The globes and orbits are within normal limits. MRA HEAD FINDINGS Mild irregularity is present within the cavernous left ICA. Internal carotid arteries are otherwise within normal limits from high cervical segments through the ICA termini bilaterally. The A1 and M1 segments are normal. Signal loss in the proximal inferior left M2 segment is felt to be artifactual. There is segmental narrowing in distal MCA branch vessels without a significant proximal stenosis or occlusion. The anterior communicating artery patent.  ACA branch vessels within normal limits. The right vertebral artery is dominant. Right PICA origin visualized and normal. Hypoplastic left V4 segment noted. Vertebrobasilar junction within normal limits. Basilar artery is small. Fetal type posterior cerebral arteries are present bilaterally. There is segmental narrowing of distal PCA branch vessels without a significant proximal stenosis or occlusion. Basilar artery centrally terminates at the superior cerebellar arteries. MRA NECK FINDINGS The time-of-flight images demonstrate no significant flow disturbance at either carotid bifurcation. Flow is antegrade in the vertebral arteries bilaterally. The right vertebral artery is  dominant. IMPRESSION: 1. Scattered areas of acute/subacute cortical infarction involving the high left parietal lobe, superior aspect of the left occipital lobe, and minimal involvement of the right frontal lobe. 2. No other acute intracranial abnormality. 3. Calcified meningioma along the medial aspect of the left occipital lobe. 4. Mild distal small vessel disease without a significant proximal stenosis, aneurysm, or branch vessel occlusion within the Circle of Willis. 5. Signal loss in the proximal inferior left M2 segment is felt to be artifactual. 6. Fetal type posterior cerebral arteries bilaterally. 7. Signal loss in the distal PCA branch vessels is felt to be artifactual. The above was relayed via text pager to Dr. Lorrin Goodell on 10/06/2020 at 17:13 . Electronically Signed   By: San Morelle M.D.   On: 10/06/2020 17:13   ECHOCARDIOGRAM COMPLETE  Result Date: 10/07/2020    ECHOCARDIOGRAM REPORT   Patient Name:   [facility] ANN Speaker Date of Exam: 10/07/2020 Medical Rec #:  [id number]        Height:       62.0 in Accession #:    [id number]       Weight:       160.5 lb Date of Birth:  September 09, 1950        BSA:          1.741 m Patient Age:    7 years         BP:           154/67 mmHg Patient Gender: F                HR:           69 bpm. Exam Location:  Inpatient Procedure: 2D Echo, Cardiac Doppler and Color Doppler Indications:    Stroke  History:        Patient has no prior history of Echocardiogram examinations.                 Risk Factors:Hypertension, Diabetes, Dyslipidemia, Sleep Apnea                 and Former Smoker. GERD.  Sonographer:    Clayton Lefort RDCS (AE) Referring Phys: [place]  1. Left ventricular ejection fraction, by estimation, is 65 to 70%. The left ventricle has normal function. The left ventricle has no regional wall motion abnormalities. There is moderate left ventricular hypertrophy. Left ventricular diastolic parameters are indeterminate.  2. Right  ventricular systolic function is normal. The right ventricular size is normal. Tricuspid regurgitation signal is inadequate for assessing PA pressure.  3. A small pericardial effusion is present. The pericardial effusion is posterior to the left ventricle.  4. The mitral valve is grossly normal. Trivial mitral valve regurgitation.  5. The aortic valve is tricuspid. Aortic valve regurgitation is not visualized. Mild to moderate aortic valve sclerosis/calcification is present, without any evidence of aortic stenosis. Aortic valve mean gradient measures 2.0 mmHg.  6. The  inferior vena cava is normal in size with greater than 50% respiratory variability, suggesting right atrial pressure of 3 mmHg. FINDINGS  Left Ventricle: Left ventricular ejection fraction, by estimation, is 65 to 70%. The left ventricle has normal function. The left ventricle has no regional wall motion abnormalities. The left ventricular internal cavity size was normal in size. There is  moderate left ventricular hypertrophy. Left ventricular diastolic parameters are indeterminate. Right Ventricle: The right ventricular size is normal. No increase in right ventricular wall thickness. Right ventricular systolic function is normal. Tricuspid regurgitation signal is inadequate for assessing PA pressure. Left Atrium: Left atrial size was normal in size. Right Atrium: Right atrial size was normal in size. Pericardium: A small pericardial effusion is present. The pericardial effusion is posterior to the left ventricle. Mitral Valve: The mitral valve is grossly normal. There is mild calcification of the mitral valve leaflet(s). Trivial mitral valve regurgitation. MV peak gradient, 4.1 mmHg. The mean mitral valve gradient is 2.0 mmHg. Tricuspid Valve: The tricuspid valve is grossly normal. Tricuspid valve regurgitation is trivial. Aortic Valve: The aortic valve is tricuspid. There is mild aortic valve annular calcification. Aortic valve regurgitation is not  visualized. Mild to moderate aortic valve sclerosis/calcification is present, without any evidence of aortic stenosis. Aortic  valve mean gradient measures 2.0 mmHg. Aortic valve peak gradient measures 4.9 mmHg. Aortic valve area, by VTI measures 2.82 cm. Pulmonic Valve: The pulmonic valve was grossly normal. Pulmonic valve regurgitation is trivial. Aorta: The aortic root is normal in size and structure. Venous: The inferior vena cava is normal in size with greater than 50% respiratory variability, suggesting right atrial pressure of 3 mmHg. IAS/Shunts: No atrial level shunt detected by color flow Doppler.  LEFT VENTRICLE PLAX 2D LVIDd:         3.60 cm  Diastology LVIDs:         2.10 cm  LV e' medial:    6.96 cm/s LV PW:         1.20 cm  LV E/e' medial:  10.7 LV IVS:        1.60 cm  LV e' lateral:   8.05 cm/s LVOT diam:     2.10 cm  LV E/e' lateral: 9.3 LV SV:         56 LV SV Index:   32 LVOT Area:     3.46 cm  RIGHT VENTRICLE             IVC RV Basal diam:  3.00 cm     IVC diam: 1.30 cm RV S prime:     17.10 cm/s TAPSE (M-mode): 2.2 cm LEFT ATRIUM             Index       RIGHT ATRIUM           Index LA diam:        2.70 cm 1.55 cm/m  RA Area:     17.30 cm LA Vol (A2C):   35.7 ml 20.51 ml/m RA Volume:   46.40 ml  26.65 ml/m LA Vol (A4C):   31.6 ml 18.15 ml/m LA Biplane Vol: 35.0 ml 20.10 ml/m  AORTIC VALVE AV Area (Vmax):    2.81 cm AV Area (Vmean):   2.92 cm AV Area (VTI):     2.82 cm AV Vmax:           111.00 cm/s AV Vmean:          67.400 cm/s AV VTI:  0.200 m AV Peak Grad:      4.9 mmHg AV Mean Grad:      2.0 mmHg LVOT Vmax:         90.10 cm/s LVOT Vmean:        56.900 cm/s LVOT VTI:          0.163 m LVOT/AV VTI ratio: 0.82  AORTA Ao Root diam: 2.90 cm Ao Asc diam:  2.80 cm MITRAL VALVE MV Area (PHT): 2.95 cm    SHUNTS MV Area VTI:   2.04 cm    Systemic VTI:  0.16 m MV Peak grad:  4.1 mmHg    Systemic Diam: 2.10 cm MV Mean grad:  2.0 mmHg MV Vmax:       1.01 m/s MV Vmean:      61.8 cm/s  MV Decel Time: 257 msec MV E velocity: 74.60 cm/s MV A velocity: 93.80 cm/s MV E/A ratio:  0.80 Rozann Lesches MD Electronically signed by Rozann Lesches MD Signature Date/Time: 10/07/2020/12:43:39 PM    Final    CT HEAD CODE STROKE WO CONTRAST  Result Date: 10/06/2020 CLINICAL DATA:  Code stroke.  Right-sided weakness. EXAM: CT HEAD WITHOUT CONTRAST TECHNIQUE: Contiguous axial images were obtained from the base of the skull through the vertex without intravenous contrast. COMPARISON:  CT head without contrast 04/02/2015 FINDINGS: Brain: Linear hypodense artifact is noted over the left convexity. No acute infarct, hemorrhage, or mass lesion is present. Mild white matter hypoattenuation is present bilaterally. Basal ganglia are intact. Insular ribbon is normal. The ventricles are of normal size. No significant extraaxial fluid collection is present. Vascular: No hyperdense vessel or unexpected calcification. Skull: Calvarium is intact. No focal lytic or blastic lesions are present. No significant extracranial soft tissue lesion is present. Sinuses/Orbits: The paranasal sinuses and mastoid air cells are clear. The globes and orbits are within normal limits. ASPECTS [facility] Stroke Program Early CT Score) - Ganglionic level infarction (caudate, lentiform nuclei, internal capsule, insula, M1-M3 cortex): 7/7 - Supraganglionic infarction (M4-M6 cortex): 3/3 Total score (0-10 with 10 being normal): 10/10 IMPRESSION: 1. No acute intracranial abnormality or significant interval change. 2. Mild white matter disease likely reflects the sequela of chronic microvascular ischemia. 3. Aspects 10/10 These results were called by telephone at the time of interpretation on 10/06/2020 at 12:25 pm to provider [facility] , who verbally acknowledged these results. Electronically Signed   By: San Morelle M.D.   On: 10/06/2020 12:40      HISTORY OF PRESENT ILLNESS Tiffany [name] is a 70 yo female with a PMHx of OSA  without use of CPAP, HTN, HLD, DM II, and DJD who presents today as a code stroke. Per EMS, patient was LKW at 1030. Then she had right sided weakness. By the time EMS arrived, those symptoms subsided. However, patient got up from chair and had flaccidity of Right side, but did not fall. At that point, code stroke was activated in the field. BP 170s and CBG 216 en route. No personal history of stroke, but FMHx of stroke in patient's brother.   After brief exam at ED bridge with airway clearance, patient was taken emergently to CT suite. CT showed no acute findings. CTA was ordered, but cancelled due to patient allergy to contrast.   After CT, patient taken back to ED for further testing.   In review of chart back to 2017, nothing significant in notes.   Patient will be admitted by neurology due to tPA administration.   Low  intensity monitoring for Optimist trial.   +++Note, after tPA finished, patient c/o frontal HA and nausea. Patient taken back to CT with no evidence of bleed.    LKW: 1030  hours tpa given?: Yes, started at 1230 hours.  IR Thrombectomy?: no MRS 0   HOSPITAL COURSE Ms. Stephany Poorman is a 70 y.o. female with history of hypertension, hyperlipidemia, diabetes, OSA admitted for right-sided weakness. tPA given.    Stroke:  left MCA infarct, likely due to left M2 high-grade stenosis  MRI left parietal, MCA/PCA, left MCA/ACA infarcts  MRA head and neck left M2 high-grade stenosis  2D Echo EF 65 to 70%  Will do 30-day cardiac event monitoring as outpatient to rule out A. Fib (Trish messaged)  LDL 159  HgbA1c 7.4  UDS pending  SCDs for VTE prophylaxis  No antithrombotic prior to admission, now on ASA EC 325 and clopidogrel 75 mg daily for 3 months and then ASA alone.   Patient counseled to be compliant with her antithrombotic medications  Ongoing aggressive stroke risk factor management  Therapy recommendations: out pt PT  Disposition: home  today  Diabetes  HgbA1c 7.4 goal < 7.0  Uncontrolled  CBG monitoring  SSI  DM education and close PCP follow up  Hypertension  Stable  On home Norvasc and Cozaar  Long term BP goal 130-150 given left M2 high-grade stenosis  Hyperlipidemia  Home meds: Zetia 10  LDL 159, goal < 70  Pt OK to try pravastatin 40 (hx of statin causing myalgia)  Continue Zetia 10 and statin at discharge  Other Stroke Risk Factors  Advanced age  Obstructive sleep apnea, on CPAP at home  Other Active Problems  DISCHARGE EXAM Blood pressure 137/76, pulse 72, temperature 98 F (36.7 C), temperature source Oral, resp. rate 11, height 5' 2"  (1.575 m), weight 72.8 kg, SpO2 97 %.  General - Well nourished, well developed, in no apparent distress.  Ophthalmologic - fundi not visualized due to noncooperation.  Cardiovascular - Regular rhythm and rate.  Mental Status -  Level of arousal and orientation to place, time and person were intact. Language including expression, naming, repetition, comprehension was assessed and found intact, but paucity of speech.  Cranial Nerves II - XII - II - Visual field intact OU. III, IV, VI - Extraocular movements intact. V - Facial sensation intact bilaterally. VII - Facial movement intact bilaterally. VIII - Hearing & vestibular intact bilaterally. X - Palate elevates symmetrically. XI - Chin turning & shoulder shrug intact bilaterally. XII - Tongue protrusion intact.  Motor Strength - The patient's strength was normal in all extremities and pronator drift was absent.  Bulk was normal and fasciculations were absent.   Motor Tone - Muscle tone was assessed at the neck and appendages and was normal.  Reflexes - The patient's reflexes were symmetrical in all extremities and she had no pathological reflexes.  Sensory - Light touch, temperature/pinprick were assessed and were symmetrical.    Coordination - The patient had normal  movements in the hands with no ataxia or dysmetria.  Tremor was absent.  Gait and Station - deferred.  Discharge Diet       Diet   Diet Heart Room service appropriate? Yes; Fluid consistency: Thin   liquids  DISCHARGE PLAN  Disposition:  home  aspirin 325 mg daily and clopidogrel 75 mg daily for secondary stroke prevention for 3 months then ASA alone.  Ongoing stroke risk factor control by Primary Care Physician at time  of discharge  Follow-up PCP Biagio Borg, MD in 2 weeks.  Follow-up in [facility] in 4 weeks, office to schedule an appointment.   Dc instructions You were admitted for left brain stroke due to left side brain vessel narrowing. We started you on asprin 388m and plavix 729mdaily medication. It needs to continue for 3 months and then asprin alone after. We also continued your zetia for high cholesterol. We also added pravastatin 4027maily to further control the cholesterol. We decreased your losartan to 42m56md continued amlodipine 5mg 41m BP control, we do not want your BP too high but not too low either. BP goal 120-150 is good. Please check you BP at home and call your primary care doctor if BP constantly not at goal. You will be called for the appointment to see stroke neurologist. We will also order a 30 day heart monitoring for you to rule out irregular heart beats. You will be called for that arrangement. Call 911 if any new stroke like symptoms.   40 minutes were spent preparing discharge.  JindoRosalin HawkingPhD Stroke Neurology 10/08/2020 12:10 PM

## 2020-10-08 NOTE — Evaluation (Signed)
Speech Language Pathology Evaluation Patient Details Name: Tiffany [name] MRN: [id number] DOB: 1950/07/23 Today's Date: 10/08/2020 Time: 7425-9563 SLP Time Calculation (min) (ACUTE ONLY): 33 min  Problem List:  Patient Active Problem List   Diagnosis Date Noted  . Stroke ([place]) 10/06/2020  . Lipoma 08/26/2020  . Asthma 08/26/2020  . Bilateral leg pain 08/25/2020  . Encounter for well adult exam with abnormal findings 05/06/2020  . Allergic rhinitis 05/06/2020  . Statin myopathy 05/02/2020  . Left shoulder pain 11/01/2019  . Chronic low back pain 05/03/2019  . B12 deficiency 05/03/2019  . Left leg swelling 11/16/2018  . Left knee pain 11/16/2018  . Low back pain 05/14/2018  . Abnormal SPEP 11/18/2017  . Sternoclavicular joint pain, left 08/19/2017  . Cervical radiculopathy at C8 05/15/2017  . Pre-ulcerative corn or callous 03/27/2017  . Vitamin D deficiency 03/27/2017  . Elevated total protein 03/27/2017  . Bilateral shoulder pain 03/27/2017  . Medicare annual wellness visit, subsequent 03/06/2016  . Spondylolisthesis at L4-L5 level 10/06/2015  . Acute frontal sinusitis 07/14/2015  . Sleep disturbance 07/14/2015  . Knee pain, left 11/10/2014  . Sprain of ankle 04/11/2014  . GERD (gastroesophageal reflux disease) 03/07/2014  . Prolapse of vaginal wall with midline cystocele, grade 1 08/23/2013  . Vaginal dryness 05/14/2012  . Dry mouth 05/13/2012  . Depression and insomnia 07/19/2010  . Multilevel degenerative disc disease of cervical and lumbar spine  01/16/2008  . Essential hypertension 01/01/2008  . Diabetes ([place]) 03/18/2007  . Hyperlipidemia 02/13/2007  . SLEEP APNEA 02/13/2007   Past Medical History:  Past Medical History:  Diagnosis Date  . Degenerative joint disease   . Diabetes mellitus   . Headache   . Hyperlipidemia   . Hypertension   . Sleep apnea    stopped using it   Past Surgical History:  Past Surgical History:  Procedure Laterality Date  .  ABDOMINAL HYSTERECTOMY  1976  . BACK SURGERY    . KNEE ARTHROSCOPY     left x 2  . LUMBAR LAMINECTOMY  1989   HPI:  75 Right handed F p/w R sided weakness with poor coordination in R hand and R extinction. tPA risks and benefits discussed and patient opted for tPA. Cerebral infarction due to embolism of  left middle cerebral artery. PMH includes HTN, OSA, IDDM II, and HLD   Assessment / Plan / Recommendation Clinical Impression  Pt was seen for a cognitive-linguistic evaluation and she presents with a mild, mixed expressive/receptive aphasia c/b by difficulty with sentence repetition, following commands, and confrontational naming.  Cognitive deficits were also observed in the areas of short-term memory and sustained attention.  No dysarthria was observed, and pt was fully intelligible at the conversation level.  Pt reported that at baseline she lives alone and that she is fully independent with all IADLs including medication and financial management.  Immediate family lives in the vicinity and is available to help PRN per pt report.  Recommend outpatient speech therapy targeting language and cognitive deficits.  Additionally recommend supervision and assistance with IADLs (particularly medications and finances) at time of discharge.  Discussed results and recommendations with pt and RN.    SLP Assessment  SLP Recommendation/Assessment: Patient needs continued Speech Lanaguage Pathology Services SLP Visit Diagnosis: Aphasia (R47.01);Cognitive communication deficit (R41.841)    Follow Up Recommendations  Outpatient SLP    Frequency and Duration min 1 x/week  1 week      SLP Evaluation Cognition  Overall Cognitive Status: Impaired/Different  from baseline Arousal/Alertness: Awake/alert Orientation Level: Oriented X4 Attention: Sustained;Focused Focused Attention: Appears intact Sustained Attention: Impaired Sustained Attention Impairment: Verbal complex Memory: Impaired Memory  Impairment: Decreased short term memory Decreased Short Term Memory: Verbal basic Immediate Memory Recall: Sock;Blue;Bed Memory Recall Sock: With Cue Memory Recall Blue: With Cue Memory Recall Bed: Not able to recall Awareness: Appears intact Problem Solving: Appears intact Safety/Judgment: Appears intact       Comprehension  Auditory Comprehension Overall Auditory Comprehension: Impaired Yes/No Questions: Within Functional Limits Commands: Impaired One Step Basic Commands: 75-100% accurate Two Step Basic Commands: 75-100% accurate Multistep Basic Commands: 25-49% accurate Conversation: Complex Reading Comprehension Reading Status: Not tested    Expression Expression Primary Mode of Expression: Verbal Verbal Expression Overall Verbal Expression: Impaired Initiation: No impairment Level of Generative/Spontaneous Verbalization: Conversation Repetition: Impaired Level of Impairment: Sentence level Naming: Impairment Responsive: Not tested Confrontation: Impaired Convergent: 75-100% accurate Divergent: Not tested Pragmatics: No impairment Written Expression Dominant Hand: Right   Oral / Motor  Oral Motor/Sensory Function Overall Oral Motor/Sensory Function: Within functional limits Motor Speech Overall Motor Speech: Appears within functional limits for tasks assessed   GO                   [name] M.S., Protection Office: [phone]  [place] 10/08/2020, 2:25 PM

## 2020-10-09 ENCOUNTER — Encounter: Payer: Self-pay | Admitting: *Deleted

## 2020-10-09 ENCOUNTER — Other Ambulatory Visit: Payer: Self-pay | Admitting: Physician Assistant

## 2020-10-09 DIAGNOSIS — R9431 Abnormal electrocardiogram [ECG] [EKG]: Secondary | ICD-10-CM | POA: Diagnosis not present

## 2020-10-09 DIAGNOSIS — I639 Cerebral infarction, unspecified: Secondary | ICD-10-CM | POA: Diagnosis not present

## 2020-10-09 NOTE — Progress Notes (Signed)
Patient ID: Tiffany [name], female   DOB: 1951/05/25, 70 y.o.   MRN: [id number] Patient enrolled for [employer] to ship a 30 day cardiac event monitor to her home. Letter with instructions mailed to patient.

## 2020-10-10 ENCOUNTER — Other Ambulatory Visit: Payer: Self-pay | Admitting: Internal Medicine

## 2020-10-13 ENCOUNTER — Ambulatory Visit (INDEPENDENT_AMBULATORY_CARE_PROVIDER_SITE_OTHER): Payer: Medicare Other

## 2020-10-13 DIAGNOSIS — R9431 Abnormal electrocardiogram [ECG] [EKG]: Secondary | ICD-10-CM

## 2020-10-13 DIAGNOSIS — I639 Cerebral infarction, unspecified: Secondary | ICD-10-CM

## 2020-10-16 ENCOUNTER — Other Ambulatory Visit: Payer: Self-pay | Admitting: *Deleted

## 2020-10-16 ENCOUNTER — Encounter: Payer: Self-pay | Admitting: *Deleted

## 2020-10-16 NOTE — Patient Outreach (Signed)
[name]) Care Management  10/16/2020  Tiffany [name] Aug 19, 1950 [id number]   [facility] incoming outreach   Tiffany Tiffany [name] was referred to [facility] on 10/16/20 for EMMI stroke referral for red alert reason: Scheduled a follow-up appointment? I Don't Know (after a call to her on Saturday 10/14/20 at 14)  Insurance blue cross and blue shield medicare [facility] admission x 1 Last [place] admission 10/06/20-10/08/20 for left MCA infarct -high grade stenosis, hypertension, diabetes, obstructive sleep disorder, hyperlipidemia  Tiffany Duggar returned a call to [facility] RN CM  Patient is able to verify HIPAA ([place] and Accountability Act) identifiers Reviewed and addressed the purpose of the follow up call with the patient  Consent: [facility] RN CM reviewed [facility] services with patient. Patient gave verbal consent for services.  Initial assessment Tiffany Bressi reports she is making improvements at home She states she is now more oriented and now able to write her name better with her weak right hand Still drop items related to right side weakness and gets frustrated She denies worsening symptoms today   She confirms she lives alone but had family staying for awhile  She has wonderful support from her son, Tiffany [name] and grand daughter, Tiffany [name] visited today to assist her with completing her medicine containers after picking up filled prescriptions.   EMMI  Tiffany [name] confirms audio concerns with the documented answers for EMMI She confirms she does have follow up with her primary care provider (PCP) and her outpatient rehab sessions. She reports her son and grand daughter are alternating in transporting her to her appointments.   She did not have the listed neurology and cardiology follow appointments  These were provided to her as she wrote them down at her own pace  She voiced appreciation  DME eyeglasses walker 3 N 1  [facility] services and unsuccessful  outreach letter reviewed She wrote [facility] RN CM's contact information down   Past Medical History:  Diagnosis Date  . Degenerative joint disease   . Diabetes mellitus   . Headache   . Hyperlipidemia   . Hypertension   . Sleep apnea    stopped using it    [facility] RN CM will outreach to patient as agreed within the next 14-21 business days Pt agrees to plan of care  Goals Addressed              This Visit's Progress     Patient Stated   .  [facility]) Keep or Improve My Strength-Stroke (pt-stated)   On track     Timeframe:  Long-Range Goal Priority:  High Start Date:         10/16/20                    Expected End Date:     12/22/20                  Follow Up Date 10/31/20   - eat healthy to increase strength - increase activity or exercise time a little every week - join an activity or exercise group in community    Notes:     .  [facility]) Make and Keep All Appointments (pt-stated)   On track     Timeframe:  Short-Term Goal Priority:  High Start Date:                10/16/20  Expected End Date:       11/23/20                Follow Up Date 11/01/2020   - ask family or friend for a ride - call to cancel if needed - keep a calendar with appointment dates     Notes:         [name] [name], RN, BSN, [place] Coordinator Office number [phone] Main [facility] number [phone] Fax number [phone]

## 2020-10-16 NOTE — Patient Outreach (Signed)
[facility]) Care Management  10/16/2020  Tiffany [name] 1951-03-14 [id number]   [facility] Unsuccessful outreach  Tiffany [name] was referred to [facility] on 10/16/20 for EMMI stroke referral for red alert reason: Scheduled a follow-up appointment? I Don't Know (after a call to her on Saturday 10/14/20 at 43)  Insurance blue cross and blue shield medicare [facility] admission x 1 Last  admission 10/06/20-10/08/20 for left MCA infarct -high grade stenosis, hypertension, diabetes, obstructive sleep disorder, hyperlipidemia  Outreach attempt to the home number  No answer. [facility] RN CM left HIPAA [facility] Portability and Accountability Act) compliant voicemail message along with CM's contact info.    With review of EPIC chart [facility] RN CM notes pt with pcp Cathlean Cower) follow up scheduled for 10/31/20  2 pm and neurology follow up on 11/16/20  She is scheduled for outpatient rehab on 10/18/20    Plan: [place] scheduled this patient for another call attempt within 4-7 business days Sent unsuccessful outreach letter   [place]. Lavina Hamman, RN, BSN, [place] Coordinator Office number [phone] Mobile number [phone]  Main [facility] number [phone] Fax number [phone]

## 2020-10-17 ENCOUNTER — Other Ambulatory Visit: Payer: Self-pay | Admitting: *Deleted

## 2020-10-17 NOTE — Patient Outreach (Signed)
[place] [facility]) Care Management  10/17/2020  Azha Constantin [birth year] [id number]   [place] coordination  [facility] welcome letter with consent mailed  [facility] RN CM will outreach to patient as agreed within the next 14-21 business days Pt agrees to plan of care   [place]. Lavina Hamman, RN, BSN, [place] Coordinator Office number [phone] Mobile number [phone]  Main [facility] number [phone] Fax number [phone]

## 2020-10-18 ENCOUNTER — Ambulatory Visit: Payer: Medicare Other | Attending: Neurology | Admitting: Physical Therapy

## 2020-10-18 ENCOUNTER — Other Ambulatory Visit: Payer: Self-pay

## 2020-10-18 ENCOUNTER — Ambulatory Visit: Payer: Medicare Other | Admitting: Occupational Therapy

## 2020-10-18 VITALS — BP 153/95 | HR 81

## 2020-10-18 DIAGNOSIS — R2689 Other abnormalities of gait and mobility: Secondary | ICD-10-CM | POA: Insufficient documentation

## 2020-10-18 DIAGNOSIS — R2681 Unsteadiness on feet: Secondary | ICD-10-CM | POA: Insufficient documentation

## 2020-10-18 DIAGNOSIS — M6281 Muscle weakness (generalized): Secondary | ICD-10-CM | POA: Diagnosis not present

## 2020-10-18 NOTE — Therapy (Signed)
[facility] [address] [place], [place], [address] Phone: [phone]   Fax:  [phone]  Physical Therapy Evaluation  Patient Details  Name: Tiffany [name] MRN: [id number] Date of Birth: 10/03/1950 Referring Provider (PT): Rosalin Hawking, MD   Encounter Date: 10/18/2020   PT End of Session - 10/18/20 1439    Visit Number 1    Number of Visits 9    Date for PT Re-Evaluation 12/02/20   4-wk POC, 82 day cert for scheduling   Authorization Type BCBS Medicare    PT Start Time 4970    PT Stop Time 1528    PT Time Calculation (min) 45 min    Activity Tolerance Patient tolerated treatment well    Behavior During Therapy [facility] for tasks assessed/performed           Past Medical History:  Diagnosis Date  . Degenerative joint disease   . Diabetes mellitus   . Headache   . Hyperlipidemia   . Hypertension   . Sleep apnea    stopped using it    Past Surgical History:  Procedure Laterality Date  . ABDOMINAL HYSTERECTOMY  1976  . BACK SURGERY    . KNEE ARTHROSCOPY     left x 2  . LUMBAR LAMINECTOMY  1989    Vitals:   10/18/20 1510 10/18/20 1529  BP: (!) 151/95 (!) 153/95  Pulse: 94 81      Subjective Assessment - 10/18/20 1438    Subjective My hand is weaker, drop things.  Sometimes I stumble a little bit.  R side was affected.  Get tired with my walking.  Prior to CVA, was independent.    Patient is accompained by: Family member   granddaughter, [name]   Pertinent History PMH includes HTN, OSA, IDDM II, and HLD.    Patient Stated Goals Pt's goals for therapy are to get R hand and R side stronger.    Currently in Pain? No/denies              [facility] PT Assessment - 10/18/20 1452      Assessment   Medical Diagnosis acute ischemic stroke    Referring Provider (PT) Rosalin Hawking, MD    Onset Date/Surgical Date 10/08/20    Hand Dominance Right      Precautions   Precautions Fall    Precaution Comments  Not currently driving      Balance Screen   Has the patient fallen in the past 6 months No    Has the patient had a decrease in activity level because of a fear of falling?  No    Is the patient reluctant to leave their home because of a fear of falling?  No      Home Environment   Living Environment Private residence    Living Arrangements Alone    Type of [place] Access Level entry   ground floor   Home Layout One level    [place] - single point      Prior Function   Level of Independence Independent    Leisure Cleans and cooks, does grocery shopping      Cognition   Overall Cognitive Status Within Functional Limits for tasks assessed    Behaviors Other (comment)   granddaughter reports sometimes slow to find words, but it is better     Observation/Other Assessments   Focus on Therapeutic Outcomes (FOTO)  Staff did not capture (  pt not input into FOTO system)      Sensation   Light Touch Appears Intact    Proprioception Appears Intact      ROM / Strength   AROM / PROM / Strength Strength      Strength   Strength Assessment Site Hip;Knee;Ankle    Right/Left Hip Right;Left    Right Hip Flexion 4+/5    Left Hip Flexion 4+/5    Right/Left Knee Right;Left    Right Knee Flexion 5/5    Right Knee Extension 5/5    Left Knee Flexion 5/5    Left Knee Extension 5/5    Right/Left Ankle Right;Left    Right Ankle Dorsiflexion 4/5    Left Ankle Dorsiflexion 4/5      Transfers   Transfers Sit to Stand;Stand to Sit    Sit to Stand 5: Supervision;Without upper extremity assist;From chair/3-in-1    Five time sit to stand comments  11.25    Stand to Sit 5: Supervision;Without upper extremity assist;To chair/3-in-1      Ambulation/Gait   Ambulation/Gait Yes    Ambulation/Gait Assistance 5: Supervision    Ambulation Distance (Feet) 200 Feet    Assistive device None    Gait Pattern Step-through pattern;Decreased arm swing - right;Decreased step length -  right;Narrow base of support    Ambulation Surface Level;Indoor    Gait velocity 12.13 sec = 2.7 ft/sec      Standardized Balance Assessment   Standardized Balance Assessment Timed Up and Go Test      Timed Up and Go Test   Normal TUG (seconds) 14.37    Cognitive TUG (seconds) 14.56    TUG Comments Scores >13.5 sec indicate increased fall risk.      Functional Gait  Assessment   Gait assessed  Yes    Gait Level Surface Walks 20 ft in less than 7 sec but greater than 5.5 sec, uses assistive device, slower speed, mild gait deviations, or deviates 6-10 in outside of the 12 in walkway width.   7.87   Change in Gait Speed Able to change speed, demonstrates mild gait deviations, deviates 6-10 in outside of the 12 in walkway width, or no gait deviations, unable to achieve a major change in velocity, or uses a change in velocity, or uses an assistive device.    Gait with Horizontal Head Turns Performs head turns smoothly with slight change in gait velocity (eg, minor disruption to smooth gait path), deviates 6-10 in outside 12 in walkway width, or uses an assistive device.    Gait with Vertical Head Turns Performs task with slight change in gait velocity (eg, minor disruption to smooth gait path), deviates 6 - 10 in outside 12 in walkway width or uses assistive device    Gait and Pivot Turn Turns slowly, requires verbal cueing, or requires several small steps to catch balance following turn and stop   2.6 sec but unsteady   Step Over Obstacle Is able to step over one shoe box (4.5 in total height) but must slow down and adjust steps to clear box safely. May require verbal cueing.    Gait with Narrow Base of Support Is able to ambulate for 10 steps heel to toe with no staggering.    Gait with Eyes Closed Walks 20 ft, slow speed, abnormal gait pattern, evidence for imbalance, deviates 10-15 in outside 12 in walkway width. Requires more than 9 sec to ambulate 20 ft.   13.56 sec veers to L  Ambulating  Backwards Walks 20 ft, uses assistive device, slower speed, mild gait deviations, deviates 6-10 in outside 12 in walkway width.   13.96   Steps Two feet to a stair, must use rail.   Hx of difficulty with stairs due to knee, back pain   Total Score 17    FGA comment: Scores <22/30 indicates increased fall risk.                      Objective measurements completed on examination: See above findings.               PT Education - 10/18/20 1439    Education Details PT eval results, POC    Person(s) Educated Patient    Methods Explanation    Comprehension Verbalized understanding               PT Long Term Goals - 10/18/20 1725      PT LONG TERM GOAL #1   Title Pt will be independent with HEP for improved strength, balance, transfers, and gait.  TARGET 11/17/2020    Time 4    Period Weeks    Status New      PT LONG TERM GOAL #2   Title Pt will improve FGA score to at least 21/30 to decrease fall risk.    Baseline 17/30    Time 4    Period Weeks    Status New      PT LONG TERM GOAL #3   Title Pt will improve TUG score to less than or equal to 13.5 sec for decreased fall risk.    Baseline 14.37 sec    Time 4    Period Weeks    Status New      PT LONG TERM GOAL #4   Title Pt will improve gait velocity to at least 3 ft/sec for improved gait efficiency in community.    Baseline 2.7 ft/sec    Time 4    Period Weeks    Status New                  Plan - 10/18/20 1439    Clinical Impression Statement Pt is a 70 yo right handed female who presents to ED 10/06/20  with R sided weakness. Pt was found to have cerebral infarction due to embolism of  left middle cerebral artery; she received tPA upon admission.  She was discharged home with recommendation of OPPT on 10/08/20.  She presents to OPPT with decreased strength, decreased balance, abnormal gait pattern.  Prior to CVA, she was independent and relays that she is not back to her baseline.  She  will benefit from skilled PT to address the above stated deficits to decrease fall risk and improve overall functional mobility and independence.    Personal Factors and Comorbidities Comorbidity 3+    Comorbidities PMH includes HTN, OSA, IDDM II, and HLD; pt reports PMH of L knee pain and back pain limiting stairs as well as reported RLE shorter than LLE    Examination-Activity Limitations Locomotion Level;Transfers;Stand    Examination-Participation Restrictions Community Activity;Shop;Cleaning;Meal Prep    Stability/Clinical Decision Making Evolving/Moderate complexity    Clinical Decision Making Moderate    Rehab Potential Good    PT Frequency 2x / week    PT Duration 4 weeks   plus eval   PT Treatment/Interventions ADLs/Self Care Home Management;Gait training;Functional mobility training;Therapeutic activities;Therapeutic exercise;Balance training;Neuromuscular re-education;DME Instruction;Patient/family education    PT Next  Visit Plan Initiate HEP for counter balance exercises, work on gait and dynamic balance    Consulted and Agree with Plan of Care Patient;Family member/caregiver    Family Member Consulted granddaughter           Patient will benefit from skilled therapeutic intervention in order to improve the following deficits and impairments:  Abnormal gait,Difficulty walking,Decreased balance,Decreased mobility,Decreased strength  Visit Diagnosis: Other abnormalities of gait and mobility  Unsteadiness on feet  Muscle weakness (generalized)     Problem List Patient Active Problem List   Diagnosis Date Noted  . Stroke ([place]) 10/06/2020  . Lipoma 08/26/2020  . Asthma 08/26/2020  . Bilateral leg pain 08/25/2020  . Encounter for well adult exam with abnormal findings 05/06/2020  . Allergic rhinitis 05/06/2020  . Statin myopathy 05/02/2020  . Left shoulder pain 11/01/2019  . Chronic low back pain 05/03/2019  . B12 deficiency 05/03/2019  . Left leg swelling 11/16/2018   . Left knee pain 11/16/2018  . Low back pain 05/14/2018  . Abnormal SPEP 11/18/2017  . Sternoclavicular joint pain, left 08/19/2017  . Cervical radiculopathy at C8 05/15/2017  . Pre-ulcerative corn or callous 03/27/2017  . Vitamin D deficiency 03/27/2017  . Elevated total protein 03/27/2017  . Bilateral shoulder pain 03/27/2017  . Medicare annual wellness visit, subsequent 03/06/2016  . Spondylolisthesis at L4-L5 level 10/06/2015  . Acute frontal sinusitis 07/14/2015  . Sleep disturbance 07/14/2015  . Knee pain, left 11/10/2014  . Sprain of ankle 04/11/2014  . GERD (gastroesophageal reflux disease) 03/07/2014  . Prolapse of vaginal wall with midline cystocele, grade 1 08/23/2013  . Vaginal dryness 05/14/2012  . Dry mouth 05/13/2012  . Depression and insomnia 07/19/2010  . Multilevel degenerative disc disease of cervical and lumbar spine  01/16/2008  . Essential hypertension 01/01/2008  . Diabetes ([place]) 03/18/2007  . Hyperlipidemia 02/13/2007  . SLEEP APNEA 02/13/2007    Yuriel Lopezmartinez W. 10/18/2020, 5:30 PM  Frazier Butt., PT   [place] [address] [place], [place], [address] Phone: [phone]   Fax:  [phone]  Name: Ferol Laiche MRN: [id number] Date of Birth: 12-19-1950

## 2020-10-24 ENCOUNTER — Other Ambulatory Visit: Payer: Self-pay

## 2020-10-24 ENCOUNTER — Ambulatory Visit: Payer: Medicare Other | Admitting: Physical Therapy

## 2020-10-24 ENCOUNTER — Encounter: Payer: Self-pay | Admitting: Physical Therapy

## 2020-10-24 ENCOUNTER — Encounter: Payer: Self-pay | Admitting: Occupational Therapy

## 2020-10-24 ENCOUNTER — Ambulatory Visit: Payer: Medicare Other | Admitting: Occupational Therapy

## 2020-10-24 DIAGNOSIS — M6281 Muscle weakness (generalized): Secondary | ICD-10-CM

## 2020-10-24 DIAGNOSIS — R2689 Other abnormalities of gait and mobility: Secondary | ICD-10-CM | POA: Diagnosis not present

## 2020-10-24 DIAGNOSIS — R2681 Unsteadiness on feet: Secondary | ICD-10-CM | POA: Diagnosis not present

## 2020-10-24 NOTE — Therapy (Signed)
[place] [address] [place], [place], [address] Phone: [phone]   Fax:  [phone]  Occupational Therapy Evaluation/One time visit  Patient Details  Name: Tiffany [name] MRN: [id number] Date of Birth: 1951-05-19 Referring Provider (OT): Rosalin Hawking, MD   Encounter Date: 10/24/2020   OT End of Session - 10/24/20 1302    Visit Number 1    Number of Visits 1    Authorization Type BCBS Medicare    Authorization Time Period $40 copay per day    OT Start Time 1232    OT Stop Time 1302    OT Time Calculation (min) 30 min    Activity Tolerance Patient tolerated treatment well    Behavior During Therapy [facility] for tasks assessed/performed           Past Medical History:  Diagnosis Date  . Degenerative joint disease   . Diabetes mellitus   . Headache   . Hyperlipidemia   . Hypertension   . Sleep apnea    stopped using it    Past Surgical History:  Procedure Laterality Date  . ABDOMINAL HYSTERECTOMY  1976  . BACK SURGERY    . KNEE ARTHROSCOPY     left x 2  . LUMBAR LAMINECTOMY  1989    There were no vitals filed for this visit.   Subjective Assessment - 10/24/20 1303    Subjective  Pt presents to [facility] s/p CVA on 10/06/2020. Pt reports things have gotten a lot better since her CVA. Pt reports some difficulty with hands but it has gotten a lot better.    Patient is accompanied by: Family member   [name], granddaughter   Pertinent History OSA (without CPAP), HTN, HLD, DM2    Limitations no driving yet, fall risk    Patient Stated Goals better use of hands    Currently in Pain? Yes    Pain Score 7     Pain Location Ear    Pain Orientation Left    Pain Descriptors / Indicators Aching    Pain Type Acute pain    Pain Onset 1 to 4 weeks ago    Pain Frequency Intermittent    Aggravating Factors  pressure             [facility] OT Assessment - 10/24/20 1240      Assessment   Medical Diagnosis Ischemic  Stroke    Referring Provider (OT) Rosalin Hawking, MD    Onset Date/Surgical Date 10/08/20    Hand Dominance Right    Prior Therapy acute      Precautions   Precautions Fall;Other (comment)    Precaution Comments recorder      Balance Screen   Has the patient fallen in the past 6 months No      Home  Environment   Family/patient expects to be discharged to: Private residence    Living Arrangements Alone    Type of [place] One level    Bathroom Scientist, product/process development Yes      Prior Function   Level of Independence Independent    Vocation On disability    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    Leisure watch tv, Automotive engineer, read      ADL   Eating/Feeding Modified independent    Grooming Modified independent    Upper Body Bathing Modified independent  Lower Body Bathing Modified independent    Upper Body Dressing Independent    Lower Body Dressing Modified independent    Toilet Transfer Modified independent    Toileting - Clothing Manipulation Modified independent    Toileting -  Hygiene Modified Independent    Tub/Shower Transfer Modified independent      IADL   Prior Level of Function Shopping independent    Shopping Assistance for transportation    Prior Level of Function Light Housekeeping independent    Light Housekeeping Maintains house alone or with occasional assistance    Prior Level of Function Meal Prep independent    Meal Prep Plans, prepares and serves adequate meals independently;Prepares adequate meal if supplied with ingredients    Prior Level of Function Community Mobility driving    [place] on family or friends for transportation    Prior Level of Function Medication Managment independent    Medication Management Is responsible for taking medication in correct dosages at correct time    Prior Level of Function Network engineer financial matters independently (budgets, writes checks, pays rent, bills goes to bank), collects and keeps track of income      Written Expression   Dominant Hand Right    Handwriting 90% legible      Vision - History   Baseline Vision Wears glasses only for reading      Vision Assessment   Comment pt reports some blurriness and "straining"      Cognition   Behaviors Other (comment)   pt and granddaughter report some difficulty word finding     Observation/Other Assessments   Focus on Therapeutic Outcomes (FOTO)  78% UE FOTO      Sensation   Light Touch Appears Intact    Hot/Cold Impaired Detail    Hot/Cold Impaired Details Impaired RUE   reports feels a little different   Proprioception Appears Intact      Coordination   9 Hole Peg Test Right;Left    Right 9 Hole Peg Test 28.5    Left 9 Hole Peg Test 26.59      ROM / Strength   AROM / PROM / Strength AROM;Strength      AROM   Overall AROM  Within functional limits for tasks performed      Strength   Overall Strength Within functional limits for tasks performed      Hand Function   Right Hand Gross Grasp Functional    Right Hand Grip (lbs) 53.3    Left Hand Gross Grasp Functional    Left Hand Grip (lbs) 56.4                                       Plan - 10/24/20 1303    Clinical Impression Statement Pt is a 70 year old that presents to [facility] s/p CVA on 10/06/2020. Pt has PMH significant for OSA (without use of CPAP), HTN, HLD and DM2. Pt presents with minimal residual deficits in fine motor coordination and strength in RUE but completing all functional tasks with modified independence. Skilled occupational therapy is not recommended at [facility] time.    OT Occupational Profile and History Problem Focused Assessment - Including review of records relating to presenting problem    Body Structure / Function / Physical Skills Strength;Pain    Clinical Decision  Making Limited treatment options,  no task modification necessary    Comorbidities Affecting Occupational Performance: None    Modification or Assistance to Complete Evaluation  No modification of tasks or assist necessary to complete eval    OT Frequency One time visit    Plan one time visit    Consulted and Agree with Plan of Care Patient;Family member/caregiver    Family Member Consulted granddaughter, [place]           Patient will benefit from skilled therapeutic intervention in order to improve the following deficits and impairments:   Body Structure / Function / Physical Skills: Strength,Pain       Visit Diagnosis: Muscle weakness (generalized)    Problem List Patient Active Problem List   Diagnosis Date Noted  . Stroke ([place]) 10/06/2020  . Lipoma 08/26/2020  . Asthma 08/26/2020  . Bilateral leg pain 08/25/2020  . Encounter for well adult exam with abnormal findings 05/06/2020  . Allergic rhinitis 05/06/2020  . Statin myopathy 05/02/2020  . Left shoulder pain 11/01/2019  . Chronic low back pain 05/03/2019  . B12 deficiency 05/03/2019  . Left leg swelling 11/16/2018  . Left knee pain 11/16/2018  . Low back pain 05/14/2018  . Abnormal SPEP 11/18/2017  . Sternoclavicular joint pain, left 08/19/2017  . Cervical radiculopathy at C8 05/15/2017  . Pre-ulcerative corn or callous 03/27/2017  . Vitamin D deficiency 03/27/2017  . Elevated total protein 03/27/2017  . Bilateral shoulder pain 03/27/2017  . Medicare annual wellness visit, subsequent 03/06/2016  . Spondylolisthesis at L4-L5 level 10/06/2015  . Acute frontal sinusitis 07/14/2015  . Sleep disturbance 07/14/2015  . Knee pain, left 11/10/2014  . Sprain of ankle 04/11/2014  . GERD (gastroesophageal reflux disease) 03/07/2014  . Prolapse of vaginal wall with midline cystocele, grade 1 08/23/2013  . Vaginal dryness 05/14/2012  . Dry mouth 05/13/2012  . Depression and insomnia 07/19/2010  . Multilevel  degenerative disc disease of cervical and lumbar spine  01/16/2008  . Essential hypertension 01/01/2008  . Diabetes ([place]) 03/18/2007  . Hyperlipidemia 02/13/2007  . SLEEP APNEA 02/13/2007    Zachery Conch MOT, OTR/L  10/24/2020, 2:05 PM  [facility] [address] [place], [place], [address] Phone: [phone]   Fax:  [phone]  Name: Tiffany [name] MRN: [id number] Date of Birth: 01-13-1951

## 2020-10-24 NOTE — Patient Instructions (Signed)
Access Code: [email or web address] URL: [email or web address] Date: 10/24/2020 Prepared by: Willow Ora  Exercises Walking March - 1 x daily - 5 x weekly - 1 sets - 3 reps Tandem Walking with Counter Support - 1 x daily - 5 x weekly - 1 sets - 3 reps Heel Walking with Counter Support - 1 x daily - 5 x weekly - 1 sets - 3 reps Standing Near Stance in Corner with Eyes Closed - 1 x daily - 5 x weekly - 1 sets - 3 reps - 30 hold Standing Balance in Corner with Eyes Closed - 1 x daily - 5 x weekly - 1 sets - 10 reps

## 2020-10-24 NOTE — Therapy (Signed)
[facility] [address] [place], [place], [address] Phone: [phone]   Fax:  [phone]  Physical Therapy Treatment  Patient Details  Name: Tiffany [name] MRN: [id number] Date of Birth: 10-06-1950 Referring Provider (PT): Rosalin Hawking, MD   Encounter Date: 10/24/2020   PT End of Session - 10/24/20 1323    Visit Number 2    Number of Visits 9    Date for PT Re-Evaluation 12/02/20   4-wk POC, 41 day cert for scheduling   Authorization Type BCBS Medicare    PT Start Time 1320    PT Stop Time 1400    PT Time Calculation (min) 40 min    Equipment Utilized During Treatment Gait belt    Activity Tolerance Patient tolerated treatment well    Behavior During Therapy [facility] for tasks assessed/performed           Past Medical History:  Diagnosis Date  . Degenerative joint disease   . Diabetes mellitus   . Headache   . Hyperlipidemia   . Hypertension   . Sleep apnea    stopped using it    Past Surgical History:  Procedure Laterality Date  . ABDOMINAL HYSTERECTOMY  1976  . BACK SURGERY    . KNEE ARTHROSCOPY     left x 2  . LUMBAR LAMINECTOMY  1989    There were no vitals filed for this visit.   Subjective Assessment - 10/24/20 1322    Subjective No now complaints. No falls or pain to report. Does not have a formal HEP that she is doing at this time.    Patient is accompained by: Family member   granddaughter, [name]   Pertinent History PMH includes HTN, OSA, IDDM II, and HLD.    Patient Stated Goals Pt's goals for therapy are to get R hand and R side stronger.    Currently in Pain? No/denies    Pain Score 0-No pain                [facility] Adult PT Treatment/Exercise - 10/24/20 1331      Transfers   Transfers Sit to Stand;Stand to Sit    Sit to Stand 5: Supervision;Without upper extremity assist;From chair/3-in-1    Stand to Sit 5: Supervision;Without upper extremity assist;To chair/3-in-1       Ambulation/Gait   Ambulation/Gait Yes    Ambulation/Gait Assistance 5: Supervision    Ambulation/Gait Assistance Details around clinic with session    Assistive device None    Gait Pattern Step-through pattern;Decreased arm swing - right;Decreased step length - right;Narrow base of support    Ambulation Surface Level;Indoor      Neuro Re-ed    Neuro Re-ed Details  issued HEP for balance. Refer to [place] for full details. Min guard assist for safety, cues on ex form, technique and posture with ex's.          Issued to HEP this session: Access Code: [email or web address] URL: [email or web address] Date: 10/24/2020 Prepared by: Willow Ora  Exercises Walking March - 1 x daily - 5 x weekly - 1 sets - 3 reps Tandem Walking with Counter Support - 1 x daily - 5 x weekly - 1 sets - 3 reps Heel Walking with Counter Support - 1 x daily - 5 x weekly - 1 sets - 3 reps Standing Near Stance in Corner with Eyes Closed - 1 x daily - 5 x weekly - 1 sets - 3 reps - 30 hold Standing Balance  in [place] with Eyes Closed - 1 x daily - 5 x weekly - 1 sets - 10 reps        PT Education - 10/24/20 1533    Education Details initial HEP for balance    Person(s) Educated Patient;Other (comment)   granddaughter   Methods Explanation;Demonstration;Verbal cues;Handout    Comprehension Verbalized understanding;Returned demonstration;Verbal cues required;Need further instruction               PT Long Term Goals - 10/18/20 1725      PT LONG TERM GOAL #1   Title Pt will be independent with HEP for improved strength, balance, transfers, and gait.  TARGET 11/17/2020    Time 4    Period Weeks    Status New      PT LONG TERM GOAL #2   Title Pt will improve FGA score to at least 21/30 to decrease fall risk.    Baseline 17/30    Time 4    Period Weeks    Status New      PT LONG TERM GOAL #3   Title Pt will improve TUG score to less than or equal to 13.5 sec for decreased fall risk.    Baseline  14.37 sec    Time 4    Period Weeks    Status New      PT LONG TERM GOAL #4   Title Pt will improve gait velocity to at least 3 ft/sec for improved gait efficiency in community.    Baseline 2.7 ft/sec    Time 4    Period Weeks    Status New                 Plan - 10/24/20 1323    Clinical Impression Statement Today's skilled session focused on establishement of an HEP to address balance with no issues noted or reported in session. The pt is progressing toward goals and should benefit from continued PT to progress toward unmet goals.    Personal Factors and Comorbidities Comorbidity 3+    Comorbidities PMH includes HTN, OSA, IDDM II, and HLD; pt reports PMH of L knee pain and back pain limiting stairs as well as reported RLE shorter than LLE    Examination-Activity Limitations Locomotion Level;Transfers;Stand    Examination-Participation Restrictions Community Activity;Shop;Cleaning;Meal Prep    Stability/Clinical Decision Making Evolving/Moderate complexity    Rehab Potential Good    PT Frequency 2x / week    PT Duration 4 weeks   plus eval   PT Treatment/Interventions ADLs/Self Care Home Management;Gait training;Functional mobility training;Therapeutic activities;Therapeutic exercise;Balance training;Neuromuscular re-education;DME Instruction;Patient/family education    PT Next Visit Plan balance exercises, work on gait and dynamic balance    PT Home Exercise Plan Access Code: [email or web address]    Consulted and Agree with Plan of Care Patient;Family member/caregiver    Family Member Consulted granddaughter           Patient will benefit from skilled therapeutic intervention in order to improve the following deficits and impairments:  Abnormal gait,Difficulty walking,Decreased balance,Decreased mobility,Decreased strength  Visit Diagnosis: Other abnormalities of gait and mobility  Unsteadiness on feet  Muscle weakness (generalized)     Problem List Patient Active Problem  List   Diagnosis Date Noted  . Stroke ([place]) 10/06/2020  . Lipoma 08/26/2020  . Asthma 08/26/2020  . Bilateral leg pain 08/25/2020  . Encounter for well adult exam with abnormal findings 05/06/2020  . Allergic rhinitis 05/06/2020  . Statin myopathy 05/02/2020  .  Left shoulder pain 11/01/2019  . Chronic low back pain 05/03/2019  . B12 deficiency 05/03/2019  . Left leg swelling 11/16/2018  . Left knee pain 11/16/2018  . Low back pain 05/14/2018  . Abnormal SPEP 11/18/2017  . Sternoclavicular joint pain, left 08/19/2017  . Cervical radiculopathy at C8 05/15/2017  . Pre-ulcerative corn or callous 03/27/2017  . Vitamin D deficiency 03/27/2017  . Elevated total protein 03/27/2017  . Bilateral shoulder pain 03/27/2017  . Medicare annual wellness visit, subsequent 03/06/2016  . Spondylolisthesis at L4-L5 level 10/06/2015  . Acute frontal sinusitis 07/14/2015  . Sleep disturbance 07/14/2015  . Knee pain, left 11/10/2014  . Sprain of ankle 04/11/2014  . GERD (gastroesophageal reflux disease) 03/07/2014  . Prolapse of vaginal wall with midline cystocele, grade 1 08/23/2013  . Vaginal dryness 05/14/2012  . Dry mouth 05/13/2012  . Depression and insomnia 07/19/2010  . Multilevel degenerative disc disease of cervical and lumbar spine  01/16/2008  . Essential hypertension 01/01/2008  . Diabetes ([place]) 03/18/2007  . Hyperlipidemia 02/13/2007  . SLEEP APNEA 02/13/2007    Willow Ora, PTA, [facility] [address], [place], [place] [address] [phone] 10/24/20, 3:34 PM   Name: Tiffany [name] MRN: [id number] Date of Birth: September 02, 1950

## 2020-10-26 ENCOUNTER — Other Ambulatory Visit: Payer: Self-pay

## 2020-10-26 ENCOUNTER — Ambulatory Visit: Payer: Medicare Other | Attending: Neurology

## 2020-10-26 DIAGNOSIS — M6281 Muscle weakness (generalized): Secondary | ICD-10-CM | POA: Diagnosis not present

## 2020-10-26 DIAGNOSIS — R2681 Unsteadiness on feet: Secondary | ICD-10-CM | POA: Diagnosis not present

## 2020-10-26 DIAGNOSIS — R2689 Other abnormalities of gait and mobility: Secondary | ICD-10-CM

## 2020-10-26 NOTE — Therapy (Signed)
[facility] [address] [place], [place], [address] Phone: [phone]   Fax:  [phone]  Physical Therapy Treatment  Patient Details  Name: Tiffany [name] MRN: [id number] Date of Birth: 01-27-1951 Referring Provider (PT): Rosalin Hawking, MD   Encounter Date: 10/26/2020    Past Medical History:  Diagnosis Date  . Degenerative joint disease   . Diabetes mellitus   . Headache   . Hyperlipidemia   . Hypertension   . Sleep apnea    stopped using it    Past Surgical History:  Procedure Laterality Date  . ABDOMINAL HYSTERECTOMY  1976  . BACK SURGERY    . KNEE ARTHROSCOPY     left x 2  . LUMBAR LAMINECTOMY  1989    There were no vitals filed for this visit.   Subjective Assessment - 10/26/20 1537    Subjective No changes to note, reports HEP tires her out    Patient is accompained by: Family member   granddaughter, Tiffany [name]   Pertinent History PMH includes HTN, OSA, IDDM II, and HLD.    Patient Stated Goals Pt's goals for therapy are to get R hand and R side stronger.    Pain Onset 1 to 4 weeks ago                             [facility] Adult PT Treatment/Exercise - 10/26/20 0001      Transfers   Transfers Sit to Stand    Sit to Stand 5: Supervision      Ambulation/Gait   Ambulation/Gait Yes    Ambulation/Gait Assistance 4: Min guard    Ambulation/Gait Assistance Details trial of cane in ea. hand    Ambulation Distance (Feet) 230 Feet    Assistive device Straight cane    Gait Pattern Step-through pattern    Ambulation Surface Level;Indoor    Gait Comments 174ft with cane in ea. hand respectively      Lumbar Exercises: Supine   Bridge Non-compliant;10 reps;Limitations    Bridge Limitations 2x10      Knee/Hip Exercises: Seated   Long Arc Quad Strengthening;Both;2 sets;10 reps    Long Arc Quad Limitations alternating    Marching Strengthening;Both;2 sets;10 reps    Marching  Limitations alternating      Knee/Hip Exercises: Supine   Other Supine Knee/Hip Exercises supine marching lat, 2x10      Knee/Hip Exercises: Sidelying   Clams 2x10 B               Balance Exercises - 10/26/20 0001      Balance Exercises: Standing   Tandem Gait Forward;Limitations    Tandem Gait Limitations 6 trips at counter    Retro Gait Upper extremity support;Limitations    Retro Gait Limitations 6 trips at counter    Sidestepping Upper extremity support;3 reps;Limitations    Sidestepping Limitations 3 trips at counter                  PT Long Term Goals - 10/18/20 1725      PT LONG TERM GOAL #1   Title Pt will be independent with HEP for improved strength, balance, transfers, and gait.  TARGET 11/17/2020    Time 4    Period Weeks    Status New      PT LONG TERM GOAL #2   Title Pt will improve FGA score to at least 21/30 to decrease fall risk.  Baseline 17/30    Time 4    Period Weeks    Status New      PT LONG TERM GOAL #3   Title Pt will improve TUG score to less than or equal to 13.5 sec for decreased fall risk.    Baseline 14.37 sec    Time 4    Period Weeks    Status New      PT LONG TERM GOAL #4   Title Pt will improve gait velocity to at least 3 ft/sec for improved gait efficiency in community.    Baseline 2.7 ft/sec    Time 4    Period Weeks    Status New                 Plan - 10/26/20 1608    Clinical Impression Statement Todays session included gait training with cane, supine and seated exercises for hip and knee strengthening, advanced balance activities at counter.  One mild episode of lateral instability de to fatigue but patient able to self correct    Personal Factors and Comorbidities Comorbidity 3+    Comorbidities PMH includes HTN, OSA, IDDM II, and HLD; pt reports PMH of L knee pain and back pain limiting stairs as well as reported RLE shorter than LLE    Examination-Activity Limitations Locomotion  Level;Transfers;Stand    Examination-Participation Restrictions Community Activity;Shop;Cleaning;Meal Prep    Stability/Clinical Decision Making Evolving/Moderate complexity    Rehab Potential Good    PT Frequency 2x / week    PT Duration 4 weeks   plus eval   PT Treatment/Interventions ADLs/Self Care Home Management;Gait training;Functional mobility training;Therapeutic activities;Therapeutic exercise;Balance training;Neuromuscular re-education;DME Instruction;Patient/family education    PT Next Visit Plan Continue balance exercises, work on gait and dynamic balance, improve B hip strength and stability    PT Home Exercise Plan Access Code: [email or web address]    Consulted and Agree with Plan of Care Patient;Family member/caregiver    Family Member Consulted granddaughter           Patient will benefit from skilled therapeutic intervention in order to improve the following deficits and impairments:  Abnormal gait,Difficulty walking,Decreased balance,Decreased mobility,Decreased strength  Visit Diagnosis: Muscle weakness (generalized)  Other abnormalities of gait and mobility  Unsteadiness on feet     Problem List Patient Active Problem List   Diagnosis Date Noted  . Stroke ([place]) 10/06/2020  . Lipoma 08/26/2020  . Asthma 08/26/2020  . Bilateral leg pain 08/25/2020  . Encounter for well adult exam with abnormal findings 05/06/2020  . Allergic rhinitis 05/06/2020  . Statin myopathy 05/02/2020  . Left shoulder pain 11/01/2019  . Chronic low back pain 05/03/2019  . B12 deficiency 05/03/2019  . Left leg swelling 11/16/2018  . Left knee pain 11/16/2018  . Low back pain 05/14/2018  . Abnormal SPEP 11/18/2017  . Sternoclavicular joint pain, left 08/19/2017  . Cervical radiculopathy at C8 05/15/2017  . Pre-ulcerative corn or callous 03/27/2017  . Vitamin D deficiency 03/27/2017  . Elevated total protein 03/27/2017  . Bilateral shoulder pain 03/27/2017  . Medicare annual wellness visit,  subsequent 03/06/2016  . Spondylolisthesis at L4-L5 level 10/06/2015  . Acute frontal sinusitis 07/14/2015  . Sleep disturbance 07/14/2015  . Knee pain, left 11/10/2014  . Sprain of ankle 04/11/2014  . GERD (gastroesophageal reflux disease) 03/07/2014  . Prolapse of vaginal wall with midline cystocele, grade 1 08/23/2013  . Vaginal dryness 05/14/2012  . Dry mouth 05/13/2012  . Depression and insomnia 07/19/2010  .  Multilevel degenerative disc disease of cervical and lumbar spine  01/16/2008  . Essential hypertension 01/01/2008  . Diabetes ([place]) 03/18/2007  . Hyperlipidemia 02/13/2007  . SLEEP APNEA 02/13/2007    Lanice Shirts PT 10/26/2020, 4:18 PM  [facility] [address] [place], [place], [address] Phone: [phone]   Fax:  [phone]  Name: Tiffany [name] MRN: [id number] Date of Birth: 1951-04-25

## 2020-10-30 ENCOUNTER — Other Ambulatory Visit: Payer: Self-pay

## 2020-10-30 ENCOUNTER — Ambulatory Visit: Payer: Medicare Other

## 2020-10-30 DIAGNOSIS — M6281 Muscle weakness (generalized): Secondary | ICD-10-CM

## 2020-10-30 DIAGNOSIS — R2681 Unsteadiness on feet: Secondary | ICD-10-CM

## 2020-10-30 DIAGNOSIS — R2689 Other abnormalities of gait and mobility: Secondary | ICD-10-CM

## 2020-10-30 NOTE — Therapy (Signed)
[facility] [address] [place], [place], [address] Phone: [phone]   Fax:  [phone]  Physical Therapy Treatment  Patient Details  Name: Tiffany [name] MRN: [id number] Date of Birth: 07/17/1950 Referring Provider (PT): Rosalin Hawking, MD   Encounter Date: 10/30/2020   PT End of Session - 10/30/20 1615    Visit Number 4    Number of Visits 9    Date for PT Re-Evaluation 12/02/20   4-wk POC, 23 day cert for scheduling   Authorization Type BCBS Medicare    PT Start Time 4580    PT Stop Time 1615    PT Time Calculation (min) 45 min    Equipment Utilized During Treatment Gait belt    Activity Tolerance Patient tolerated treatment well    Behavior During Therapy [facility] for tasks assessed/performed           Past Medical History:  Diagnosis Date  . Degenerative joint disease   . Diabetes mellitus   . Headache   . Hyperlipidemia   . Hypertension   . Sleep apnea    stopped using it    Past Surgical History:  Procedure Laterality Date  . ABDOMINAL HYSTERECTOMY  1976  . BACK SURGERY    . KNEE ARTHROSCOPY     left x 2  . LUMBAR LAMINECTOMY  1989    There were no vitals filed for this visit.   Subjective Assessment - 10/30/20 1536    Subjective Reports issues with L knee and R hip pain today    Patient is accompained by: Family member   granddaughter, [name]   Pertinent History PMH includes HTN, OSA, IDDM II, and HLD.    Patient Stated Goals Pt's goals for therapy are to get R hand and R side stronger.    Pain Onset 1 to 4 weeks ago                             [facility] Adult PT Treatment/Exercise - 10/30/20 0001      Transfers   Transfers Sit to Stand    Sit to Stand 5: Supervision      Ambulation/Gait   Ambulation/Gait Yes    Ambulation/Gait Assistance 4: Min guard    Ambulation Distance (Feet) 230 Feet    Assistive device Straight cane    Gait Pattern Step-through pattern     Ambulation Surface Level;Indoor      Lumbar Exercises: Seated   Other Seated Lumbar Exercises core exercises of hip tosses, shoulder tosses, and Vs with 1.1# ball, 10x      Lumbar Exercises: Supine   Clam 10 reps;Limitations    Clam Limitations 2x10    Heel Slides 10 reps    Heel Slides Limitations 2x10 B    Bridge Non-compliant;10 reps    Bridge Limitations 2x10      Knee/Hip Exercises: Supine   Other Supine Knee/Hip Exercises supine marching lat, 2x10          Frequent rest breaks required to accomodate pain and fatigue             PT Long Term Goals - 10/18/20 1725      PT LONG TERM GOAL #1   Title Pt will be independent with HEP for improved strength, balance, transfers, and gait.  TARGET 11/17/2020    Time 4    Period Weeks    Status New      PT  LONG TERM GOAL #2   Title Pt will improve FGA score to at least 21/30 to decrease fall risk.    Baseline 17/30    Time 4    Period Weeks    Status New      PT LONG TERM GOAL #3   Title Pt will improve TUG score to less than or equal to 13.5 sec for decreased fall risk.    Baseline 14.37 sec    Time 4    Period Weeks    Status New      PT LONG TERM GOAL #4   Title Pt will improve gait velocity to at least 3 ft/sec for improved gait efficiency in community.    Baseline 2.7 ft/sec    Time 4    Period Weeks    Status New                 Plan - 10/30/20 1542    Clinical Impression Statement Todays session continued with LE strengthening in non-weight bearing position due to c/o L knee and R hip pain/buckling.  Added new supine execises as tolerated. Continued ambulation with cane 165ft due to elevated discomfort in L knee.  Mobiliy limited by L knee pain and discussed f/u with ortho regarding previously planned knee surgery which was cancelled due to [place].  Todays session hindered by general fatigue.    Personal Factors and Comorbidities Comorbidity 3+    Comorbidities PMH includes HTN, OSA, IDDM II, and  HLD; pt reports PMH of L knee pain and back pain limiting stairs as well as reported RLE shorter than LLE    Examination-Activity Limitations Locomotion Level;Transfers;Stand    Examination-Participation Restrictions Community Activity;Shop;Cleaning;Meal Prep    Stability/Clinical Decision Making Evolving/Moderate complexity    Rehab Potential Good    PT Frequency 2x / week    PT Duration 4 weeks   plus eval   PT Treatment/Interventions ADLs/Self Care Home Management;Gait training;Functional mobility training;Therapeutic activities;Therapeutic exercise;Balance training;Neuromuscular re-education;DME Instruction;Patient/family education    PT Next Visit Plan work on gait and dynamic balance as leg pain allows, improve B hip strength and stability, WB as tolerated    PT Home Exercise Plan Access Code: [email or web address]    Consulted and Agree with Plan of Care Patient;Family member/caregiver    Family Member Consulted granddaughter           Patient will benefit from skilled therapeutic intervention in order to improve the following deficits and impairments:  Abnormal gait,Difficulty walking,Decreased balance,Decreased mobility,Decreased strength  Visit Diagnosis: Muscle weakness (generalized)  Other abnormalities of gait and mobility  Unsteadiness on feet     Problem List Patient Active Problem List   Diagnosis Date Noted  . Stroke ([place]) 10/06/2020  . Lipoma 08/26/2020  . Asthma 08/26/2020  . Bilateral leg pain 08/25/2020  . Encounter for well adult exam with abnormal findings 05/06/2020  . Allergic rhinitis 05/06/2020  . Statin myopathy 05/02/2020  . Left shoulder pain 11/01/2019  . Chronic low back pain 05/03/2019  . B12 deficiency 05/03/2019  . Left leg swelling 11/16/2018  . Left knee pain 11/16/2018  . Low back pain 05/14/2018  . Abnormal SPEP 11/18/2017  . Sternoclavicular joint pain, left 08/19/2017  . Cervical radiculopathy at C8 05/15/2017  . Pre-ulcerative corn or  callous 03/27/2017  . Vitamin D deficiency 03/27/2017  . Elevated total protein 03/27/2017  . Bilateral shoulder pain 03/27/2017  . Medicare annual wellness visit, subsequent 03/06/2016  . Spondylolisthesis at L4-L5 level 10/06/2015  .  Acute frontal sinusitis 07/14/2015  . Sleep disturbance 07/14/2015  . Knee pain, left 11/10/2014  . Sprain of ankle 04/11/2014  . GERD (gastroesophageal reflux disease) 03/07/2014  . Prolapse of vaginal wall with midline cystocele, grade 1 08/23/2013  . Vaginal dryness 05/14/2012  . Dry mouth 05/13/2012  . Depression and insomnia 07/19/2010  . Multilevel degenerative disc disease of cervical and lumbar spine  01/16/2008  . Essential hypertension 01/01/2008  . Diabetes ([place]) 03/18/2007  . Hyperlipidemia 02/13/2007  . SLEEP APNEA 02/13/2007    Lanice Shirts PT 10/30/2020, 4:17 PM  [facility] [address] [place], [place], [address] Phone: [phone]   Fax:  [phone]  Name: Tiffany [name] MRN: [id number] Date of Birth: Oct 15, 1950

## 2020-10-31 ENCOUNTER — Ambulatory Visit (INDEPENDENT_AMBULATORY_CARE_PROVIDER_SITE_OTHER): Payer: Medicare Other | Admitting: Internal Medicine

## 2020-10-31 ENCOUNTER — Encounter: Payer: Self-pay | Admitting: Internal Medicine

## 2020-10-31 VITALS — BP 140/78 | HR 81 | Temp 98.3°F | Ht 62.0 in | Wt 159.0 lb

## 2020-10-31 DIAGNOSIS — G720 Drug-induced myopathy: Secondary | ICD-10-CM

## 2020-10-31 DIAGNOSIS — I10 Essential (primary) hypertension: Secondary | ICD-10-CM

## 2020-10-31 DIAGNOSIS — M25562 Pain in left knee: Secondary | ICD-10-CM

## 2020-10-31 DIAGNOSIS — Z794 Long term (current) use of insulin: Secondary | ICD-10-CM

## 2020-10-31 DIAGNOSIS — G8929 Other chronic pain: Secondary | ICD-10-CM

## 2020-10-31 DIAGNOSIS — E782 Mixed hyperlipidemia: Secondary | ICD-10-CM

## 2020-10-31 DIAGNOSIS — E1165 Type 2 diabetes mellitus with hyperglycemia: Secondary | ICD-10-CM

## 2020-10-31 DIAGNOSIS — Z8673 Personal history of transient ischemic attack (TIA), and cerebral infarction without residual deficits: Secondary | ICD-10-CM | POA: Diagnosis not present

## 2020-10-31 DIAGNOSIS — T466X5A Adverse effect of antihyperlipidemic and antiarteriosclerotic drugs, initial encounter: Secondary | ICD-10-CM

## 2020-10-31 NOTE — Assessment & Plan Note (Addendum)
Chronic persistent now worsening, is Likely meniscal tear vs djd flare- for ortho Dr Wynelle Link referral

## 2020-10-31 NOTE — Assessment & Plan Note (Signed)
Lab Results  Component Value Date   HGBA1C 7.4 (H) 10/07/2020   Stable, pt to continue current medical treatment lantus

## 2020-10-31 NOTE — Progress Notes (Signed)
Patient ID: Tiffany [name], female   DOB: 04-01-1951, 70 y.o.   MRN: [id number]        Chief Complaint: recent stroke, left kne epain, hld, htn, dm       HPI:  Tiffany [name] is a 70 y.o. female here with above, primarily left knee pain mild to mod, now constant, occasionally severe with swelling x 3 mo, worse to walk, better to sit, no giveaways or falls  Has seen Dr Georgina Snell in past, asks for referral to ortho Dr Wynelle Link after plan for this was deferred due to covid pandemic.  Still using cane.   Had recent stroke - no new focal neuro s/s.  Tolerating asa/plavix/statin well  Finishing up cardiac monitor - Has appt Dr Leonides Schanz July 13. S/p OT and finished, now with good right hand strength, still working with PT on gait and right sided weakness overall.  Trying to follow better DM  Low chol diet.  Pt denies chest pain, increased sob or doe, wheezing, orthopnea, PND, increased LE swelling, palpitations, dizziness or syncope.  Pt denies polydipsia, polyuria.  Pt denies fever, wt loss, night sweats, loss of appetite, or other constitutional symptoms  No other new complaints      Wt Readings from Last 3 Encounters:  10/31/20 159 lb (72.1 kg)  10/06/20 160 lb 7.9 oz (72.8 kg)  08/25/20 157 lb (71.2 kg)   BP Readings from Last 3 Encounters:  10/31/20 140/78  10/18/20 (!) 153/95  10/08/20 (!) 177/93         Past Medical History:  Diagnosis Date  . Degenerative joint disease   . Diabetes mellitus   . Headache   . Hyperlipidemia   . Hypertension   . Sleep apnea    stopped using it   Past Surgical History:  Procedure Laterality Date  . ABDOMINAL HYSTERECTOMY  1976  . BACK SURGERY    . KNEE ARTHROSCOPY     left x 2  . [place]    reports that she quit smoking about 30 years ago. She has a 7.50 pack-year smoking history. She has never used smokeless tobacco. She reports that she does not drink alcohol and does not use drugs. family history includes Diabetes in her mother and  sister; Heart disease in her brother and mother; Hypertension in her brother and mother; Kidney disease in her mother; Stroke in her brother; Thyroid disease in her mother. Allergies  Allergen Reactions  . Contrast Media [Iodinated Diagnostic Agents] Anaphylaxis, Nausea And Vomiting, Swelling and Other (See Comments)    Patients throat closes with nausea and vomiting   . Aspirin Nausea And Vomiting and Other (See Comments)    Can take EC aspirin  . Gadolinium Derivatives Nausea And Vomiting  . Metformin And Related Nausea And Vomiting and Other (See Comments)    Severe nausea and vomiting  . Shellfish Allergy Nausea And Vomiting  . Tramadol Nausea And Vomiting and Other (See Comments)    Migraines  . Penicillins Nausea And Vomiting  . Statins Other (See Comments)    Myalgias    Current Outpatient Medications on File Prior to Visit  Medication Sig Dispense Refill  . albuterol (VENTOLIN HFA) 108 (90 Base) MCG/ACT inhaler Inhale 2 puffs into the lungs every 6 (six) hours as needed for wheezing or shortness of breath. 8 g 5  . amLODipine (NORVASC) 5 MG tablet TAKE 1 TABLET BY MOUTH EVERY DAY (Patient taking differently: Take 5 mg by mouth daily.) 90  tablet 3  . aspirin EC 325 MG EC tablet Take 1 tablet (325 mg total) by mouth daily. 30 tablet 0  . Blood Glucose Monitoring Suppl (ONE TOUCH ULTRA 2) w/Device KIT Apply 1 Device topically in the morning and at bedtime. E11.9 1 kit 0  . clopidogrel (PLAVIX) 75 MG tablet Take 1 tablet (75 mg total) by mouth daily. 90 tablet 0  . conjugated estrogens (PREMARIN) vaginal cream Place 1 Applicatorful vaginally daily. 3 days a week    . EPINEPHrine 0.3 mg/0.3 mL IJ SOAJ injection Inject 0.3 mLs (0.3 mg total) into the muscle as needed for anaphylaxis. 2 each 1  . ezetimibe (ZETIA) 10 MG tablet Take 1 tablet (10 mg total) by mouth daily. 90 tablet 3  . famotidine (PEPCID) 20 MG tablet Take 1 tablet (20 mg total) by mouth 2 (two) times daily. 180 tablet 3   . gabapentin (NEURONTIN) 400 MG capsule TAKE 1 CAPSULE BY MOUTH THREE TIMES DAILY (Patient taking differently: Take 400 mg by mouth 2 (two) times daily.) 270 capsule 1  . glucose blood (ONETOUCH ULTRA) test strip USE AS DIRECTED TWICE DAILY 200 each 0  . losartan (COZAAR) 100 MG tablet Take 0.5 tablets (50 mg total) by mouth daily. 90 tablet 3  . pravastatin (PRAVACHOL) 40 MG tablet Take 1 tablet (40 mg total) by mouth daily at 6 PM. 30 tablet 5  . tiZANidine (ZANAFLEX) 4 MG tablet TAKE 1 TABLET BY MOUTH EVERY 8 HOURS AS NEEDED FOR MUSCLE SPASM (Patient taking differently: Take 4 mg by mouth every 8 (eight) hours as needed for muscle spasms.) 90 tablet 5  . triamcinolone (NASACORT) 55 MCG/ACT AERO nasal inhaler Place 2 sprays into the nose daily. (Patient taking differently: Place 2 sprays into the nose daily as needed (for allergies).) 3 each 3  . vitamin B-12 (CYANOCOBALAMIN) 1000 MCG tablet Take 1 tablet by mouth once daily 90 tablet 0  . zolpidem (AMBIEN) 10 MG tablet Take 1 tablet (10 mg total) by mouth at bedtime as needed. (Patient taking differently: Take 10 mg by mouth at bedtime as needed for sleep.) 90 tablet 1  . estradiol (ESTRACE) 0.1 MG/GM vaginal cream Place vaginally. (Patient not taking: Reported on 10/31/2020)    . HYDROcodone-acetaminophen (NORCO/VICODIN) 5-325 MG tablet Take 1 tablet by mouth every 6 (six) hours as needed. (Patient not taking: No sig reported) 15 tablet 0  . Insulin Pen Needle 32G X 4 MM MISC Use to inject  lantus once daily (Patient not taking: Reported on 10/31/2020) 100 each 4  . Lancets MISC Apply 1 Device topically in the morning and at bedtime. Use twice a day. Once in the morning and once at bed time. DX: E11.9.. Soft Click Lancets, OneTouch Delica Plus Lancing Device (Patient not taking: Reported on 10/31/2020) 200 each 11  . LANTUS SOLOSTAR 100 UNIT/ML Solostar Pen INJECT 55 UNITS INTO THE SKIN DAILY. (Patient not taking: Reported on 10/31/2020) 100 mL 2  .  quinapril (ACCUPRIL) 40 MG tablet Take 40 mg by mouth daily. (Patient not taking: No sig reported)     No current facility-administered medications on file prior to visit.        ROS:  All others reviewed and negative.  Objective        PE:  BP 140/78 (BP Location: Left Arm, Patient Position: Sitting, Cuff Size: Normal)   Pulse 81   Temp 98.3 F (36.8 C) (Oral)   Ht _0  (1.575 m)   Wt 159  lb (72.1 kg)   SpO2 96%   BMI 29.08 kg/m                 Constitutional: Pt appears in NAD               HENT: Head: NCAT.                Right Ear: External ear normal.                 Left Ear: External ear normal.                Eyes: . Pupils are equal, round, and reactive to light. Conjunctivae and EOM are normal               Nose: without d/c or deformity               Neck: Neck supple. Gross normal ROM               Cardiovascular: Normal rate and regular rhythm.                 Pulmonary/Chest: Effort normal and breath sounds without rales or wheezing.                Abd:  Soft, NT, ND, + BS, no organomegaly               Neurological: Pt is alert. At baseline orientation, motor with mild rue/rle weakness               Skin: Skin is warm. No rashes, no other new lesions, LE edema - none               Psychiatric: Pt behavior is normal without agitation   Micro: none  Cardiac tracings I have personally interpreted today:  none  Pertinent Radiological findings (summarize): none   Lab Results  Component Value Date   WBC 4.8 10/07/2020   HGB 13.2 10/07/2020   HCT 40.2 10/07/2020   PLT 220 10/07/2020   GLUCOSE 82 10/07/2020   CHOL 236 (H) 10/07/2020   TRIG 37 10/07/2020   HDL 70 10/07/2020   LDLCALC 159 (H) 10/07/2020   ALT 24 10/07/2020   AST 22 10/07/2020   NA 140 10/07/2020   K 3.9 10/07/2020   CL 106 10/07/2020   CREATININE 0.68 10/07/2020   BUN 8 10/07/2020   CO2 26 10/07/2020   TSH 1.875 10/07/2020   INR 1.0 10/06/2020   HGBA1C 7.4 (H) 10/07/2020   MICROALBUR  1.2 04/26/2020   Assessment/Plan:  Tiffany [name] is a 70 y.o. Black or African American [2] female with  has a past medical history of Degenerative joint disease, Diabetes mellitus, Headache, Hyperlipidemia, Hypertension, and Sleep apnea.  Statin myopathy Now on pravachol trial, cont to follow  History of completed stroke Stable, to continue asa, plavix, pravachol  Diabetes ([place]) Lab Results  Component Value Date   HGBA1C 7.4 (H) 10/07/2020   Stable, pt to continue current medical treatment lantus   Hyperlipidemia Lab Results  Component Value Date   LDLCALC 159 (H) 10/07/2020   Stable, pt to continue current statin pravachol, hopefully to tolerate this, f/u lab next visit, consider lipiid clinic if unable   Essential hypertension BP Readings from Last 3 Encounters:  10/31/20 140/78  10/18/20 (!) 153/95  10/08/20 (!) 177/93   Improved,, pt to continue medical treatment losartan  Knee pain, left Chronic persistent now worsening, is  Likely meniscal tear vs djd flare- for ortho Dr Wynelle Link referral  Followup: Return in about 3 months (around 01/31/2021).  Cathlean Cower, MD 10/31/2020 8:50 PM [facility]

## 2020-10-31 NOTE — Assessment & Plan Note (Signed)
Stable, to continue asa, plavix, pravachol

## 2020-10-31 NOTE — Assessment & Plan Note (Signed)
Lab Results  Component Value Date   LDLCALC 159 (H) 10/07/2020   Stable, pt to continue current statin pravachol, hopefully to tolerate this, f/u lab next visit, consider lipiid clinic if unable

## 2020-10-31 NOTE — Assessment & Plan Note (Signed)
BP Readings from Last 3 Encounters:  10/31/20 140/78  10/18/20 (!) 153/95  10/08/20 (!) 177/93   Improved,, pt to continue medical treatment losartan

## 2020-10-31 NOTE — Assessment & Plan Note (Signed)
Now on pravachol trial, cont to follow

## 2020-10-31 NOTE — Patient Instructions (Signed)
Please continue all other medications as before, and refills have been done if requested.  Please have the pharmacy call with any other refills you may need.  Please continue your efforts at being more active, low cholesterol diabetic diet, and weight control.  Please keep your appointments with your specialists as you may have planned - Dr C (heart doctor) July 13  You will be contacted regarding the referral for: orthopedic Dr Wynelle Link  You are given the handicapped parking form application signed  We can hold on further lab testing today

## 2020-11-01 ENCOUNTER — Other Ambulatory Visit: Payer: Self-pay | Admitting: *Deleted

## 2020-11-01 ENCOUNTER — Other Ambulatory Visit: Payer: Self-pay

## 2020-11-01 ENCOUNTER — Encounter: Payer: Self-pay | Admitting: *Deleted

## 2020-11-01 NOTE — Patient Outreach (Signed)
[facility]) Care Management  11/01/2020  Tiffany [name] 03-24-1951 [id number]   [facility] follow up outreach to [facility] referred/complex care patient  Tiffany [name] was referred to [facility] on 10/16/20 for EMMI stroke referral for red alert reason:Scheduled a follow-up appointment? I Don't Know(after a call to her on Saturday 10/14/20 at 1400) and  an EMMI stroke referral on 10/27/20 for 10/26/20 Thursday day #13 outreach referral reason: had follow up appointment? No  Insuranceblue cross and blue shieldmedicare [facility] admission x 1 [facility] [place] [facility]22-5/15/22 for left MCA infarct -high grade stenosis, hypertension, diabetes, obstructive sleep disorder, hyperlipidemia  Patient is able to verify HIPAA ([place]reat and Accountability Act) identifiers Reviewed and addressed the purpose of the follow up call with the patient  Consent: [facility]) RN CM reviewed [facility] [name] with patient. Patient gave verbal consent for [name].  Follow up assessment Tiffany [name] reports she is getting better daily with less right sided weakness  She reports less episodes of dropping items with the right hand She reports completing a lot of errands and home tasks today to include increase walking and laundry She uses a cane She continues to wear her cardiac monitor She continues to attend her outpatient neuro rehab sessions   She reports today with residual symptoms of eye strain, pain that radiates from the behind her left ear, down hr neck to her left shoulder, hip and knee pain She reports the left shoulder and left knee pain was occurring prior to her stroke  She is to be referred to Dr Wynelle Link  She confirms she may have "over did it" in therapy cautioned on over using her muscles  Discussed use of her ordered Neurontin and Zanaflex stretching   Education on left MCA stroke Reviewed left middle cerebral stroke  Discussed that the left side  of the brain controls  frontal, temporal, and parietal lobes and the sensory functions of the legs arms, throat, hands, and face Discuss brain injury/tissue necrosis Discussed general recovery time after a stroke Encourage to monitor and report visual changes   EMMI  Tiffany Weill confirms audio concerns with the documented answers for EMMI She confirms she does have follow up with her primary care provider (PCP) and her outpatient rehab sessions. She reports her son and grand daughter are alternating in transporting her to her appointments.   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 Wears glasses to read only   Obstructive sleep apnea (OSA)/Asthma Reports she no longer has a CPAP Admits to continued snoring She and [facility] RN CM discussed the process of obtaining a new CPAP after a new sleep study is completed after order from her MD She spoke with primary care provider (PCP) on 10/31/20  Flonase and saline used for Asthma  Grass or standing outside if grass is mowed, change in body temperature-too warm/hot are triggers for her asthma exacerbations  Hypertension ([facility]N) Educated on HTN Hypotension  She does not have a BP cuff  Her blood pressure (BP) remains elevated worsening symptoms and reason for BP control education provided BP Readings from Last 3 Encounters:  10/31/20 140/78  10/18/20 (!) 153/95  10/08/20 (!) 177/93    Neurology Appointment Assisted with outreach to Neurology office to discussed a possible change in the November 16 2020 appointment Other available appointments per Burman Nieves are in [facility] July 2022  Tiffany [name]  was placed on the office cancellation list and she will speak with her family  [facility] RN CM discussed and encouraged her to outreach to blue cross and blue shield customer [name] [phone]  to check on her medical transportation benefit  EMMIs  Sent EMMI education on left side  stroke, neck pain, shoulder pain discharge instructions, high blood pressure in adults  Past Medical History:  Diagnosis Date  . Degenerative joint disease   . Diabetes mellitus   . Headache   . Hyperlipidemia   . Hypertension   . Sleep apnea    stopped using it   Current Outpatient Medications on File Prior to Visit  Medication Sig Dispense Refill  . gabapentin (NEURONTIN) 400 MG capsule TAKE 1 CAPSULE BY MOUTH THREE TIMES DAILY (Patient taking differently: Take 400 mg by mouth 2 (two) times daily.) 270 capsule 1  . glucose blood (ONETOUCH ULTRA) test strip USE AS DIRECTED TWICE DAILY 200 each 0  . Insulin Pen Needle 32G X 4 MM MISC Use to inject  lantus once daily (Patient taking differently: Use to inject  lantus once daily) 100 each 4  . Lancets MISC Apply 1 Device topically in the morning and at bedtime. Use twice a day. Once in the morning and once at bed time. DX: E11.9.. Soft Click Lancets, OneTouch Delica Plus Lancing Device 200 each 11  . tiZANidine (ZANAFLEX) 4 MG tablet TAKE 1 TABLET BY MOUTH EVERY 8 HOURS AS NEEDED FOR MUSCLE SPASM (Patient taking differently: Take 4 mg by mouth every 8 (eight) hours as needed for muscle spasms.) 90 tablet 5  . albuterol (VENTOLIN HFA) 108 (90 Base) MCG/ACT inhaler Inhale 2 puffs into the lungs every 6 (six) hours as needed for wheezing or shortness of breath. 8 g 5  . amLODipine (NORVASC) 5 MG tablet TAKE 1 TABLET BY MOUTH EVERY DAY (Patient taking differently: Take 5 mg by mouth daily.) 90 tablet 3  . aspirin EC 325 MG EC tablet Take 1 tablet (325 mg total) by mouth daily. 30 tablet 0  . Blood Glucose Monitoring Suppl (ONE TOUCH ULTRA 2) w/Device KIT Apply 1 Device topically in the morning and at bedtime. E11.9 1 kit 0  . clopidogrel (PLAVIX) 75 MG tablet Take 1 tablet (75 mg total) by mouth daily. 90 tablet 0  . conjugated estrogens (PREMARIN) vaginal cream Place 1 Applicatorful vaginally daily. 3 days a week    . EPINEPHrine 0.3 mg/0.3 mL IJ  SOAJ injection Inject 0.3 mLs (0.3 mg total) into the muscle as needed for anaphylaxis. 2 each 1  . estradiol (ESTRACE) 0.1 MG/GM vaginal cream Place vaginally. (Patient not taking: Reported on 10/31/2020)    . ezetimibe (ZETIA) 10 MG tablet Take 1 tablet (10 mg total) by mouth daily. 90 tablet 3  . famotidine (PEPCID) 20 MG tablet Take 1 tablet (20 mg total) by mouth 2 (two) times daily. 180 tablet 3  . HYDROcodone-acetaminophen (NORCO/VICODIN) 5-325 MG tablet Take 1 tablet by mouth every 6 (six) hours as needed. (Patient not taking: No sig reported) 15 tablet 0  . LANTUS SOLOSTAR 100 UNIT/ML Solostar Pen INJECT 55 UNITS INTO THE SKIN DAILY. (Patient not taking: Reported on 10/31/2020) 100 mL 2  . losartan (COZAAR) 100 MG tablet Take 0.5 tablets (50 mg total) by mouth daily. 90 tablet 3  . pravastatin (PRAVACHOL) 40 MG tablet Take 1 tablet (40 mg total) by mouth daily at 6 PM. 30 tablet 5  . quinapril (ACCUPRIL) 40 MG tablet  Take 40 mg by mouth daily. (Patient not taking: No sig reported)    . triamcinolone (NASACORT) 55 MCG/ACT AERO nasal inhaler Place 2 sprays into the nose daily. (Patient taking differently: Place 2 sprays into the nose daily as needed (for allergies).) 3 each 3  . vitamin B-12 (CYANOCOBALAMIN) 1000 MCG tablet Take 1 tablet by mouth once daily 90 tablet 0  . zolpidem (AMBIEN) 10 MG tablet Take 1 tablet (10 mg total) by mouth at bedtime as needed. (Patient taking differently: Take 10 mg by mouth at bedtime as needed for sleep.) 90 tablet 1   No current facility-administered medications on file prior to visit.     Plans Patient agrees to care plan and follow up within the next 14-21 business days Pt encouraged to return a call to [place] CM prn Goals Addressed              This Visit's Progress     Patient Stated   .  [facility]) Keep or Improve My Strength-Stroke (pt-stated)   On track     Timeframe:  Long-Range Goal Priority:  High Start Date:         10/16/20                     Expected End Date:     12/22/20                  Follow Up Date 11/08/20  Barriers: Knowledge  - eat healthy to increase strength - increase activity or exercise time a little every week - join an activity or exercise group in community   Notes:  11/01/20  getting better daily with less right sided weakness; less episodes of dropping items with the right hand; reports completing a lot of errands and home tasks today to include increase walking and laundry; uses a cane; continues neuro rehab sessions but report  "over did it" in therapy - left shoulder and knee pain    .  [facility]) Make and Keep All Appointments (pt-stated)   On track     Timeframe:  Short-Term Goal Priority:  High Start Date:                10/16/20             Expected End Date:       11/23/20                Follow Up Date 11/15/2020  Barriers: Knowledge  - ask family or friend for a ride - call to cancel if needed - keep a calendar with appointment dates    Notes:  11/01/20 she did not call to reschedule 11/16/20 appointment agreed to a conference call to neurology office with [facility] RN CM 11/01/20 to inquire about change and placed on office appointment cancellation list She will outreach to her insurance to inquire about transportation benefit        Kamil Hanigan [name], RN, BSN, [place] Coordinator Office number [phone] [facility] number [phone] Fax number [phone]

## 2020-11-02 ENCOUNTER — Telehealth: Payer: Self-pay | Admitting: Internal Medicine

## 2020-11-02 NOTE — Telephone Encounter (Signed)
Patient called and was wondering if Dr. Jenny Reichmann could call her in something for right knee and left hip. Her last OV was 10/31/20. She said that it can be sent to [id number] - [place], [address]. Please advise

## 2020-11-03 ENCOUNTER — Other Ambulatory Visit: Payer: Self-pay

## 2020-11-03 ENCOUNTER — Ambulatory Visit: Payer: Medicare Other

## 2020-11-03 DIAGNOSIS — R2689 Other abnormalities of gait and mobility: Secondary | ICD-10-CM | POA: Diagnosis not present

## 2020-11-03 DIAGNOSIS — R2681 Unsteadiness on feet: Secondary | ICD-10-CM | POA: Diagnosis not present

## 2020-11-03 DIAGNOSIS — M6281 Muscle weakness (generalized): Secondary | ICD-10-CM

## 2020-11-03 MED ORDER — TRAMADOL HCL 50 MG PO TABS: 50.0000 mg | ORAL_TABLET | Freq: Four times a day (QID) | ORAL | 0 refills | Status: DC | PRN

## 2020-11-03 NOTE — Therapy (Signed)
[facility] [address] [place], [place], [address] Phone: [phone]   Fax:  [phone]  Physical Therapy Treatment  Patient Details  Name: Tiffany [name] MRN: [id number] Date of Birth: Apr 29, 1951 Referring Provider (PT): Rosalin Hawking, MD   Encounter Date: 11/03/2020   PT End of Session - 11/03/20 1410     Visit Number 5    Number of Visits 9    Date for PT Re-Evaluation 12/02/20   4-wk POC, 66 day cert for scheduling   Authorization Type BCBS Medicare    PT Start Time 1400    PT Stop Time 1445    PT Time Calculation (min) 45 min    Equipment Utilized During Treatment Gait belt    Activity Tolerance Patient tolerated treatment well    Behavior During Therapy WFL for tasks assessed/performed             Past Medical History:  Diagnosis Date   Degenerative joint disease    Diabetes mellitus    Headache    Hyperlipidemia    Hypertension    Sleep apnea    stopped using it    Past Surgical History:  Procedure Laterality Date   ABDOMINAL HYSTERECTOMY  1976   BACK SURGERY     KNEE ARTHROSCOPY     left x 2   LUMBAR LAMINECTOMY  1989    There were no vitals filed for this visit.   Subjective Assessment - 11/03/20 1408     Subjective Continues with R hip and L knee pain, saw PCP and will be referred back to Dr. Pamala Duffel Adult PT Treatment/Exercise - 11/03/20 0001       Ambulation/Gait   Ambulation/Gait Yes    Ambulation/Gait Assistance 4: Min guard    Ambulation Distance (Feet) 115 Feet    Assistive device Straight cane    Gait Pattern Step-through pattern    Ambulation Surface Level;Indoor    Gait Comments L knee buckling on occaision due to underlying cartilage      Lumbar Exercises: Seated   Other Seated Lumbar Exercises core exercises of hip tosses, shoulder tosses, and Vs with 1.1# ball, 10x      Knee/Hip Exercises: Aerobic    Nustep L1 6" arms 8      Knee/Hip Exercises: Standing   Heel Raises 1 set;10 reps    Heel Raises Limitations standing with foam roll support    Hip ADduction --    Hip ADduction Limitations --    Hip Abduction Stengthening;Both;1 set;10 reps    Abduction Limitations foam roll for support, opposite hip/shoulder abduction      Knee/Hip Exercises: Seated   Long Arc Quad Strengthening;Both;10 reps;Limitations    Long Arc Quad Limitations perfomed with latissimus pressdown in alt pattern    Marching Strengthening;Both;1 set;10 reps;Limitations    Marching Limitations alternating with latissimus pressdown                         PT Long Term Goals - 10/18/20 1725       PT LONG TERM GOAL #1   Title Pt will be independent with HEP for improved strength, balance, transfers, and gait.  TARGET 11/17/2020    Time 4    Period Weeks    Status New  PT LONG TERM GOAL #2   Title Pt will improve FGA score to at least 21/30 to decrease fall risk.    Baseline 17/30    Time 4    Period Weeks    Status New      PT LONG TERM GOAL #3   Title Pt will improve TUG score to less than or equal to 13.5 sec for decreased fall risk.    Baseline 14.37 sec    Time 4    Period Weeks    Status New      PT LONG TERM GOAL #4   Title Pt will improve gait velocity to at least 3 ft/sec for improved gait efficiency in community.    Baseline 2.7 ft/sec    Time 4    Period Weeks    Status New                   Plan - 11/03/20 1414     Clinical Impression Statement Todays session continued to focus on B hip and knee strengthening as tolerated due to suspected underlying cartilage tear in L knee and concurrent R hip pain    Personal Factors and Comorbidities Comorbidity 3+    Comorbidities PMH includes HTN, OSA, IDDM II, and HLD; pt reports PMH of L knee pain and back pain limiting stairs as well as reported RLE shorter than LLE    Examination-Activity Limitations Locomotion  Level;Transfers;Stand    Examination-Participation Restrictions Community Activity;Shop;Cleaning;Meal Prep    Stability/Clinical Decision Making Evolving/Moderate complexity    Rehab Potential Good    PT Frequency 2x / week    PT Duration 4 weeks   plus eval   PT Treatment/Interventions ADLs/Self Care Home Management;Gait training;Functional mobility training;Therapeutic activities;Therapeutic exercise;Balance training;Neuromuscular re-education;DME Instruction;Patient/family education    PT Next Visit Plan continue to work on core sterngthening incorporating hip and knee strengthening as underlying pain and pathologies allowed, encourage ortho f/u to address L knee symptoms    PT Home Exercise Plan Access Code: [email or web address]    Consulted and Agree with Plan of Care Patient;Family member/caregiver    Family Member Consulted granddaughter             Patient will benefit from skilled therapeutic intervention in order to improve the following deficits and impairments:  Abnormal gait, Difficulty walking, Decreased balance, Decreased mobility, Decreased strength  Visit Diagnosis: Muscle weakness (generalized)  Other abnormalities of gait and mobility  Unsteadiness on feet     Problem List Patient Active Problem List   Diagnosis Date Noted   History of completed stroke 10/31/2020   Stroke ([place]) 10/06/2020   Lipoma 08/26/2020   Asthma 08/26/2020   Bilateral leg pain 08/25/2020   Encounter for well adult exam with abnormal findings 05/06/2020   Allergic rhinitis 05/06/2020   Statin myopathy 05/02/2020   Left shoulder pain 11/01/2019   Chronic low back pain 05/03/2019   B12 deficiency 05/03/2019   Left leg swelling 11/16/2018   Left knee pain 11/16/2018   Low back pain 05/14/2018   Abnormal SPEP 11/18/2017   Sternoclavicular joint pain, left 08/19/2017   Cervical radiculopathy at C8 05/15/2017   Pre-ulcerative corn or callous 03/27/2017   Vitamin D deficiency 03/27/2017    Elevated total protein 03/27/2017   Bilateral shoulder pain 03/27/2017   Medicare annual wellness visit, subsequent 03/06/2016   Spondylolisthesis at L4-L5 level 10/06/2015   Acute frontal sinusitis 07/14/2015   Sleep disturbance 07/14/2015   Knee pain, left 11/10/2014   Sprain  of ankle 04/11/2014   GERD (gastroesophageal reflux disease) 03/07/2014   Prolapse of vaginal wall with midline cystocele, grade 1 08/23/2013   Vaginal dryness 05/14/2012   Dry mouth 05/13/2012   Depression and insomnia 07/19/2010   Multilevel degenerative disc disease of cervical and lumbar spine  01/16/2008   Essential hypertension 01/01/2008   Diabetes ([place]) 03/18/2007   Hyperlipidemia 02/13/2007   SLEEP APNEA 02/13/2007    Lanice Shirts PT 11/03/2020, 4:28 PM  [facility] [address] [place], [place], [address] Phone: [phone]   Fax:  [phone]  Name: Keimani Laufer MRN: [id number] Date of Birth: Sep 07, 1950

## 2020-11-03 NOTE — Telephone Encounter (Signed)
Patient notified via voicemail.

## 2020-11-03 NOTE — Telephone Encounter (Signed)
[facility] for tramadol prn - done erx

## 2020-11-05 ENCOUNTER — Telehealth: Payer: Self-pay | Admitting: Cardiology

## 2020-11-05 NOTE — Telephone Encounter (Signed)
Called from [facility] with critical EKG. Cardiac monitor placed in the setting of stroke. Noted 8 beats of NSVT VT this morning at 0831, with return to NSR. Patient was asymptomatic. No change in therapy. Planned scheduled follow up 7/13 with Dr. Sallyanne Kuster.

## 2020-11-06 ENCOUNTER — Other Ambulatory Visit: Payer: Self-pay | Admitting: Internal Medicine

## 2020-11-06 ENCOUNTER — Telehealth: Payer: Self-pay

## 2020-11-06 ENCOUNTER — Other Ambulatory Visit: Payer: Self-pay

## 2020-11-06 ENCOUNTER — Ambulatory Visit: Payer: Medicare Other

## 2020-11-06 DIAGNOSIS — R2689 Other abnormalities of gait and mobility: Secondary | ICD-10-CM | POA: Diagnosis not present

## 2020-11-06 DIAGNOSIS — M6281 Muscle weakness (generalized): Secondary | ICD-10-CM | POA: Diagnosis not present

## 2020-11-06 DIAGNOSIS — R2681 Unsteadiness on feet: Secondary | ICD-10-CM

## 2020-11-06 NOTE — Telephone Encounter (Signed)
Received a critical monitor ekg from [facility] on 11/05/20 at 9:13 am 8 beat run of v-tach.Patient was unaware of episode.DOD Dr.Harding advised no change continue to wear monitor.

## 2020-11-06 NOTE — Therapy (Signed)
[facility] [address] [place], [place], [address] Phone: [phone]   Fax:  [phone]  Physical Therapy Treatment  Patient Details  Name: Tiffany [name] MRN: [id number] Date of Birth: 24-Aug-1950 Referring Provider (PT): Rosalin Hawking, MD   Encounter Date: 11/06/2020   PT End of Session - 11/06/20 1503     Visit Number 6    Number of Visits 9    Date for PT Re-Evaluation 12/02/20   4-wk POC, 9 day cert for scheduling   Authorization Type BCBS Medicare    PT Start Time 9449    PT Stop Time 1530    PT Time Calculation (min) 45 min    Equipment Utilized During Treatment Gait belt    Activity Tolerance Patient tolerated treatment well    Behavior During Therapy WFL for tasks assessed/performed             Past Medical History:  Diagnosis Date   Degenerative joint disease    Diabetes mellitus    Headache    Hyperlipidemia    Hypertension    Sleep apnea    stopped using it    Past Surgical History:  Procedure Laterality Date   ABDOMINAL HYSTERECTOMY  1976   BACK SURGERY     KNEE ARTHROSCOPY     left x 2   LUMBAR LAMINECTOMY  1989    There were no vitals filed for this visit.   Subjective Assessment - 11/06/20 1448     Subjective Reports an increase in R hip/shoulder and L knee pain following last session    Patient is accompained by: Family member   granddaughter, [name]   Pertinent History PMH includes HTN, OSA, IDDM II, and HLD.    Patient Stated Goals Pt's goals for therapy are to get R hand and R side stronger.    Pain Onset 1 to 4 weeks ago                               [facility] Adult PT Treatment/Exercise - 11/06/20 0001       Ambulation/Gait   Ambulation/Gait Yes    Ambulation/Gait Assistance 4: Min guard    Ambulation Distance (Feet) 230 Feet    Assistive device Straight cane    Gait Pattern Step-through pattern    Ambulation Surface Level;Indoor       Lumbar Exercises: Supine   Ab Set 10 reps;Limitations    AB Set Limitations 2x10    Pelvic Tilt 10 reps;Limitations    Pelvic Tilt Limitations pelvic tilt 2x10    Clam 10 reps;Limitations    Clam Limitations 2x10    Bridge Non-compliant;10 reps    Bridge Limitations 2x10    Other Supine Lumbar Exercises marching 2x10, alt.    Other Supine Lumbar Exercises DKTC with red ball      Knee/Hip Exercises: Seated   Long Arc Quad Strengthening;Both;10 reps;Limitations;2 sets    Long Arc Quad Limitations perfomed with latissimus pressdown in alt pattern    Marching Strengthening;Both;10 reps;Limitations;2 sets    Marching Limitations alternating with latissimus pressdown                         PT Long Term Goals - 10/18/20 1725       PT LONG TERM GOAL #1   Title Pt will be independent with HEP for improved strength, balance, transfers, and gait.  TARGET  11/17/2020    Time 4    Period Weeks    Status New      PT LONG TERM GOAL #2   Title Pt will improve FGA score to at least 21/30 to decrease fall risk.    Baseline 17/30    Time 4    Period Weeks    Status New      PT LONG TERM GOAL #3   Title Pt will improve TUG score to less than or equal to 13.5 sec for decreased fall risk.    Baseline 14.37 sec    Time 4    Period Weeks    Status New      PT LONG TERM GOAL #4   Title Pt will improve gait velocity to at least 3 ft/sec for improved gait efficiency in community.    Baseline 2.7 ft/sec    Time 4    Period Weeks    Status New                   Plan - 11/06/20 1504     Clinical Impression Statement Patient seen for core, R hip and L knee strength and stabilizing exercises to improve overall gait balance and mobility following CVA. Patient limited by pre CVA R hip, shoulder and L knee pain.  Continued to encourage ortho f/u as symptoms do not appear stroke/neuro related.  Increased reps and difficulty as noted and limited session to non weightbearing  tasks    Personal Factors and Comorbidities Comorbidity 3+    Comorbidities PMH includes HTN, OSA, IDDM II, and HLD; pt reports PMH of L knee pain and back pain limiting stairs as well as reported RLE shorter than LLE    Examination-Activity Limitations Locomotion Level;Transfers;Stand    Examination-Participation Restrictions Community Activity;Shop;Cleaning;Meal Prep    Stability/Clinical Decision Making Evolving/Moderate complexity    Rehab Potential Good    PT Frequency 2x / week    PT Duration 4 weeks   plus eval   PT Treatment/Interventions ADLs/Self Care Home Management;Gait training;Functional mobility training;Therapeutic activities;Therapeutic exercise;Balance training;Neuromuscular re-education;DME Instruction;Patient/family education    PT Next Visit Plan continue to work on core sterngthening incorporating hip and knee strengthening as underlying pain and pathologies allowed, encourage ortho f/u to address L knee symptoms as well as R side isuues    PT Home Exercise Plan Access Code: [email or web address]    Consulted and Agree with Plan of Care Patient;Family member/caregiver    Family Member Consulted granddaughter             Patient will benefit from skilled therapeutic intervention in order to improve the following deficits and impairments:  Abnormal gait, Difficulty walking, Decreased balance, Decreased mobility, Decreased strength  Visit Diagnosis: Muscle weakness (generalized)  Unsteadiness on feet  Other abnormalities of gait and mobility     Problem List Patient Active Problem List   Diagnosis Date Noted   History of completed stroke 10/31/2020   Stroke ([place]) 10/06/2020   Lipoma 08/26/2020   Asthma 08/26/2020   Bilateral leg pain 08/25/2020   Encounter for well adult exam with abnormal findings 05/06/2020   Allergic rhinitis 05/06/2020   Statin myopathy 05/02/2020   Left shoulder pain 11/01/2019   Chronic low back pain 05/03/2019   B12 deficiency 05/03/2019    Left leg swelling 11/16/2018   Left knee pain 11/16/2018   Low back pain 05/14/2018   Abnormal SPEP 11/18/2017   Sternoclavicular joint pain, left 08/19/2017   Cervical radiculopathy at C8  05/15/2017   Pre-ulcerative corn or callous 03/27/2017   Vitamin D deficiency 03/27/2017   Elevated total protein 03/27/2017   Bilateral shoulder pain 03/27/2017   Medicare annual wellness visit, subsequent 03/06/2016   Spondylolisthesis at L4-L5 level 10/06/2015   Acute frontal sinusitis 07/14/2015   Sleep disturbance 07/14/2015   Knee pain, left 11/10/2014   Sprain of ankle 04/11/2014   GERD (gastroesophageal reflux disease) 03/07/2014   Prolapse of vaginal wall with midline cystocele, grade 1 08/23/2013   Vaginal dryness 05/14/2012   Dry mouth 05/13/2012   Depression and insomnia 07/19/2010   Multilevel degenerative disc disease of cervical and lumbar spine  01/16/2008   Essential hypertension 01/01/2008   Diabetes ([place]) 03/18/2007   Hyperlipidemia 02/13/2007   SLEEP APNEA 02/13/2007    Lanice Shirts PT 11/06/2020, 3:33 PM  [facility] [address] [place], [place], [address] Phone: [phone]   Fax:  [phone]  Name: Tiffany [name] MRN: [id number] Date of Birth: [birth year]

## 2020-11-06 NOTE — Telephone Encounter (Signed)
Please refill as per office routine med refill policy (all routine meds refilled for 3 mo or monthly per pt preference up to one year from last visit, then month to month grace period for 3 mo, then further med refills will have to be denied)  

## 2020-11-10 ENCOUNTER — Ambulatory Visit: Payer: Medicare Other | Admitting: Physical Therapy

## 2020-11-10 ENCOUNTER — Other Ambulatory Visit: Payer: Self-pay

## 2020-11-10 ENCOUNTER — Encounter: Payer: Self-pay | Admitting: Physical Therapy

## 2020-11-10 DIAGNOSIS — R2689 Other abnormalities of gait and mobility: Secondary | ICD-10-CM

## 2020-11-10 DIAGNOSIS — R2681 Unsteadiness on feet: Secondary | ICD-10-CM | POA: Diagnosis not present

## 2020-11-10 DIAGNOSIS — M6281 Muscle weakness (generalized): Secondary | ICD-10-CM | POA: Diagnosis not present

## 2020-11-12 NOTE — Therapy (Signed)
[facility] [address] [place], [place], [address] Phone: [phone]   Fax:  [phone]  Physical Therapy Treatment  Patient Details  Name: Tiffany [name] MRN: [id number] Date of Birth: 05/05/1951 Referring Provider (PT): Rosalin Hawking, MD   Encounter Date: 11/10/2020    11/10/20 1456  PT Visits / Re-Eval  Visit Number 7  Number of Visits 9  Date for PT Re-Evaluation 12/02/20 (4-wk POC, 68 day cert for scheduling)  Authorization  Authorization Type BCBS Medicare  PT Time Calculation  PT Start Time 1449  PT Stop Time 1530  PT Time Calculation (min) 41 min  PT - End of Session  Equipment Utilized During Treatment Gait belt  Activity Tolerance Patient tolerated treatment well  Behavior During Therapy WFL for tasks assessed/performed    Past Medical History:  Diagnosis Date   Degenerative joint disease    Diabetes mellitus    Headache    Hyperlipidemia    Hypertension    Sleep apnea    stopped using it    Past Surgical History:  Procedure Laterality Date   ABDOMINAL HYSTERECTOMY  1976   BACK SURGERY     KNEE ARTHROSCOPY     left x 2   LUMBAR LAMINECTOMY  1989    There were no vitals filed for this visit.    11/10/20 1452  Symptoms/Limitations  Subjective No new complaints.No falls. Her PCP has prescribed a pain medication for her to address her knee/shoulder pain, has not picked it up from pharmacy.  Pertinent History PMH includes HTN, OSA, IDDM II, and HLD.  Patient Stated Goals Pt's goals for therapy are to get R hand and R side stronger.  Pain Assessment  Currently in Pain? Yes  Pain Score 6  Pain Location Shoulder  Pain Orientation Left  Pain Descriptors / Indicators Aching;Sore  Pain Type Chronic pain  Pain Onset More than a month ago  Pain Frequency Constant  Aggravating Factors  certain movements  Pain Relieving Factors resting  Multiple Pain Sites Yes  2nd Pain Site  Pain  Score 8  Pain Location Knee  Pain Orientation Left  Pain Descriptors / Indicators Throbbing  Pain Type Chronic pain  Pain Onset More than a month ago  Pain Frequency Constant  Aggravating Factors  certain movements, increased walking/standing  Pain Relieving Factors propping it up, slight knee flexion with pillow under it       11/10/20 1457  Transfers  Transfers Sit to Stand;Stand to Sit  Sit to Stand 5: Supervision  Stand to Sit 5: Supervision;Without upper extremity assist;To chair/3-in-1  Ambulation/Gait  Ambulation/Gait Yes  Ambulation/Gait Assistance 4: Min guard  Ambulation/Gait Assistance Details around clinic with session  Assistive device Straight cane  Gait Pattern Step-through pattern  Ambulation Surface Level;Indoor  Neuro Re-ed   Neuro Re-ed Details  for balance/NMR; seated on green air disc with feet on 2 inch box on floor for support (feet not fully on ground without the box)- alternating UE raises (in pain free ranges), alternating marching, then alternating contralateral UE/LE raises all for 10 reps each/each side. reminder cues for abd bracing and posture.  Lumbar Exercises: Supine  Pelvic Tilt 1 rep;10 reps;5 seconds;Limitations  Pelvic Tilt Limitations posterior pelvic tile with tactile cues needed on correct form/technique and verbal cues on hold time  Bridge Non-compliant;10 reps;5 seconds;Limitations  Bridge Limitations cues for abdominal bracing with each rep and for hold times.        11/10/20 1511  Balance Exercises: Standing  Standing Eyes Closed Foam/compliant surface;Narrow base of support (BOS);Wide (BOA);Head turns;Other reps (comment);30 secs;Limitations  Standing Eyes Closed Limitations on airex with no UE support: feet together for EC 30 seconds x 3 reps; then with feet hip width apart for EC head movements left<>right, up<>down and diagonal both ways for 10 rep each. min guard to min assist for balance with mild increased postural sway  noted. cues on posture and weight shifting to assist with balance.  Partial Tandem Stance Foam/compliant surface;Eyes closed;2 reps;30 secs;Limitations  Partial Tandem Stance Limitations on airex with no UE support: each foot forward for 2 reps with min guard to min assist for balance. cues on posture/weight shifting to assist with balance.         PT Long Term Goals - 10/18/20 1725       PT LONG TERM GOAL #1   Title Pt will be independent with HEP for improved strength, balance, transfers, and gait.  TARGET 11/17/2020    Time 4    Period Weeks    Status New      PT LONG TERM GOAL #2   Title Pt will improve FGA score to at least 21/30 to decrease fall risk.    Baseline 17/30    Time 4    Period Weeks    Status New      PT LONG TERM GOAL #3   Title Pt will improve TUG score to less than or equal to 13.5 sec for decreased fall risk.    Baseline 14.37 sec    Time 4    Period Weeks    Status New      PT LONG TERM GOAL #4   Title Pt will improve gait velocity to at least 3 ft/sec for improved gait efficiency in community.    Baseline 2.7 ft/sec    Time 4    Period Weeks    Status New               11/10/20 1456  Plan  Clinical Impression Statement Today's skilled session continued to address strengthening and balance on complaint surfaces. No issues reported or noted, no increase in pain reported. The pt is making steady progress toward goals and should benefit from continued PT to progress toward unmet goals.  Personal Factors and Comorbidities Comorbidity 3+  Comorbidities PMH includes HTN, OSA, IDDM II, and HLD; pt reports PMH of L knee pain and back pain limiting stairs as well as reported RLE shorter than LLE  Examination-Activity Limitations Locomotion Level;Transfers;Stand  Examination-Participation Restrictions Community Activity;Shop;Cleaning;Meal Prep  Pt will benefit from skilled therapeutic intervention in order to improve on the following deficits Abnormal  gait;Difficulty walking;Decreased balance;Decreased mobility;Decreased strength  Stability/Clinical Decision Making Evolving/Moderate complexity  Rehab Potential Good  PT Frequency 2x / week  PT Duration 4 weeks (plus eval)  PT Treatment/Interventions ADLs/Self Care Home Management;Gait training;Functional mobility training;Therapeutic activities;Therapeutic exercise;Balance training;Neuromuscular re-education;DME Instruction;Patient/family education  PT Next Visit Plan continue to work on core sterngthening incorporating hip and knee strengthening as underlying pain and pathologies allowed, encourage ortho f/u to address L knee symptoms as well as R side isuues  PT Home Exercise Plan Access Code: [email or web address]  Consulted and Agree with Plan of Care Patient;Family member/caregiver  Family Member Consulted granddaughter      Patient will benefit from skilled therapeutic intervention in order to improve the following deficits and impairments:  Abnormal gait, Difficulty walking, Decreased balance, Decreased mobility, Decreased strength  Visit Diagnosis: Muscle weakness (  generalized)  Unsteadiness on feet  Other abnormalities of gait and mobility     Problem List Patient Active Problem List   Diagnosis Date Noted   History of completed stroke 10/31/2020   Stroke ([place]) 10/06/2020   Lipoma 08/26/2020   Asthma 08/26/2020   Bilateral leg pain 08/25/2020   Encounter for well adult exam with abnormal findings 05/06/2020   Allergic rhinitis 05/06/2020   Statin myopathy 05/02/2020   Left shoulder pain 11/01/2019   Chronic low back pain 05/03/2019   B12 deficiency 05/03/2019   Left leg swelling 11/16/2018   Left knee pain 11/16/2018   Low back pain 05/14/2018   Abnormal SPEP 11/18/2017   Sternoclavicular joint pain, left 08/19/2017   Cervical radiculopathy at C8 05/15/2017   Pre-ulcerative corn or callous 03/27/2017   Vitamin D deficiency 03/27/2017   Elevated total protein 03/27/2017    Bilateral shoulder pain 03/27/2017   Medicare annual wellness visit, subsequent 03/06/2016   Spondylolisthesis at L4-L5 level 10/06/2015   Acute frontal sinusitis 07/14/2015   Sleep disturbance 07/14/2015   Knee pain, left 11/10/2014   Sprain of ankle 04/11/2014   GERD (gastroesophageal reflux disease) 03/07/2014   Prolapse of vaginal wall with midline cystocele, grade 1 08/23/2013   Vaginal dryness 05/14/2012   Dry mouth 05/13/2012   Depression and insomnia 07/19/2010   Multilevel degenerative disc disease of cervical and lumbar spine  01/16/2008   Essential hypertension 01/01/2008   Diabetes ([place]) 03/18/2007   Hyperlipidemia 02/13/2007   SLEEP APNEA 02/13/2007   Willow Ora, PTA, [facility] [address], [place], [place] [address] [phone] 11/12/20, 10:51 PM   Name: Tiffany [name] MRN: [id number] Date of Birth: August 26, 1950

## 2020-11-13 ENCOUNTER — Other Ambulatory Visit: Payer: Self-pay

## 2020-11-13 ENCOUNTER — Ambulatory Visit: Payer: Medicare Other

## 2020-11-13 DIAGNOSIS — R2681 Unsteadiness on feet: Secondary | ICD-10-CM | POA: Diagnosis not present

## 2020-11-13 DIAGNOSIS — M6281 Muscle weakness (generalized): Secondary | ICD-10-CM

## 2020-11-13 DIAGNOSIS — R2689 Other abnormalities of gait and mobility: Secondary | ICD-10-CM

## 2020-11-13 NOTE — Therapy (Signed)
[facility] [address] [place], [place], [address] Phone: [phone]   Fax:  [phone]  Physical Therapy Treatment  Patient Details  Name: Tiffany [name] MRN: [id number] Date of Birth: January 01, 1951 Referring Provider (PT): Rosalin Hawking, MD   Encounter Date: 11/13/2020   PT End of Session - 11/13/20 1456     Visit Number 8    Number of Visits 9    Date for PT Re-Evaluation 12/02/20   4-wk POC, 10 day cert for scheduling   Authorization Type BCBS Medicare    PT Start Time 0923    PT Stop Time 1530    PT Time Calculation (min) 45 min    Equipment Utilized During Treatment Gait belt    Activity Tolerance Patient tolerated treatment well    Behavior During Therapy WFL for tasks assessed/performed             Past Medical History:  Diagnosis Date   Degenerative joint disease    Diabetes mellitus    Headache    Hyperlipidemia    Hypertension    Sleep apnea    stopped using it    Past Surgical History:  Procedure Laterality Date   ABDOMINAL HYSTERECTOMY  1976   BACK SURGERY     KNEE ARTHROSCOPY     left x 2   LUMBAR LAMINECTOMY  1989    There were no vitals filed for this visit.   Subjective Assessment - 11/13/20 1449     Subjective Continues with L hip and R knee pain, still awaiting ortho consult to address L knee pain    Patient is accompained by: Family member   granddaughter, Tiffany [name]   Pertinent History PMH includes HTN, OSA, IDDM II, and HLD.    Patient Stated Goals Pt's goals for therapy are to get R hand and R side stronger.    Pain Onset More than a month ago    Pain Onset More than a month ago                [facility] PT Assessment - 11/13/20 0001       Timed Up and Go Test   Normal TUG (seconds) 11.4    TUG Comments 14.1 with cane, 11.4 w/o cane      Functional Gait  Assessment   Gait assessed  Yes    Gait Level Surface Walks 20 ft, slow speed, abnormal gait pattern, evidence  for imbalance or deviates 10-15 in outside of the 12 in walkway width. Requires more than 7 sec to ambulate 20 ft.    Change in Gait Speed Able to smoothly change walking speed without loss of balance or gait deviation. Deviate no more than 6 in outside of the 12 in walkway width.    Gait with Horizontal Head Turns Performs head turns smoothly with no change in gait. Deviates no more than 6 in outside 12 in walkway width    Gait with Vertical Head Turns Performs head turns with no change in gait. Deviates no more than 6 in outside 12 in walkway width.    Gait and Pivot Turn Pivot turns safely within 3 sec and stops quickly with no loss of balance.    Step Over Obstacle Is able to step over one shoe box (4.5 in total height) without changing gait speed. No evidence of imbalance.    Gait with Narrow Base of Support Is able to ambulate for 10 steps heel to toe with no staggering.  Gait with Eyes Closed Walks 20 ft, uses assistive device, slower speed, mild gait deviations, deviates 6-10 in outside 12 in walkway width. Ambulates 20 ft in less than 9 sec but greater than 7 sec.    Ambulating Backwards Walks 20 ft, uses assistive device, slower speed, mild gait deviations, deviates 6-10 in outside 12 in walkway width.    Steps Two feet to a stair, must use rail.    Total Score 23                           [facility] Adult PT Treatment/Exercise - 11/13/20 0001       Transfers   Transfers Sit to Stand;Stand to Sit    Sit to Stand 5: Supervision      Ambulation/Gait   Ambulation/Gait Yes    Ambulation/Gait Assistance 4: Min guard    Ambulation Distance (Feet) 230 Feet    Assistive device Straight cane    Gait Pattern Step-through pattern    Ambulation Surface Level;Indoor      Knee/Hip Exercises: Aerobic   Other Aerobic Scifit L1 arms 6                 Balance Exercises - 11/13/20 0001       Balance Exercises: Standing   Standing Eyes Closed Narrow base of support  (BOS);Head turns;5 reps;Solid surface                    PT Long Term Goals - 11/13/20 1521       PT LONG TERM GOAL #1   Title Pt will be independent with HEP for improved strength, balance, transfers, and gait.  TARGET 11/17/2020    Baseline 11/13/20 HEP reviewed    Time 4    Period Weeks    Status On-going      PT LONG TERM GOAL #2   Title Pt will improve FGA score to at least 21/30 to decrease fall risk.    Baseline 17/30; 11/13/20 FGA score 23, goal met    Time 4    Period Weeks    Status Achieved      PT LONG TERM GOAL #3   Title Pt will improve TUG score to less than or equal to 13.5 sec for decreased fall risk.    Baseline 14.37 sec; 11/13/20 TUG score w/o AD 11.4s    Time 4    Period Weeks    Status Achieved      PT LONG TERM GOAL #4   Title Pt will improve gait velocity to at least 3 ft/sec for improved gait efficiency in community.    Baseline 2.7 ft/sec    Time 4    Period Weeks    Status New                   Plan - 11/13/20 1501     Clinical Impression Statement Todays session consisted of assessment of goal progress and aerobic and sterngth training, reviewed balance tasks in vision removed environment    Personal Factors and Comorbidities Comorbidity 3+    Comorbidities PMH includes HTN, OSA, IDDM II, and HLD; pt reports PMH of L knee pain and back pain limiting stairs as well as reported RLE shorter than LLE    Examination-Activity Limitations Locomotion Level;Transfers;Stand    Examination-Participation Restrictions Community Activity;Shop;Cleaning;Meal Prep    Stability/Clinical Decision Making Evolving/Moderate complexity    Rehab Potential Good    PT  Frequency 2x / week    PT Duration 4 weeks   plus eval   PT Treatment/Interventions ADLs/Self Care Home Management;Gait training;Functional mobility training;Therapeutic activities;Therapeutic exercise;Balance training;Neuromuscular re-education;DME Instruction;Patient/family education     PT Next Visit Plan continue to work on core sterngthening incorporating hip and knee strengthening as underlying pain and pathologies allowed, discuss DC plans    PT Home Exercise Plan Access Code: [email or web address]    Consulted and Agree with Plan of Care Patient;Family member/caregiver    Family Member Consulted granddaughter             Patient will benefit from skilled therapeutic intervention in order to improve the following deficits and impairments:  Abnormal gait, Difficulty walking, Decreased balance, Decreased mobility, Decreased strength  Visit Diagnosis: Unsteadiness on feet  Muscle weakness (generalized)  Other abnormalities of gait and mobility     Problem List Patient Active Problem List   Diagnosis Date Noted   History of completed stroke 10/31/2020   Stroke ([place]) 10/06/2020   Lipoma 08/26/2020   Asthma 08/26/2020   Bilateral leg pain 08/25/2020   Encounter for well adult exam with abnormal findings 05/06/2020   Allergic rhinitis 05/06/2020   Statin myopathy 05/02/2020   Left shoulder pain 11/01/2019   Chronic low back pain 05/03/2019   B12 deficiency 05/03/2019   Left leg swelling 11/16/2018   Left knee pain 11/16/2018   Low back pain 05/14/2018   Abnormal SPEP 11/18/2017   Sternoclavicular joint pain, left 08/19/2017   Cervical radiculopathy at C8 05/15/2017   Pre-ulcerative corn or callous 03/27/2017   Vitamin D deficiency 03/27/2017   Elevated total protein 03/27/2017   Bilateral shoulder pain 03/27/2017   Medicare annual wellness visit, subsequent 03/06/2016   Spondylolisthesis at L4-L5 level 10/06/2015   Acute frontal sinusitis 07/14/2015   Sleep disturbance 07/14/2015   Knee pain, left 11/10/2014   Sprain of ankle 04/11/2014   GERD (gastroesophageal reflux disease) 03/07/2014   Prolapse of vaginal wall with midline cystocele, grade 1 08/23/2013   Vaginal dryness 05/14/2012   Dry mouth 05/13/2012   Depression and insomnia 07/19/2010    Multilevel degenerative disc disease of cervical and lumbar spine  01/16/2008   Essential hypertension 01/01/2008   Diabetes ([place]) 03/18/2007   Hyperlipidemia 02/13/2007   SLEEP APNEA 02/13/2007    Lanice Shirts PT 11/13/2020, 3:30 PM  [facility] [address] [place], [place], [address] Phone: [phone]   Fax:  [phone]  Name: Tiffany [name] MRN: [id number] Date of Birth: 1950-10-10

## 2020-11-15 ENCOUNTER — Other Ambulatory Visit: Payer: Self-pay | Admitting: Physician Assistant

## 2020-11-15 ENCOUNTER — Other Ambulatory Visit: Payer: Self-pay

## 2020-11-15 ENCOUNTER — Other Ambulatory Visit: Payer: Self-pay | Admitting: *Deleted

## 2020-11-15 DIAGNOSIS — R9431 Abnormal electrocardiogram [ECG] [EKG]: Secondary | ICD-10-CM

## 2020-11-15 DIAGNOSIS — I639 Cerebral infarction, unspecified: Secondary | ICD-10-CM

## 2020-11-15 NOTE — Patient Outreach (Signed)
Tiffany [name]) Care Management  11/15/2020  Tiffany [name] 06/10/1950 [id number]   [facility] follow up outreach to [name] referred/complex care patient   Ms Tiffany [name] was referred to [facility] on 10/16/20 for EMMI stroke referral for red alert reason: Scheduled a follow-up appointment? I Don't Know (after a call to her on Saturday 10/14/20 at 1400) and an EMMI stroke referral on 10/27/20 for 10/26/20 Thursday day #13 outreach referral reason: had follow up appointment? No   Insurance blue cross and blue shield medicare [facility] admission x 1 Last [place] admission 10/06/20-10/08/20 for left MCA infarct -high grade stenosis, hypertension, diabetes, obstructive sleep disorder, hyperlipidemia   Patient is able to verify HIPAA ([place]) identifiers Reviewed and addressed the purpose of the follow up call with the patient   Consent: [facility] ([place] RN CM reviewed [name] services with patient. Patient gave verbal consent for services.   Follow up assessment Continues with eye strain generally sees Dr Gershon Crane  Last seen prior to stroke and reports the exam showed no issues Encouraged her to address these symptoms with Maricela Bo on 11/16/20 also No severe visual changes Importance eye exams discussed related to possible changes post CVA    Still attending outpatient therapist  She is able to review her appointments without difficulty  Cardiac She confirms she mailed her heart monitor back this Monday- Pending review by Cardiology on next visit on 12/06/20  Reviewed sleep study testing processing with her - answered questions She voice interest in having a sleep study in a facility vs at home  She reports poor sleep related to her neighbors Reports she is a light sleeper Reports she sleeps a lot on her left side with her hand under head or on her neck    She voices she is aware that post CVA she is having more mobility issues and  pain with her knee and shoulder Discussed pre medicating before sessions and using ice after the sessions Discussed and answered questions about stroke symptoms  BP cuff still needed -She will use a card provided to her by her insurance to obtain this  Plans Patient agrees to care plan and follow up within the next 30 business days Pt encouraged to return a call to [name] RN CM and 24 hour nurse prn  Goals Addressed               This Visit's Progress     Patient Stated     [facility]) Keep or Improve My Strength-Stroke (pt-stated)   On track     Timeframe:  Long-Range Goal Priority:  High Start Date:         10/16/20                    Expected End Date:     12/22/20                  Follow Up Date 12/07/20  Barriers: Knowledge  - attend 90 percent of physical therapy appointments - eat healthy to increase strength - increase activity or exercise time a little every week - join an activity or exercise group in community - plan activity for times when energy is the highest  -Premedicate prior to therapy sessions and use ice after sessions    Notes:  11/15/20 continues outpatient rehab >90%, strength improving but notes increased pain of left shoulder, left knee which are from pre stroke issues- will begin  to premedicate prior to therapy sessions and use ice after sessions -appetite is good 11/01/20  getting better daily with less right sided weakness; less episodes of dropping items with the right hand; reports completing a lot of errands and home tasks today to include increase walking and laundry; uses a cane; continues neuro rehab sessions but report  "over did it" in therapy - left shoulder and knee pain       COMPLETED: [name]) Make and Keep All Appointments (pt-stated)   On track     Timeframe:  Short-Term Goal Priority:  High Start Date:                10/16/20             Expected End Date:       11/23/20                Goal complete Date 11/15/2020  Barriers: Knowledge  -  ask family or friend for a ride - call to cancel if needed - keep a calendar with appointment dates     Notes:  11/15/20 Goal completed/met Attending all appointments- To see Neurology on 11/16/20 with assist of family Able to verbalize all other future appointments, will make eye exam appointment soon 11/01/20 she did not call to reschedule 11/16/20 appointment agreed to a conference call to neurology office with [facility] RN CM 11/01/20 to inquire about change and placed on office appointment cancellation list She will outreach to her insurance to inquire about transportation benefit          Briant Angelillo [name], RN, BSN, [place] Coordinator Office number [phone] [facility] number [phone] Fax number [phone]

## 2020-11-16 ENCOUNTER — Encounter: Payer: Self-pay | Admitting: Adult Health

## 2020-11-16 ENCOUNTER — Ambulatory Visit (INDEPENDENT_AMBULATORY_CARE_PROVIDER_SITE_OTHER): Payer: Medicare Other | Admitting: Adult Health

## 2020-11-16 VITALS — BP 148/87 | HR 78 | Ht 62.0 in | Wt 157.0 lb

## 2020-11-16 DIAGNOSIS — E1165 Type 2 diabetes mellitus with hyperglycemia: Secondary | ICD-10-CM

## 2020-11-16 DIAGNOSIS — G4733 Obstructive sleep apnea (adult) (pediatric): Secondary | ICD-10-CM

## 2020-11-16 DIAGNOSIS — Z794 Long term (current) use of insulin: Secondary | ICD-10-CM

## 2020-11-16 DIAGNOSIS — I639 Cerebral infarction, unspecified: Secondary | ICD-10-CM

## 2020-11-16 DIAGNOSIS — I10 Essential (primary) hypertension: Secondary | ICD-10-CM | POA: Diagnosis not present

## 2020-11-16 DIAGNOSIS — E785 Hyperlipidemia, unspecified: Secondary | ICD-10-CM

## 2020-11-16 NOTE — Patient Instructions (Addendum)
Continue aspirin 325 mg daily and clopidogrel 75 mg daily  and pravastatin 40mg  daily  for secondary stroke prevention Continue plavix (clopidogrel) until you complete prescription (3 month total duration) and then stop - you will continue on aspirin alone after that   Continue to follow up with PCP regarding cholesterol, blood pressure  and diabetes management  Maintain strict control of hypertension with blood pressure goal below 130/90, diabetes with hemoglobin A1c goal below 6.5% and cholesterol with LDL cholesterol (bad cholesterol) goal below 70 mg/dL.   Referral placed to our sleep clinic to establish care and restart on your sleep apnea machin    Followup in the future with me in 4 months or call earlier if needed       Thank you for coming to see [place] at [facility]. I hope we have been able to provide you high quality care today.  You may receive a patient satisfaction survey over the next few weeks. We would appreciate your feedback and comments so that we may continue to improve ourselves and the health of our patients.

## 2020-11-16 NOTE — Progress Notes (Signed)
[name] [address] [place]. [place] [address] [phone]       [place] Date of Birth:  [birth year] Medical Record Number:  [id number]   Reason for Referral:  hospital stroke follow up    SUBJECTIVE:   CHIEF COMPLAINT:  Chief Complaint  Patient presents with   Follow-up    TR alone PT is well, has some R sided weakness/pain/numbness. Some facial soreness, vision complications     HPI:   Tiffany [name] is a 70 yo female with a PMHx of OSA without use of CPAP, HTN, HLD, DM II, and DJD who presented on 10/06/2020 with right sided weakness. Personally reviewded hospitalization pertitent progress ntoes, lab work and imaging with summary provided. Stroke work up revealed left MCA infarct, likely due to L M2 high grade stenosis. Recommended 30 day cardiac event monitor outpatient to rule out A fib as potential etiology. Recommended 3 months DAPT then aspirin alone. LDL 159 - started pravastatin in addition to home zetia with hx of statin intolerance. A1c 7.4. OSA not on CPAP. Evaluated by therapies with recommended outpatient PT and discharged home in stable condition.  Stroke:  left MCA infarct s/p tPA, likely due to left M2 high-grade stenosis MRI left parietal, MCA/PCA, left MCA/ACA infarcts MRA head and neck left M2 high-grade stenosis 2D Echo EF 65 to 70% Will do 30-day cardiac event monitoring as outpatient to rule out A. Fib (Trish messaged) LDL 159 HgbA1c 7.4 UDS pending SCDs for VTE prophylaxis No antithrombotic prior to admission, now on ASA EC 325 and clopidogrel 75 mg daily for 3 months and then ASA alone.  Patient counseled to be compliant with her antithrombotic medications Ongoing aggressive stroke risk factor management Therapy recommendations: out pt PT Disposition: home today  Today, 11/16/2020, Tiffany [name] is being seen for hospital follow-up unaccompanied.  Reports improvement of RUE strength with some  weakness in wrist and forearm as well as shoulder fatigue/pain but has been gradually improving. Reports fatiguing quicker than prior to her stroke.  She does have chronic joint pain which she believes slightly worsened post stroke including right hip, left knee and shoulders.  She has been working with outpatient PT. Lives on own able to maintain all ADLs and IADLs independently. PCP Dr. Jenny Reichmann recently referred to ortho for ongoing knee pain - she is currently waiting to schedule.  Denies new stroke/TIA symptoms.  Remains on aspirin, Plavix and pravastatin without associated side effects.  Blood pressure today 148/87. Monitors glucose levels at home - typically 100-120s.  Cardiac monitor completed 6/18 - currently waiting results.  Prior history of sleep apnea but has not used CPAP machine "in many years" as she believes her old one was not working correctly.  No further concerns at this time.       ROS:   14 system review of systems performed and negative with exception of those listed in HPI  PMH:  Past Medical History:  Diagnosis Date   Degenerative joint disease    Diabetes mellitus    Headache    Hyperlipidemia    Hypertension    Sleep apnea    stopped using it    PSH:  Past Surgical History:  Procedure Laterality Date   ABDOMINAL HYSTERECTOMY  1976   BACK SURGERY     KNEE ARTHROSCOPY     left x 2   LUMBAR LAMINECTOMY  1989    Social History:  Social History   Socioeconomic  History   Marital status: Single    Spouse name: Not on file   Number of children: 2   Years of education: 8   Highest education level: 8th grade  Occupational History   Occupation: Disability    Comment: retired day Runner, broadcasting/film/video  Tobacco Use   Smoking status: Former    Packs/day: 0.50    Years: 15.00    Pack years: 7.50    Types: Cigarettes    Quit date: 08/02/1990    Years since quitting: 30.3   Smokeless tobacco: Never  Vaping Use   Vaping Use: Never used  Substance and Sexual Activity    Alcohol use: No    Alcohol/week: 0.0 standard drinks   Drug use: No   Sexual activity: Never  Other Topics Concern   Not on file  Social History Narrative   Born in [place], [place].   Completed the 8th grade   Fun: Walk, crochet, puzzles   Diet - Variety of foods, does not eat breakfast   2 children - 44 and 45    6 grandchildren - 7 great grandchildren.    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   Investment banker, operational of Sales executive: Low Risk    Difficulty of Paying Living Expenses: Not hard at all  Food Insecurity: No Food Insecurity   Worried About Charity fundraiser in the Last Year: Never true   Arboriculturist in the Last Year: Never true  Transportation Needs: No Transportation Needs   Lack of Transportation (Medical): No   Lack of Transportation (Non-Medical): No  Physical Activity: Sufficiently Active   Days of Exercise per Week: 5 days   Minutes of Exercise per Session: 30 min  Stress: No Stress Concern Present   Feeling of Stress : Not at all  Social Connections: Moderately Integrated   Frequency of Communication with Friends and Family: More than three times a week   Frequency of Social Gatherings with Friends and Family: Once a week   Attends Religious Services: More than 4 times per year   Active Member of [employer] or Organizations: No   Attends Music therapist: More than 4 times per year   Marital Status: Widowed  Human resources officer Violence: Not At Risk   Fear of Current or Ex-Partner: No   Emotionally Abused: No   Physically Abused: No   Sexually Abused: No    Family History:  Family History  Problem Relation Age of Onset   Hypertension Mother    Diabetes Mother    Thyroid disease Mother    Heart disease Mother    Kidney disease Mother    Diabetes Sister    Hypertension Brother    Stroke Brother    Heart disease Brother    Colon cancer Neg Hx    Colon polyps Neg Hx    Rectal cancer Neg Hx     Stomach cancer Neg Hx     Medications:   Current Outpatient Medications on File Prior to Visit  Medication Sig Dispense Refill   albuterol (VENTOLIN HFA) 108 (90 Base) MCG/ACT inhaler Inhale 2 puffs into the lungs every 6 (six) hours as needed for wheezing or shortness of breath. 8 g 5   amLODipine (NORVASC) 5 MG tablet TAKE 1 TABLET BY MOUTH EVERY DAY (Patient taking differently: Take 5 mg by mouth daily.) 90 tablet 3   aspirin EC 325 MG EC tablet Take 1 tablet (325 mg total)  by mouth daily. 30 tablet 0   Blood Glucose Monitoring Suppl (ONE TOUCH ULTRA 2) w/Device KIT Apply 1 Device topically in the morning and at bedtime. E11.9 1 kit 0   clopidogrel (PLAVIX) 75 MG tablet Take 1 tablet (75 mg total) by mouth daily. 90 tablet 0   conjugated estrogens (PREMARIN) vaginal cream Place 1 Applicatorful vaginally daily. 3 days a week     EPINEPHrine 0.3 mg/0.3 mL IJ SOAJ injection Inject 0.3 mLs (0.3 mg total) into the muscle as needed for anaphylaxis. 2 each 1   estradiol (ESTRACE) 0.1 MG/GM vaginal cream Place vaginally.     ezetimibe (ZETIA) 10 MG tablet TAKE 1 TABLET BY MOUTH EVERY DAY 90 tablet 3   famotidine (PEPCID) 20 MG tablet Take 1 tablet (20 mg total) by mouth 2 (two) times daily. 180 tablet 3   gabapentin (NEURONTIN) 400 MG capsule TAKE 1 CAPSULE BY MOUTH THREE TIMES DAILY (Patient taking differently: Take 400 mg by mouth 2 (two) times daily.) 270 capsule 1   glucose blood (ONETOUCH ULTRA) test strip USE AS DIRECTED TWICE DAILY 200 each 0   HYDROcodone-acetaminophen (NORCO/VICODIN) 5-325 MG tablet Take 1 tablet by mouth every 6 (six) hours as needed. 15 tablet 0   Insulin Pen Needle 32G X 4 MM MISC Use to inject  lantus once daily (Patient taking differently: Use to inject  lantus once daily) 100 each 4   Lancets MISC Apply 1 Device topically in the morning and at bedtime. Use twice a day. Once in the morning and once at bed time. DX: E11.9.. Soft Click Lancets, OneTouch Delica Plus Lancing  Device 200 each 11   LANTUS SOLOSTAR 100 UNIT/ML Solostar Pen INJECT 55 UNITS INTO THE SKIN DAILY. 100 mL 2   losartan (COZAAR) 100 MG tablet Take 0.5 tablets (50 mg total) by mouth daily. 90 tablet 3   pravastatin (PRAVACHOL) 40 MG tablet Take 1 tablet (40 mg total) by mouth daily at 6 PM. 30 tablet 5   quinapril (ACCUPRIL) 40 MG tablet Take 40 mg by mouth daily.     tiZANidine (ZANAFLEX) 4 MG tablet TAKE 1 TABLET BY MOUTH EVERY 8 HOURS AS NEEDED FOR MUSCLE SPASM (Patient taking differently: Take 4 mg by mouth every 8 (eight) hours as needed for muscle spasms.) 90 tablet 5   triamcinolone (NASACORT) 55 MCG/ACT AERO nasal inhaler Place 2 sprays into the nose daily. (Patient taking differently: Place 2 sprays into the nose daily as needed (for allergies).) 3 each 3   vitamin B-12 (CYANOCOBALAMIN) 1000 MCG tablet Take 1 tablet by mouth once daily 90 tablet 0   zolpidem (AMBIEN) 10 MG tablet Take 1 tablet (10 mg total) by mouth at bedtime as needed. (Patient taking differently: Take 10 mg by mouth at bedtime as needed for sleep.) 90 tablet 1   traMADol (ULTRAM) 50 MG tablet Take 1 tablet (50 mg total) by mouth every 6 (six) hours as needed. (Patient not taking: Reported on 11/16/2020) 30 tablet 0   No current facility-administered medications on file prior to visit.    Allergies:   Allergies  Allergen Reactions   Contrast Media [Iodinated Diagnostic Agents] Anaphylaxis, Nausea And Vomiting, Swelling and Other (See Comments)    Patients throat closes with nausea and vomiting    Aspirin Nausea And Vomiting and Other (See Comments)    Can take EC aspirin   Gadolinium Derivatives Nausea And Vomiting   Metformin And Related Nausea And Vomiting and Other (See Comments)  Severe nausea and vomiting   Shellfish Allergy Nausea And Vomiting   Tramadol Nausea And Vomiting and Other (See Comments)    Migraines   Penicillins Nausea And Vomiting   Statins Other (See Comments)    Myalgias        OBJECTIVE:  Physical Exam  Vitals:   11/16/20 1358  BP: (!) 148/87  Pulse: 78  Weight: 157 lb (71.2 kg)  Height: 5' 2"  (1.575 m)   Body mass index is 28.72 kg/m. No results found.  Post stroke PHQ 2/9 Depression screen PHQ 2/9 11/01/2020  Decreased Interest 0  Down, Depressed, Hopeless 0  PHQ - 2 Score 0  Some recent data might be hidden     General: well developed, well nourished, pleasant elderly AA female, seated, in no evident distress Head: head normocephalic and atraumatic.   Neck: supple with no carotid or supraclavicular bruits Cardiovascular: regular rate and rhythm, no murmurs Musculoskeletal: no deformity Skin:  no rash/petichiae Vascular:  Normal pulses all extremities   Neurologic Exam Mental Status: Awake and fully alert. Mild speech hesitancy. Oriented to place and time. Recent and remote memory intact. Attention span, concentration and fund of knowledge appropriate. Mood and affect appropriate.  Cranial Nerves: Fundoscopic exam reveals sharp disc margins. Pupils equal, briskly reactive to light. Extraocular movements full without nystagmus. Visual fields full to confrontation. Hearing intact. Facial sensation intact. Face, tongue, palate moves normally and symmetrically.  Motor: Normal bulk and tone. Normal strength in all tested extremity muscles except slightly decreased right hand dexterity Sensory.: intact to touch , pinprick , position and vibratory sensation.  Coordination: Rapid alternating movements normal in all extremities except slightly decreased right hand. Finger-to-nose and heel-to-shin performed accurately bilaterally. Gait and Station: Arises from chair without difficulty. Stance is normal. Gait demonstrates normal stride length and balance without use of assistive device.  Reflexes: 1+ and symmetric. Toes downgoing.     NIHSS  0 Modified Rankin  2      ASSESSMENT: Tiffany [name] is a 70 y.o. year old female presented with  right sided weakness on 10/06/2020 in setting of L MCA stroke likely secondary to left M2 high grade stenosis. Vascular risk factors include HTN, HLD, DM, and OSA not on CPAP     PLAN:  L MCA stroke :  Residual deficit: mild right hand weakness and worsening baseline multiple joint pain - continue working with therapy for likely ongoing recovery. Ensure follow up with ortho for joint pain.  Continue aspirin 325 mg daily and clopidogrel 75 mg daily  and pravastatin  for secondary stroke prevention.   Discussed secondary stroke prevention measures and importance of close PCP follow up for aggressive stroke risk factor management  HTN: BP goal <130/90.  Stable on current regimen per PCP HLD: LDL goal <70. Recent LDL 159 - Continue pravastatin we will plan on repeating lipid panel with PCP in the near future.  DMII: A1c goal<7.0. Recent A1c 7.4 monitored by PCP.  Sleep apnea: known hx previously on CPAP. Referral placed to [facility] for repeat sleep study and to establish care with sleep provider    Follow up in 4 months or call earlier if needed   CC:  [place] provider: Dr. Leonie Man PCP: Biagio Borg, MD    I spent 52 minutes of face-to-face and non-face-to-face time with patient.  This included previsit chart review including recent hospitalization, lab review, study review, order entry, electronic health record documentation, patient education and discussion regarding recent  stroke and likely etiology, secondary stroke prevention and importance of managing stroke risk factors, residual deficits and typical recovery time and answered all other questions to patient satisfaction  Frann Rider, [facility]  [facility] [address] [place], [place] [address]  Phone [phone] Fax [phone] Note: This document was prepared with digital dictation and possible smart phrase technology. Any transcriptional errors that result from this process are  unintentional.

## 2020-11-17 ENCOUNTER — Other Ambulatory Visit: Payer: Self-pay

## 2020-11-17 ENCOUNTER — Ambulatory Visit: Payer: Medicare Other

## 2020-11-17 DIAGNOSIS — M6281 Muscle weakness (generalized): Secondary | ICD-10-CM

## 2020-11-17 DIAGNOSIS — R2681 Unsteadiness on feet: Secondary | ICD-10-CM

## 2020-11-17 DIAGNOSIS — R2689 Other abnormalities of gait and mobility: Secondary | ICD-10-CM

## 2020-11-17 NOTE — Therapy (Signed)
[facility] [address] [place], [place], [address] Phone: [phone]   Fax:  [phone]  Physical Therapy Treatment/DC Summary  Patient Details  Name: Tiffany [name] MRN: [id number] Date of Birth: [birth year] Referring Provider (PT): Rosalin Hawking, MD   Encounter Date: 11/17/2020  PHYSICAL THERAPY DISCHARGE SUMMARY  Visits from Start of Care: 9  Current functional level related to goals / functional outcomes: Goals met   Remaining deficits: Residual pain to be addressed by ortho   Education / Equipment: HEP   Patient agrees to discharge. Patient goals were met. Patient is being discharged due to being pleased with the current functional level.    PT End of Session - 11/17/20 1410     Visit Number 9    Number of Visits 9    Date for PT Re-Evaluation 12/02/20   4-wk POC, 19 day cert for scheduling   Authorization Type BCBS Medicare    PT Start Time 1405    PT Stop Time 1445    PT Time Calculation (min) 40 min    Equipment Utilized During Treatment Gait belt    Activity Tolerance Patient tolerated treatment well    Behavior During Therapy WFL for tasks assessed/performed             Past Medical History:  Diagnosis Date   Degenerative joint disease    Diabetes mellitus    Headache    Hyperlipidemia    Hypertension    Sleep apnea    stopped using it    Past Surgical History:  Procedure Laterality Date   ABDOMINAL HYSTERECTOMY  1976   BACK SURGERY     KNEE ARTHROSCOPY     left x 2   LUMBAR LAMINECTOMY  1989    There were no vitals filed for this visit.   Subjective Assessment - 11/17/20 1408     Subjective Reporting R LE "not working right" today, feels she is ready for independent management    Patient is accompained by: Family member   granddaughter, [name]   Pertinent History PMH includes HTN, OSA, IDDM II, and HLD.    Patient Stated Goals Pt's goals for therapy are to get R hand  and R side stronger.    Pain Onset More than a month ago    Pain Onset More than a month ago                                            PT Long Term Goals - 11/17/20 1411       PT LONG TERM GOAL #1   Title Pt will be independent with HEP for improved strength, balance, transfers, and gait.  TARGET 11/17/2020    Baseline 11/13/20 HEP reviewed; 11/17/20 Goal met    Time 4    Period Weeks    Status Achieved      PT LONG TERM GOAL #2   Title Pt will improve FGA score to at least 21/30 to decrease fall risk.    Baseline 17/30; 11/13/20 FGA score 23, goal met    Time 4    Period Weeks    Status Achieved      PT LONG TERM GOAL #3   Title Pt will improve TUG score to less than or equal to 13.5 sec for decreased fall risk.    Baseline 14.37 sec; 11/13/20 TUG  score w/o AD 11.4s    Time 4    Period Weeks    Status Achieved      PT LONG TERM GOAL #4   Title Pt will improve gait velocity to at least 3 ft/sec for improved gait efficiency in community.    Baseline 2.7 ft/sec; 11/17/20 1.9 ft.s    Time 4    Period Weeks    Status Partially Met                   Plan - 11/17/20 1439     Clinical Impression Statement Todays session assessed goals for DC, reviwed HEP and prepped for DC    Personal Factors and Comorbidities Comorbidity 3+    Comorbidities PMH includes HTN, OSA, IDDM II, and HLD; pt reports PMH of L knee pain and back pain limiting stairs as well as reported RLE shorter than LLE    Examination-Activity Limitations Locomotion Level;Transfers;Stand    Examination-Participation Restrictions Community Activity;Shop;Cleaning;Meal Prep    Stability/Clinical Decision Making Evolving/Moderate complexity    Rehab Potential Good    PT Frequency 2x / week    PT Duration 4 weeks   plus eval   PT Treatment/Interventions ADLs/Self Care Home Management;Gait training;Functional mobility training;Therapeutic activities;Therapeutic exercise;Balance  training;Neuromuscular re-education;DME Instruction;Patient/family education    PT Next Visit Plan DC to HEP and recommended Ortho consult    PT Home Exercise Plan Access Code: [email or web address]    Consulted and Agree with Plan of Care Patient;Family member/caregiver    Family Member Consulted granddaughter             Patient will benefit from skilled therapeutic intervention in order to improve the following deficits and impairments:  Abnormal gait, Difficulty walking, Decreased balance, Decreased mobility, Decreased strength  Visit Diagnosis: Unsteadiness on feet  Other abnormalities of gait and mobility  Muscle weakness (generalized)     Problem List Patient Active Problem List   Diagnosis Date Noted   History of completed stroke 10/31/2020   Stroke ([place]) 10/06/2020   Lipoma 08/26/2020   Asthma 08/26/2020   Bilateral leg pain 08/25/2020   Encounter for well adult exam with abnormal findings 05/06/2020   Allergic rhinitis 05/06/2020   Statin myopathy 05/02/2020   Left shoulder pain 11/01/2019   Chronic low back pain 05/03/2019   B12 deficiency 05/03/2019   Left leg swelling 11/16/2018   Left knee pain 11/16/2018   Low back pain 05/14/2018   Abnormal SPEP 11/18/2017   Sternoclavicular joint pain, left 08/19/2017   Cervical radiculopathy at C8 05/15/2017   Pre-ulcerative corn or callous 03/27/2017   Vitamin D deficiency 03/27/2017   Elevated total protein 03/27/2017   Bilateral shoulder pain 03/27/2017   Medicare annual wellness visit, subsequent 03/06/2016   Spondylolisthesis at L4-L5 level 10/06/2015   Acute frontal sinusitis 07/14/2015   Sleep disturbance 07/14/2015   Knee pain, left 11/10/2014   Sprain of ankle 04/11/2014   GERD (gastroesophageal reflux disease) 03/07/2014   Prolapse of vaginal wall with midline cystocele, grade 1 08/23/2013   Vaginal dryness 05/14/2012   Dry mouth 05/13/2012   Depression and insomnia 07/19/2010   Multilevel degenerative disc  disease of cervical and lumbar spine  01/16/2008   Essential hypertension 01/01/2008   Diabetes ([place]) 03/18/2007   Hyperlipidemia 02/13/2007   SLEEP APNEA 02/13/2007    Lanice Shirts PT 11/17/2020, 2:46 PM  [facility] [address] [place], [place], [address] Phone: [phone]   Fax:  [phone]  Name: Tiffany [name] MRN: [id number] Date of Birth: 12-25-1950

## 2020-11-23 ENCOUNTER — Other Ambulatory Visit: Payer: Self-pay | Admitting: Internal Medicine

## 2020-11-24 NOTE — Progress Notes (Signed)
I agree with the above plan 

## 2020-12-06 ENCOUNTER — Other Ambulatory Visit: Payer: Self-pay

## 2020-12-06 ENCOUNTER — Ambulatory Visit: Payer: Medicare Other | Admitting: Cardiovascular Disease

## 2020-12-06 ENCOUNTER — Encounter: Payer: Self-pay | Admitting: Cardiovascular Disease

## 2020-12-06 ENCOUNTER — Telehealth: Payer: Self-pay | Admitting: Adult Health

## 2020-12-06 VITALS — BP 138/66 | HR 94 | Ht 62.0 in | Wt 158.6 lb

## 2020-12-06 DIAGNOSIS — E7800 Pure hypercholesterolemia, unspecified: Secondary | ICD-10-CM

## 2020-12-06 DIAGNOSIS — I472 Ventricular tachycardia: Secondary | ICD-10-CM | POA: Diagnosis not present

## 2020-12-06 DIAGNOSIS — Z794 Long term (current) use of insulin: Secondary | ICD-10-CM

## 2020-12-06 DIAGNOSIS — I639 Cerebral infarction, unspecified: Secondary | ICD-10-CM

## 2020-12-06 DIAGNOSIS — G4733 Obstructive sleep apnea (adult) (pediatric): Secondary | ICD-10-CM | POA: Diagnosis not present

## 2020-12-06 DIAGNOSIS — E1165 Type 2 diabetes mellitus with hyperglycemia: Secondary | ICD-10-CM

## 2020-12-06 DIAGNOSIS — I10 Essential (primary) hypertension: Secondary | ICD-10-CM

## 2020-12-06 DIAGNOSIS — I679 Cerebrovascular disease, unspecified: Secondary | ICD-10-CM

## 2020-12-06 DIAGNOSIS — I739 Peripheral vascular disease, unspecified: Secondary | ICD-10-CM

## 2020-12-06 DIAGNOSIS — I4729 Other ventricular tachycardia: Secondary | ICD-10-CM

## 2020-12-06 NOTE — Telephone Encounter (Signed)
-----   Message from Rosalin Hawking, MD sent at 12/06/2020  3:27 PM EDT ----- Hi, Dr. C:  Thanks for the message. Glad to hear that pt is doing well. When she had stroke in May, her MRA head showed left M2 high grade stenosis, although the radiology stated that it could be artifact. I think we should repeat CTA head and neck to revisit her vasculature, if she did have left M2 stenosis, with her risk factor of HTN, HLD, DM, and OSA with negative 30 day monitor, I think most likely large vessel disease. If repeat CTA negative, then I agree with loop recorder. I will let our stroke NP Janett Billow to order the CTA and will keep you posted. Thanks again.   Jindong   ----- Message ----- From: Sanda Klein, MD Sent: 12/06/2020   1:52 PM EDT To: Rosalin Hawking, MD  [address], [address] This lady is recovering well from her stroke in May. Event monitor did not show AFib. Her MRI showed:  "1. Scattered areas of acute/subacute cortical infarction involving the high left parietal lobe, superior aspect of the left occipital lobe, and minimal involvement of the right frontal lobe." And her MRA did not show significant disease. Transthoracic echo was normal (did not have TEE). Stroke was on the weekend, DC on Sunday. I am thinking that she should still have an implantable loop recorder, but wanted to get your opinion a well. Do you agree? Thanks! Mihai

## 2020-12-06 NOTE — Patient Instructions (Addendum)
Medication Instructions: No changes  * If you need a refill on your cardiac medications before your next appointment, please call your pharmacy.   Labwork: Your provider would like for you to return in 2 months to have the following labs drawn: fasting Lipid. You do not need an appointment for the lab. Once in our office lobby there is a podium where you can sign in and ring the doorbell to alert [place] that you are here. The lab is open from 8:00 am to 4:30 pm; closed for lunch from 12:45pm-1:45pm.  If you have labs (blood work) drawn today and your tests are completely normal, you will receive your results only by: [place] (if you have MyChart) OR A paper copy in the mail If you have any lab test that is abnormal or we need to change your treatment, we will call you to review the results.  Testing/Procedures: Your physician has recommended that you have a loop recorder implant. Please follow the instructions below, located under the special instructions section.  Follow-Up: Your physician recommends that you schedule a wound check appointment 10-14 days, after your procedure on 12/25/20, with the device clinic.  Follow-Up: At [facility], you and your health needs are our priority.  As part of our continuing mission to provide you with exceptional heart care, we have created designated Provider Care Teams.  These Care Teams include your primary Cardiologist (physician) and Advanced Practice Providers (APPs -  Physician Assistants and Nurse Practitioners) who all work together to provide you with the care you need, when you need it.  Your next appointment:   3 month(s)  The format for your next appointment:   In Person  Provider:   Sanda Klein, MD  Special Instructions:   [facility] [place], [employer]  [place], [place] [address]  Phone: [phone] Fax: [phone]   You are scheduled for a Loop Recorder Implant on 12:20 pm  on 12/25/20 with Dr. Sallyanne Kuster. This will take place at [address], [address].    Please arrive at your appointment 15-20 minutes early.   You do not need to be fasting.   The procedure is performed with local anesthesia. You will not receive sedatives nor will an IV be placed.   Wash your chest and neck with the surgical soap the evening before and the morning of your procedure. Please following the washing instructions provided.   As with all surgical implants, there is a small risk of infection. If an infection occurs, the device will be removed. To help reduce the risk, please use the surgical scrub provided. Additional antiseptic precautions will be taken at the time of the procedure.   Please bring your insurance cards.   *Please note that scheduled loop recorder implants may need to be rescheduled if Dr. Sallyanne Kuster has a procedure urgently added on at the hospital   Preparing for the Procedure  Before the procedure, you can play an important role. Because skin is not sterile, your skin needs to be as free of germs as possible. You can reduce the number of germs on your skin by washing with CHG (chlorhexidine gluconate) Soap before the procedure. CHG is an antiseptic cleaner which kills germs and bonds with the skin to continue killing germs even after washing.  Please do not use if you have an allergy to CHG or antibacterial soaps. If your skin becomes reddened/irritated, STOP using the CHG.  DO NOT SHAVE (including legs  and underarms) for at least 48 hours prior to first CHG shower. It is OK to shave your face.  Please follow these instructions carefully: Shower the night before the procedure and the morning of with CHG Soap. If you chose to wash your hair, wash your hair first as usual with your normal shampoo/conditioner. After you shampoo/condition, rinse you hair and body thoroughly to remove shampoo/conditioner. Use CHG as you would any other liquid soap. You can apply  CHG directly to the skin and wash gently with a loofah or a clean washcloth. Apply the CHG Soap to your body ONLY FROM THE NECK DOWN. Do not use on open wounds or open sores. Avoid contact with your eyes, ears, mouth, and genitals (private parts).  Wash thoroughly, paying special attention to the area where your surgery will be performed. Thoroughly rinse your body with warm water from the neck down. DO NOT shower/wash with your normal soap after using and rinsing off the CHG Soap. Pat yourself dry with a clean towel. Wear clean pajamas to bed. Place clean sheets on your bed the night of your first shower and do not sleep with pets..  Day of Surgery: Shower with the CHG Soap following the instructions listed above. DO NOT apply deodorants or lotions.

## 2020-12-06 NOTE — Telephone Encounter (Signed)
Dr. 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 Please let me know what you think and I will place order! Thank you!

## 2020-12-06 NOTE — Progress Notes (Signed)
Cardiology Office Note:    Date:  12/08/2020   ID:  Tiffany [name], [place] [birth year], MRN [id number]  PCP:  Tiffany Borg, MD   [facility] Providers Cardiologist:  None     Referring MD: Tiffany Borg, MD   Chief Complaint  Patient presents with   Irregular Heart Beat   Tiffany [name] is a 70 y.o. female who is being seen today for the evaluation of arrhythmia at the request of Tiffany Borg, MD.   History of Present Illness:    Tiffany [name] is a 70 y.o. female with a hx of diabetes mellitus, hyperlipidemia and hypertension, lumbar spine disease who had a stroke a couple of months ago.  Her work-up included an event monitor that did not show atrial fibrillation and only showed rare PACs and PVCs but also a single 8 beat run of nonsustained ventricular tachycardia.  The episode was asymptomatic.  She is referred for further evaluation.  She does not have a history of CAD or PAD. She denies shortness of breath or chest pain at rest or with activity, orthopnea, PND or lower extremity edema.  She complains of squeezing cramp-like pain in her calves but this occurs both at rest and with activity.  ABIs performed recently showed normal values.    She has recovered well from her stroke.  She presented with right-sided weakness on 10/06/2020 and received tPA with improvement in her neurological deficits.  She was in normal sinus rhythm throughout her hospitalization and her transthoracic echocardiogram was unremarkable.    Imaging studies in the hospital showed mostly left parietal and occipital scattered ischemic injuries, although there is a mention of "minimal involvement of the right frontal lobe".  The notes during the hospitalization show evidence of signal loss in the  inferior left M2 segment, but the radiology interpretation suggests that this may have been artifactual.  There was no evidence of significant carotid disease or vertebral disease.  She has a follow-up appointment  with neurology on August 24, but this appears to be a sleep consult with Dr. Brett [name] rather than necessarily a follow-up versus referral.  I think for that, she has an appointment with Tiffany [name] in October.  In the hospital she was seen by Dr. Rosalin [name].  The echocardiogram performed during her hospitalization for stroke in May shows normal left and right ventricular systolic function in the absence of any meaningful valvular abnormalities.  The left atrium was not dilated.  Her most recent hemoglobin A1c was 7.4% and her most recent LDL was 159 on labs performed she also has a pretty good HDL of 70 and triglycerides.  She has normal renal she was started on treatment with pravastatin 40 mg daily.  More recently she has restarted treatment with Zetia 10 mg daily.  She has a history of myalgia with other statins.  She remembers taking atorvastatin.  She is not sure if she's ever taken rosuvastatin  Past Medical History:  Diagnosis Date   Degenerative joint disease    Diabetes mellitus    Headache    Hyperlipidemia    Hypertension    Sleep apnea    stopped using it    Past Surgical History:  Procedure Laterality Date   ABDOMINAL HYSTERECTOMY  1976   BACK SURGERY     KNEE ARTHROSCOPY     left x 2   LUMBAR LAMINECTOMY  1989    Current Medications: Current Meds  Medication Sig   albuterol (VENTOLIN  HFA) 108 (90 Base) MCG/ACT inhaler Inhale 2 puffs into the lungs every 6 (six) hours as needed for wheezing or shortness of breath.   amLODipine (NORVASC) 5 MG tablet TAKE 1 TABLET BY MOUTH EVERY DAY (Patient taking differently: Take 5 mg by mouth daily.)   aspirin EC 325 MG EC tablet Take 1 tablet (325 mg total) by mouth daily.   Blood Glucose Monitoring Suppl (ONE TOUCH ULTRA 2) w/Device KIT Apply 1 Device topically in the morning and at bedtime. E11.9   Cholecalciferol (VITAMIN D) 50 MCG (2000 UT) tablet Take 2,000 Units by mouth daily.   clopidogrel (PLAVIX) 75 MG tablet Take 1  tablet (75 mg total) by mouth daily.   ezetimibe (ZETIA) 10 MG tablet TAKE 1 TABLET BY MOUTH EVERY DAY   famotidine (PEPCID) 20 MG tablet Take 1 tablet (20 mg total) by mouth 2 (two) times daily.   gabapentin (NEURONTIN) 400 MG capsule TAKE 1 CAPSULE BY MOUTH THREE TIMES DAILY (Patient taking differently: Take 400 mg by mouth 2 (two) times daily.)   glucose blood (ONETOUCH ULTRA) test strip USE AS DIRECTED TWICE DAILY   Insulin Pen Needle 32G X 4 MM MISC Use to inject  lantus once daily (Patient taking differently: Use to inject  lantus once daily)   LANTUS SOLOSTAR 100 UNIT/ML Solostar Pen INJECT 55 UNITS INTO THE SKIN DAILY.   losartan (COZAAR) 100 MG tablet Take 0.5 tablets (50 mg total) by mouth daily.   pravastatin (PRAVACHOL) 40 MG tablet Take 1 tablet (40 mg total) by mouth daily at 6 PM.   quinapril (ACCUPRIL) 40 MG tablet Take 40 mg by mouth daily.   tiZANidine (ZANAFLEX) 4 MG tablet TAKE 1 TABLET BY MOUTH EVERY 8 HOURS AS NEEDED FOR MUSCLE SPASM (Patient taking differently: Take 4 mg by mouth every 8 (eight) hours as needed for muscle spasms.)   triamcinolone (NASACORT) 55 MCG/ACT AERO nasal inhaler Place 2 sprays into the nose daily. (Patient taking differently: Place 2 sprays into the nose daily as needed (for allergies).)   vitamin B-12 (CYANOCOBALAMIN) 1000 MCG tablet Take 1 tablet by mouth once daily   zolpidem (AMBIEN) 10 MG tablet Take 1 tablet (10 mg total) by mouth at bedtime as needed. (Patient taking differently: Take 10 mg by mouth at bedtime as needed for sleep.)     Allergies:   Contrast media [iodinated diagnostic agents], Aspirin, Gadolinium derivatives, Metformin and related, Shellfish allergy, Tramadol, Penicillins, and Statins   Social History   Socioeconomic History   Marital status: Single    Spouse name: Not on file   Number of children: 2   Years of education: 8   Highest education level: 8th grade  Occupational History   Occupation: Disability    Comment:  retired day Runner, broadcasting/film/video  Tobacco Use   Smoking status: Former    Packs/day: 0.50    Years: 15.00    Pack years: 7.50    Types: Cigarettes    Quit date: 08/02/1990    Years since quitting: 30.3   Smokeless tobacco: Never  Vaping Use   Vaping Use: Never used  Substance and Sexual Activity   Alcohol use: No    Alcohol/week: 0.0 standard drinks   Drug use: No   Sexual activity: Never  Other Topics Concern   Not on file  Social History Narrative   Born in [place], [place].   Completed the 8th grade   Fun: Walk, crochet, puzzles   Diet - Variety of foods, does  not eat breakfast   2 children - 44 and 45    6 grandchildren - 7 great grandchildren.    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   Investment banker, operational of Sales executive: Low Risk    Difficulty of Paying Living Expenses: Not hard at all  Food Insecurity: No Food Insecurity   Worried About Charity fundraiser in the Last Year: Never true   Arboriculturist in the Last Year: Never true  Transportation Needs: No Transportation Needs   Lack of Transportation (Medical): No   Lack of Transportation (Non-Medical): No  Physical Activity: Sufficiently Active   Days of Exercise per Week: 5 days   Minutes of Exercise per Session: 30 min  Stress: No Stress Concern Present   Feeling of Stress : Not at all  Social Connections: Moderately Integrated   Frequency of Communication with Friends and Family: More than three times a week   Frequency of Social Gatherings with Friends and Family: Once a week   Attends Religious Services: More than 4 times per year   Active Member of [employer] or Organizations: No   Attends Music therapist: More than 4 times per year   Marital Status: Widowed     Family History: The patient's family history includes Diabetes in her mother and sister; Heart disease in her brother and mother; Hypertension in her brother and mother; Kidney disease in her mother; Stroke  in her brother; Thyroid disease in her mother. There is no history of Colon cancer, Colon polyps, Rectal cancer, or Stomach cancer.  ROS:   Please see the history of present illness.     All other systems reviewed and are negative.  EKGs/Labs/Other Studies Reviewed:    The following studies were reviewed today: MRI/MRA 10/06/2020 IMPRESSION: 1. Scattered areas of acute/subacute cortical infarction involving the high left parietal lobe, superior aspect of the left occipital lobe, and minimal involvement of the right frontal lobe. 2. No other acute intracranial abnormality. 3. Calcified meningioma along the medial aspect of the left occipital lobe. 4. Mild distal small vessel disease without a significant proximal stenosis, aneurysm, or branch vessel occlusion within the Circle of Willis. 5. Signal loss in the proximal inferior left M2 segment is felt to be artifactual. 6. Fetal type posterior cerebral arteries bilaterally. 7. Signal loss in the distal PCA branch vessels is felt to be artifactual.  ECHO 10/07/2020   1. Left ventricular ejection fraction, by estimation, is 65 to 70%. The  left ventricle has normal function. The left ventricle has no regional  wall motion abnormalities. There is moderate left ventricular hypertrophy.  Left ventricular diastolic  parameters are indeterminate.   2. Right ventricular systolic function is normal. The right ventricular  size is normal. Tricuspid regurgitation signal is inadequate for assessing  PA pressure.   3. A small pericardial effusion is present. The pericardial effusion is  posterior to the left ventricle.   4. The mitral valve is grossly normal. Trivial mitral valve  regurgitation.   5. The aortic valve is tricuspid. Aortic valve regurgitation is not  visualized. Mild to moderate aortic valve sclerosis/calcification is  present, without any evidence of aortic stenosis. Aortic valve mean  gradient measures 2.0 mmHg.   6. The  inferior vena cava is normal in size with greater than 50%  respiratory variability, suggesting right atrial pressure of 3 mmHg.   Event Monitor 10/13/2020  The dominant rhythm is normal sinus, with  normal circadian rhythm. There is a single 8 beat run of wide-complex tachycardia, probably slow ventricular tachycardia., There are very rare PACs and PVCs. There is no evidence of atrial fibrillation.   Mostly normal findings on event monitor, with the exception of a single 8 beat run of wide-complex tachycardia.  Specifically there is no evidence of atrial fibrillation.    EKG:  EKG is  ordered today.  The ekg ordered today demonstrates normal sinus rhythm left anterior fascicular block  Recent Labs: 10/07/2020: ALT 24; BUN 8; Creatinine, Ser 0.68; Hemoglobin 13.2; Platelets 220; Potassium 3.9; Sodium 140; TSH 1.875  Recent Lipid Panel    Component Value Date/Time   CHOL 236 (H) 10/07/2020 0456   TRIG 37 10/07/2020 0456   HDL 70 10/07/2020 0456   CHOLHDL 3.4 10/07/2020 0456   VLDL 7 10/07/2020 0456   LDLCALC 159 (H) 10/07/2020 0456     Risk Assessment/Calculations:           Physical Exam:    VS:  BP 138/66 (BP Location: Left Arm, Patient Position: Sitting)   Pulse 94   Ht $R'5\' 2"'ZE$  (1.575 m)   Wt 158 lb 9.6 oz (71.9 kg)   SpO2 99%   BMI 29.01 kg/m     Wt Readings from Last 3 Encounters:  12/06/20 158 lb 9.6 oz (71.9 kg)  11/16/20 157 lb (71.2 kg)  10/31/20 159 lb (72.1 kg)     GEN:  Well nourished, well developed in no acute distress HEENT: Normal NECK: No JVD; No carotid bruits LYMPHATICS: No lymphadenopathy CARDIAC: RRR, no murmurs, rubs, gallops RESPIRATORY:  Clear to auscultation without rales, wheezing or rhonchi  ABDOMEN: Soft, non-tender, non-distended MUSCULOSKELETAL:  No edema; No deformity  SKIN: Warm and dry NEUROLOGIC:  Alert and oriented x 3 PSYCHIATRIC:  Normal affect   ASSESSMENT:    1. NSVT (nonsustained ventricular tachycardia) ([place])   2.  Cryptogenic stroke ([place])   3. OSA (obstructive sleep apnea)   4. Essential hypertension   5. Type 2 diabetes mellitus with hyperglycemia, with long-term current use of insulin (HCC)   6. Hypercholesterolemia    PLAN:    In order of problems listed above:  NSVT: This episode was asymptomatic and isolated during 30 days of arrhythmia monitoring.  She has no history of syncope or clear evidence of CAD.  It is of doubtful clinical significance.  In the absence of significant structural heart disease, I do not think it requires specific treatment. CVA: I wonder whether the 30-day event monitor is sufficient monitoring for possible atrial fibrillation.  I have sent some messages to Dr. Erlinda Hong, since the MRI study suggested that she might have had bilateral ischemic injuries.  My understanding is that he does not believe that the right parietal abnormalities were really meaningful and he thinks that she had a primary vascular cause for her stroke.  He would like to repeat the MRA (she is allergic to iodinated contrast and cannot have a CT angiogram) to see if the focal vascular abnormality in the middle cerebral artery distribution was truly an artifact or real stenosis.  If the MRA does not show vascular abnormality, I would recommend going ahead with an implantable loop recorder.  I have already discussed this with the patient, obtained her consent and tentatively scheduled it for about 3 weeks from now, but we will cancel the procedure if Dr. Erlinda Hong finds a satisfactory alternative etiology for her stroke. OSA: Encouraged her to keep the appointment with Dr. Brett [name].  She would benefit from continued treatment with CPAP.  This can lessen the likelihood of atrial fibrillation in the future. HTN: Satisfactory control with current medications. DM: Ideally would like her hemoglobin A1c less than 7%.  I wonder whether she would benefit from a newer agent such as an SGLT2 inhibitor or GLP-1 agonist instead of insulin.   These agents would reduce her risk of future cardiovascular complications. HLP: Target LDL less than 70.  I am not sure the combination of pravastatin and ezetimibe will be sufficient to achieve what is a greater than 50% need for reduction.  We will wait for a couple of months, repeat the lipid profile, before we decide whether or not we need to switch to a more potent agent (if she tolerates pravastatin she might also tolerate rosuvastatin). Leg pain: Normal ABIs performed recently.  Suspect this is neurogenic claudication from her lumbar spine disease.  Probably has neurogenic claudication from her lumbar spine disease.        Medication Adjustments/Labs and Tests Ordered: Current medicines are reviewed at length with the patient today.  Concerns regarding medicines are outlined above.  Orders Placed This Encounter  Procedures   Lipid panel   EKG 12-Lead   No orders of the defined types were placed in this encounter.   Patient Instructions  Medication Instructions: No changes  * If you need a refill on your cardiac medications before your next appointment, please call your pharmacy.   Labwork: Your provider would like for you to return in 2 months to have the following labs drawn: fasting Lipid. You do not need an appointment for the lab. Once in our office lobby there is a podium where you can sign in and ring the doorbell to alert [place] that you are here. The lab is open from 8:00 am to 4:30 pm; closed for lunch from 12:45pm-1:45pm.  If you have labs (blood work) drawn today and your tests are completely normal, you will receive your results only by: [place] (if you have MyChart) OR A paper copy in the mail If you have any lab test that is abnormal or we need to change your treatment, we will call you to review the results.  Testing/Procedures: Your physician has recommended that you have a loop recorder implant. Please follow the instructions below, located under the  special instructions section.  Follow-Up: Your physician recommends that you schedule a wound check appointment 10-14 days, after your procedure on 12/25/20, with the device clinic.  Follow-Up: At [facility], you and your health needs are our priority.  As part of our continuing mission to provide you with exceptional heart care, we have created designated Provider Care Teams.  These Care Teams include your primary Cardiologist (physician) and Advanced Practice Providers (APPs -  Physician Assistants and Nurse Practitioners) who all work together to provide you with the care you need, when you need it.  Your next appointment:   3 month(s)  The format for your next appointment:   In Person  Provider:   Sanda Klein, MD  Special Instructions:   [place] [facility] [address], [address]  [place], [place] [address]  Phone: [phone] Fax: [phone]   You are scheduled for a Loop Recorder Implant on 12:20 pm on 12/25/20 with Dr. Sallyanne Kuster. This will take place at [address], [address].    Please arrive at your appointment 15-20 minutes early.   You do not need to be fasting.  The procedure is performed with local anesthesia. You will not receive sedatives nor will an IV be placed.   Wash your chest and neck with the surgical soap the evening before and the morning of your procedure. Please following the washing instructions provided.   As with all surgical implants, there is a small risk of infection. If an infection occurs, the device will be removed. To help reduce the risk, please use the surgical scrub provided. Additional antiseptic precautions will be taken at the time of the procedure.   Please bring your insurance cards.   *Please note that scheduled loop recorder implants may need to be rescheduled if Dr. Sallyanne Kuster has a procedure urgently added on at the hospital   Preparing for the Procedure  Before the procedure, you can  play an important role. Because skin is not sterile, your skin needs to be as free of germs as possible. You can reduce the number of germs on your skin by washing with CHG (chlorhexidine gluconate) Soap before the procedure. CHG is an antiseptic cleaner which kills germs and bonds with the skin to continue killing germs even after washing.  Please do not use if you have an allergy to CHG or antibacterial soaps. If your skin becomes reddened/irritated, STOP using the CHG.  DO NOT SHAVE (including legs and underarms) for at least 48 hours prior to first CHG shower. It is OK to shave your face.  Please follow these instructions carefully: Shower the night before the procedure and the morning of with CHG Soap. If you chose to wash your hair, wash your hair first as usual with your normal shampoo/conditioner. After you shampoo/condition, rinse you hair and body thoroughly to remove shampoo/conditioner. Use CHG as you would any other liquid soap. You can apply CHG directly to the skin and wash gently with a loofah or a clean washcloth. Apply the CHG Soap to your body ONLY FROM THE NECK DOWN. Do not use on open wounds or open sores. Avoid contact with your eyes, ears, mouth, and genitals (private parts).  Wash thoroughly, paying special attention to the area where your surgery will be performed. Thoroughly rinse your body with warm water from the neck down. DO NOT shower/wash with your normal soap after using and rinsing off the CHG Soap. Pat yourself dry with a clean towel. Wear clean pajamas to bed. Place clean sheets on your bed the night of your first shower and do not sleep with pets..  Day of Surgery: Shower with the CHG Soap following the instructions listed above. DO NOT apply deodorants or lotions.    Signed, Sanda Klein, MD  12/08/2020 3:17 PM    [facility]

## 2020-12-07 ENCOUNTER — Other Ambulatory Visit: Payer: Self-pay | Admitting: *Deleted

## 2020-12-07 NOTE — Patient Outreach (Signed)
[facility] Care Management  12/07/2020  Rosan Calbert Apr 14, 1951 [id number]   [facility] Unsuccessful outreach Ms EMMANUELA GHAZI was referred to [facility] on 10/16/20 for EMMI stroke referral for red alert reason: Scheduled a follow-up appointment? I Don't Know (after a call to her on Saturday 10/14/20 at 1400) and an EMMI stroke referral on 10/27/20 for 10/26/20 Thursday day #13 outreach referral reason: had follow up appointment? No   Insurance blue cross and blue shield medicare [facility] admission x 1 [facility] admission 10/06/20-10/08/20 for left MCA infarct -high grade stenosis, hypertension, diabetes, obstructive sleep disorder, hyperlipidemia    Outreach attempt to the home number at [phone] No answer. [facility] RN CM left HIPAA [facility] Portability and Accountability Act) compliant voicemail message along with CM's contact info.   Plan: [facility] RN CM scheduled this patient for another call attempt within 10-14 business days Unsuccessful outreach on 12/07/20   Tyreisha Ungar [name], RN, BSN, [place] Coordinator Office number [phone] Mobile number [phone]  Main [facility] number [phone] Fax number [phone]

## 2020-12-07 NOTE — Patient Outreach (Signed)
[facility]) Care Management  12/07/2020  Kiyana Vazguez Oct 03, 1950 [id number]    [facility] follow up outreach to [facility] stroke referred/complex care patient with case closure   Ms ANELISSE JACOBSON was referred to [facility] on 10/16/20 for EMMI stroke referral for red alert reason: Scheduled a follow-up appointment? I Don't Know (after a call to her on Saturday 10/14/20 at 1400) and an EMMI stroke referral on 10/27/20 for 10/26/20 Thursday day #13 outreach referral reason: had follow up appointment? No   Insurance blue cross and blue shield medicare [facility] admission x 1 Last [place] admission 10/06/20-10/08/20 for left MCA infarct -high grade stenosis, hypertension, diabetes, obstructive sleep disorder, hyperlipidemia   Patient is able to verify HIPAA ([place] and [place]) identifiers Reviewed and addressed the purpose of the follow up call with the patient   Consent: [facility] ([place]) RN CM reviewed [facility] services with patient. Patient gave verbal consent for services  Assessment Doing well but still with reported residual symptoms from her stroke that she has reported to cardiology and neurology Not dropping items any further, still using a cane She continues to monitor these symptoms for new worsening issues She is now going to her laundry room, grocery shopping and is driving some  Vision  After follow up with her specialists she is going to make appointment with her ophthalmologist Gershon Crane)  Finished with outpatient therapy Saw cardiology 12/06/20 She is aware of a future implantable loop recorder on 12/25/20 as recommended by her cardiologist on 12/06/20   Allowed her to ventilate her feelings about scam calls and mail  [facility] progression discussed with her  She prefers to [facility] complex closure with the option to call to her MDs, RN CM prn, 24 RN call center prn vs referral to [facility] disease management/health coach  Patient Active Problem List    Diagnosis Date Noted   History of completed stroke 10/31/2020   Stroke ([place]) 10/06/2020   Lipoma 08/26/2020   Asthma 08/26/2020   Bilateral leg pain 08/25/2020   Encounter for well adult exam with abnormal findings 05/06/2020   Allergic rhinitis 05/06/2020   Statin myopathy 05/02/2020   Left shoulder pain 11/01/2019   Chronic low back pain 05/03/2019   B12 deficiency 05/03/2019   Left leg swelling 11/16/2018   Left knee pain 11/16/2018   Low back pain 05/14/2018   Abnormal SPEP 11/18/2017   Sternoclavicular joint pain, left 08/19/2017   Cervical radiculopathy at C8 05/15/2017   Pre-ulcerative corn or callous 03/27/2017   Vitamin D deficiency 03/27/2017   Elevated total protein 03/27/2017   Bilateral shoulder pain 03/27/2017   Medicare annual wellness visit, subsequent 03/06/2016   Spondylolisthesis at L4-L5 level 10/06/2015   Acute frontal sinusitis 07/14/2015   Sleep disturbance 07/14/2015   Knee pain, left 11/10/2014   Sprain of ankle 04/11/2014   GERD (gastroesophageal reflux disease) 03/07/2014   Prolapse of vaginal wall with midline cystocele, grade 1 08/23/2013   Vaginal dryness 05/14/2012   Dry mouth 05/13/2012   Depression and insomnia 07/19/2010   Multilevel degenerative disc disease of cervical and lumbar spine  01/16/2008   Essential hypertension 01/01/2008   Diabetes ([place]) 03/18/2007   Hyperlipidemia 02/13/2007   SLEEP APNEA 02/13/2007   Current Outpatient Medications on File Prior to Visit  Medication Sig Dispense Refill   albuterol (VENTOLIN HFA) 108 (90 Base) MCG/ACT inhaler Inhale 2 puffs into the lungs every 6 (six) hours as needed for wheezing or shortness of breath.  8 g 5   amLODipine (NORVASC) 5 MG tablet TAKE 1 TABLET BY MOUTH EVERY DAY (Patient taking differently: Take 5 mg by mouth daily.) 90 tablet 3   aspirin EC 325 MG EC tablet Take 1 tablet (325 mg total) by mouth daily. 30 tablet 0   Blood Glucose Monitoring Suppl (ONE TOUCH ULTRA 2) w/Device  KIT Apply 1 Device topically in the morning and at bedtime. E11.9 1 kit 0   Cholecalciferol (VITAMIN D) 50 MCG (2000 UT) tablet Take 2,000 Units by mouth daily.     clopidogrel (PLAVIX) 75 MG tablet Take 1 tablet (75 mg total) by mouth daily. 90 tablet 0   conjugated estrogens (PREMARIN) vaginal cream Place 1 Applicatorful vaginally daily. 3 days a week (Patient not taking: Reported on 12/06/2020)     EPINEPHrine 0.3 mg/0.3 mL IJ SOAJ injection Inject 0.3 mLs (0.3 mg total) into the muscle as needed for anaphylaxis. (Patient not taking: Reported on 12/06/2020) 2 each 1   estradiol (ESTRACE) 0.1 MG/GM vaginal cream Place vaginally. (Patient not taking: Reported on 12/06/2020)     ezetimibe (ZETIA) 10 MG tablet TAKE 1 TABLET BY MOUTH EVERY DAY 90 tablet 3   famotidine (PEPCID) 20 MG tablet Take 1 tablet (20 mg total) by mouth 2 (two) times daily. 180 tablet 3   gabapentin (NEURONTIN) 400 MG capsule TAKE 1 CAPSULE BY MOUTH THREE TIMES DAILY (Patient taking differently: Take 400 mg by mouth 2 (two) times daily.) 270 capsule 1   glucose blood (ONETOUCH ULTRA) test strip USE AS DIRECTED TWICE DAILY 200 each 0   HYDROcodone-acetaminophen (NORCO/VICODIN) 5-325 MG tablet Take 1 tablet by mouth every 6 (six) hours as needed. (Patient not taking: Reported on 12/06/2020) 15 tablet 0   Insulin Pen Needle 32G X 4 MM MISC Use to inject  lantus once daily (Patient taking differently: Use to inject  lantus once daily) 100 each 4   LANTUS SOLOSTAR 100 UNIT/ML Solostar Pen INJECT 55 UNITS INTO THE SKIN DAILY. 100 mL 2   losartan (COZAAR) 100 MG tablet Take 0.5 tablets (50 mg total) by mouth daily. 90 tablet 3   pravastatin (PRAVACHOL) 40 MG tablet Take 1 tablet (40 mg total) by mouth daily at 6 PM. 30 tablet 5   quinapril (ACCUPRIL) 40 MG tablet Take 40 mg by mouth daily.     tiZANidine (ZANAFLEX) 4 MG tablet TAKE 1 TABLET BY MOUTH EVERY 8 HOURS AS NEEDED FOR MUSCLE SPASM (Patient taking differently: Take 4 mg by mouth  every 8 (eight) hours as needed for muscle spasms.) 90 tablet 5   traMADol (ULTRAM) 50 MG tablet Take 1 tablet (50 mg total) by mouth every 6 (six) hours as needed. (Patient not taking: No sig reported) 30 tablet 0   triamcinolone (NASACORT) 55 MCG/ACT AERO nasal inhaler Place 2 sprays into the nose daily. (Patient taking differently: Place 2 sprays into the nose daily as needed (for allergies).) 3 each 3   vitamin B-12 (CYANOCOBALAMIN) 1000 MCG tablet Take 1 tablet by mouth once daily 90 tablet 0   zolpidem (AMBIEN) 10 MG tablet Take 1 tablet (10 mg total) by mouth at bedtime as needed. (Patient taking differently: Take 10 mg by mouth at bedtime as needed for sleep.) 90 tablet 1   No current facility-administered medications on file prior to visit.    Plans Patient agrees to complex care case closure with the option to call to her MDs, RN CM prn, 24 RN call center prn Pt encouraged  to return a call to [place] CM and 24 hour nurse prn  Goals Addressed               This Visit's Progress     Patient Stated     COMPLETED: [facility]) Keep or Improve My Strength-Stroke (pt-stated)   On track     Timeframe:  Long-Range Goal Priority:  High Start Date:         10/16/20                    Expected End Date:     12/22/20                  Follow Up Date 12/07/20  Barriers: Knowledge  - attend 90 percent of physical therapy appointments - eat healthy to increase strength - increase activity or exercise time a little every week - join an activity or exercise group in community - plan activity for times when energy is the highest  -Premedicate prior to therapy sessions and use ice after sessions     Notes:  12/07/20 finished rehab, doing most of ADLs & IADLs, much improved strength goal met  11/15/20 continues outpatient rehab >90%, strength improving but notes increased pain of left shoulder, left knee which are from pre stroke issues- will begin to premedicate prior to therapy sessions and use  ice after sessions -appetite is good 11/01/20  getting better daily with less right sided weakness; less episodes of dropping items with the right hand; reports completing a lot of errands and home tasks today to include increase walking and laundry; uses a cane; continues neuro rehab sessions but report  "over did it" in therapy - left shoulder and knee pain        Antaniya Venuti [name], RN, BSN, [place] Coordinator Office number [phone] [facility] number [phone] Fax number [phone]

## 2020-12-11 NOTE — Addendum Note (Signed)
Addended by: Frann Rider L on: 12/11/2020 05:15 PM   Modules accepted: Orders

## 2020-12-11 NOTE — Telephone Encounter (Signed)
No problem! MRA head order placed for reevaluation. Thank you!

## 2020-12-12 ENCOUNTER — Telehealth: Payer: Self-pay | Admitting: Internal Medicine

## 2020-12-12 ENCOUNTER — Telehealth: Payer: Self-pay | Admitting: Adult Health

## 2020-12-12 MED ORDER — MELOXICAM 15 MG PO TABS: 15.0000 mg | ORAL_TABLET | Freq: Every day | ORAL | 5 refills | Status: DC | PRN

## 2020-12-12 NOTE — Telephone Encounter (Signed)
   Patient calling to report she can not take Tramadol that was prescribed in June. She is requesting alternative medication for hip and back pain  Pharmacy [id number] [place], [place]

## 2020-12-12 NOTE — Telephone Encounter (Signed)
Ok I sent mobic a (nsaid) for daily use as needed for pain as well to [place] have other to offer as I dont treat chronic pain with vicodin or stronger mediations  I can refer to pain management if she likes

## 2020-12-12 NOTE — Telephone Encounter (Signed)
Called and spoke with patient today. She will try nsaid first and call if not any better.

## 2020-12-12 NOTE — Telephone Encounter (Signed)
MRA head wo contrast BC[employer] Medicare auth: NPR  Sent to GI for scheduling

## 2020-12-17 ENCOUNTER — Other Ambulatory Visit: Payer: Self-pay

## 2020-12-17 ENCOUNTER — Ambulatory Visit
Admission: RE | Admit: 2020-12-17 | Discharge: 2020-12-17 | Disposition: A | Discharge status: Home / Self Care | Payer: Medicare Other | Source: Ambulatory Visit | Attending: Adult Health | Admitting: Adult Health

## 2020-12-17 DIAGNOSIS — I679 Cerebrovascular disease, unspecified: Secondary | ICD-10-CM | POA: Diagnosis not present

## 2020-12-17 DIAGNOSIS — I639 Cerebral infarction, unspecified: Secondary | ICD-10-CM | POA: Diagnosis not present

## 2020-12-19 ENCOUNTER — Telehealth: Payer: Self-pay | Admitting: *Deleted

## 2020-12-19 DIAGNOSIS — I739 Peripheral vascular disease, unspecified: Secondary | ICD-10-CM | POA: Diagnosis not present

## 2020-12-19 DIAGNOSIS — I639 Cerebral infarction, unspecified: Secondary | ICD-10-CM | POA: Diagnosis not present

## 2020-12-19 LAB — LIPID PANEL
Chol/HDL Ratio: 2.4 ratio (ref 0.0–4.4)
Cholesterol, Total: 163 mg/dL (ref 100–199)
HDL: 68 mg/dL (ref >39)
LDL Chol Calc (NIH): 85 mg/dL (ref 0–99)
Triglycerides: 47 mg/dL (ref 0–149)
VLDL Cholesterol Cal: 10 mg/dL (ref 5–40)

## 2020-12-19 NOTE — Telephone Encounter (Signed)
The patient came into the office today as a walk in inquiring about the results of her MRI and her loop procedure instructions.  She has been advised to reach out to the ordering provider for those results.  Instructions have been given for the loop recorder implant. She has agreed to proceed. She would like to know if she needs to hold the aspirin.

## 2020-12-21 ENCOUNTER — Telehealth: Payer: Self-pay

## 2020-12-21 NOTE — Telephone Encounter (Signed)
Repeat MRA head completed which showed normal appearance of left inferior M2 segment.  May be beneficial to proceed with recorder placement at this time to further evaluate for possible A. fib and stroke etiology.

## 2020-12-21 NOTE — Telephone Encounter (Signed)
Spoke with the patient. We will move forward with the loop recorder implant. She does not need to hold any medicines. She has been advised to call back if she has any questions.

## 2020-12-21 NOTE — Telephone Encounter (Signed)
Perfect!  Thank you so much.

## 2020-12-21 NOTE — Telephone Encounter (Signed)
-----   Message from Frann Rider, NP sent at 12/21/2020 12:50 PM EDT ----- Please advise patient that recent imaging showed normal appearance of prior reported area of narrowing -please advise her to follow-up with her cardiologist to further discuss loop recorder placement at this time.  Thank you.

## 2020-12-21 NOTE — Telephone Encounter (Signed)
Contacted pt to inform her that recent imaging showed normal appearance of prior reported area of narrowing. Also advised her to follow-up with her cardiologist to further discuss loop recorder placement. She stated placement will be done on the first. Advised to call the office with questions or concerns as she had none at this time.

## 2020-12-25 ENCOUNTER — Ambulatory Visit (INDEPENDENT_AMBULATORY_CARE_PROVIDER_SITE_OTHER): Payer: Medicare Other | Admitting: Cardiovascular Disease

## 2020-12-25 ENCOUNTER — Other Ambulatory Visit: Payer: Self-pay

## 2020-12-25 DIAGNOSIS — Z95818 Presence of other cardiac implants and grafts: Secondary | ICD-10-CM

## 2020-12-25 DIAGNOSIS — I639 Cerebral infarction, unspecified: Secondary | ICD-10-CM | POA: Diagnosis not present

## 2020-12-25 DIAGNOSIS — E7800 Pure hypercholesterolemia, unspecified: Secondary | ICD-10-CM

## 2020-12-25 MED ORDER — PRAVASTATIN SODIUM 40 MG PO TABS
40.0000 mg | ORAL_TABLET | Freq: Every day | ORAL | 3 refills | Status: DC
Start: 2020-12-25 — End: 2021-07-26

## 2020-12-25 MED ORDER — EZETIMIBE 10 MG PO TABS: 10.0000 mg | ORAL_TABLET | Freq: Every day | ORAL | 3 refills | Status: DC

## 2020-12-25 MED ORDER — LIDOCAINE-EPINEPHRINE 1 %-1:100000 IJ SOLN: 10.0000 mL | Freq: Once | INTRAMUSCULAR | Status: DC

## 2020-12-25 NOTE — Progress Notes (Signed)
LOOP RECORDER IMPLANT   Procedure report  Procedure performed:  Loop recorder implantation   Reason for procedure:  1. Cryptogenic stroke Procedure performed by:  Sanda Klein, MD  Complications:  None  Estimated blood loss:  <5 mL  Medications administered during procedure:  Lidocaine 1% with 1/100,000 epinephrine 10 mL locally Device details:  [employer] Reveal Linq model number [id number], serial number [id number]   Procedure details:  After the risks and benefits of the procedure were discussed the patient provided informed consent. The patient was prepped and draped in usual sterile fashion. Local anesthesia was administered to an area 2 cm to the left of the sternum in the 4th intercostal space. A cutaneous incision was made using the incision tool. The introducer was then used to create a subcutaneous tunnel and carefully deploy the device. Local pressure was held to ensure hemostasis.  The incision was closed with SteriStrips and a sterile dressing was applied.  R waves 0.45 mV.  Sanda Klein, MD, [facility] [phone] office [phone] pager 12/25/2020 12:43 PM

## 2020-12-25 NOTE — Patient Instructions (Signed)
Discharge Instructions for  Loop Recorder Implant    Follow up: Keep your wound check appointment on 01/08/21 at 3:30 with Dr. Sallyanne Kuster.  This will be a MyChart video visit  If you have any questions or concerns, please call the office at [phone].  ACTIVITY No restrictions. DO wear your seatbelt, even if it crosses over the site.   WOUND CARE Keep the wound area clean and dry.  Remove the dressing the day after (usually 24 hours after the procedure). DO NOT SUBMERGE UNDER WATER UNTIL FULLY HEALED (no tub baths, hot tubs, swimming pools, etc.).  You  may shower or take a sponge bath after the dressing is removed. DO NOT SOAK the area and do not allow the shower to directly spray on the site. If you have tape/steri-strips on your wound, these will fall off; do not pull them off prematurely.   No bandage is needed on the site.  DO  NOT apply any creams, oils, or ointments to the wound area. If you notice any drainage or discharge from the wound, any swelling, excessive redness or bruising at the site, or if you develop a fever > 101? F, call the office at once at [phone].

## 2021-01-05 ENCOUNTER — Other Ambulatory Visit: Payer: Self-pay

## 2021-01-05 NOTE — Patient Outreach (Signed)
[facility]) Care Management  01/05/2021  Tiffany [name] Jan 17, 1951 [id number]   Telephone outreach to patient to obtain mRS was successfully completed. MRS= 0   [place] Care Management Assistant

## 2021-01-08 ENCOUNTER — Telehealth (INDEPENDENT_AMBULATORY_CARE_PROVIDER_SITE_OTHER): Payer: Medicare Other | Admitting: Cardiovascular Disease

## 2021-01-08 DIAGNOSIS — I639 Cerebral infarction, unspecified: Secondary | ICD-10-CM

## 2021-01-08 NOTE — Progress Notes (Signed)
Site is well healed. A small ecchymosis is present. No fever, chills, drainage, redness or ongoing bleeding. Using smart phone as remote download device.

## 2021-01-09 ENCOUNTER — Telehealth: Payer: Self-pay | Admitting: Cardiovascular Disease

## 2021-01-09 MED ORDER — CLOPIDOGREL BISULFATE 75 MG PO TABS: 75.0000 mg | ORAL_TABLET | Freq: Every day | ORAL | 3 refills | Status: DC

## 2021-01-09 NOTE — Telephone Encounter (Signed)
*  STAT* If patient is at the pharmacy, call can be transferred to refill team.   1. Which medications need to be refilled? (please list name of each medication and dose if known)  clopidogrel (PLAVIX) 75 MG tablet  2. Which pharmacy/location (including street and city if local pharmacy) is medication to be sent to? [id number] [place], [place] - [address]  3. Do they need a 30 day or 90 day supply?  90 day supply

## 2021-01-09 NOTE — Telephone Encounter (Signed)
Patient following up on loop recorder  Did not see mention of BP cuff in notes   Will route to primary nurse for assistance

## 2021-01-09 NOTE — Telephone Encounter (Signed)
Patient is following up regarding an order for a bedside ILR monitor and BP cuff.

## 2021-01-09 NOTE — Telephone Encounter (Signed)
Left a message for the patient that the blood pressure cuff was at the front desk for her and that we would be in touch about the Loop Recorder Monitor.

## 2021-01-11 ENCOUNTER — Other Ambulatory Visit: Payer: Self-pay | Admitting: Internal Medicine

## 2021-01-16 NOTE — Telephone Encounter (Signed)
The patient daughter states the patient has the app on her phone but does not know she has the app. The patient daughter bought her an Iphone 12 and the patient does not know how to work it. They are scared she will delete the app. I explained to them the monitor that compatible with the Linq 22 are on back order since November. We can no longer order the Relay monitors and [employer] is not allowed to order the relay monitor for patients because of the backorder. The best thing is for the patient to have the app on her phone. I also let her know the phone needs to be plugged in by her bedside in order for it to work. The patient daughter Mariann Laster (dpr) verbalized understanding and will try to have the patient to keep the monitor by her bedside.  The patient app is up to date. The last transmission is 01/16/2021.

## 2021-01-16 NOTE — Telephone Encounter (Signed)
Spoke to the patient. She was able to get her blood pressure cuff and has been recording her pressures.  She stated that she is not able to use the app on her phone for the loop recorder downloads. She will need the transmitter for this. She has been advised that we will be in touch about this.

## 2021-01-17 ENCOUNTER — Encounter: Payer: Self-pay | Admitting: Neurology

## 2021-01-17 ENCOUNTER — Ambulatory Visit (INDEPENDENT_AMBULATORY_CARE_PROVIDER_SITE_OTHER): Payer: Medicare Other | Admitting: Neurology

## 2021-01-17 VITALS — BP 148/81 | HR 82 | Ht 62.0 in | Wt 157.0 lb

## 2021-01-17 DIAGNOSIS — I10 Essential (primary) hypertension: Secondary | ICD-10-CM | POA: Insufficient documentation

## 2021-01-17 DIAGNOSIS — I679 Cerebrovascular disease, unspecified: Secondary | ICD-10-CM | POA: Diagnosis not present

## 2021-01-17 DIAGNOSIS — I639 Cerebral infarction, unspecified: Secondary | ICD-10-CM | POA: Diagnosis not present

## 2021-01-17 DIAGNOSIS — E1165 Type 2 diabetes mellitus with hyperglycemia: Secondary | ICD-10-CM | POA: Insufficient documentation

## 2021-01-17 DIAGNOSIS — I63512 Cerebral infarction due to unspecified occlusion or stenosis of left middle cerebral artery: Secondary | ICD-10-CM

## 2021-01-17 DIAGNOSIS — Z794 Long term (current) use of insulin: Secondary | ICD-10-CM

## 2021-01-17 DIAGNOSIS — G3184 Mild cognitive impairment, so stated: Secondary | ICD-10-CM

## 2021-01-17 NOTE — Progress Notes (Signed)
SLEEP MEDICINE CLINIC    Provider:  Larey Seat, MD  Primary Care Physician:  Biagio Borg, MD [place] [address]     Referring Provider: Frann Rider, [address] [place],  [place] [address]          Chief Complaint according to patient   Patient presents with:     New Patient (Initial Visit)     Frann Rider, NP for OSA. Pt had SS around 10 years ago, was started on cpap and used it until it broke. Pt reports not sleeping well and has been told she stops breathing at night. Has a hard time getting and staying asleep.       HISTORY OF PRESENT ILLNESS:  Tiffany [name] is a 70 y.o. year old 78 or [place] American female patient and seen here as a referral on 69/24/2022 from Frann Rider, NP at [facility]  for a sleep evaluation. .  Chief concern according to patient :  "   I have the pleasure of seeing Tiffany [name] today, a right -handed Black or [place] American female with a possible sleep disorder.  She has a  has a past medical history of CVA , Stroke 10-06-2020, Degenerative joint disease, Diabetes mellitus, Headache, Hyperlipidemia, Hypertension.   Today's date is 17 January 2021 the patient had been seen by Rodena Piety nurse practitioner on [phone] as a 70 year old African-American right-handed female with a history remote history of being diagnosed with sleep apnea she had not been on CPAP.  She had hypertension, hyperlipidemia diabetes mellitus type 2 with neuropathy she had degenerative disc disease and she presented in May with right-sided weakness to the hospital.  Stroke work-up showed a left sided MCA infarction likely due to a high-grade stenosis.  A 30-day cardiac monitor was recommended to rule out A. fib and the patient now has a loop recorder implanted.  Her LDL was 159 and she started on pravastatin.  Her HbA1c was 7.4 as expected for diabetes.  She was discharged home with outpatient physical therapy for rehabilitation  she is now walking with a cane.  Her echocardiogram showed 65 to 70% ejection fraction which is excellent.  She was discharged on aspirin full size 325 mg and Plavix-clopidogrel 75 mg daily for 3 months so is now on aspirin alone. Her cardiologist, Dr. Sallyanne Kuster, recommended for her to stay on double therapy for now.   The patient had the first sleep study over 20 years ago - she stated. .    Sleep relevant medical history: "patient lost eye sight " only driving locally she reports.  She reports sinus problems. Lower back DD, fusion.   Family medical /sleep history: brother , son, parents are/ were on CPAP with OSA.   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 Family status is divorced , with 2 grown children,  grandchildren.  Tobacco use"quit 30 years ago. Marland Kitchen  ETOH use seldomly ,  Caffeine intake in form of Coffee( 1 cup a day) Soda( /) Tea ( /) or energy drinks. Regular exercise in form of walking .        Sleep habits are as follows: The patient's dinner time is between 5-6 PM. The patient goes to bed at 10 PM and continues to struggle to go to sleep for 1-2 hours, once asleep- she wakes from sounds, from  bathroom breaks, the first time at 1-2 AM.   The preferred sleep position is sideways, with the support of 1 pillow.  Dreams are reportedly frequent/vivid.   8-9 AM with variable rise time. The patient wakes up spontaneously/ sometimes with headaches and residual fatigue.  She is snoring.  Naps are taken infrequently.    Review of Systems: Out of a complete 14 system review, the patient complains of only the following symptoms, and all other reviewed systems are negative.:  Fatigue, sleepiness , snoring, fragmented sleep, Insomnia   70 Was addicted to Ambien    How likely are you to doze in the following situations: 0 = not likely, 1 = slight chance, 2 = moderate chance, 3 = high chance   Sitting and  Reading? Watching Television? Sitting inactive in a public place (theater or meeting)? As a passenger in a car for an hour without a break? Lying down in the afternoon when circumstances permit? Sitting and talking to someone? Sitting quietly after lunch without alcohol? In a car, while stopped for a few minutes in traffic?   Total = 1/ 24 points   FSS endorsed at N?/A / 69 points.   Social History   Socioeconomic History   Marital status: Single    Spouse name: Not on file   Number of children: 2   Years of education: 8   Highest education level: 8th grade  Occupational History   Occupation: Disability    Comment: retired day Runner, broadcasting/film/video  Tobacco Use   Smoking status: Former    Packs/day: 0.50    Years: 15.00    Pack years: 7.50    Types: Cigarettes    Quit date: 08/02/1990    Years since quitting: 30.4   Smokeless tobacco: Never  Vaping Use   Vaping Use: Never used  Substance and Sexual Activity   Alcohol use: No    Alcohol/week: 0.0 standard drinks   Drug use: No   Sexual activity: Never  Other Topics Concern   Not on file  Social History Narrative   Born in [place], [place].   Completed the 8th grade   Fun: Walk, crochet, puzzles   Diet - Variety of foods, does not eat breakfast   2 children - 44 and 45    6 grandchildren - 7 great grandchildren.    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   Right handed    Caffeine: 1/2 cup of coffee, rarely    Social Determinants of Radio broadcast assistant Strain: Low Risk    Difficulty of Paying Living Expenses: Not hard at all  Food Insecurity: No Food Insecurity   Worried About Charity fundraiser in the Last Year: Never true   Arboriculturist in the Last Year: Never true  Transportation Needs: No Transportation Needs   Lack of Transportation (Medical): No   Lack of Transportation (Non-Medical): No  Physical Activity: Sufficiently Active   Days of Exercise per Week: 5 days   Minutes of Exercise per  Session: 30 min  Stress: No Stress Concern Present   Feeling of Stress : Not at all  Social Connections: Moderately Integrated   Frequency of Communication with Friends and Family: More than three times a week   Frequency of Social Gatherings with Friends and Family: Once a week   Attends Religious Services: More than 4 times per year   Active Member of [employer] or Organizations: No   Attends Music therapist: More  than 4 times per year   Marital Status: Widowed    Family History  Problem Relation Age of Onset   Hypertension Mother    Diabetes Mother    Thyroid disease Mother    Heart disease Mother    Kidney disease Mother    Diabetes Sister    Hypertension Brother    Stroke Brother    Heart disease Brother    Colon cancer Neg Hx    Colon polyps Neg Hx    Rectal cancer Neg Hx    Stomach cancer Neg Hx     Past Medical History:  Diagnosis Date   Degenerative joint disease    Diabetes mellitus    Headache    Hyperlipidemia    Hypertension    Sleep apnea    stopped using it    Past Surgical History:  Procedure Laterality Date   ABDOMINAL HYSTERECTOMY  1976   BACK SURGERY     KNEE ARTHROSCOPY     left x 2   LUMBAR LAMINECTOMY  1989     Current Outpatient Medications on File Prior to Visit  Medication Sig Dispense Refill   albuterol (VENTOLIN HFA) 108 (90 Base) MCG/ACT inhaler Inhale 2 puffs into the lungs every 6 (six) hours as needed for wheezing or shortness of breath. 8 g 5   amLODipine (NORVASC) 5 MG tablet TAKE 1 TABLET BY MOUTH EVERY DAY (Patient taking differently: Take 5 mg by mouth daily.) 90 tablet 3   aspirin EC 325 MG EC tablet Take 1 tablet (325 mg total) by mouth daily. 30 tablet 0   Blood Glucose Monitoring Suppl (ONE TOUCH ULTRA 2) w/Device KIT Apply 1 Device topically in the morning and at bedtime. E11.9 1 kit 0   Cholecalciferol (VITAMIN D) 50 MCG (2000 UT) tablet Take 2,000 Units by mouth daily.     clopidogrel (PLAVIX) 75 MG tablet  Take 1 tablet (75 mg total) by mouth daily. 90 tablet 3   conjugated estrogens (PREMARIN) vaginal cream Place 1 Applicatorful vaginally daily. 3 days a week     EPINEPHrine 0.3 mg/0.3 mL IJ SOAJ injection Inject 0.3 mLs (0.3 mg total) into the muscle as needed for anaphylaxis. 2 each 1   estradiol (ESTRACE) 0.1 MG/GM vaginal cream Place vaginally.     ezetimibe (ZETIA) 10 MG tablet Take 1 tablet (10 mg total) by mouth daily. 90 tablet 3   famotidine (PEPCID) 20 MG tablet Take 1 tablet (20 mg total) by mouth 2 (two) times daily. 180 tablet 3   gabapentin (NEURONTIN) 400 MG capsule TAKE 1 CAPSULE BY MOUTH THREE TIMES DAILY 270 capsule 1   glucose blood (ONETOUCH ULTRA) test strip USE AS DIRECTED TWICE DAILY 200 each 0   HYDROcodone-acetaminophen (NORCO/VICODIN) 5-325 MG tablet Take 1 tablet by mouth every 6 (six) hours as needed. 15 tablet 0   Insulin Pen Needle 32G X 4 MM MISC Use to inject  lantus once daily (Patient taking differently: Use to inject  lantus once daily) 100 each 4   LANTUS SOLOSTAR 100 UNIT/ML Solostar Pen INJECT 55 UNITS INTO THE SKIN DAILY. 100 mL 2   losartan (COZAAR) 100 MG tablet Take 0.5 tablets (50 mg total) by mouth daily. 90 tablet 3   meloxicam (MOBIC) 15 MG tablet Take 1 tablet (15 mg total) by mouth daily as needed for pain. 30 tablet 5   pravastatin (PRAVACHOL) 40 MG tablet Take 1 tablet (40 mg total) by mouth daily at 6 PM. 90 tablet 3  tiZANidine (ZANAFLEX) 4 MG tablet TAKE 1 TABLET BY MOUTH EVERY 8 HOURS AS NEEDED FOR MUSCLE SPASM (Patient taking differently: Take 4 mg by mouth every 8 (eight) hours as needed for muscle spasms.) 90 tablet 5   traMADol (ULTRAM) 50 MG tablet Take 1 tablet (50 mg total) by mouth every 6 (six) hours as needed. 30 tablet 0   triamcinolone (NASACORT) 55 MCG/ACT AERO nasal inhaler Place 2 sprays into the nose daily. (Patient taking differently: Place 2 sprays into the nose daily as needed (for allergies).) 3 each 3   vitamin B-12  (CYANOCOBALAMIN) 1000 MCG tablet Take 1 tablet by mouth once daily 90 tablet 0   zolpidem (AMBIEN) 10 MG tablet Take 1 tablet (10 mg total) by mouth at bedtime as needed. (Patient taking differently: Take 10 mg by mouth at bedtime as needed for sleep.) 90 tablet 1   Current Facility-Administered Medications on File Prior to Visit  Medication Dose Route Frequency Provider Last Rate Last Admin   lidocaine-EPINEPHrine (XYLOCAINE W/EPI) 1 %-1:100000 (with pres) injection 10 mL  10 mL Infiltration Once Croitoru, Mihai, MD        Allergies  Allergen Reactions   Contrast Media [Iodinated Diagnostic Agents] Anaphylaxis, Nausea And Vomiting, Swelling and Other (See Comments)    Patients throat closes with nausea and vomiting    Aspirin Nausea And Vomiting and Other (See Comments)    Can take EC aspirin   Gadolinium Derivatives Nausea And Vomiting   Metformin And Related Nausea And Vomiting and Other (See Comments)    Severe nausea and vomiting   Shellfish Allergy Nausea And Vomiting   Tramadol Nausea And Vomiting and Other (See Comments)    Migraines   Penicillins Nausea And Vomiting   Statins Other (See Comments)    Myalgias     Physical exam:  Today's Vitals   01/17/21 1437  BP: (!) 148/81  Pulse: 82  Weight: 157 lb (71.2 kg)  Height: 5' 2"  (1.575 m)   Body mass index is 28.72 kg/m.   Wt Readings from Last 3 Encounters:  01/17/21 157 lb (71.2 kg)  12/06/20 158 lb 9.6 oz (71.9 kg)  11/16/20 157 lb (71.2 kg)     Ht Readings from Last 3 Encounters:  01/17/21 5' 2"  (1.575 m)  12/06/20 5' 2"  (1.575 m)  11/16/20 5' 2"  (1.575 m)      General: The patient is awake, alert and appears not in acute distress. The patient is well groomed. Head: Normocephalic, atraumatic. Neck is supple. Mallampati 2,  neck circumference:13 inches . Nasal airflow  patent.  Retrognathia is not  seen.  Dental status:  Cardiovascular:  Regular rate and cardiac rhythm by pulse,  without distended neck  veins. Respiratory: Lungs are clear to auscultation.  Skin:  Without evidence of ankle edema, or rash. Trunk: The patient's posture is erect.   Neurologic exam : The patient is awake and alert, oriented to place and time.   Memory subjective described as intact.  Attention span & concentration ability appears normal.  Speech is non-fluent,  with mild dysarthria, dysphonia  and  aphasia.  Mood and affect are appropriate.   Cranial nerves: no loss of smell or taste reported  Pupils are equal and briskly reactive to light. Funduscopic exam deferred. .  Extraocular movements in vertical and horizontal planes were intact and without nystagmus.  No Diplopia. Visual fields by finger perimetry are intact. Hearing was intact to soft voice and finger rubbing.    Facial  sensation intact to fine touch.  Facial motor strength is symmetric and tongue and uvula move midline.  Neck ROM : rotation, tilt and flexion extension were normal for age and shoulder shrug was symmetrical.    Motor exam:   Symmetric bulk, tone and ROM.   Mildly elevated spastic tone without cog wheeling on the right , right decreased grip strength .   Sensory:  Fine touch and vibration were fel stronger on the right knee an ankle (?) Proprioception tested in the upper extremities was impaired.    Coordination: Rapid alternating movements in the fingers/hands were of reduced l speed.  The Finger-to-nose maneuver with evidence of ataxia, dysmetria on the right.    Gait and station: Patient could rise unassisted from a seated position, walked with a cane for  assistive device.  deferred.  She stumbles with the right foot.  Deep tendon reflexes: in the  upper and lower extremities are symmetric and intact.  Babinski response was upgoing on the right.     After spending a total time of  45  minutes face to face and additional time for physical and neurologic examination, review of laboratory studies,  personal review of  imaging studies, reports and results of other testing and review of referral information / records as far as provided in visit, I have established the following assessments:  LEFT BRAIN STROKE  1) right sided residual hemiparesis after a thrombotic event in the left MCA.  2) impaired steps, walking stairs,  3) poor sleep quality forever, light sleep, Insomnia. Snoring. History of being dependent on Ambien, prescription will not be renewed here     My Plan is to proceed with:  1) attended sleep study to evaluate for recent stroke, HST would be plan B.  2)  No history of atrial fibrillation, normal Echo 3) DM neuropathy.- carpal tunnel at the right.    I would like to thank  and Frann Rider, [address] [place],  [place] [id number] for allowing me to meet with and to take care of this pleasant patient.   In short, Tiffany [name] is presenting with a recent stroke , I plan to follow up either personally or through our NP within 2-4 month.   CC: I will share my notes with Frann Rider of the STROKE team. .  Electronically signed by: Larey Seat, MD 01/17/2021 3:28 PM  [name] and [facility] Sleep Board certified by The [employer] of Sleep Medicine and Diplomate of the [employer] of Sleep Medicine. Board certified In Neurology through the [employer], Fellow of the [employer] of Neurology. Medical Director of [employer].

## 2021-01-30 ENCOUNTER — Ambulatory Visit (INDEPENDENT_AMBULATORY_CARE_PROVIDER_SITE_OTHER): Payer: Medicare Other

## 2021-01-30 DIAGNOSIS — I639 Cerebral infarction, unspecified: Secondary | ICD-10-CM

## 2021-01-30 LAB — CUP PACEART REMOTE DEVICE CHECK
Date Time Interrogation Session: 20220904185132
Implantable Pulse Generator Implant Date: 20220802

## 2021-02-08 DIAGNOSIS — M1712 Unilateral primary osteoarthritis, left knee: Secondary | ICD-10-CM | POA: Diagnosis not present

## 2021-02-08 DIAGNOSIS — M25562 Pain in left knee: Secondary | ICD-10-CM | POA: Diagnosis not present

## 2021-02-08 NOTE — Progress Notes (Signed)
Carelink Summary Report / Loop Recorder 

## 2021-02-21 ENCOUNTER — Telehealth: Payer: Self-pay

## 2021-02-21 NOTE — Telephone Encounter (Signed)
LVM for pt to call me back to schedule sleep study  

## 2021-03-05 ENCOUNTER — Ambulatory Visit (INDEPENDENT_AMBULATORY_CARE_PROVIDER_SITE_OTHER): Payer: Medicare Other

## 2021-03-05 DIAGNOSIS — I639 Cerebral infarction, unspecified: Secondary | ICD-10-CM | POA: Diagnosis not present

## 2021-03-07 LAB — CUP PACEART REMOTE DEVICE CHECK
Date Time Interrogation Session: 20221007185155
Implantable Pulse Generator Implant Date: 20220802

## 2021-03-09 ENCOUNTER — Ambulatory Visit (INDEPENDENT_AMBULATORY_CARE_PROVIDER_SITE_OTHER): Payer: Medicare Other | Admitting: Internal Medicine

## 2021-03-09 ENCOUNTER — Other Ambulatory Visit: Payer: Self-pay

## 2021-03-09 ENCOUNTER — Emergency Department (HOSPITAL_COMMUNITY): Payer: Medicare Other

## 2021-03-09 ENCOUNTER — Encounter: Payer: Self-pay | Admitting: Internal Medicine

## 2021-03-09 ENCOUNTER — Telehealth: Payer: Self-pay | Admitting: Internal Medicine

## 2021-03-09 ENCOUNTER — Emergency Department (HOSPITAL_COMMUNITY)
Admission: EM | Admit: 2021-03-09 | Discharge: 2021-03-10 | Disposition: A | Discharge status: Home / Self Care | Payer: Medicare Other | Attending: Emergency Medicine | Admitting: Emergency Medicine

## 2021-03-09 ENCOUNTER — Encounter (HOSPITAL_COMMUNITY): Payer: Self-pay | Admitting: Emergency Medicine

## 2021-03-09 VITALS — BP 128/66 | HR 83 | Ht 62.0 in | Wt 155.0 lb

## 2021-03-09 DIAGNOSIS — Z79899 Other long term (current) drug therapy: Secondary | ICD-10-CM | POA: Insufficient documentation

## 2021-03-09 DIAGNOSIS — Z87891 Personal history of nicotine dependence: Secondary | ICD-10-CM | POA: Diagnosis not present

## 2021-03-09 DIAGNOSIS — M2578 Osteophyte, vertebrae: Secondary | ICD-10-CM | POA: Diagnosis not present

## 2021-03-09 DIAGNOSIS — M5137 Other intervertebral disc degeneration, lumbosacral region: Secondary | ICD-10-CM | POA: Insufficient documentation

## 2021-03-09 DIAGNOSIS — Z7951 Long term (current) use of inhaled steroids: Secondary | ICD-10-CM | POA: Diagnosis not present

## 2021-03-09 DIAGNOSIS — R1011 Right upper quadrant pain: Secondary | ICD-10-CM | POA: Insufficient documentation

## 2021-03-09 DIAGNOSIS — Z20822 Contact with and (suspected) exposure to covid-19: Secondary | ICD-10-CM | POA: Insufficient documentation

## 2021-03-09 DIAGNOSIS — R945 Abnormal results of liver function studies: Secondary | ICD-10-CM | POA: Insufficient documentation

## 2021-03-09 DIAGNOSIS — I10 Essential (primary) hypertension: Secondary | ICD-10-CM

## 2021-03-09 DIAGNOSIS — R109 Unspecified abdominal pain: Secondary | ICD-10-CM | POA: Diagnosis not present

## 2021-03-09 DIAGNOSIS — R7989 Other specified abnormal findings of blood chemistry: Secondary | ICD-10-CM

## 2021-03-09 DIAGNOSIS — Z7982 Long term (current) use of aspirin: Secondary | ICD-10-CM | POA: Insufficient documentation

## 2021-03-09 DIAGNOSIS — R1013 Epigastric pain: Secondary | ICD-10-CM

## 2021-03-09 DIAGNOSIS — E119 Type 2 diabetes mellitus without complications: Secondary | ICD-10-CM | POA: Insufficient documentation

## 2021-03-09 DIAGNOSIS — J45909 Unspecified asthma, uncomplicated: Secondary | ICD-10-CM | POA: Diagnosis not present

## 2021-03-09 DIAGNOSIS — N200 Calculus of kidney: Secondary | ICD-10-CM | POA: Diagnosis not present

## 2021-03-09 DIAGNOSIS — E1165 Type 2 diabetes mellitus with hyperglycemia: Secondary | ICD-10-CM

## 2021-03-09 DIAGNOSIS — M5136 Other intervertebral disc degeneration, lumbar region: Secondary | ICD-10-CM | POA: Diagnosis not present

## 2021-03-09 DIAGNOSIS — E559 Vitamin D deficiency, unspecified: Secondary | ICD-10-CM | POA: Diagnosis not present

## 2021-03-09 DIAGNOSIS — Z7984 Long term (current) use of oral hypoglycemic drugs: Secondary | ICD-10-CM | POA: Diagnosis not present

## 2021-03-09 DIAGNOSIS — N281 Cyst of kidney, acquired: Secondary | ICD-10-CM | POA: Diagnosis not present

## 2021-03-09 DIAGNOSIS — Z981 Arthrodesis status: Secondary | ICD-10-CM | POA: Diagnosis not present

## 2021-03-09 DIAGNOSIS — Z794 Long term (current) use of insulin: Secondary | ICD-10-CM

## 2021-03-09 LAB — BASIC METABOLIC PANEL
BUN: 11 mg/dL (ref 6–23)
CO2: 27 mEq/L (ref 19–32)
Calcium: 9.1 mg/dL (ref 8.4–10.5)
Chloride: 106 mEq/L (ref 96–112)
Creatinine, Ser: 0.74 mg/dL (ref 0.40–1.20)
GFR: 82.41 mL/min (ref >60.00)
Glucose, Bld: 126 mg/dL — ABNORMAL HIGH (ref 70–99)
Potassium: 3.7 mEq/L (ref 3.5–5.1)
Sodium: 141 mEq/L (ref 135–145)

## 2021-03-09 LAB — CBC WITH DIFFERENTIAL/PLATELET
Abs Immature Granulocytes: 0.01 10*3/uL (ref 0.00–0.07)
Basophils Absolute: 0 10*3/uL (ref 0.0–0.1)
Basophils Absolute: 0 10*3/uL (ref 0.0–0.1)
Basophils Relative: 0.4 % (ref 0.0–3.0)
Basophils Relative: 1 %
Eosinophils Absolute: 0 10*3/uL (ref 0.0–0.7)
Eosinophils Absolute: 0.1 10*3/uL (ref 0.0–0.5)
Eosinophils Relative: 1.5 % (ref 0.0–5.0)
Eosinophils Relative: 1 %
HCT: 39.4 % (ref 36.0–46.0)
HCT: 40.2 % (ref 36.0–46.0)
Hemoglobin: 12.8 g/dL (ref 12.0–15.0)
Hemoglobin: 13.1 g/dL (ref 12.0–15.0)
Immature Granulocytes: 0 %
Lymphocytes Relative: 31.3 % (ref 12.0–46.0)
Lymphocytes Relative: 34 %
Lymphs Abs: 1 10*3/uL (ref 0.7–4.0)
Lymphs Abs: 1.2 10*3/uL (ref 0.7–4.0)
MCH: 29.2 pg (ref 26.0–34.0)
MCHC: 32.6 g/dL (ref 30.0–36.0)
MCHC: 32.6 g/dL (ref 30.0–36.0)
MCV: 88.9 fl (ref 78.0–100.0)
MCV: 89.7 fL (ref 80.0–100.0)
Monocytes Absolute: 0.3 10*3/uL (ref 0.1–1.0)
Monocytes Absolute: 0.4 10*3/uL (ref 0.1–1.0)
Monocytes Relative: 8 % (ref 3.0–12.0)
Monocytes Relative: 10 %
Neutro Abs: 2 10*3/uL (ref 1.4–7.7)
Neutro Abs: 1.9 10*3/uL (ref 1.7–7.7)
Neutrophils Relative %: 58.8 % (ref 43.0–77.0)
Neutrophils Relative %: 54 %
Platelets: 213 10*3/uL (ref 150.0–400.0)
Platelets: 222 10*3/uL (ref 150–400)
RBC: 4.43 Mil/uL (ref 3.87–5.11)
RBC: 4.48 MIL/uL (ref 3.87–5.11)
RDW: 14.6 % (ref 11.5–15.5)
RDW: 14.3 % (ref 11.5–15.5)
WBC: 3.3 10*3/uL — ABNORMAL LOW (ref 4.0–10.5)
WBC: 3.5 10*3/uL — ABNORMAL LOW (ref 4.0–10.5)
nRBC: 0 % (ref 0.0–0.2)

## 2021-03-09 LAB — COMPREHENSIVE METABOLIC PANEL
ALT: 249 U/L — ABNORMAL HIGH (ref 0–44)
AST: 165 U/L — ABNORMAL HIGH (ref 15–41)
Albumin: 3.8 g/dL (ref 3.5–5.0)
Alkaline Phosphatase: 69 U/L (ref 38–126)
Anion gap: 10 (ref 5–15)
BUN: 9 mg/dL (ref 8–23)
CO2: 22 mmol/L (ref 22–32)
Calcium: 8.7 mg/dL — ABNORMAL LOW (ref 8.9–10.3)
Chloride: 108 mmol/L (ref 98–111)
Creatinine, Ser: 0.75 mg/dL (ref 0.44–1.00)
GFR, Estimated: >60 mL/min (ref >60)
Glucose, Bld: 104 mg/dL — ABNORMAL HIGH (ref 70–99)
Potassium: 3.5 mmol/L (ref 3.5–5.1)
Sodium: 140 mmol/L (ref 135–145)
Total Bilirubin: 0.9 mg/dL (ref 0.3–1.2)
Total Protein: 7.3 g/dL (ref 6.5–8.1)

## 2021-03-09 LAB — HEPATIC FUNCTION PANEL
ALT: 249 U/L — ABNORMAL HIGH (ref 0–35)
AST: 157 U/L — ABNORMAL HIGH (ref 0–37)
Albumin: 4.1 g/dL (ref 3.5–5.2)
Alkaline Phosphatase: 74 U/L (ref 39–117)
Bilirubin, Direct: 0.2 mg/dL (ref 0.0–0.3)
Total Bilirubin: 0.4 mg/dL (ref 0.2–1.2)
Total Protein: 7.7 g/dL (ref 6.0–8.3)

## 2021-03-09 LAB — LIPASE, BLOOD: Lipase: 25 U/L (ref 11–51)

## 2021-03-09 LAB — LIPASE: Lipase: 12 U/L (ref 11.0–59.0)

## 2021-03-09 LAB — H. PYLORI ANTIBODY, IGG: H Pylori IgG: NEGATIVE

## 2021-03-09 MED ORDER — PANTOPRAZOLE SODIUM 40 MG PO TBEC: 40.0000 mg | DELAYED_RELEASE_TABLET | Freq: Every day | ORAL | 3 refills | Status: DC

## 2021-03-09 NOTE — Patient Instructions (Addendum)
Your MyChart ID (username) is [place] password is Therese Sarah  Please take all new medication as prescribed - the protonix 40 mg per day (sent to [facility])  Please continue all other medications as before, and refills have been done if requested.  Please have the pharmacy call with any other refills you may need.  Please continue your efforts at being more active, low cholesterol diet, and weight control.  Please keep your appointments with your specialists as you may have planned  You will be contacted regarding the referral for: Gastroenterology - Dr Ardis Hughs  Please go to the LAB at the blood drawing area for the tests to be done  You will be contacted by phone if any changes need to be made immediately.  Otherwise, you will receive a letter about your results with an explanation, but please check with MyChart first.  Please remember to sign up for MyChart if you have not done so, as this will be important to you in the future with finding out test results, communicating by private email, and scheduling acute appointments online when needed.  Please make an Appointment to return in 6 months, or sooner if needed

## 2021-03-09 NOTE — Telephone Encounter (Signed)
Spoke to pt  Pt had elevated LFTs on lab today  Pt asked to go to ED now to r/o GB disease as cause of pain and elevated LFTs

## 2021-03-09 NOTE — ED Triage Notes (Signed)
Pt endorses abd pain x2 weeks. States she went to her PCP today and was told her liver tests were elevated. Denies n/v/d.

## 2021-03-09 NOTE — Progress Notes (Signed)
Patient ID: Tiffany [name], female   DOB: 1950-08-23, 70 y.o.   MRN: [id number]        Chief Complaint: follow up recent bloating       HPI:  Tiffany [name] is a 70 y.o. female here with c/o 2 wks onset bloating and feeling of distension, associated with epigastric pain, dull with radiation to the back, drinking water makes worse, pepcid bid does not help,  Last EGD 2007 without significant findings.  Denies fever, chills, n/v, blood, lower appetite or diarrhea/constipation.   Pt denies fever, wt loss, night sweats, loss of appetite, or other constitutional symptoms          Wt Readings from Last 3 Encounters:  03/09/21 154 lb 5.2 oz (70 kg)  03/09/21 155 lb (70.3 kg)  01/17/21 157 lb (71.2 kg)   BP Readings from Last 3 Encounters:  03/10/21 125/66  03/09/21 128/66  01/17/21 (!) 148/81         Past Medical History:  Diagnosis Date   Degenerative joint disease    Diabetes mellitus    Headache    Hyperlipidemia    Hypertension    Sleep apnea    stopped using it   Past Surgical History:  Procedure Laterality Date   [place] ARTHROSCOPY     left x 2   [place]    reports that she quit smoking about 30 years ago. Her smoking use included cigarettes. She has a 7.50 pack-year smoking history. She has never used smokeless tobacco. She reports that she does not drink alcohol and does not use drugs. family history includes Diabetes in her mother and sister; Heart disease in her brother and mother; Hypertension in her brother and mother; Kidney disease in her mother; Stroke in her brother; Thyroid disease in her mother. Allergies  Allergen Reactions   Contrast Media [Iodinated Diagnostic Agents] Anaphylaxis, Nausea And Vomiting, Swelling and Other (See Comments)    Patients throat closes with nausea and vomiting    Aspirin Nausea And Vomiting and Other (See Comments)    Can take EC aspirin   Gadolinium Derivatives Nausea  And Vomiting   Metformin And Related Nausea And Vomiting and Other (See Comments)    Severe nausea and vomiting   Shellfish Allergy Nausea And Vomiting   Tramadol Nausea And Vomiting and Other (See Comments)    Migraines   Penicillins Nausea And Vomiting   Statins Other (See Comments)    Myalgias    Current Outpatient Medications on File Prior to Visit  Medication Sig Dispense Refill   albuterol (VENTOLIN HFA) 108 (90 Base) MCG/ACT inhaler Inhale 2 puffs into the lungs every 6 (six) hours as needed for wheezing or shortness of breath. 8 g 5   amLODipine (NORVASC) 5 MG tablet TAKE 1 TABLET BY MOUTH EVERY DAY (Patient taking differently: Take 5 mg by mouth daily.) 90 tablet 3   aspirin EC 325 MG EC tablet Take 1 tablet (325 mg total) by mouth daily. 30 tablet 0   Blood Glucose Monitoring Suppl (ONE TOUCH ULTRA 2) w/Device KIT Apply 1 Device topically in the morning and at bedtime. E11.9 1 kit 0   Cholecalciferol (VITAMIN D) 50 MCG (2000 UT) tablet Take 2,000 Units by mouth daily.     clopidogrel (PLAVIX) 75 MG tablet Take 1 tablet (75 mg total) by mouth daily. 90 tablet 3   EPINEPHrine 0.3 mg/0.3 mL IJ  SOAJ injection Inject 0.3 mLs (0.3 mg total) into the muscle as needed for anaphylaxis. 2 each 1   ezetimibe (ZETIA) 10 MG tablet Take 1 tablet (10 mg total) by mouth daily. 90 tablet 3   famotidine (PEPCID) 20 MG tablet Take 1 tablet (20 mg total) by mouth 2 (two) times daily. (Patient not taking: Reported on 03/10/2021) 180 tablet 3   gabapentin (NEURONTIN) 400 MG capsule TAKE 1 CAPSULE BY MOUTH THREE TIMES DAILY (Patient taking differently: Take 400 mg by mouth 2 (two) times daily.) 270 capsule 1   glucose blood (ONETOUCH ULTRA) test strip USE AS DIRECTED TWICE DAILY 200 each 0   Insulin Pen Needle 32G X 4 MM MISC Use to inject  lantus once daily (Patient taking differently: Use to inject  lantus once daily) 100 each 4   LANTUS SOLOSTAR 100 UNIT/ML Solostar Pen INJECT 55 UNITS INTO THE SKIN  DAILY. (Patient taking differently: Inject 55 Units into the skin at bedtime.) 100 mL 2   losartan (COZAAR) 100 MG tablet Take 0.5 tablets (50 mg total) by mouth daily. 90 tablet 3   meloxicam (MOBIC) 15 MG tablet Take 1 tablet (15 mg total) by mouth daily as needed for pain. 30 tablet 5   pravastatin (PRAVACHOL) 40 MG tablet Take 1 tablet (40 mg total) by mouth daily at 6 PM. 90 tablet 3   tiZANidine (ZANAFLEX) 4 MG tablet TAKE 1 TABLET BY MOUTH EVERY 8 HOURS AS NEEDED FOR MUSCLE SPASM (Patient taking differently: Take 4 mg by mouth every 8 (eight) hours as needed for muscle spasms.) 90 tablet 5   traMADol (ULTRAM) 50 MG tablet Take 1 tablet (50 mg total) by mouth every 6 (six) hours as needed. (Patient not taking: No sig reported) 30 tablet 0   triamcinolone (NASACORT) 55 MCG/ACT AERO nasal inhaler Place 2 sprays into the nose daily. (Patient taking differently: Place 2 sprays into the nose daily as needed (for allergies).) 3 each 3   vitamin B-12 (CYANOCOBALAMIN) 1000 MCG tablet Take 1 tablet by mouth once daily (Patient taking differently: Take 1,000 mcg by mouth daily.) 90 tablet 0   zolpidem (AMBIEN) 10 MG tablet Take 1 tablet (10 mg total) by mouth at bedtime as needed. (Patient taking differently: Take 10 mg by mouth at bedtime as needed for sleep.) 90 tablet 1   Current Facility-Administered Medications on File Prior to Visit  Medication Dose Route Frequency Provider Last Rate Last Admin   lidocaine-EPINEPHrine (XYLOCAINE W/EPI) 1 %-1:100000 (with pres) injection 10 mL  10 mL Infiltration Once Croitoru, Mihai, MD            ROS:  All others reviewed and negative.  Objective        PE:  BP 128/66 (BP Location: Right Arm, Patient Position: Sitting, Cuff Size: Large)   Pulse 83   Ht 5' 2"  (1.575 m)   Wt 155 lb (70.3 kg)   SpO2 98%   BMI 28.35 kg/m                 Constitutional: Pt appears in NAD               HENT: Head: NCAT.                Right Ear: External ear normal.                  Left Ear: External ear normal.  Eyes: . Pupils are equal, round, and reactive to light. Conjunctivae and EOM are normal               Nose: without d/c or deformity               Neck: Neck supple. Gross normal ROM               Cardiovascular: Normal rate and regular rhythm.                 Pulmonary/Chest: Effort normal and breath sounds without rales or wheezing.                Abd:  Soft, mild epigastric tender,, ND, + BS, no organomegaly               Neurological: Pt is alert. At baseline orientation, motor grossly intact               Skin: Skin is warm. No rashes, no other new lesions, LE edema - none               Psychiatric: Pt behavior is normal without agitation   Micro: none  Cardiac tracings I have personally interpreted today:  none  Pertinent Radiological findings (summarize): none   Lab Results  Component Value Date   WBC 3.5 (L) 03/09/2021   HGB 13.1 03/09/2021   HCT 40.2 03/09/2021   PLT 222 03/09/2021   GLUCOSE 104 (H) 03/09/2021   CHOL 163 12/19/2020   TRIG 47 12/19/2020   HDL 68 12/19/2020   LDLCALC 85 12/19/2020   ALT 249 (H) 03/09/2021   AST 165 (H) 03/09/2021   NA 140 03/09/2021   K 3.5 03/09/2021   CL 108 03/09/2021   CREATININE 0.75 03/09/2021   BUN 9 03/09/2021   CO2 22 03/09/2021   TSH 1.875 10/07/2020   INR 1.0 10/06/2020   HGBA1C 7.4 (H) 10/07/2020   MICROALBUR 1.2 04/26/2020   Assessment/Plan:  Nysa Sarin is a 70 y.o. Black or African American [2] female with  has a past medical history of Degenerative joint disease, Diabetes mellitus, Headache, Hyperlipidemia, Hypertension, and Sleep apnea.  Vitamin D deficiency Last vitamin D Lab Results  Component Value Date   VD25OH 41.19 04/26/2020   Stable, cont oral replacement   Type 2 diabetes mellitus with hyperglycemia, with long-term current use of insulin (HCC) Lab Results  Component Value Date   HGBA1C 7.4 (H) 10/07/2020   Mild uncontrolled, goal a1c <  7,, pt to continue current medical treatment lantus as declines change   Primary hypertension BP Readings from Last 3 Encounters:  03/10/21 125/66  03/09/21 128/66  01/17/21 (!) 148/81   Stable, pt to continue medical treatment norvasc, losartan   Epigastric pain Etiology unclear, cant r/o gastritis or GB issue; for protonix start, refer GI and labs including LFTs  Followup: No follow-ups on file.  Cathlean Cower, MD 03/11/2021 6:05 PM [facility]

## 2021-03-09 NOTE — ED Provider Notes (Signed)
Emergency Medicine Provider Triage Evaluation Note  Tiffany [name] , a 70 y.o. female  was evaluated in triage.  Pt complains of abd pain.  Review of Systems  Positive: Upper abd pain Negative: Fever, cp, sob, n/v/d, dysuria  Physical Exam  BP (!) 157/87 (BP Location: Left Arm)   Pulse 95   Temp 99.3 F (37.4 C) (Oral)   Resp 18   Ht 5\' 2"  (1.575 m)   Wt 70 kg   SpO2 100%   BMI 28.23 kg/m  Gen:   Awake, no distress   Resp:  Normal effort  MSK:   Moves extremities without difficulty  Other:  TTP epigastric  Medical Decision Making  Medically screening exam initiated at 6:25 PM.  Appropriate orders placed.  Tiffany [name] was informed that the remainder of the evaluation will be completed by another provider, this initial triage assessment does not replace that evaluation, and the importance of remaining in the ED until their evaluation is complete.  Pt here with recurrent upper abd pain x 2 weeks.  No n/v/d fever or chills.  Seen by PCP recently and sent here because of elevated liver enzyme. Not taking tylenol, no recent sickness, no hx of alcohol abuse.     Domenic Moras, PA-C 03/09/21 1826    Hayden Rasmussen, MD 03/09/21 [phone]

## 2021-03-10 ENCOUNTER — Encounter (HOSPITAL_COMMUNITY): Payer: Self-pay | Admitting: Emergency Medicine

## 2021-03-10 ENCOUNTER — Emergency Department (HOSPITAL_COMMUNITY): Payer: Medicare Other

## 2021-03-10 DIAGNOSIS — M5136 Other intervertebral disc degeneration, lumbar region: Secondary | ICD-10-CM | POA: Diagnosis not present

## 2021-03-10 DIAGNOSIS — R945 Abnormal results of liver function studies: Secondary | ICD-10-CM | POA: Diagnosis not present

## 2021-03-10 DIAGNOSIS — M2578 Osteophyte, vertebrae: Secondary | ICD-10-CM | POA: Diagnosis not present

## 2021-03-10 DIAGNOSIS — N281 Cyst of kidney, acquired: Secondary | ICD-10-CM | POA: Diagnosis not present

## 2021-03-10 DIAGNOSIS — N200 Calculus of kidney: Secondary | ICD-10-CM | POA: Diagnosis not present

## 2021-03-10 DIAGNOSIS — Z981 Arthrodesis status: Secondary | ICD-10-CM | POA: Diagnosis not present

## 2021-03-10 LAB — RESP PANEL BY RT-PCR (FLU A&B, COVID) ARPGX2
Influenza A by PCR: NEGATIVE
Influenza B by PCR: NEGATIVE
SARS Coronavirus 2 by RT PCR: NEGATIVE

## 2021-03-10 LAB — HEPATITIS PANEL, ACUTE
HCV Ab: NONREACTIVE
Hep A IgM: NONREACTIVE
Hep B C IgM: NONREACTIVE
Hepatitis B Surface Ag: NONREACTIVE

## 2021-03-10 LAB — CBG MONITORING, ED
Glucose-Capillary: 50 mg/dL — ABNORMAL LOW (ref 70–99)
Glucose-Capillary: 117 mg/dL — ABNORMAL HIGH (ref 70–99)
Glucose-Capillary: 268 mg/dL — ABNORMAL HIGH (ref 70–99)

## 2021-03-10 MED ORDER — ALUM & MAG HYDROXIDE-SIMETH 200-200-20 MG/5ML PO SUSP
30.0000 mL | Freq: Once | ORAL | Status: AC
  Administered 2021-03-10: 30 mL via ORAL
  Filled 2021-03-10: qty 30

## 2021-03-10 MED ORDER — ONDANSETRON 4 MG PO TBDP
8.0000 mg | ORAL_TABLET | Freq: Once | ORAL | Status: AC
  Filled 2021-03-10: qty 2

## 2021-03-10 MED ORDER — OXYCODONE HCL 5 MG PO TABS
10.0000 mg | ORAL_TABLET | Freq: Once | ORAL | Status: AC
Start: 2021-03-10 — End: 2021-03-10
  Administered 2021-03-10: 10 mg via ORAL
  Filled 2021-03-10: qty 2

## 2021-03-10 MED ORDER — ONDANSETRON 4 MG PO TBDP
ORAL_TABLET | ORAL | Status: AC
  Administered 2021-03-10: 8 mg via ORAL
  Filled 2021-03-10: qty 1

## 2021-03-10 NOTE — ED Notes (Signed)
E-signature pad unavailable at time of pt discharge. This RN discussed discharge materials with pt and answered all pt questions. Pt stated understanding of discharge material. ? ?

## 2021-03-10 NOTE — ED Notes (Signed)
Patient transported to CT 

## 2021-03-10 NOTE — Discharge Instructions (Addendum)
Start taking your protonix daily

## 2021-03-10 NOTE — ED Provider Notes (Signed)
[facility] EMERGENCY DEPARTMENT Provider Note   CSN: [id number] Arrival date & time: 03/09/21  1818     History Chief Complaint  Patient presents with   Abdominal Pain    Tiffany [name] is a 70 y.o. female.  The history is provided by the spouse.  Abdominal Pain Pain location:  RUQ and LUQ Pain quality: aching   Pain radiates to:  Does not radiate Pain severity:  Moderate Onset quality:  Gradual Duration:  2 weeks Timing:  Constant Progression:  Unchanged Chronicity:  New Context: not sick contacts, not suspicious food intake and not trauma   Relieved by:  Nothing Worsened by:  Nothing Ineffective treatments:  None tried (was started on protonix by her PMD) Associated symptoms: no anorexia, no chest pain, no dysuria, no fever, no shortness of breath, no vaginal discharge and no vomiting   Risk factors: no alcohol abuse       Past Medical History:  Diagnosis Date   Degenerative joint disease    Diabetes mellitus    Headache    Hyperlipidemia    Hypertension    Sleep apnea    stopped using it    Patient Active Problem List   Diagnosis Date Noted   Ischemic stroke ([place]) 01/17/2021   Intracranial vascular stenosis 01/17/2021   Primary hypertension 01/17/2021   Type 2 diabetes mellitus with hyperglycemia, with long-term current use of insulin ([place]) 01/17/2021   History of completed stroke 10/31/2020   Stroke ([place]) 10/06/2020   Lipoma 08/26/2020   Asthma 08/26/2020   Bilateral leg pain 08/25/2020   Encounter for well adult exam with abnormal findings 05/06/2020   Allergic rhinitis 05/06/2020   Statin myopathy 05/02/2020   Left shoulder pain 11/01/2019   Chronic low back pain 05/03/2019   B12 deficiency 05/03/2019   Left leg swelling 11/16/2018   Left knee pain 11/16/2018   Low back pain 05/14/2018   Abnormal SPEP 11/18/2017   Sternoclavicular joint pain, left 08/19/2017   Cervical radiculopathy at C8 05/15/2017   Pre-ulcerative  corn or callous 03/27/2017   Vitamin D deficiency 03/27/2017   Elevated total protein 03/27/2017   Bilateral shoulder pain 03/27/2017   Medicare annual wellness visit, subsequent 03/06/2016   Spondylolisthesis at L4-L5 level 10/06/2015   Acute frontal sinusitis 07/14/2015   Sleep disturbance 07/14/2015   Knee pain, left 11/10/2014   Sprain of ankle 04/11/2014   GERD (gastroesophageal reflux disease) 03/07/2014   Prolapse of vaginal wall with midline cystocele, grade 1 08/23/2013   Vaginal dryness 05/14/2012   Dry mouth 05/13/2012   Depression and insomnia 07/19/2010   Multilevel degenerative disc disease of cervical and lumbar spine  01/16/2008   Essential hypertension 01/01/2008   Diabetes ([place]) 03/18/2007   Hyperlipidemia 02/13/2007   SLEEP APNEA 02/13/2007    Past Surgical History:  Procedure Laterality Date   ABDOMINAL HYSTERECTOMY  1976   BACK SURGERY     KNEE ARTHROSCOPY     left x 2   LUMBAR LAMINECTOMY  1989     OB History   No obstetric history on file.     Family History  Problem Relation Age of Onset   Hypertension Mother    Diabetes Mother    Thyroid disease Mother    Heart disease Mother    Kidney disease Mother    Diabetes Sister    Hypertension Brother    Stroke Brother    Heart disease Brother    Colon cancer Neg Hx  Colon polyps Neg Hx    Rectal cancer Neg Hx    Stomach cancer Neg Hx     Social History   Tobacco Use   Smoking status: Former    Packs/day: 0.50    Years: 15.00    Pack years: 7.50    Types: Cigarettes    Quit date: 08/02/1990    Years since quitting: 30.6   Smokeless tobacco: Never  Vaping Use   Vaping Use: Never used  Substance Use Topics   Alcohol use: No    Alcohol/week: 0.0 standard drinks   Drug use: No    Home Medications Prior to Admission medications   Medication Sig Start Date End Date Taking? Authorizing Provider  albuterol (VENTOLIN HFA) 108 (90 Base) MCG/ACT inhaler Inhale 2 puffs into the lungs  every 6 (six) hours as needed for wheezing or shortness of breath. 08/25/20   Biagio Borg, MD  amLODipine (NORVASC) 5 MG tablet TAKE 1 TABLET BY MOUTH EVERY DAY Patient taking differently: Take 5 mg by mouth daily. 07/14/20   Biagio Borg, MD  aspirin EC 325 MG EC tablet Take 1 tablet (325 mg total) by mouth daily. 10/09/20   Rosalin Hawking, MD  Blood Glucose Monitoring Suppl (ONE TOUCH ULTRA 2) w/Device KIT Apply 1 Device topically in the morning and at bedtime. E11.9 08/09/19   Biagio Borg, MD  Cholecalciferol (VITAMIN D) 50 MCG (2000 UT) tablet Take 2,000 Units by mouth daily.    [provider]  clopidogrel (PLAVIX) 75 MG tablet Take 1 tablet (75 mg total) by mouth daily. 01/09/21   Croitoru, Mihai, MD  conjugated estrogens (PREMARIN) vaginal cream Place 1 Applicatorful vaginally daily. 3 days a week    [provider]  EPINEPHrine 0.3 mg/0.3 mL IJ SOAJ injection Inject 0.3 mLs (0.3 mg total) into the muscle as needed for anaphylaxis. 11/01/19   Biagio Borg, MD  estradiol (ESTRACE) 0.1 MG/GM vaginal cream Place vaginally. 02/29/20   [provider]  ezetimibe (ZETIA) 10 MG tablet Take 1 tablet (10 mg total) by mouth daily. 12/25/20   Croitoru, Mihai, MD  famotidine (PEPCID) 20 MG tablet Take 1 tablet (20 mg total) by mouth 2 (two) times daily. 05/31/20   Biagio Borg, MD  gabapentin (NEURONTIN) 400 MG capsule TAKE 1 CAPSULE BY MOUTH THREE TIMES DAILY 01/11/21   Biagio Borg, MD  glucose blood [facility] ULTRA) test strip USE AS DIRECTED TWICE DAILY 10/10/20   Biagio Borg, MD  Insulin Pen Needle 32G X 4 MM MISC Use to inject  lantus once daily Patient taking differently: Use to inject  lantus once daily 03/19/16   Gold[place] Circle, FNP  LANTUS SOLOSTAR 100 UNIT/ML Solostar Pen INJECT 55 UNITS INTO THE SKIN DAILY. 07/11/20   Biagio Borg, MD  losartan (COZAAR) 100 MG tablet Take 0.5 tablets (50 mg total) by mouth daily. 10/08/20   Rosalin Hawking, MD  meloxicam (MOBIC) 15 MG tablet  Take 1 tablet (15 mg total) by mouth daily as needed for pain. 12/12/20   Biagio Borg, MD  pantoprazole (PROTONIX) 40 MG tablet Take 1 tablet (40 mg total) by mouth daily. 03/09/21   Biagio Borg, MD  pravastatin (PRAVACHOL) 40 MG tablet Take 1 tablet (40 mg total) by mouth daily at 6 PM. 12/25/20   Croitoru, Mihai, MD  tiZANidine (ZANAFLEX) 4 MG tablet TAKE 1 TABLET BY MOUTH EVERY 8 HOURS AS NEEDED FOR MUSCLE SPASM Patient taking differently: Take  4 mg by mouth every 8 (eight) hours as needed for muscle spasms. 05/02/20   Biagio Borg, MD  traMADol (ULTRAM) 50 MG tablet Take 1 tablet (50 mg total) by mouth every 6 (six) hours as needed. 11/03/20   Biagio Borg, MD  triamcinolone (NASACORT) 55 MCG/ACT AERO nasal inhaler Place 2 sprays into the nose daily. Patient taking differently: Place 2 sprays into the nose daily as needed (for allergies). 05/02/20   Biagio Borg, MD  vitamin B-12 (CYANOCOBALAMIN) 1000 MCG tablet Take 1 tablet by mouth once daily 11/23/20   Biagio Borg, MD  zolpidem (AMBIEN) 10 MG tablet Take 1 tablet (10 mg total) by mouth at bedtime as needed. Patient taking differently: Take 10 mg by mouth at bedtime as needed for sleep. 05/02/20   Biagio Borg, MD    Allergies    Contrast media [iodinated diagnostic agents], Aspirin, Gadolinium derivatives, Metformin and related, Shellfish allergy, Tramadol, Penicillins, and Statins  Review of Systems   Review of Systems  Constitutional:  Negative for fever.  HENT:  Negative for facial swelling.   Eyes:  Negative for redness.  Respiratory:  Negative for shortness of breath.   Cardiovascular:  Negative for chest pain.  Gastrointestinal:  Positive for abdominal pain. Negative for anorexia and vomiting.  Genitourinary:  Negative for dysuria and vaginal discharge.  Musculoskeletal:  Negative for neck stiffness.  Skin:  Negative for rash.  Neurological:  Negative for facial asymmetry.  Psychiatric/Behavioral:  Negative for agitation.    All other systems reviewed and are negative.  Physical Exam Updated Vital Signs BP 124/60   Pulse (!) 58   Temp 98.5 F (36.9 C) (Oral)   Resp 10   Ht $R'5\' 2"'OJ$  (1.575 m)   Wt 70 kg   SpO2 98%   BMI 28.23 kg/m   Physical Exam Vitals and nursing note reviewed.  Constitutional:      Appearance: Normal appearance.  HENT:     Head: Normocephalic and atraumatic.     Nose: Nose normal.  Eyes:     Conjunctiva/sclera: Conjunctivae normal.     Pupils: Pupils are equal, round, and reactive to light.  Cardiovascular:     Rate and Rhythm: Normal rate and regular rhythm.     Pulses: Normal pulses.     Heart sounds: Normal heart sounds.  Pulmonary:     Effort: Pulmonary effort is normal.     Breath sounds: Normal breath sounds.  Abdominal:     General: Abdomen is flat. Bowel sounds are normal.     Palpations: Abdomen is soft.     Tenderness: There is no abdominal tenderness. There is no guarding or rebound.     Hernia: No hernia is present.  Musculoskeletal:        General: Normal range of motion.     Cervical back: Normal range of motion and neck supple.  Skin:    General: Skin is warm and dry.     Capillary Refill: Capillary refill takes less than 2 seconds.  Neurological:     General: No focal deficit present.     Mental Status: She is alert and oriented to person, place, and time.     Deep Tendon Reflexes: Reflexes normal.  Psychiatric:        Mood and Affect: Mood normal.        Behavior: Behavior normal.    ED Results / Procedures / Treatments   Labs (all labs ordered are listed, but only  abnormal results are displayed) Labs Reviewed  CBC WITH DIFFERENTIAL/PLATELET - Abnormal; Notable for the following components:      Result Value   WBC 3.5 (*)    All other components within normal limits  COMPREHENSIVE METABOLIC PANEL - Abnormal; Notable for the following components:   Glucose, Bld 104 (*)    Calcium 8.7 (*)    AST 165 (*)    ALT 249 (*)    All other  components within normal limits  CBG MONITORING, ED - Abnormal; Notable for the following components:   Glucose-Capillary 50 (*)    All other components within normal limits  CBG MONITORING, ED - Abnormal; Notable for the following components:   Glucose-Capillary 117 (*)    All other components within normal limits  CBG MONITORING, ED - Abnormal; Notable for the following components:   Glucose-Capillary 268 (*)    All other components within normal limits  RESP PANEL BY RT-PCR (FLU A&B, COVID) ARPGX2  LIPASE, BLOOD  URINALYSIS, ROUTINE W REFLEX MICROSCOPIC  HEPATITIS PANEL, ACUTE    EKG None  Radiology US Abdomen Limited  Result Date: 03/09/2021 CLINICAL DATA:  Upper abdominal pain EXAM: ULTRASOUND ABDOMEN LIMITED RIGHT UPPER QUADRANT COMPARISON:  None. FINDINGS: Gallbladder: No gallstones or wall thickening visualized. No sonographic Murphy sign noted by sonographer. Common bile duct: Diameter: 5 mm Liver: No focal lesion identified. Within normal limits in parenchymal echogenicity. Portal vein is patent on color Doppler imaging with normal direction of blood flow towards the liver. Other: There is a calculus of the right kidney measuring 8 x 5 x 6 mm. No hydronephrosis. IMPRESSION: Normal right upper quadrant ultrasound. Electronically Signed   By: Ulyses Jarred M.D.   On: 03/09/2021 20:00   CT Renal Stone Study  Result Date: 03/10/2021 CLINICAL DATA:  Flank pain with kidney stone suspected EXAM: CT ABDOMEN AND PELVIS WITHOUT CONTRAST TECHNIQUE: Multidetector CT imaging of the abdomen and pelvis was performed following the standard protocol without IV contrast. COMPARISON:  02/22/2008 FINDINGS: Lower chest:  Coronary atherosclerosis. Hepatobiliary: No focal liver abnormality.No evidence of biliary obstruction or stone. Pancreas: Unremarkable. Spleen: Unremarkable. Adrenals/Urinary Tract: Negative adrenals. No hydronephrosis or ureteral stone. Cyst with layering calcification at the  right renal hilum measuring up to 2.3 cm, seen since at least 2015 lumbar MRI. More lateral and possibly urinary 5 mm stone the right renal hilum. Two left renal calculi measuring up to 3 mm at the left lower pole. Unremarkable bladder. Stomach/Bowel:  No obstruction. No appendicitis. Vascular/Lymphatic: No acute vascular abnormality. Atheromatous calcification no mass or adenopathy. Reproductive:Hysterectomy Other: No ascites or pneumoperitoneum. Musculoskeletal: No acute abnormalities. Notable L1-2 herniation with buttressing osteophyte, narrowing the spinal canal. L4-5 solid arthrodesis. IMPRESSION: 1. No acute finding. 2. Nonobstructing renal calculi. 3. Atherosclerosis including the coronary arteries. Electronically Signed   By: Jorje Guild M.D.   On: 03/10/2021 04:19    Procedures Procedures   Medications Ordered in ED Medications  oxyCODONE (Oxy IR/ROXICODONE) immediate release tablet 10 mg (10 mg Oral Given 03/10/21 0341)  alum & mag hydroxide-simeth (MAALOX/MYLANTA) 200-200-20 MG/5ML suspension 30 mL (30 mLs Oral Given 03/10/21 0410)    ED Course  I have reviewed the triage vital signs and the nursing notes.  Pertinent labs & imaging results that were available during my care of the patient were reviewed by me and considered in my medical decision making (see chart for details).  Hepatitis panel sent,  can be followed up as an outpatient by patient's PMD.  Patient's symptoms consistent with GERD.  Start your protonix and follow up with GI for ongoing concerns  Rolando Whitby was evaluated in Emergency Department on 03/10/2021 for the symptoms described in the history of present illness. She was evaluated in the context of the global COVID-19 pandemic, which necessitated consideration that the patient might be at risk for infection with the SARS-CoV-2 virus that causes COVID-19. Institutional protocols and algorithms that pertain to the evaluation of patients at risk for COVID-19 are  in a state of rapid change based on information released by regulatory bodies including the CDC and federal and state organizations. These policies and algorithms were followed during the patient's care in the ED.  Final Clinical Impression(s) / ED Diagnoses Final diagnoses:  Elevated liver function tests  Person under investigation for COVID-19  DDD (degenerative disc disease), lumbosacral  Return for intractable cough, coughing up blood, fevers > 100.4 unrelieved by medication, shortness of breath, intractable vomiting, chest pain, shortness of breath, weakness, numbness, changes in speech, facial asymmetry, abdominal pain, passing out, Inability to tolerate liquids or food, cough, altered mental status or any concerns. No signs of systemic illness or infection. The patient is nontoxic-appearing on exam and vital signs are within normal limits.  I have reviewed the triage vital signs and the nursing notes. Pertinent labs & imaging results that were available during my care of the patient were reviewed by me and considered in my medical decision making (see chart for details). After history, exam, and medical workup I feel the patient has been appropriately medically screened and is safe for discharge home. Pertinent diagnoses were discussed with the patient. Patient was given return precautions.     [place], Priscilla Finklea, MD 03/10/21 [phone]

## 2021-03-11 DIAGNOSIS — R1013 Epigastric pain: Secondary | ICD-10-CM | POA: Insufficient documentation

## 2021-03-11 NOTE — Assessment & Plan Note (Signed)
BP Readings from Last 3 Encounters:  03/10/21 125/66  03/09/21 128/66  01/17/21 (!) 148/81   Stable, pt to continue medical treatment norvasc, losartan

## 2021-03-11 NOTE — Assessment & Plan Note (Signed)
Etiology unclear, cant r/o gastritis or GB issue; for protonix start, refer GI and labs including LFTs

## 2021-03-11 NOTE — Assessment & Plan Note (Signed)
Last vitamin D Lab Results  Component Value Date   VD25OH 41.19 04/26/2020   Stable, cont oral replacement

## 2021-03-11 NOTE — Assessment & Plan Note (Signed)
Lab Results  Component Value Date   HGBA1C 7.4 (H) 10/07/2020   Mild uncontrolled, goal a1c < 7,, pt to continue current medical treatment lantus as declines change

## 2021-03-13 ENCOUNTER — Telehealth: Payer: Self-pay

## 2021-03-13 DIAGNOSIS — R7989 Other specified abnormal findings of blood chemistry: Secondary | ICD-10-CM

## 2021-03-13 DIAGNOSIS — R1013 Epigastric pain: Secondary | ICD-10-CM

## 2021-03-13 DIAGNOSIS — M545 Low back pain, unspecified: Secondary | ICD-10-CM

## 2021-03-13 DIAGNOSIS — G8929 Other chronic pain: Secondary | ICD-10-CM

## 2021-03-13 NOTE — Telephone Encounter (Signed)
Patient would like to know if she needs ER f/u visit and would like to know the cause for elevated liver tests

## 2021-03-13 NOTE — Telephone Encounter (Signed)
Pt is asking that Dr. Jenny Reichmann or his nurse please call her back as she is needing clarity about her paperwork from the hospital where Dr. Jenny Reichmann sent her for a liver test.

## 2021-03-13 NOTE — Telephone Encounter (Signed)
Unfortuantely I dont anything else to add  I will refer to GI, hopefully she will hear soon

## 2021-03-13 NOTE — Addendum Note (Signed)
Addended by: Biagio Borg on: 03/13/2021 05:19 PM   Modules accepted: Orders

## 2021-03-14 NOTE — Progress Notes (Signed)
Carelink Summary Report / Loop Recorder 

## 2021-03-14 NOTE — Telephone Encounter (Signed)
Patient notified

## 2021-03-17 ENCOUNTER — Other Ambulatory Visit: Payer: Self-pay | Admitting: Internal Medicine

## 2021-03-21 ENCOUNTER — Ambulatory Visit: Payer: Medicare Other | Admitting: Adult Health

## 2021-03-21 ENCOUNTER — Other Ambulatory Visit: Payer: Self-pay

## 2021-03-21 ENCOUNTER — Encounter: Payer: Self-pay | Admitting: Nurse Practitioner

## 2021-03-21 ENCOUNTER — Encounter: Payer: Self-pay | Admitting: Adult Health

## 2021-03-21 VITALS — BP 159/87 | HR 94 | Ht 62.0 in | Wt 156.0 lb

## 2021-03-21 DIAGNOSIS — I10 Essential (primary) hypertension: Secondary | ICD-10-CM | POA: Diagnosis not present

## 2021-03-21 DIAGNOSIS — I639 Cerebral infarction, unspecified: Secondary | ICD-10-CM | POA: Diagnosis not present

## 2021-03-21 DIAGNOSIS — E1165 Type 2 diabetes mellitus with hyperglycemia: Secondary | ICD-10-CM | POA: Diagnosis not present

## 2021-03-21 DIAGNOSIS — E785 Hyperlipidemia, unspecified: Secondary | ICD-10-CM

## 2021-03-21 DIAGNOSIS — Z794 Long term (current) use of insulin: Secondary | ICD-10-CM

## 2021-03-21 NOTE — Patient Instructions (Addendum)
Continue aspirin 325mg  daily and stop plavix at this time  Continue pravastatin and zetia per PCP/cardiology   Loop recorder will continue to be monitored by cardiology   Continue to follow with PCP for pain     Follow up in 6 months or call earlier if needed

## 2021-03-21 NOTE — Progress Notes (Signed)
[name] [address] [place]. [place] [address] [phone]       STROKE FOLLOW UP NOTE  [name] Date of Birth:  29-Sep-1950 Medical Record Number:  [id number]   Reason for Referral: stroke follow up    SUBJECTIVE:   CHIEF COMPLAINT:  Chief Complaint  Patient presents with   Follow-up    RM 3 alone  Pt is well and stable, no new concerns Is having R arm pain     HPI:   Update 03/21/2021 JM: returns for stroke follow up unaccompanied.  Overall stable from stroke standpoint without new stroke/TIA symptoms.  Reports right arm pain which has been ongoing since stroke. Completed therapies since prior visit. C/o multiple joint pain locations which is chronic. Current use of cane to help with BLE pains.  Denies any recent falls.  Continues to live alone maintaining ADLs and IADLs independently. She is scheduled to undergo sleep study next week but thinking about rescheduling due to other health issues currently and other personal issues.  She has remained on both aspirin and Plavix as well as pravastatin and Zetia without side effects.  Blood pressure today 159/87.  Monitors at home and has been stable.  No further concerns at this time.   History provided for reference purposes only Initial visit 11/16/2020 JM: Tiffany [name] is being seen for hospital follow-up unaccompanied.  Reports improvement of RUE strength with some weakness in wrist and forearm as well as shoulder fatigue/pain but has been gradually improving. Reports fatiguing quicker than prior to her stroke.  She does have chronic joint pain which she believes slightly worsened post stroke including right hip, left knee and shoulders.  She has been working with outpatient PT. Lives on own able to maintain all ADLs and IADLs independently. PCP Dr. Jenny Reichmann recently referred to ortho for ongoing knee pain - she is currently waiting to schedule.  Denies new stroke/TIA symptoms.  Remains on aspirin, Plavix  and pravastatin without associated side effects.  Blood pressure today 148/87. Monitors glucose levels at home - typically 100-120s.  Cardiac monitor completed 6/18 - currently waiting results.  Prior history of sleep apnea but has not used CPAP machine "in many years" as she believes her old one was not working correctly.  No further concerns at this time.  Stroke admission 10/06/2020 Tiffany [name] is a 70 yo female with a PMHx of OSA without use of CPAP, HTN, HLD, DM II, and DJD who presented on 10/06/2020 with right sided weakness. Personally reviewded hospitalization pertitent progress ntoes, lab work and imaging with summary provided. Stroke work up revealed left MCA infarct, likely due to L M2 high grade stenosis. Recommended 30 day cardiac event monitor outpatient to rule out A fib as potential etiology. Recommended 3 months DAPT then aspirin alone. LDL 159 - started pravastatin in addition to home zetia with hx of statin intolerance. A1c 7.4. OSA not on CPAP. Evaluated by therapies with recommended outpatient PT and discharged home in stable condition.  Stroke:  left MCA infarct s/p tPA, likely due to left M2 high-grade stenosis MRI left parietal, MCA/PCA, left MCA/ACA infarcts MRA head and neck left M2 high-grade stenosis 2D Echo EF 65 to 70% Will do 30-day cardiac event monitoring as outpatient to rule out A. Fib (Trish messaged) LDL 159 HgbA1c 7.4 UDS pending SCDs for VTE prophylaxis No antithrombotic prior to admission, now on ASA EC 325 and clopidogrel 75 mg daily for 3 months and then ASA alone.  Patient counseled to be compliant with her antithrombotic medications Ongoing aggressive stroke risk factor management Therapy recommendations: out pt PT Disposition: home today      ROS:   14 system review of systems performed and negative with exception of those listed in HPI  PMH:  Past Medical History:  Diagnosis Date   Degenerative joint disease    Diabetes mellitus     Headache    Hyperlipidemia    Hypertension    Sleep apnea    stopped using it    PSH:  Past Surgical History:  Procedure Laterality Date   ABDOMINAL HYSTERECTOMY  1976   BACK SURGERY     KNEE ARTHROSCOPY     left x 2   LUMBAR LAMINECTOMY  1989    Social History:  Social History   Socioeconomic History   Marital status: Single    Spouse name: Not on file   Number of children: 2   Years of education: 8   Highest education level: 8th grade  Occupational History   Occupation: Disability    Comment: retired day Runner, broadcasting/film/video  Tobacco Use   Smoking status: Former    Packs/day: 0.50    Years: 15.00    Pack years: 7.50    Types: Cigarettes    Quit date: 08/02/1990    Years since quitting: 30.6   Smokeless tobacco: Never  Vaping Use   Vaping Use: Never used  Substance and Sexual Activity   Alcohol use: No    Alcohol/week: 0.0 standard drinks   Drug use: No   Sexual activity: Never  Other Topics Concern   Not on file  Social History Narrative   Born in [place], [place].   Completed the 8th grade   Fun: Walk, crochet, puzzles   Diet - Variety of foods, does not eat breakfast   2 children - 44 and 45    6 grandchildren - 7 great grandchildren.    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   Right handed    Caffeine: 1/2 cup of coffee, rarely    Social Determinants of Radio broadcast assistant Strain: Low Risk    Difficulty of Paying Living Expenses: Not hard at all  Food Insecurity: No Food Insecurity   Worried About Charity fundraiser in the Last Year: Never true   Arboriculturist in the Last Year: Never true  Transportation Needs: No Transportation Needs   Lack of Transportation (Medical): No   Lack of Transportation (Non-Medical): No  Physical Activity: Sufficiently Active   Days of Exercise per Week: 5 days   Minutes of Exercise per Session: 30 min  Stress: No Stress Concern Present   Feeling of Stress : Not at all  Social Connections:  Moderately Integrated   Frequency of Communication with Friends and Family: More than three times a week   Frequency of Social Gatherings with Friends and Family: Once a week   Attends Religious Services: More than 4 times per year   Active Member of [employer] or Organizations: No   Attends Music therapist: More than 4 times per year   Marital Status: Widowed  Human resources officer Violence: Not At Risk   Fear of Current or Ex-Partner: No   Emotionally Abused: No   Physically Abused: No   Sexually Abused: No    Family History:  Family History  Problem Relation Age of Onset   Hypertension Mother    Diabetes Mother    Thyroid  disease Mother    Heart disease Mother    Kidney disease Mother    Diabetes Sister    Hypertension Brother    Stroke Brother    Heart disease Brother    Colon cancer Neg Hx    Colon polyps Neg Hx    Rectal cancer Neg Hx    Stomach cancer Neg Hx     Medications:   Current Outpatient Medications on File Prior to Visit  Medication Sig Dispense Refill   albuterol (VENTOLIN HFA) 108 (90 Base) MCG/ACT inhaler Inhale 2 puffs into the lungs every 6 (six) hours as needed for wheezing or shortness of breath. 8 g 5   amLODipine (NORVASC) 5 MG tablet TAKE 1 TABLET BY MOUTH EVERY DAY (Patient taking differently: Take 5 mg by mouth daily.) 90 tablet 3   aspirin EC 325 MG EC tablet Take 1 tablet (325 mg total) by mouth daily. 30 tablet 0   Blood Glucose Monitoring Suppl (ONE TOUCH ULTRA 2) w/Device KIT Apply 1 Device topically in the morning and at bedtime. E11.9 1 kit 0   Cholecalciferol (VITAMIN D) 50 MCG (2000 UT) tablet Take 2,000 Units by mouth daily.     clopidogrel (PLAVIX) 75 MG tablet Take 1 tablet (75 mg total) by mouth daily. 90 tablet 3   EPINEPHrine 0.3 mg/0.3 mL IJ SOAJ injection Inject 0.3 mLs (0.3 mg total) into the muscle as needed for anaphylaxis. 2 each 1   ezetimibe (ZETIA) 10 MG tablet Take 1 tablet (10 mg total) by mouth daily. 90 tablet 3    gabapentin (NEURONTIN) 400 MG capsule TAKE 1 CAPSULE BY MOUTH THREE TIMES DAILY (Patient taking differently: Take 400 mg by mouth 2 (two) times daily.) 270 capsule 1   glucose blood (ONETOUCH ULTRA) test strip USE  STRIP TO CHECK GLUCOSE TWICE DAILY AS DIRECTED 200 each 0   Insulin Pen Needle 32G X 4 MM MISC Use to inject  lantus once daily (Patient taking differently: Use to inject  lantus once daily) 100 each 4   LANTUS SOLOSTAR 100 UNIT/ML Solostar Pen INJECT 55 UNITS INTO THE SKIN DAILY. (Patient taking differently: Inject 55 Units into the skin at bedtime.) 100 mL 2   losartan (COZAAR) 100 MG tablet Take 0.5 tablets (50 mg total) by mouth daily. 90 tablet 3   meloxicam (MOBIC) 15 MG tablet Take 1 tablet (15 mg total) by mouth daily as needed for pain. 30 tablet 5   pantoprazole (PROTONIX) 40 MG tablet Take 1 tablet (40 mg total) by mouth daily. 90 tablet 3   pravastatin (PRAVACHOL) 40 MG tablet Take 1 tablet (40 mg total) by mouth daily at 6 PM. 90 tablet 3   tiZANidine (ZANAFLEX) 4 MG tablet TAKE 1 TABLET BY MOUTH EVERY 8 HOURS AS NEEDED FOR MUSCLE SPASM (Patient taking differently: Take 4 mg by mouth every 8 (eight) hours as needed for muscle spasms.) 90 tablet 5   triamcinolone (NASACORT) 55 MCG/ACT AERO nasal inhaler Place 2 sprays into the nose daily. (Patient taking differently: Place 2 sprays into the nose daily as needed (for allergies).) 3 each 3   vitamin B-12 (CYANOCOBALAMIN) 1000 MCG tablet Take 1 tablet by mouth once daily (Patient taking differently: Take 1,000 mcg by mouth daily.) 90 tablet 0   zolpidem (AMBIEN) 10 MG tablet Take 1 tablet (10 mg total) by mouth at bedtime as needed. (Patient taking differently: Take 10 mg by mouth at bedtime as needed for sleep.) 90 tablet 1   Current  Facility-Administered Medications on File Prior to Visit  Medication Dose Route Frequency Provider Last Rate Last Admin   lidocaine-EPINEPHrine (XYLOCAINE W/EPI) 1 %-1:100000 (with pres) injection 10  mL  10 mL Infiltration Once Croitoru, Mihai, MD        Allergies:   Allergies  Allergen Reactions   Contrast Media [Iodinated Diagnostic Agents] Anaphylaxis, Nausea And Vomiting, Swelling and Other (See Comments)    Patients throat closes with nausea and vomiting    Aspirin Nausea And Vomiting and Other (See Comments)    Can take EC aspirin   Gadolinium Derivatives Nausea And Vomiting   Metformin And Related Nausea And Vomiting and Other (See Comments)    Severe nausea and vomiting   Shellfish Allergy Nausea And Vomiting   Tramadol Nausea And Vomiting and Other (See Comments)    Migraines   Penicillins Nausea And Vomiting   Statins Other (See Comments)    Myalgias       OBJECTIVE:  Physical Exam  Vitals:   03/21/21 1334  BP: (!) 159/87  Pulse: 94  Weight: 156 lb (70.8 kg)  Height: $Remove'5\' 2"'WJyUGDO$  (1.575 m)   Body mass index is 28.53 kg/m. No results found.  General: well developed, well nourished, pleasant elderly AA female, seated, in no evident distress Head: head normocephalic and atraumatic.   Neck: supple with no carotid or supraclavicular bruits Cardiovascular: regular rate and rhythm, no murmurs Musculoskeletal: no deformity Skin:  no rash/petichiae Vascular:  Normal pulses all extremities   Neurologic Exam Mental Status: Awake and fully alert. Mild speech hesitancy. Oriented to place and time. Recent and remote memory intact. Attention span, concentration and fund of knowledge appropriate. Mood and affect appropriate.  Cranial Nerves: Fundoscopic exam reveals sharp disc margins. Pupils equal, briskly reactive to light. Extraocular movements full without nystagmus. Visual fields full to confrontation. Hearing intact. Facial sensation intact. Face, tongue, palate moves normally and symmetrically.  Motor: Normal bulk and tone. Normal strength in all tested extremity muscles Sensory.: intact to touch , pinprick , position and vibratory sensation.  Coordination: Rapid  alternating movements normal in all extremities. Finger-to-nose and heel-to-shin performed accurately bilaterally. Gait and Station: Arises from chair without difficulty. Stance is normal. Gait demonstrates normal stride length and balance with with use of cane.  Tandem walking heel toe not attempted. Reflexes: 1+ and symmetric. Toes downgoing.         ASSESSMENT: Tiffany [name] is a 70 y.o. year old female presented with right sided weakness on 10/06/2020 in setting of L MCA stroke likely secondary to left M2 high grade stenosis although on repeat imaging, L M2 normal likely representing artifact on prior study therefore loop recorder placed due to concern of possible atrial fibrillation as potential cause. Vascular risk factors include HTN, HLD, DM, and OSA not on CPAP     PLAN:  L MCA stroke :  Recovered well with mild right arm pain although unsure if this is post stroke or in setting of chronic pain of multiple joints - discussed use of gabapentin and muscle relaxants (currently rx'd but does not take regularly) as this may provide benefit.  These are currently managed by PCP. S/p ILR 12/25/2020 - has not shown atrial fibrillation thus far  Continue aspirin 325 mg daily and stop plavix as prolonged DAPT not indicated and continue pravastatin  for secondary stroke prevention.   Discussed secondary stroke prevention measures and importance of close PCP follow up for aggressive stroke risk factor management  HTN: BP goal <  130/90.  Stable at home per pt on current regimen per PCP HLD: LDL goal <70. Recent LDL 85 down from 159 on atorvastatin and Zetia per PCP DMII: A1c goal<7.0.  Prior A1c 7.4 monitored by PCP.  Sleep apnea: known hx previously on CPAP. Scheduled for sleep study next week. Highly recommend completing study but if she needs to reschedule due to other health related concerns and personal concerns, she can talk to check out staff today to reschedule --she verbalized  understanding     Follow up in 6 months or call earlier if needed   CC:  PCP: Biagio Borg, MD    I spent 34 minutes of face-to-face and non-face-to-face time with patient.  This included previsit chart review, lab review, study review, electronic health record documentation, patient education and discussion regarding prior stroke, secondary stroke prevention and importance of managing stroke risk factors, residual deficits, multiple joint chronic pain and answered all other questions to patient satisfaction  Frann Rider, AGNP-BC  [facility] [address] [place], [place] [address]  Phone [phone] Fax [phone] Note: This document was prepared with digital dictation and possible smart phrase technology. Any transcriptional errors that result from this process are unintentional.

## 2021-03-28 ENCOUNTER — Ambulatory Visit (INDEPENDENT_AMBULATORY_CARE_PROVIDER_SITE_OTHER): Payer: Medicare Other | Admitting: Neurology

## 2021-03-28 ENCOUNTER — Other Ambulatory Visit: Payer: Self-pay

## 2021-03-28 DIAGNOSIS — I10 Essential (primary) hypertension: Secondary | ICD-10-CM

## 2021-03-28 DIAGNOSIS — R0683 Snoring: Secondary | ICD-10-CM | POA: Diagnosis not present

## 2021-03-28 DIAGNOSIS — I63512 Cerebral infarction due to unspecified occlusion or stenosis of left middle cerebral artery: Secondary | ICD-10-CM

## 2021-03-28 DIAGNOSIS — E1165 Type 2 diabetes mellitus with hyperglycemia: Secondary | ICD-10-CM

## 2021-03-28 DIAGNOSIS — I679 Cerebrovascular disease, unspecified: Secondary | ICD-10-CM

## 2021-03-28 DIAGNOSIS — Z794 Long term (current) use of insulin: Secondary | ICD-10-CM

## 2021-03-29 DIAGNOSIS — I63512 Cerebral infarction due to unspecified occlusion or stenosis of left middle cerebral artery: Secondary | ICD-10-CM | POA: Insufficient documentation

## 2021-03-29 DIAGNOSIS — M170 Bilateral primary osteoarthritis of knee: Secondary | ICD-10-CM | POA: Diagnosis not present

## 2021-03-29 NOTE — Procedures (Signed)
PATIENT'S NAME:  Tiffany, [name] DOB:      23-Jan-1951      MR#:    [id number]     DATE OF RECORDING: 03/28/2021 REFERRING M.D.:  Frann Rider, NP Study Performed:   Baseline Polysomnogram HISTORY:   Tiffany [name] is a 70 y.o. year old female patient of the Stroke service and seen here as a referral on 01/17/2021 from Frann Rider, NP at [place], for a sleep consultation/ evaluation. Tiffany [name] has a past medical history of CVA- a Stroke just 3 months ago, on 10-06-2020, Degenerative joint disease, Diabetes mellitus, Headache, Hyperlipidemia, Hypertension. Today's date is 17 January 2021 the patient had been seen by Frann Rider, stroke nurse practitioner, on 6-23 [phone] as a 70 year old African- American right-handed female with a history remote history of being diagnosed with sleep apnea -she had not been on CPAP.  She had hypertension, hyperlipidemia diabetes mellitus type 2 with neuropathy she had degenerative disc disease and she presented in May with right-sided weakness to the hospital.  Stroke work-up showed a left sided MCA infarction likely due to a high-grade stenosis.  A 30-day cardiac monitor was recommended to rule out A. fib and the patient now has a loop recorder implanted.  Her LDL was 159 and she started on pravastatin.  Her HbA1c was 7.4 as expected for diabetes.  She was discharged home with outpatient physical therapy for rehabilitation she is now walking with a cane.  Her echocardiogram showed 65 to 70% ejection fraction which is excellent.  She was discharged on aspirin full size 325 mg and Plavix-clopidogrel 75 mg daily for 3 months so is now on aspirin alone.Her cardiologist, Dr. Sallyanne Kuster, recommended for her to stay on double therapy for now.   Patient had a previous PSG around 10 years ago, was started on CPAP and used it until it broke. Pt reports not sleeping well and has been told she stops breathing at night. Has a hard time getting and staying asleep?  The patient  endorsed the Epworth Sleepiness Scale at 1 points.   The patient's weight 157 pounds with a height of 62 (inches), resulting in a BMI of 28.8 kg/m2. The patient's neck circumference measured 13 inches.  CURRENT MEDICATIONS: Ventolin, Norvasc, Aspirin, Vitamin D, Plavix, Premarin, Epinephrine, Estrace, Zetia, Pepcid, Neurontin, Norco, Lantus Solostar, Cozaar, Mobic, Pravachol, Zanaflex, Ultram, Nasacort, Vitamin B12, Ambien   PROCEDURE:  This is a multichannel digital polysomnogram utilizing the Somnostar 11.2 system.  Electrodes and sensors were applied and monitored per AASM Specifications.   EEG, EOG, Chin and Limb EMG, were sampled at 200 Hz.  ECG, Snore and Nasal Pressure, Thermal Airflow, Respiratory Effort, CPAP Flow and Pressure, Oximetry was sampled at 50 Hz. Digital video and audio were recorded.      BASELINE STUDY: Lights Out was at 22:31 and Lights On at 05:32.  Total recording time (TRT) was 421.5 minutes, with a total sleep time (TST) of 355.5 minutes.   The patient's sleep latency was 16.5 minutes.  REM latency was 112.5 minutes.  The sleep efficiency was 84.3 %.     SLEEP ARCHITECTURE: WASO (Wake after sleep onset) was 63.5 minutes.  There were 16 minutes in Stage N1, 209 minutes Stage N2, 46.5 minutes Stage N3 and 84 minutes in Stage REM.  The percentage of Stage N1 was 4.5%, Stage N2 was 58.8%, Stage N3 was 13.1% and Stage R (REM sleep) was 23.6%.   RESPIRATORY ANALYSIS:  There were a total of 7 respiratory  events:  4 obstructive apneas, 0 central apneas and 1 mixed apnea with 2 hypopneas.     The total APNEA/HYPOPNEA INDEX (AHI) was 1.2/hour.  7 events occurred in REM sleep and 0 events in NREM. The REM AHI was  5 /hour, versus a non-REM AHI of 0. The patient spent 163 minutes of total sleep time in the supine position and 193 minutes in non-supine. The supine AHI was 2.5/h versus a non-supine AHI of 0.0. There was only mild snoring.   OXYGEN SATURATION & C02:  The Wake baseline  02 saturation was 96%, with the lowest being 92%. Time spent below 89% saturation equaled 0 minutes. The arousals were noted as: 29 were spontaneous, 0 were associated with PLMs, 2 were associated with respiratory events. The patient had a total of 0 Periodic Limb Movements Audio and video analysis did not show any abnormal or unusual movements, behaviors, phonations or vocalizations. EKG was in keeping with normal sinus rhythm (NSR).   IMPRESSION:  No evidence of sleep disordered breathing, no Obstructive Sleep Apnea (OSA) No Periodic Limb Movement Disorder (PLMD) Mild Primary Snoring Normal EKG  RECOMMENDATIONS:  No Stroke risk factors were identified in this PSG. No sleep clinic follow-up care will be needed.   I certify that I have reviewed the entire raw data recording prior to the issuance of this report in accordance with the Standards of Accreditation of the American Academy of Sleep Medicine (AASM)   Larey Seat, MD Diplomat, American Board of Psychiatry and Neurology  Diplomat, American Board of Sleep Medicine Market researcher, [employer] [facility] [employer], [employer] of Sleep Medicine

## 2021-03-29 NOTE — Progress Notes (Signed)
IMPRESSION:  1. No evidence of sleep disordered breathing, no Obstructive Sleep Apnea (OSA) 2. No Periodic Limb Movement Disorder (PLMD) 3. Mild Primary Snoring 4. Normal EKG  RECOMMENDATIONS:  No Stroke risk factors were identified in this PSG. No sleep clinic follow-up care will be needed.

## 2021-03-30 ENCOUNTER — Telehealth: Payer: Self-pay | Admitting: Internal Medicine

## 2021-03-30 DIAGNOSIS — R590 Localized enlarged lymph nodes: Secondary | ICD-10-CM

## 2021-03-30 NOTE — Telephone Encounter (Signed)
Patient says she spoke w/ someone at [facility] bc she needs mammogram done  Patient says she has a remote pacer & also has some swollen lymph nodes under both arms near breast area & that Dr. Jenny Reichmann would need to request a specific type of mammogram bc of these   Please call patient [phone]

## 2021-03-30 NOTE — Telephone Encounter (Signed)
[facility] for diagnostic mammogram

## 2021-04-06 LAB — CUP PACEART REMOTE DEVICE CHECK
Date Time Interrogation Session: 20221109185309
Implantable Pulse Generator Implant Date: 20220802

## 2021-04-09 ENCOUNTER — Other Ambulatory Visit: Payer: Self-pay | Admitting: Internal Medicine

## 2021-04-09 ENCOUNTER — Ambulatory Visit (INDEPENDENT_AMBULATORY_CARE_PROVIDER_SITE_OTHER): Payer: Medicare Other

## 2021-04-09 DIAGNOSIS — I639 Cerebral infarction, unspecified: Secondary | ICD-10-CM

## 2021-04-10 ENCOUNTER — Encounter: Payer: Self-pay | Admitting: Nurse Practitioner

## 2021-04-10 ENCOUNTER — Other Ambulatory Visit: Payer: Medicare Other

## 2021-04-10 ENCOUNTER — Ambulatory Visit (INDEPENDENT_AMBULATORY_CARE_PROVIDER_SITE_OTHER): Payer: Medicare Other | Admitting: Nurse Practitioner

## 2021-04-10 VITALS — BP 142/72 | HR 83 | Ht 62.0 in | Wt 156.2 lb

## 2021-04-10 DIAGNOSIS — R1010 Upper abdominal pain, unspecified: Secondary | ICD-10-CM

## 2021-04-10 DIAGNOSIS — R7989 Other specified abnormal findings of blood chemistry: Secondary | ICD-10-CM | POA: Diagnosis not present

## 2021-04-10 LAB — HEPATIC FUNCTION PANEL
ALT: 45 U/L — ABNORMAL HIGH (ref 0–35)
AST: 33 U/L (ref 0–37)
Albumin: 4.3 g/dL (ref 3.5–5.2)
Alkaline Phosphatase: 67 U/L (ref 39–117)
Bilirubin, Direct: 0.1 mg/dL (ref 0.0–0.3)
Total Bilirubin: 0.5 mg/dL (ref 0.2–1.2)
Total Protein: 8 g/dL (ref 6.0–8.3)

## 2021-04-10 NOTE — Progress Notes (Signed)
I agree with the above note, plan 

## 2021-04-10 NOTE — Patient Instructions (Addendum)
If you are age 69 or older, your body mass index should be between 23-30. Your Body mass index is 28.57 kg/m. If this is out of the aforementioned range listed, please consider follow up with your Primary Care Provider. ________________________________________________________  The [facility] providers would like to encourage you to use [facility] to communicate with providers for non-urgent requests or questions.  Due to long hold times on the telephone, sending your provider a message by [facility] may be a faster and more efficient way to get a response.  Please allow 48 business hours for a response.  Please remember that this is for non-urgent requests.  _______________________________________________________  Your provider has requested that you go to the basement level for lab work before leaving today. Press "B" on the elevator. The lab is located at the first door on the left as you exit the elevator.  Follow up pending the results of your labs.  Thank you for entrusting me with your care and choosing [facility].  Tye Savoy, NP-C

## 2021-04-10 NOTE — Progress Notes (Signed)
ASSESSMENT AND PLAN    # 70 yo female with several week history of epigastric /  RUQ abdominal pain radiating through to back with associated liver enzyme elevation in mid October . RUQ [place] and non-contrast CT scan unrevealing ( neg gallbladder, no cholelithiasis, normal CBD). She had similar pain when taking statins years ago. Had to restart a statin in May 2022 after CVA. It seems that her abdominal pain and liver enzyme elevation would be part of the same process but cannot exclude possibility that they are not.  --Abdominal pain not resolved but has significantly improved ( maybe PPI helping?).  Repeat hepatic function panel today.  If normalizing then will consider upper endoscopy.  --If liver enzymes not improved then consider MRCP and / or holding statin though there is some risk with that given history of CVA. --Continue Pantoprazole for now   # Chronic occasional upper abdominal pain related to spicy foods and also stress. She says the pain is related to her "stomach ulcers". We discussed that gastric ulcers are not usually chronic and not caused by stress so probably more likely that she has dyspepsia.  Not sure she has ever been found to have gastric ulcers on endoscopy. Of note, Celebrex is on med list but she hasn't yet started it and she doesn't take NSAIDs.     HISTORY OF PRESENT ILLNESS     Chief Complaint : Abdominal pain  Tiffany [name] is a 70 y.o. female with a past medical history significant for CVA, kidney stones, hyperlipidemia, hypertension, diabetes. See PMH below for any additional history.   Patient known remotely to Dr. Ardis [name] ( 2014).  She is referred by her PCP for epigastric pain  Valkyrie saw her PCP 03/09/2021 for evaluation of bloating and epigastric pain.  She was started on a PPI, labs obtained and GI referral made. The pain was epigastric and RUQ with radiation through to her mid back. Pain was nearly constant for a week prior to seeing PCP. The pain  was severe and sharp. No associated N/V or fevers.  In the past statins gave her similar pain. Her labs remarkable for AST of 157, ALT 249, alkaline phosphatase and total bilirubin were normal.  PCP sent her to ED. Repeat LFTs there showed that her AST was elevated at 165, AST was the same at 249.  Her white count was 3.3, hemoglobin 12.8.  Acute hepatitis panel negative.  Of note her LFTs have historically been normal.   RUQ ultrasound showed a normal CBD of 5 mm.  Normal-appearing gallbladder.  Noncontrast CT negative for any acute abnormalities.  Nonobstructing renal stones were found.  The pain has improved, she thinks because of the protonix. The pain is more episodic now and much less intense. BM normal. No blood in stool.   Tiffany [name] says that she has had stomach ulcers for a long time and they bother her when she eats spicy foods or gets upset.  She does not remember ever being formally diagnosed with gastric ulcers by biopsy. She doesn't take NSAIDS. She hasn't started the celebrex on home med list   PREVIOUS GI EVALUATIONS:    Colonoscopy 2014 Normal exam.  Repeat in 10 years   Past Medical History:  Diagnosis Date   Degenerative joint disease    Diabetes mellitus    Headache    Hyperlipidemia    Hypertension    Sleep apnea    stopped using it     Past  Surgical History:  Procedure Laterality Date   ABDOMINAL HYSTERECTOMY  1976   BACK SURGERY     KNEE ARTHROSCOPY     left x 2   LUMBAR LAMINECTOMY  1989   Family History  Problem Relation Age of Onset   Hypertension Mother    Diabetes Mother    Thyroid disease Mother    Heart disease Mother    Kidney disease Mother    Diabetes Sister    Hypertension Brother    Stroke Brother    Heart disease Brother    Colon cancer Neg Hx    Colon polyps Neg Hx    Rectal cancer Neg Hx    Stomach cancer Neg Hx    Social History   Tobacco Use   Smoking status: Former    Packs/day: 0.50    Years: 15.00    Pack years:  7.50    Types: Cigarettes    Quit date: 08/02/1990    Years since quitting: 30.7   Smokeless tobacco: Never  Vaping Use   Vaping Use: Never used  Substance Use Topics   Alcohol use: No    Alcohol/week: 0.0 standard drinks   Drug use: No   Current Outpatient Medications  Medication Sig Dispense Refill   albuterol (VENTOLIN HFA) 108 (90 Base) MCG/ACT inhaler Inhale 2 puffs into the lungs every 6 (six) hours as needed for wheezing or shortness of breath. 8 g 5   amLODipine (NORVASC) 5 MG tablet TAKE 1 TABLET BY MOUTH EVERY DAY (Patient taking differently: Take 5 mg by mouth daily.) 90 tablet 3   aspirin EC 325 MG EC tablet Take 1 tablet (325 mg total) by mouth daily. 30 tablet 0   Blood Glucose Monitoring Suppl (ONE TOUCH ULTRA 2) w/Device KIT Apply 1 Device topically in the morning and at bedtime. E11.9 1 kit 0   Cholecalciferol (VITAMIN D) 50 MCG (2000 UT) tablet Take 2,000 Units by mouth daily.     EPINEPHrine 0.3 mg/0.3 mL IJ SOAJ injection Inject 0.3 mLs (0.3 mg total) into the muscle as needed for anaphylaxis. 2 each 1   ezetimibe (ZETIA) 10 MG tablet Take 1 tablet (10 mg total) by mouth daily. 90 tablet 3   gabapentin (NEURONTIN) 400 MG capsule TAKE 1 CAPSULE BY MOUTH THREE TIMES DAILY (Patient taking differently: Take 400 mg by mouth 2 (two) times daily.) 270 capsule 1   glucose blood (ONETOUCH ULTRA) test strip USE  STRIP TO CHECK GLUCOSE TWICE DAILY AS DIRECTED 200 each 0   Insulin Pen Needle 32G X 4 MM MISC Use to inject  lantus once daily (Patient taking differently: Use to inject  lantus once daily) 100 each 4   LANTUS SOLOSTAR 100 UNIT/ML Solostar Pen INJECT 55 UNITS INTO THE SKIN DAILY. (Patient taking differently: Inject 55 Units into the skin at bedtime.) 100 mL 2   losartan (COZAAR) 100 MG tablet Take 0.5 tablets (50 mg total) by mouth daily. 90 tablet 3   pantoprazole (PROTONIX) 40 MG tablet Take 1 tablet (40 mg total) by mouth daily. 90 tablet 3   pravastatin (PRAVACHOL) 40  MG tablet Take 1 tablet (40 mg total) by mouth daily at 6 PM. 90 tablet 3   tiZANidine (ZANAFLEX) 4 MG tablet TAKE 1 TABLET BY MOUTH EVERY 8 HOURS AS NEEDED FOR MUSCLE SPASM 90 tablet 1   triamcinolone (NASACORT) 55 MCG/ACT AERO nasal inhaler Place 2 sprays into the nose daily. (Patient taking differently: Place 2 sprays into the nose daily  as needed (for allergies).) 3 each 3   vitamin B-12 (CYANOCOBALAMIN) 1000 MCG tablet Take 1 tablet by mouth once daily 90 tablet 3   zolpidem (AMBIEN) 10 MG tablet TAKE 1 TABLET BY MOUTH AT BEDTIME AS NEEDED 90 tablet 1   celecoxib (CELEBREX) 200 MG capsule Take 200 mg by mouth 2 (two) times daily.     Current Facility-Administered Medications  Medication Dose Route Frequency Provider Last Rate Last Admin   lidocaine-EPINEPHrine (XYLOCAINE W/EPI) 1 %-1:100000 (with pres) injection 10 mL  10 mL Infiltration Once Croitoru, Mihai, MD       Allergies  Allergen Reactions   Contrast Media [Iodinated Diagnostic Agents] Anaphylaxis, Nausea And Vomiting, Swelling and Other (See Comments)    Patients throat closes with nausea and vomiting    Aspirin Nausea And Vomiting and Other (See Comments)    Can take EC aspirin   Gadolinium Derivatives Nausea And Vomiting   Metformin And Related Nausea And Vomiting and Other (See Comments)    Severe nausea and vomiting   Shellfish Allergy Nausea And Vomiting   Tramadol Nausea And Vomiting and Other (See Comments)    Migraines   Penicillins Nausea And Vomiting   Statins Other (See Comments)    Myalgias      Review of Systems: Positive for arthritis, back pain, allergy, sinus trouble, sleeping problems, swollen lymph nodes.  All other systems reviewed and negative except where noted in HPI.    PHYSICAL EXAM :    Wt Readings from Last 3 Encounters:  04/10/21 156 lb 3.2 oz (70.9 kg)  03/21/21 156 lb (70.8 kg)  03/09/21 154 lb 5.2 oz (70 kg)    BP (!) 142/72   Pulse 83   Ht _0  (1.575 m)   Wt 156 lb 3.2 oz  (70.9 kg)   BMI 28.57 kg/m  Constitutional:  Generally well appearing female in no acute distress. Psychiatric: Pleasant. Normal mood and affect. Behavior is normal. EENT: Pupils normal.  Conjunctivae are normal. No scleral icterus. Neck supple.  Cardiovascular: Normal rate, regular rhythm. No edema Pulmonary/chest: Effort normal and breath sounds normal. No wheezing, rales or rhonchi. Abdominal: Soft, nondistended, mild -moderate epigastric tenderness. Bowel sounds active throughout. There are no masses palpable. No hepatomegaly. Neurological: Alert and oriented to person place and time. Skin: Skin is warm and dry. No rashes noted.  Tye Savoy, NP  04/10/2021, 10:42 AM  Cc:  Referring Provider Biagio Borg, MD

## 2021-04-11 ENCOUNTER — Encounter: Payer: Self-pay | Admitting: Neurology

## 2021-04-12 ENCOUNTER — Telehealth: Payer: Self-pay | Admitting: Nurse Practitioner

## 2021-04-12 ENCOUNTER — Other Ambulatory Visit: Payer: Self-pay | Admitting: Internal Medicine

## 2021-04-12 DIAGNOSIS — R590 Localized enlarged lymph nodes: Secondary | ICD-10-CM

## 2021-04-12 NOTE — Telephone Encounter (Signed)
Patient returned your call.  Please call her back at your convenience.  Thank you.

## 2021-04-13 ENCOUNTER — Other Ambulatory Visit: Payer: Self-pay

## 2021-04-13 DIAGNOSIS — R1010 Upper abdominal pain, unspecified: Secondary | ICD-10-CM

## 2021-04-13 NOTE — Telephone Encounter (Signed)
Spoke with the patient. She is agreeable to this plan. She will contact her family member to arrange her care partner. She is hoping for 04/27/21. If not she will wait until 05/30/21.  Agrees to let me know today. We will go over the instructions on the telephone. Her visit in office has been less than 30 days. Per protocol, she does not require a pre-visit. Patient is insulin dependent diabetic. No home oxygen. Echo of 10/07/20 shows EF 65 to 70%. Stroke 10/06/20. No longer on Plavix as of 03/21/21.

## 2021-04-13 NOTE — Telephone Encounter (Addendum)
Spoke with the patient. She has a care partner for 04/27/21. She uses My Chart. She will look for her instructions in My Chart. I will mail a copy to her as well though it may not arrive in time. Confirmed with her the date of 04/27/21 arrive at 2:00 pm. Reiterated she must fast for 3 hours prior to her procedure after having only clear liquids all day.

## 2021-04-17 NOTE — Progress Notes (Signed)
Carelink Summary Report / Loop Recorder 

## 2021-04-24 ENCOUNTER — Other Ambulatory Visit: Payer: Self-pay | Admitting: Internal Medicine

## 2021-04-27 ENCOUNTER — Ambulatory Visit (AMBULATORY_SURGERY_CENTER): Payer: Medicare Other | Admitting: Gastroenterology

## 2021-04-27 ENCOUNTER — Other Ambulatory Visit: Payer: Self-pay

## 2021-04-27 ENCOUNTER — Encounter: Payer: Self-pay | Admitting: Gastroenterology

## 2021-04-27 VITALS — BP 121/83 | HR 74 | Temp 96.0°F | Resp 13 | Ht 62.0 in | Wt 156.0 lb

## 2021-04-27 DIAGNOSIS — K2950 Unspecified chronic gastritis without bleeding: Secondary | ICD-10-CM | POA: Diagnosis not present

## 2021-04-27 DIAGNOSIS — K2970 Gastritis, unspecified, without bleeding: Secondary | ICD-10-CM

## 2021-04-27 DIAGNOSIS — R1010 Upper abdominal pain, unspecified: Secondary | ICD-10-CM | POA: Diagnosis not present

## 2021-04-27 DIAGNOSIS — R109 Unspecified abdominal pain: Secondary | ICD-10-CM | POA: Diagnosis not present

## 2021-04-27 MED ORDER — SODIUM CHLORIDE 0.9 % IV SOLN: 500.0000 mL | Freq: Once | INTRAVENOUS | Status: DC

## 2021-04-27 NOTE — Op Note (Signed)
[address] Patient Name: Tiffany [name] Procedure Date: 04/27/2021 2:42 PM MRN: [id number] Endoscopist: Milus Banister , MD Age: 70 Referring MD:  Date of Birth: Jul 07, 1950 Gender: Female Account #: [id number] Procedure:                Upper GI endoscopy Indications:              Epigastric abdominal pain, Abdominal pain in the                            right upper quadrant Medicines:                Monitored Anesthesia Care Procedure:                Pre-Anesthesia Assessment:                           - Prior to the procedure, a History and Physical                            was performed, and patient medications and                            allergies were reviewed. The patient's tolerance of                            previous anesthesia was also reviewed. The risks                            and benefits of the procedure and the sedation                            options and risks were discussed with the patient.                            All questions were answered, and informed consent                            was obtained. Prior Anticoagulants: The patient has                            taken no previous anticoagulant or antiplatelet                            agents. ASA Grade Assessment: II - A patient with                            mild systemic disease. After reviewing the risks                            and benefits, the patient was deemed in                            satisfactory condition to undergo the procedure.  After obtaining informed consent, the endoscope was                            passed under direct vision. Throughout the                            procedure, the patient's blood pressure, pulse, and                            oxygen saturations were monitored continuously. The                            #[id number] Olympus Endoscope was introduced through the                            mouth, and advanced to the second  part of duodenum.                            The upper GI endoscopy was accomplished without                            difficulty. The patient tolerated the procedure                            well. Scope In: Scope Out: Findings:                 Moderate inflammation characterized by erythema,                            friability and granularity was found in the entire                            examined stomach. Biopsies were taken with a cold                            forceps for histology.                           The exam was otherwise without abnormality. Complications:            No immediate complications. Estimated blood loss:                            None. Estimated Blood Loss:     Estimated blood loss: none. Impression:               - Moderate, non-specific pangasrititis. Biopsied to                            check for H. pylori                           - The examination was otherwise normal. Recommendation:           - Patient has a contact number available for  emergencies. The signs and symptoms of potential                            delayed complications were discussed with the                            patient. Return to normal activities tomorrow.                            Written discharge instructions were provided to the                            patient.                           - Resume previous diet.                           - Continue present medications.                           - Await pathology results. Milus Banister, MD 04/27/2021 3:03:34 PM This report has been signed electronically.

## 2021-04-27 NOTE — Patient Instructions (Signed)
Handout provided on gastritis.   YOU HAD AN ENDOSCOPIC PROCEDURE TODAY AT [facility]:   Refer to the procedure report that was given to you for any specific questions about what was found during the examination.  If the procedure report does not answer your questions, please call your gastroenterologist to clarify.  If you requested that your care partner not be given the details of your procedure findings, then the procedure report has been included in a sealed envelope for you to review at your convenience later.  YOU SHOULD EXPECT: Some feelings of bloating in the abdomen. Passage of more gas than usual.  Walking can help get rid of the air that was put into your GI tract during the procedure and reduce the bloating. If you had a lower endoscopy (such as a colonoscopy or flexible sigmoidoscopy) you may notice spotting of blood in your stool or on the toilet paper. If you underwent a bowel prep for your procedure, you may not have a normal bowel movement for a few days.  Please Note:  You might notice some irritation and congestion in your nose or some drainage.  This is from the oxygen used during your procedure.  There is no need for concern and it should clear up in a day or so.  SYMPTOMS TO REPORT IMMEDIATELY:  Following upper endoscopy (EGD)  Vomiting of blood or coffee ground material  New chest pain or pain under the shoulder blades  Painful or persistently difficult swallowing  New shortness of breath  Fever of 100F or higher  Black, tarry-looking stools  For urgent or emergent issues, a gastroenterologist can be reached at any hour by calling [phone]. Do not use MyChart messaging for urgent concerns.    DIET:  We do recommend a small meal at first, but then you may proceed to your regular diet.  Drink plenty of fluids but you should avoid alcoholic beverages for 24 hours.  ACTIVITY:  You should plan to take it easy for the rest of today and you should NOT  DRIVE or use heavy machinery until tomorrow (because of the sedation medicines used during the test).    FOLLOW UP: Our staff will call the number listed on your records 48-72 hours following your procedure to check on you and address any questions or concerns that you may have regarding the information given to you following your procedure. If we do not reach you, we will leave a message.  We will attempt to reach you two times.  During this call, we will ask if you have developed any symptoms of COVID 19. If you develop any symptoms (ie: fever, flu-like symptoms, shortness of breath, cough etc.) before then, please call [phone].  If you test positive for Covid 19 in the 2 weeks post procedure, please call and report this information to [place].    If any biopsies were taken you will be contacted by phone or by letter within the next 1-3 weeks.  Please call us at [phone] if you have not heard about the biopsies in 3 weeks.    SIGNATURES/CONFIDENTIALITY: You and/or your care partner have signed paperwork which will be entered into your electronic medical record.  These signatures attest to the fact that that the information above on your After Visit Summary has been reviewed and is understood.  Full responsibility of the confidentiality of this discharge information lies with you and/or your care-partner.

## 2021-04-27 NOTE — Progress Notes (Signed)
Called to room to assist during endoscopic procedure.  Patient ID and intended procedure confirmed with present staff. Received instructions for my participation in the procedure from the performing physician.  

## 2021-04-27 NOTE — Progress Notes (Signed)
To pacu, VSS. Report to [email or web address] 

## 2021-04-27 NOTE — Progress Notes (Signed)
Pt's states no medical or surgical changes since previsit or office visit. VS assessed by C.W 

## 2021-04-27 NOTE — Progress Notes (Signed)
  The recent H&P (dated 04/10/2021) was reviewed, the patient was examined and there is no change in the patients condition since that H&P was completed.   Tiffany [name]  04/27/2021, 2:18 PM

## 2021-05-01 ENCOUNTER — Telehealth: Payer: Self-pay

## 2021-05-01 NOTE — Telephone Encounter (Signed)
  Follow up Call-  Call back number 04/27/2021  Post procedure Call Back phone  # [phone]  Permission to leave phone message Yes  Some recent data might be hidden    Attempted f/u call, no answer, left msg.

## 2021-05-02 ENCOUNTER — Other Ambulatory Visit: Payer: Self-pay | Admitting: Internal Medicine

## 2021-05-02 ENCOUNTER — Ambulatory Visit
Admission: RE | Admit: 2021-05-02 | Discharge: 2021-05-02 | Disposition: A | Discharge status: Home / Self Care | Payer: Medicare Other | Source: Ambulatory Visit | Attending: Internal Medicine | Admitting: Internal Medicine

## 2021-05-02 DIAGNOSIS — R590 Localized enlarged lymph nodes: Secondary | ICD-10-CM

## 2021-05-02 DIAGNOSIS — N644 Mastodynia: Secondary | ICD-10-CM | POA: Diagnosis not present

## 2021-05-02 DIAGNOSIS — R928 Other abnormal and inconclusive findings on diagnostic imaging of breast: Secondary | ICD-10-CM | POA: Diagnosis not present

## 2021-05-02 DIAGNOSIS — N6489 Other specified disorders of breast: Secondary | ICD-10-CM | POA: Diagnosis not present

## 2021-05-03 ENCOUNTER — Encounter: Payer: Self-pay | Admitting: Gastroenterology

## 2021-05-08 ENCOUNTER — Telehealth: Payer: Self-pay | Admitting: Gastroenterology

## 2021-05-08 NOTE — Telephone Encounter (Signed)
Inbound call from patient, states that she still having issues with stomach hurting after her procedure. Requesting a call back, please advise.

## 2021-05-08 NOTE — Telephone Encounter (Signed)
The pt had EGD on 12/2 for abd pain.  She was told if she continued to have abd pain she should call and make a follow up appt.  She says that the pain is no worse but no better. She is taking protonix daily and pepcid at bedtime.  I have scheduled her an appt for 06/12/21 with Dr Ardis Hughs. She will follow anti reflux precautions until her appt

## 2021-05-14 ENCOUNTER — Other Ambulatory Visit: Payer: Medicare Other

## 2021-05-14 ENCOUNTER — Ambulatory Visit (INDEPENDENT_AMBULATORY_CARE_PROVIDER_SITE_OTHER): Payer: Medicare Other

## 2021-05-14 DIAGNOSIS — I639 Cerebral infarction, unspecified: Secondary | ICD-10-CM | POA: Diagnosis not present

## 2021-05-15 LAB — CUP PACEART REMOTE DEVICE CHECK
Date Time Interrogation Session: 20221212185221
Implantable Pulse Generator Implant Date: 20220802

## 2021-05-23 NOTE — Progress Notes (Signed)
Carelink Summary Report / Loop Recorder 

## 2021-05-30 ENCOUNTER — Encounter: Payer: Medicare Other | Admitting: Gastroenterology

## 2021-06-04 ENCOUNTER — Other Ambulatory Visit: Payer: Self-pay | Admitting: Internal Medicine

## 2021-06-12 ENCOUNTER — Ambulatory Visit: Payer: Medicare Other | Admitting: Gastroenterology

## 2021-06-18 ENCOUNTER — Ambulatory Visit (INDEPENDENT_AMBULATORY_CARE_PROVIDER_SITE_OTHER): Payer: Medicare Other

## 2021-06-18 DIAGNOSIS — I639 Cerebral infarction, unspecified: Secondary | ICD-10-CM

## 2021-06-18 LAB — CUP PACEART REMOTE DEVICE CHECK
Date Time Interrogation Session: 20230122230734
Implantable Pulse Generator Implant Date: 20220802

## 2021-06-29 NOTE — Progress Notes (Signed)
Carelink Summary Report / Loop Recorder 

## 2021-07-19 ENCOUNTER — Other Ambulatory Visit: Payer: Self-pay | Admitting: Internal Medicine

## 2021-07-22 ENCOUNTER — Other Ambulatory Visit: Payer: Self-pay | Admitting: Internal Medicine

## 2021-07-22 LAB — CUP PACEART REMOTE DEVICE CHECK
Date Time Interrogation Session: 20230224230604
Implantable Pulse Generator Implant Date: 20220802

## 2021-07-22 NOTE — Telephone Encounter (Signed)
Please refill as per office routine med refill policy (all routine meds to be refilled for 3 mo or monthly (per pt preference) up to one year from last visit, then month to month grace period for 3 mo, then further med refills will have to be denied) ? ?

## 2021-07-23 ENCOUNTER — Ambulatory Visit (INDEPENDENT_AMBULATORY_CARE_PROVIDER_SITE_OTHER): Payer: Medicare Other

## 2021-07-23 DIAGNOSIS — I639 Cerebral infarction, unspecified: Secondary | ICD-10-CM

## 2021-07-25 ENCOUNTER — Other Ambulatory Visit: Payer: Self-pay | Admitting: Internal Medicine

## 2021-07-25 NOTE — Telephone Encounter (Signed)
Please refill as per office routine med refill policy (all routine meds to be refilled for 3 mo or monthly (per pt preference) up to one year from last visit, then month to month grace period for 3 mo, then further med refills will have to be denied) ? ?

## 2021-07-26 ENCOUNTER — Telehealth: Payer: Self-pay

## 2021-07-26 ENCOUNTER — Other Ambulatory Visit: Payer: Self-pay | Admitting: *Deleted

## 2021-07-26 MED ORDER — PRAVASTATIN SODIUM 40 MG PO TABS: 40.0000 mg | ORAL_TABLET | Freq: Every day | ORAL | 2 refills | Status: DC

## 2021-07-26 MED ORDER — LANTUS SOLOSTAR 100 UNIT/ML ~~LOC~~ SOPN: 55.0000 [IU] | PEN_INJECTOR | Freq: Every day | SUBCUTANEOUS | 3 refills | Status: DC

## 2021-07-26 NOTE — Telephone Encounter (Signed)
PT is requesting a refill on: ?LANTUS SOLOSTAR 100 UNIT/ML Solostar Pen ?pravastatin (PRAVACHOL) 40 MG tablet ? ?Pharmacy: ?[id number] - , [place] [address] ? ?gabapentin (NEURONTIN) 400 MG capsule ?Pt is requesting a doses increase. ? ?Pharmacy: ?[place], [place] ? ? ?

## 2021-07-26 NOTE — Telephone Encounter (Signed)
Patient called and requesting a refill of her gabapentin (NEURONTIN) 400 MG capsule. ? ?Please advise  ? ?

## 2021-07-27 NOTE — Progress Notes (Signed)
Carelink Summary Report / Loop Recorder 

## 2021-08-03 ENCOUNTER — Ambulatory Visit (INDEPENDENT_AMBULATORY_CARE_PROVIDER_SITE_OTHER): Payer: Medicare Other

## 2021-08-03 DIAGNOSIS — Z0100 Encounter for examination of eyes and vision without abnormal findings: Secondary | ICD-10-CM

## 2021-08-03 DIAGNOSIS — Z0000 Encounter for general adult medical examination without abnormal findings: Secondary | ICD-10-CM | POA: Diagnosis not present

## 2021-08-03 NOTE — Patient Instructions (Signed)
Tiffany [name] , Thank you for taking time to come for your Medicare Wellness Visit. I appreciate your ongoing commitment to your health goals. Please review the following plan we discussed and let me know if I can assist you in the future.   Screening recommendations/referrals: Colonoscopy: 07/22/2012  due 2024 Mammogram: 05/02/2021 Bone Density: 04/03/2017 Recommended yearly ophthalmology/optometry visit for glaucoma screening and checkup Recommended yearly dental visit for hygiene and checkup  Vaccinations: Influenza vaccine: completed  Pneumococcal vaccine: completed  Tdap vaccine: 05/03/2019 Shingles vaccine: will consider     Advanced directives: none   Conditions/risks identified: none   Next appointment: none    Preventive Care 34 Years and Older, Female Preventive care refers to lifestyle choices and visits with your health care provider that can promote health and wellness. What does preventive care include? A yearly physical exam. This is also called an annual well check. Dental exams once or twice a year. Routine eye exams. Ask your health care provider how often you should have your eyes checked. Personal lifestyle choices, including: Daily care of your teeth and gums. Regular physical activity. Eating a healthy diet. Avoiding tobacco and drug use. Limiting alcohol use. Practicing safe sex. Taking low-dose aspirin every day. Taking vitamin and mineral supplements as recommended by your health care provider. What happens during an annual well check? The services and screenings done by your health care provider during your annual well check will depend on your age, overall health, lifestyle risk factors, and family history of disease. Counseling  Your health care provider may ask you questions about your: Alcohol use. Tobacco use. Drug use. Emotional well-being. Home and relationship well-being. Sexual activity. Eating habits. History of falls. Memory and  ability to understand (cognition). Work and work Statistician. Reproductive health. Screening  You may have the following tests or measurements: Height, weight, and BMI. Blood pressure. Lipid and cholesterol levels. These may be checked every 5 years, or more frequently if you are over 50 years old. Skin check. Lung cancer screening. You may have this screening every year starting at age 16 if you have a 30-pack-year history of smoking and currently smoke or have quit within the past 15 years. Fecal occult blood test (FOBT) of the stool. You may have this test every year starting at age 43. Flexible sigmoidoscopy or colonoscopy. You may have a sigmoidoscopy every 5 years or a colonoscopy every 10 years starting at age 39. Hepatitis C blood test. Hepatitis B blood test. Sexually transmitted disease (STD) testing. Diabetes screening. This is done by checking your blood sugar (glucose) after you have not eaten for a while (fasting). You may have this done every 1-3 years. Bone density scan. This is done to screen for osteoporosis. You may have this done starting at age 17. Mammogram. This may be done every 1-2 years. Talk to your health care provider about how often you should have regular mammograms. Talk with your health care provider about your test results, treatment options, and if necessary, the need for more tests. Vaccines  Your health care provider may recommend certain vaccines, such as: Influenza vaccine. This is recommended every year. Tetanus, diphtheria, and acellular pertussis (Tdap, Td) vaccine. You may need a Td booster every 10 years. Zoster vaccine. You may need this after age 35. Pneumococcal 13-valent conjugate (PCV13) vaccine. One dose is recommended after age 31. Pneumococcal polysaccharide (PPSV23) vaccine. One dose is recommended after age 56. Talk to your health care provider about which screenings and vaccines you need  and how often you need them. This information is  not intended to replace advice given to you by your health care provider. Make sure you discuss any questions you have with your health care provider. Document Released: 06/09/2015 Document Revised: 01/31/2016 Document Reviewed: 03/14/2015 Elsevier Interactive Patient Education  2017 [place] Prevention in the Home Falls can cause injuries. They can happen to people of all ages. There are many things you can do to make your home safe and to help prevent falls. What can I do on the outside of my home? Regularly fix the edges of walkways and driveways and fix any cracks. Remove anything that might make you trip as you walk through a door, such as a raised step or threshold. Trim any bushes or trees on the path to your home. Use bright outdoor lighting. Clear any walking paths of anything that might make someone trip, such as rocks or tools. Regularly check to see if handrails are loose or broken. Make sure that both sides of any steps have handrails. Any raised decks and porches should have guardrails on the edges. Have any leaves, snow, or ice cleared regularly. Use sand or salt on walking paths during winter. Clean up any spills in your garage right away. This includes oil or grease spills. What can I do in the bathroom? Use night lights. Install grab bars by the toilet and in the tub and shower. Do not use towel bars as grab bars. Use non-skid mats or decals in the tub or shower. If you need to sit down in the shower, use a plastic, non-slip stool. Keep the floor dry. Clean up any water that spills on the floor as soon as it happens. Remove soap buildup in the tub or shower regularly. Attach bath mats securely with double-sided non-slip rug tape. Do not have throw rugs and other things on the floor that can make you trip. What can I do in the bedroom? Use night lights. Make sure that you have a light by your bed that is easy to reach. Do not use any sheets or blankets that  are too big for your bed. They should not hang down onto the floor. Have a firm chair that has side arms. You can use this for support while you get dressed. Do not have throw rugs and other things on the floor that can make you trip. What can I do in the kitchen? Clean up any spills right away. Avoid walking on wet floors. Keep items that you use a lot in easy-to-reach places. If you need to reach something above you, use a strong step stool that has a grab bar. Keep electrical cords out of the way. Do not use floor polish or wax that makes floors slippery. If you must use wax, use non-skid floor wax. Do not have throw rugs and other things on the floor that can make you trip. What can I do with my stairs? Do not leave any items on the stairs. Make sure that there are handrails on both sides of the stairs and use them. Fix handrails that are broken or loose. Make sure that handrails are as long as the stairways. Check any carpeting to make sure that it is firmly attached to the stairs. Fix any carpet that is loose or worn. Avoid having throw rugs at the top or bottom of the stairs. If you do have throw rugs, attach them to the floor with carpet tape. Make sure that you  have a light switch at the top of the stairs and the bottom of the stairs. If you do not have them, ask someone to add them for you. What else can I do to help prevent falls? Wear shoes that: Do not have high heels. Have rubber bottoms. Are comfortable and fit you well. Are closed at the toe. Do not wear sandals. If you use a stepladder: Make sure that it is fully opened. Do not climb a closed stepladder. Make sure that both sides of the stepladder are locked into place. Ask someone to hold it for you, if possible. Clearly mark and make sure that you can see: Any grab bars or handrails. First and last steps. Where the edge of each step is. Use tools that help you move around (mobility aids) if they are needed. These  include: Canes. Walkers. Scooters. Crutches. Turn on the lights when you go into a dark area. Replace any light bulbs as soon as they burn out. Set up your furniture so you have a clear path. Avoid moving your furniture around. If any of your floors are uneven, fix them. If there are any pets around you, be aware of where they are. Review your medicines with your doctor. Some medicines can make you feel dizzy. This can increase your chance of falling. Ask your doctor what other things that you can do to help prevent falls. This information is not intended to replace advice given to you by your health care provider. Make sure you discuss any questions you have with your health care provider. Document Released: 03/09/2009 Document Revised: 10/19/2015 Document Reviewed: 06/17/2014 Elsevier Interactive Patient Education  2017 [employer].

## 2021-08-03 NOTE — Progress Notes (Signed)
Subjective:   Tiffany [name] is a 71 y.o. female who presents for Medicare Annual (Subsequent) preventive examination.  I connected with Tiffany [name]  today by telephone and verified that I am speaking with the correct person using two identifiers. Location patient: home Location provider: work Persons participating in the virtual visit: patient, provider.   I discussed the limitations, risks, security and privacy concerns of performing an evaluation and management service by telephone and the availability of in person appointments. I also discussed with the patient that there may be a patient responsible charge related to this service. The patient expressed understanding and verbally consented to this telephonic visit.    Interactive audio and video telecommunications were attempted between this provider and patient, however failed, due to patient having technical difficulties OR patient did not have access to video capability.  We continued and completed visit with audio only.    Review of Systems     Cardiac Risk Factors include: advanced age (>73mn, >>64women);diabetes mellitus;dyslipidemia;hypertension     Objective:    Today's Vitals   There is no height or weight on file to calculate BMI.  Advanced Directives 08/03/2021 10/24/2020 10/18/2020 10/06/2020 10/06/2020 07/20/2020 04/16/2019  Does Patient Have a Medical Advance Directive? _0  No No  Would patient like information on creating a medical advance directive? No - Patient declined No - Patient declined No - Patient declined No - Patient declined No - Patient declined No - Patient declined Yes (ED - Information included in AVS)    Current Medications (verified) Outpatient Encounter Medications as of 08/03/2021  Medication Sig   Accu-Chek Softclix Lancets lancets USE   TO CHECK GLUCOSE IN THE MORNING AND AT BEDTIME   albuterol (VENTOLIN HFA) 108 (90 Base) MCG/ACT inhaler Inhale 2 puffs into the lungs every 6 (six)  hours as needed for wheezing or shortness of breath.   amLODipine (NORVASC) 5 MG tablet TAKE 1 TABLET BY MOUTH EVERY DAY   aspirin EC 325 MG EC tablet Take 1 tablet (325 mg total) by mouth daily.   Blood Glucose Monitoring Suppl (ONE TOUCH ULTRA 2) w/Device KIT Apply 1 Device topically in the morning and at bedtime. E11.9   celecoxib (CELEBREX) 200 MG capsule Take 200 mg by mouth 2 (two) times daily.   Cholecalciferol (VITAMIN D) 50 MCG (2000 UT) tablet Take 2,000 Units by mouth daily.   EPINEPHrine 0.3 mg/0.3 mL IJ SOAJ injection Inject 0.3 mLs (0.3 mg total) into the muscle as needed for anaphylaxis.   ezetimibe (ZETIA) 10 MG tablet Take 1 tablet (10 mg total) by mouth daily.   gabapentin (NEURONTIN) 400 MG capsule TAKE 1 CAPSULE BY MOUTH THREE TIMES DAILY   insulin glargine (LANTUS SOLOSTAR) 100 UNIT/ML Solostar Pen Inject 55 Units into the skin at bedtime.   Insulin Pen Needle 32G X 4 MM MISC Use to inject  lantus once daily (Patient taking differently: Use to inject  lantus once daily)   losartan (COZAAR) 100 MG tablet Take 0.5 tablets (50 mg total) by mouth daily.   ONETOUCH ULTRA test strip USE  STRIP TO CHECK GLUCOSE TWICE DAILY AS DIRECTED   pantoprazole (PROTONIX) 40 MG tablet Take 1 tablet (40 mg total) by mouth daily.   pravastatin (PRAVACHOL) 40 MG tablet Take 1 tablet (40 mg total) by mouth daily at 6 PM.   tiZANidine (ZANAFLEX) 4 MG tablet TAKE 1 TABLET BY MOUTH EVERY 8 HOURS AS NEEDED FOR MUSCLE SPASM   triamcinolone (  NASACORT) 55 MCG/ACT AERO nasal inhaler Place 2 sprays into the nose daily.   vitamin B-12 (CYANOCOBALAMIN) 1000 MCG tablet Take 1 tablet by mouth once daily   zolpidem (AMBIEN) 10 MG tablet TAKE 1 TABLET BY MOUTH AT BEDTIME AS NEEDED   Facility-Administered Encounter Medications as of 08/03/2021  Medication   lidocaine-EPINEPHrine (XYLOCAINE W/EPI) 1 %-1:100000 (with pres) injection 10 mL    Allergies (verified) Contrast media [iodinated contrast media],  Aspirin, Gadolinium derivatives, Metformin and related, Shellfish allergy, Tramadol, Penicillins, and Statins   History: Past Medical History:  Diagnosis Date   Allergy    Degenerative joint disease    Diabetes mellitus    GERD (gastroesophageal reflux disease)    Headache    Hyperlipidemia    Hypertension    Stroke [facility])    Past Surgical History:  Procedure Laterality Date   ABDOMINAL HYSTERECTOMY  1976   BACK SURGERY     KNEE ARTHROSCOPY     left x 2   LUMBAR LAMINECTOMY  1989   Family History  Problem Relation Age of Onset   Hypertension Mother    Diabetes Mother    Thyroid disease Mother    Heart disease Mother    Kidney disease Mother    Diabetes Sister    Hypertension Brother    Stroke Brother    Heart disease Brother    Colon cancer Neg Hx    Colon polyps Neg Hx    Rectal cancer Neg Hx    Stomach cancer Neg Hx    Social History   Socioeconomic History   Marital status: Single    Spouse name: Not on file   Number of children: 2   Years of education: 8   Highest education level: 8th grade  Occupational History   Occupation: Disability    Comment: retired day Runner, broadcasting/film/video  Tobacco Use   Smoking status: Former    Packs/day: 0.50    Years: 15.00    Pack years: 7.50    Types: Cigarettes    Quit date: 08/02/1990    Years since quitting: 31.0   Smokeless tobacco: Never  Vaping Use   Vaping Use: Never used  Substance and Sexual Activity   Alcohol use: No    Alcohol/week: 0.0 standard drinks   Drug use: No   Sexual activity: Never  Other Topics Concern   Not on file  Social History Narrative   Born in [place], [place].   Completed the 8th grade   Fun: Walk, crochet, puzzles   Diet - Variety of foods, does not eat breakfast   2 children - 44 and 45    6 grandchildren - 7 great grandchildren.    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   Right handed    Caffeine: 1/2 cup of coffee, rarely    Social Determinants of Systems developer Strain: Low Risk    Difficulty of Paying Living Expenses: Not hard at all  Food Insecurity: No Food Insecurity   Worried About Charity fundraiser in the Last Year: Never true   Arboriculturist in the Last Year: Never true  Transportation Needs: No Transportation Needs   Lack of Transportation (Medical): No   Lack of Transportation (Non-Medical): No  Physical Activity: Insufficiently Active   Days of Exercise per Week: 2 days   Minutes of Exercise per Session: 20 min  Stress: No Stress Concern Present   Feeling of Stress : Not at all  Social Connections: Socially Isolated   Frequency of Communication with Friends and Family: Twice a week   Frequency of Social Gatherings with Friends and Family: Twice a week   Attends Religious Services: Never   Printmaker: No   Attends Music therapist: Never   Marital Status: Divorced    Tobacco Counseling Counseling given: Not Answered   Clinical Intake:  Pre-visit preparation completed: Yes  Pain : No/denies pain     Nutritional Risks: None Diabetes: No  How often do you need to have someone help you when you read instructions, pamphlets, or other written materials from your doctor or pharmacy?: 2 - Rarely What is the last grade level you completed in school?: 8 th grade  Diabetic?ys Nutrition Risk Assessment:  Has the patient had any N/V/D within the last 2 months?  No  Does the patient have any non-healing wounds?  No  Has the patient had any unintentional weight loss or weight gain?  No   Diabetes:  Is the patient diabetic?  Yes  If diabetic, was a CBG obtained today?  No  Did the patient bring in their glucometer from home?  No  How often do you monitor your CBG's? 3 x day .   Financial Strains and Diabetes Management:  Are you having any financial strains with the device, your supplies or your medication? No .  Does the patient want to be seen by Chronic Care  Management for management of their diabetes?  No  Would the patient like to be referred to a Nutritionist or for Diabetic Management?  No   Diabetic Exams:  Diabetic Eye Exam: Overdue for diabetic eye exam. Pt has been advised about the importance in completing this exam. Patient advised to call and schedule an eye exam. Diabetic Foot Exam: Overdue, Pt has been advised about the importance in completing this exam. Pt is scheduled for diabetic foot exam on next office visit .   Interpreter Needed?: No  Information entered by :: l.[email or web address] Luepke,LPN   Activities of Daily Living In your present state of health, do you have any difficulty performing the following activities: 08/03/2021 10/06/2020  Hearing? N N  Vision? N N  Difficulty concentrating or making decisions? N N  Walking or climbing stairs? N N  Dressing or bathing? N N  Doing errands, shopping? N N  Preparing Food and eating ? N -  Using the Toilet? N -  In the past six months, have you accidently leaked urine? N -  Do you have problems with loss of bowel control? N -  Managing your Medications? N -  Managing your Finances? N -  Housekeeping or managing your Housekeeping? N -  Some recent data might be hidden    Patient Care Team: Biagio Borg, MD as PCP - General (Internal Medicine) Sanda Klein, MD as Consulting Physician (Cardiology) Frann Rider, NP as Nurse Practitioner (Neurology) [place], [place] any recent Medical Services you may have received from other than [facility] providers in the past year (date may be approximate).     Assessment:   This is a routine wellness examination for [place].  Hearing/Vision screen Vision Screening - Comments:: Referral  08/03/2021   Dietary issues and exercise activities discussed: Current Exercise Habits: The patient does not participate in regular exercise at present, Exercise limited by: orthopedic condition(s)   Goals Addressed   None    Depression  Screen [facility] 2/9 Scores 08/03/2021 08/03/2021 11/01/2020  10/16/2020 07/20/2020 05/02/2020 05/02/2020  PHQ - 2 Score 0 0 0 0 0 0 0  Exception Documentation - - - - - - -    Fall Risk Fall Risk  08/03/2021 11/01/2020 07/20/2020 05/02/2020 05/02/2020  Falls in the past year? 0 0 0 0 0  Comment - - - - -  Number falls in past yr: 0 0 0 0 0  Injury with Fall? 0 0 0 0 0  Risk for fall due to : No Fall Risks - No Fall Risks - No Fall Risks  Follow up Falls evaluation completed;Education provided - - - Falls evaluation completed    FALL RISK PREVENTION PERTAINING TO THE HOME:  Any stairs in or around the home? No  If so, are there any without handrails? No  Home free of loose throw rugs in walkways, pet beds, electrical cords, etc? Yes  Adequate lighting in your home to reduce risk of falls? Yes   ASSISTIVE DEVICES UTILIZED TO PREVENT FALLS:  Life alert? Yes  Use of a cane, walker or w/c? Yes  Grab bars in the bathroom? Yes  Shower chair or bench in shower? Yes  Elevated toilet seat or a handicapped toilet? Yes   Cognitive Function: Normal cognitive status assessed by direct observation by this Nurse Health Advisor. No abnormalities found.   MMSE - Mini Mental State Exam 03/10/2017 02/07/2015  Not completed: - Refused  Orientation to time 5 -  Orientation to Place 5 -  Registration 3 -  Attention/ Calculation 5 -  Recall 2 -  Language- name 2 objects 2 -  Language- repeat 1 -  Language- follow 3 step command 3 -  Language- read & follow direction 1 -  Write a sentence 1 -  Copy design 1 -  Total score 29 -        Immunizations Immunization History  Administered Date(s) Administered   Fluad Quad(high Dose 65+) 03/03/2019, 05/02/2020   Influenza Split 06/08/2012   Influenza Whole 05/25/2007, 03/17/2008, 03/27/2009   Influenza, High Dose Seasonal PF 03/10/2017, 03/15/2019   Influenza,inj,Quad PF,6+ Mos 02/07/2015, 03/06/2016   PFIZER(Purple Top)SARS-COV-2 Vaccination 08/09/2019,  08/31/2019, 05/23/2020   Pneumococcal Conjugate-13 05/14/2018, 03/03/2019   Pneumococcal Polysaccharide-23 06/27/2004, 03/06/2016   Td 09/26/2008   Tdap 05/03/2019   Zoster, Live 02/14/2014    TDAP status: Up to date  Flu Vaccine status: Up to date  Pneumococcal vaccine status: Up to date  Covid-19 vaccine status: Completed vaccines  Qualifies for Shingles Vaccine? Yes   Zostavax completed No   Shingrix Completed?: No.    Education has been provided regarding the importance of this vaccine. Patient has been advised to call insurance company to determine out of pocket expense if they have not yet received this vaccine. Advised may also receive vaccine at local pharmacy or Health Dept. Verbalized acceptance and understanding.  Screening Tests Health Maintenance  Topic Date Due   Zoster Vaccines- Shingrix (1 of 2) Never done   OPHTHALMOLOGY EXAM  05/01/2018   COVID-19 Vaccine (4 - Booster for Pfizer series) 07/18/2020   FOOT EXAM  10/31/2020   INFLUENZA VACCINE  12/25/2020   HEMOGLOBIN A1C  01/07/2021   Pneumonia Vaccine 29+ Years old (3 - PPSV23 if available, else PCV20) 03/06/2021   LIPID PANEL  12/19/2021   MAMMOGRAM  05/02/2022   COLONOSCOPY (Pts 45-64yr Insurance coverage will need to be confirmed)  07/22/2022   TETANUS/TDAP  05/02/2029   DEXA SCAN  Completed   Hepatitis C Screening  Completed   HPV VACCINES  Aged Out    Health Maintenance  Health Maintenance Due  Topic Date Due   Zoster Vaccines- Shingrix (1 of 2) Never done   OPHTHALMOLOGY EXAM  05/01/2018   COVID-19 Vaccine (4 - Booster for Pfizer series) 07/18/2020   FOOT EXAM  10/31/2020   INFLUENZA VACCINE  12/25/2020   HEMOGLOBIN A1C  01/07/2021   Pneumonia Vaccine 73+ Years old (3 - PPSV23 if available, else PCV20) 03/06/2021    Colorectal cancer screening: Type of screening: Colonoscopy. Completed 07/22/2012. Repeat every 10 years  Mammogram status: Completed 05/02/2021. Repeat every year  Bone  Density status: Completed 04/03/2017. Results reflect: Bone density results: OSTEOPENIA. Repeat every 5 years.  Lung Cancer Screening: (Low Dose CT Chest recommended if Age 69-80 years, 30 pack-year currently smoking OR have quit w/in 15years.) does not qualify.   Lung Cancer Screening Referral: n/a  Additional Screening:  Hepatitis C Screening: does not qualify; Completed 03/10/2021  Vision Screening: Recommended annual ophthalmology exams for early detection of glaucoma and other disorders of the eye. Is the patient up to date with their annual eye exam?  No  Who is the provider or what is the name of the office in which the patient attends annual eye exams? Referral 08/03/2021 If pt is not established with a provider, would they like to be referred to a provider to establish care? No .   Dental Screening: Recommended annual dental exams for proper oral hygiene  Community Resource Referral / Chronic Care Management: CRR required this visit?  No   CCM required this visit?  No      Plan:     I have personally reviewed and noted the following in the patients chart:   Medical and social history Use of alcohol, tobacco or illicit drugs  Current medications and supplements including opioid prescriptions.  Functional ability and status Nutritional status Physical activity Advanced directives List of other physicians Hospitalizations, surgeries, and ER visits in previous 12 months Vitals Screenings to include cognitive, depression, and falls Referrals and appointments  In addition, I have reviewed and discussed with patient certain preventive protocols, quality metrics, and best practice recommendations. A written personalized care plan for preventive services as well as general preventive health recommendations were provided to patient.     Randel Pigg, LPN   01/19/4157   Nurse Notes: none

## 2021-08-16 DIAGNOSIS — M1712 Unilateral primary osteoarthritis, left knee: Secondary | ICD-10-CM | POA: Diagnosis not present

## 2021-08-16 DIAGNOSIS — M25562 Pain in left knee: Secondary | ICD-10-CM | POA: Diagnosis not present

## 2021-08-22 DIAGNOSIS — M25562 Pain in left knee: Secondary | ICD-10-CM | POA: Diagnosis not present

## 2021-08-27 ENCOUNTER — Ambulatory Visit (INDEPENDENT_AMBULATORY_CARE_PROVIDER_SITE_OTHER): Payer: Medicare Other

## 2021-08-27 DIAGNOSIS — I639 Cerebral infarction, unspecified: Secondary | ICD-10-CM | POA: Diagnosis not present

## 2021-08-27 LAB — CUP PACEART REMOTE DEVICE CHECK
Date Time Interrogation Session: 20230331230757
Implantable Pulse Generator Implant Date: 20220802

## 2021-09-07 ENCOUNTER — Ambulatory Visit: Payer: Medicare Other | Admitting: Internal Medicine

## 2021-09-10 NOTE — Progress Notes (Signed)
Carelink Summary Report / Loop Recorder 

## 2021-09-14 ENCOUNTER — Encounter: Payer: Self-pay | Admitting: Internal Medicine

## 2021-09-14 ENCOUNTER — Ambulatory Visit (INDEPENDENT_AMBULATORY_CARE_PROVIDER_SITE_OTHER): Payer: Medicare Other | Admitting: Internal Medicine

## 2021-09-14 VITALS — BP 140/70 | HR 72 | Temp 98.4°F | Ht 62.0 in | Wt 158.0 lb

## 2021-09-14 DIAGNOSIS — Z794 Long term (current) use of insulin: Secondary | ICD-10-CM

## 2021-09-14 DIAGNOSIS — M79604 Pain in right leg: Secondary | ICD-10-CM | POA: Diagnosis not present

## 2021-09-14 DIAGNOSIS — Z0001 Encounter for general adult medical examination with abnormal findings: Secondary | ICD-10-CM | POA: Diagnosis not present

## 2021-09-14 DIAGNOSIS — E538 Deficiency of other specified B group vitamins: Secondary | ICD-10-CM | POA: Diagnosis not present

## 2021-09-14 DIAGNOSIS — R21 Rash and other nonspecific skin eruption: Secondary | ICD-10-CM | POA: Diagnosis not present

## 2021-09-14 DIAGNOSIS — Z23 Encounter for immunization: Secondary | ICD-10-CM | POA: Diagnosis not present

## 2021-09-14 DIAGNOSIS — E559 Vitamin D deficiency, unspecified: Secondary | ICD-10-CM

## 2021-09-14 DIAGNOSIS — I10 Essential (primary) hypertension: Secondary | ICD-10-CM

## 2021-09-14 DIAGNOSIS — G8929 Other chronic pain: Secondary | ICD-10-CM

## 2021-09-14 DIAGNOSIS — M25512 Pain in left shoulder: Secondary | ICD-10-CM

## 2021-09-14 DIAGNOSIS — E1165 Type 2 diabetes mellitus with hyperglycemia: Secondary | ICD-10-CM

## 2021-09-14 DIAGNOSIS — M25562 Pain in left knee: Secondary | ICD-10-CM | POA: Diagnosis not present

## 2021-09-14 DIAGNOSIS — M79605 Pain in left leg: Secondary | ICD-10-CM

## 2021-09-14 LAB — CBC WITH DIFFERENTIAL/PLATELET
Basophils Absolute: 0 10*3/uL (ref 0.0–0.1)
Basophils Relative: 0.4 % (ref 0.0–3.0)
Eosinophils Absolute: 0 10*3/uL (ref 0.0–0.7)
Eosinophils Relative: 0.6 % (ref 0.0–5.0)
HCT: 39.1 % (ref 36.0–46.0)
Hemoglobin: 12.8 g/dL (ref 12.0–15.0)
Lymphocytes Relative: 25.1 % (ref 12.0–46.0)
Lymphs Abs: 1.1 10*3/uL (ref 0.7–4.0)
MCHC: 32.7 g/dL (ref 30.0–36.0)
MCV: 89.8 fl (ref 78.0–100.0)
Monocytes Absolute: 0.3 10*3/uL (ref 0.1–1.0)
Monocytes Relative: 7.5 % (ref 3.0–12.0)
Neutro Abs: 3 10*3/uL (ref 1.4–7.7)
Neutrophils Relative %: 66.4 % (ref 43.0–77.0)
Platelets: 221 10*3/uL (ref 150.0–400.0)
RBC: 4.36 Mil/uL (ref 3.87–5.11)
RDW: 14.1 % (ref 11.5–15.5)
WBC: 4.5 10*3/uL (ref 4.0–10.5)

## 2021-09-14 LAB — HEPATIC FUNCTION PANEL
ALT: 21 U/L (ref 0–35)
AST: 20 U/L (ref 0–37)
Albumin: 4.4 g/dL (ref 3.5–5.2)
Alkaline Phosphatase: 61 U/L (ref 39–117)
Bilirubin, Direct: 0.1 mg/dL (ref 0.0–0.3)
Total Bilirubin: 0.4 mg/dL (ref 0.2–1.2)
Total Protein: 8.1 g/dL (ref 6.0–8.3)

## 2021-09-14 LAB — BASIC METABOLIC PANEL
BUN: 15 mg/dL (ref 6–23)
CO2: 27 mEq/L (ref 19–32)
Calcium: 9.3 mg/dL (ref 8.4–10.5)
Chloride: 104 mEq/L (ref 96–112)
Creatinine, Ser: 0.76 mg/dL (ref 0.40–1.20)
GFR: 79.53 mL/min (ref >60.00)
Glucose, Bld: 96 mg/dL (ref 70–99)
Potassium: 3.9 mEq/L (ref 3.5–5.1)
Sodium: 139 mEq/L (ref 135–145)

## 2021-09-14 LAB — VITAMIN B12: Vitamin B-12: >1504 pg/mL — ABNORMAL HIGH (ref 211–911)

## 2021-09-14 LAB — LIPID PANEL
Cholesterol: 154 mg/dL (ref 0–200)
HDL: 63.5 mg/dL (ref >39.00)
LDL Cholesterol: 80 mg/dL (ref 0–99)
NonHDL: 90.89
Total CHOL/HDL Ratio: 2
Triglycerides: 56 mg/dL (ref 0.0–149.0)
VLDL: 11.2 mg/dL (ref 0.0–40.0)

## 2021-09-14 LAB — URINALYSIS, ROUTINE W REFLEX MICROSCOPIC
Bilirubin Urine: NEGATIVE
Ketones, ur: NEGATIVE
Nitrite: NEGATIVE
RBC / HPF: NONE SEEN (ref 0–?)
Specific Gravity, Urine: 1.01 (ref 1.000–1.030)
Total Protein, Urine: NEGATIVE
Urine Glucose: NEGATIVE
Urobilinogen, UA: 0.2 (ref 0.0–1.0)
pH: 5.5 (ref 5.0–8.0)

## 2021-09-14 LAB — MICROALBUMIN / CREATININE URINE RATIO
Creatinine,U: 33.7 mg/dL
Microalb Creat Ratio: 2.1 mg/g (ref 0.0–30.0)
Microalb, Ur: <0.7 mg/dL (ref 0.0–1.9)

## 2021-09-14 LAB — TSH: TSH: 1.03 u[IU]/mL (ref 0.35–5.50)

## 2021-09-14 LAB — HEMOGLOBIN A1C: Hgb A1c MFr Bld: 6.8 % — ABNORMAL HIGH (ref 4.6–6.5)

## 2021-09-14 LAB — C-REACTIVE PROTEIN: CRP: <1 mg/dL (ref 0.5–20.0)

## 2021-09-14 LAB — SEDIMENTATION RATE: Sed Rate: 45 mm/hr — ABNORMAL HIGH (ref 0–30)

## 2021-09-14 LAB — VITAMIN D 25 HYDROXY (VIT D DEFICIENCY, FRACTURES): VITD: 51.29 ng/mL (ref 30.00–100.00)

## 2021-09-14 MED ORDER — GABAPENTIN 600 MG PO TABS: 600.0000 mg | ORAL_TABLET | Freq: Three times a day (TID) | ORAL | 5 refills | Status: DC

## 2021-09-14 MED ORDER — ONETOUCH ULTRA VI STRP: ORAL_STRIP | 3 refills | Status: DC

## 2021-09-14 NOTE — Progress Notes (Signed)
Patient ID: Tiffany [name], female   DOB: [birth year], 71 y.o.   MRN: [id number] ? ? ? ?     Chief Complaint:: wellness exam and Follow-up (6 month f/u/Tender "spots" on both legs) ? With mottling, left shoudler pain, neuropathy ? ?     HPI:  Tiffany [name] is a 71 y.o. female here for wellness exam; due for pneumovax, eye exam, declines covid booster and shingrix, o/w up to date ?         ?              Also c/o sensitive skin areas to bialteral legs below the knees with unusual mottling type appearance but no swelling, ulcers, redness; also has persistent > 25moleftknee pain and swelling that leads to swelling of the left leg below the knee as well .  Also has 2 wks onset left shoulder pain, swelling and reduced ROM to abduction to 90 degrees only.  Also has worsening neuropathy pain to legs below the knees, with gabapentin 300 tid not working well enough though does help.  Pt denies chest pain, increased sob or doe, wheezing, orthopnea, PND, increased LE swelling, palpitations, dizziness or syncope.  Pt denies polydipsia, polyuria,  Pt denies fever, wt loss, night sweats, loss of appetite, or other constitutional symptoms   ?  ?Wt Readings from Last 3 Encounters:  ?09/14/21 158 lb (71.7 kg)  ?04/27/21 156 lb (70.8 kg)  ?04/10/21 156 lb 3.2 oz (70.9 kg)  ? ?BP Readings from Last 3 Encounters:  ?09/14/21 140/70  ?04/27/21 121/83  ?04/10/21 (!) 142/72  ? ?Immunization History  ?Administered Date(s) Administered  ? Fluad Quad(high Dose 65+) 03/03/2019, 05/02/2020  ? Influenza Split 06/08/2012  ? Influenza Whole 05/25/2007, 03/17/2008, 03/27/2009  ? Influenza, High Dose Seasonal PF 03/10/2017, 03/15/2019  ? Influenza,inj,Quad PF,6+ Mos 02/07/2015, 03/06/2016  ? PFIZER(Purple Top)SARS-COV-2 Vaccination 08/09/2019, 08/31/2019, 05/23/2020  ? Pneumococcal Conjugate-13 05/14/2018, 03/03/2019  ? Pneumococcal Polysaccharide-23 06/27/2004, 03/06/2016, 09/14/2021  ? Td 09/26/2008  ? Tdap 05/03/2019  ? Zoster, Live 02/14/2014   ? ?Health Maintenance Due  ?Topic Date Due  ? OPHTHALMOLOGY EXAM  05/01/2018  ? ?  ? ?Past Medical History:  ?Diagnosis Date  ? Allergy   ? Degenerative joint disease   ? Diabetes mellitus   ? GERD (gastroesophageal reflux disease)   ? Headache   ? Hyperlipidemia   ? Hypertension   ? Stroke ([facility] [place]   ? ?Past Surgical History:  ?Procedure Laterality Date  ? ABDOMINAL HYSTERECTOMY  1976  ? BACK SURGERY    ? KNEE ARTHROSCOPY    ? left x 2  ? LUMBAR LAMINECTOMY  1989  ? ? reports that she quit smoking about 31 years ago. Her smoking use included cigarettes. She has a 7.50 pack-year smoking history. She has never used smokeless tobacco. She reports that she does not drink alcohol and does not use drugs. ?family history includes Diabetes in her mother and sister; Heart disease in her brother and mother; Hypertension in her brother and mother; Kidney disease in her mother; Stroke in her brother; Thyroid disease in her mother. ?Allergies  ?Allergen Reactions  ? Contrast Media [Iodinated Contrast Media] Anaphylaxis, Nausea And Vomiting, Swelling and Other (See Comments)  ?  Patients throat closes with nausea and vomiting   ? Aspirin Nausea And Vomiting and Other (See Comments)  ?  Can take EC aspirin  ? Gadolinium Derivatives Nausea And Vomiting  ? Metformin And Related Nausea And Vomiting and Other (See  Comments)  ?  Severe nausea and vomiting  ? Shellfish Allergy Nausea And Vomiting  ? Tramadol Nausea And Vomiting and Other (See Comments)  ?  Migraines  ? Penicillins Nausea And Vomiting  ? Statins Other (See Comments)  ?  Myalgias ?  ? ?Current Outpatient Medications on File Prior to Visit  ?Medication Sig Dispense Refill  ? Accu-Chek Softclix Lancets lancets USE   TO CHECK GLUCOSE IN THE MORNING AND AT BEDTIME 200 each 0  ? albuterol (VENTOLIN HFA) 108 (90 Base) MCG/ACT inhaler Inhale 2 puffs into the lungs every 6 (six) hours as needed for wheezing or shortness of breath. 8 g 5  ? amLODipine (NORVASC) 5 MG tablet TAKE 1  TABLET BY MOUTH EVERY DAY 90 tablet 0  ? aspirin EC 325 MG EC tablet Take 1 tablet (325 mg total) by mouth daily. 30 tablet 0  ? Blood Glucose Monitoring Suppl (ONE TOUCH ULTRA 2) w/Device KIT Apply 1 Device topically in the morning and at bedtime. E11.9 1 kit 0  ? celecoxib (CELEBREX) 200 MG capsule Take 200 mg by mouth 2 (two) times daily.    ? Cholecalciferol (VITAMIN D) 50 MCG (2000 UT) tablet Take 2,000 Units by mouth daily.    ? EPINEPHrine 0.3 mg/0.3 mL IJ SOAJ injection Inject 0.3 mLs (0.3 mg total) into the muscle as needed for anaphylaxis. 2 each 1  ? ezetimibe (ZETIA) 10 MG tablet Take 1 tablet (10 mg total) by mouth daily. 90 tablet 3  ? insulin glargine (LANTUS SOLOSTAR) 100 UNIT/ML Solostar Pen Inject 55 Units into the skin at bedtime. 100 mL 3  ? Insulin Pen Needle 32G X 4 MM MISC Use to inject  lantus once daily (Patient taking differently: Use to inject  lantus once daily) 100 each 4  ? losartan (COZAAR) 100 MG tablet Take 0.5 tablets (50 mg total) by mouth daily. 90 tablet 3  ? pantoprazole (PROTONIX) 40 MG tablet Take 1 tablet (40 mg total) by mouth daily. 90 tablet 3  ? pravastatin (PRAVACHOL) 40 MG tablet Take 1 tablet (40 mg total) by mouth daily at 6 PM. 90 tablet 2  ? tiZANidine (ZANAFLEX) 4 MG tablet TAKE 1 TABLET BY MOUTH EVERY 8 HOURS AS NEEDED FOR MUSCLE SPASM 90 tablet 0  ? triamcinolone (NASACORT) 55 MCG/ACT AERO nasal inhaler Place 2 sprays into the nose daily. 3 each 3  ? vitamin B-12 (CYANOCOBALAMIN) 1000 MCG tablet Take 1 tablet by mouth once daily 90 tablet 3  ? zolpidem (AMBIEN) 10 MG tablet TAKE 1 TABLET BY MOUTH AT BEDTIME AS NEEDED 90 tablet 1  ? ?Current Facility-Administered Medications on File Prior to Visit  ?Medication Dose Route Frequency Provider Last Rate Last Admin  ? lidocaine-EPINEPHrine (XYLOCAINE W/EPI) 1 %-1:100000 (with pres) injection 10 mL  10 mL Infiltration Once Croitoru, Mihai, MD      ? ?     ROS:  All others reviewed and negative. ? ?Objective  ? ?     PE:   BP 140/70 (BP Location: Right Arm, Patient Position: Sitting, Cuff Size: Large)   Pulse 72   Temp 98.4 ?F (36.9 ?C) (Oral)   Ht $R'5\' 2"'PL$  (1.575 m)   Wt 158 lb (71.7 kg)   SpO2 100%   BMI 28.90 kg/m?  ? ?              Constitutional: Pt appears in NAD ?              HENT:  Head: NCAT.  ?              Right Ear: External ear normal.   ?              Left Ear: External ear normal.  ?              Eyes: . Pupils are equal, round, and reactive to light. Conjunctivae and EOM are normal ?              Nose: without d/c or deformity ?              Neck: Neck supple. Gross normal ROM ?              Cardiovascular: Normal rate and regular rhythm.   ?              Pulmonary/Chest: Effort normal and breath sounds without rales or wheezing.  ?              Abd:  Soft, NT, ND, + BS, no organomegaly ?              Neurological: Pt is alert. At baseline orientation, motor grossly intact ?              Skin: Skin is warm. LE edema - trace LLE, left knee with small effusion and bony degnerative changes, blat legs below the knees with unsusual mottling appearance no ulcers or erythema ?              Psychiatric: Pt behavior is normal without agitation  ? ?Micro: none ? ?Cardiac tracings I have personally interpreted today:  none ? ?Pertinent Radiological findings (summarize): none  ? ?Lab Results  ?Component Value Date  ? WBC 4.5 09/14/2021  ? HGB 12.8 09/14/2021  ? HCT 39.1 09/14/2021  ? PLT 221.0 09/14/2021  ? GLUCOSE 96 09/14/2021  ? CHOL 154 09/14/2021  ? TRIG 56.0 09/14/2021  ? HDL 63.50 09/14/2021  ? [place] 80 09/14/2021  ? ALT 21 09/14/2021  ? AST 20 09/14/2021  ? NA 139 09/14/2021  ? K 3.9 09/14/2021  ? CL 104 09/14/2021  ? CREATININE 0.76 09/14/2021  ? BUN 15 09/14/2021  ? CO2 27 09/14/2021  ? TSH 1.03 09/14/2021  ? INR 1.0 10/06/2020  ? HGBA1C 6.8 (H) 09/14/2021  ? MICROALBUR <0.7 09/14/2021  ? ?Assessment/Plan:  ?Tiffany [name] is a 71 y.o. Black or African American [2] female with  has a past medical history of  Allergy, Degenerative joint disease, Diabetes mellitus, GERD (gastroesophageal reflux disease), Headache, Hyperlipidemia, Hypertension, and Stroke ([place]). ? ?Encounter for well adult exam with abnormal findings ?

## 2021-09-14 NOTE — Patient Instructions (Signed)
You had the Pneumovax pneumonia shot today ? ?Ok to increase the gabapentin to 600 mg three times per day ? ?Please continue all other medications as before, and refills have been done if requested. ? ?Please have the pharmacy call with any other refills you may need. ? ?Please continue your efforts at being more active, low cholesterol diet, and weight control. ? ?You are otherwise up to date with prevention measures today. ? ?Please keep your appointments with your specialists as you may have planned - orthopedic for the MRI left knee next wk ? ?Please consider seeing Sports medicine on the first floor, or your orthopedic for the left shoulder bursitis ? ?Please go to the LAB at the blood drawing area for the tests to be done ? ?You will be contacted by phone if any changes need to be made immediately.  Otherwise, you will receive a letter about your results with an explanation, but please check with MyChart first. ? ?Please remember to sign up for MyChart if you have not done so, as this will be important to you in the future with finding out test results, communicating by private email, and scheduling acute appointments online when needed. ? ?Please make an Appointment to return in 6 months, or sooner if needed ?

## 2021-09-17 ENCOUNTER — Encounter: Payer: Self-pay | Admitting: Internal Medicine

## 2021-09-17 ENCOUNTER — Telehealth: Payer: Self-pay

## 2021-09-17 NOTE — Assessment & Plan Note (Signed)
C/w djd or other meniscal tear - has MRI and ortho planned for next wk ?

## 2021-09-17 NOTE — Assessment & Plan Note (Signed)
With unusual mottling for esr r/o vasculitis, but also increased gabapentin 600 tid for likely neuropathy  ?

## 2021-09-17 NOTE — Assessment & Plan Note (Signed)
Lab Results  ?Component Value Date  ? HGBA1C 6.8 (H) 09/14/2021  ? ?Stable, pt to continue current medical treatment lantus ? ?

## 2021-09-17 NOTE — Assessment & Plan Note (Signed)
Exam c/w mod to severe bursitis, but declines sport med for now,  to f/u any worsening symptoms or concerns ?

## 2021-09-17 NOTE — Assessment & Plan Note (Signed)
Last vitamin D Lab Results  Component Value Date   VD25OH 51.29 09/14/2021   Stable, cont oral replacement  

## 2021-09-17 NOTE — Telephone Encounter (Signed)
Pt called to get test results. I advised pt of Dr. Jenny Reichmann results saying The test results show that your current treatment is OK, as the tests are stable  ?  ?There is no other need for change of treatment or further evaluation based on these results, at this time.  thanks ? ?Pt understood and had no further questions ?

## 2021-09-17 NOTE — Assessment & Plan Note (Signed)
Lab Results  Component Value Date   VITAMINB12 >1504 (H) 09/14/2021   Stable, cont oral replacement - b12 1000 mcg qd  

## 2021-09-17 NOTE — Assessment & Plan Note (Signed)
BP Readings from Last 3 Encounters:  ?09/14/21 140/70  ?04/27/21 121/83  ?04/10/21 (!) 142/72  ? ?Stable, pt to continue medical treatment norvasc, losartan ? ?

## 2021-09-17 NOTE — Assessment & Plan Note (Signed)
Age and sex appropriate education and counseling updated with regular exercise and diet ?Referrals for preventative services - none needed ?Immunizations addressed - for pneumovax ?Smoking counseling  - none needed ?Evidence for depression or other mood disorder - none significant ?Most recent labs reviewed. ?I have personally reviewed and have noted: ?1) the patient's medical and social history ?2) The patient's current medications and supplements ?3) The patient's height, weight, and BMI have been recorded in the chart ? ?

## 2021-09-21 DIAGNOSIS — M170 Bilateral primary osteoarthritis of knee: Secondary | ICD-10-CM | POA: Diagnosis not present

## 2021-09-24 ENCOUNTER — Encounter: Payer: Self-pay | Admitting: Adult Health

## 2021-09-24 ENCOUNTER — Ambulatory Visit (INDEPENDENT_AMBULATORY_CARE_PROVIDER_SITE_OTHER): Payer: Medicare Other | Admitting: Adult Health

## 2021-09-24 VITALS — BP 157/86 | HR 72 | Ht 62.0 in | Wt 158.0 lb

## 2021-09-24 DIAGNOSIS — I639 Cerebral infarction, unspecified: Secondary | ICD-10-CM

## 2021-09-24 NOTE — Patient Instructions (Addendum)
Continue aspirin 81 mg daily  and pravastatin for secondary stroke prevention ? ?Continue to follow up with PCP regarding cholesterol, blood pressure and diabetes management  ?Maintain strict control of hypertension with blood pressure goal below 130/90, diabetes with hemoglobin A1c goal below 7.0 % and cholesterol with LDL cholesterol (bad cholesterol) goal below 70 mg/dL.  ? ?Signs of a Stroke? Follow the BEFAST method:  ?Balance Watch for a sudden loss of balance, trouble with coordination or vertigo ?Eyes Is there a sudden loss of vision in one or both eyes? Or double vision?  ?Face: Ask the person to smile. Does one side of the face droop or is it numb?  ?Arms: Ask the person to raise both arms. Does one arm drift downward? Is there weakness or numbness of a leg? ?Speech: Ask the person to repeat a simple phrase. Does the speech sound slurred/strange? Is the person confused ? ?Time: If you observe any of these signs, call 911. ? ? ? ? ? ? ? ?Thank you for coming to see [place] at [facility]. I hope we have been able to provide you high quality care today. ? ?You may receive a patient satisfaction survey over the next few weeks. We would appreciate your feedback and comments so that we may continue to improve ourselves and the health of our patients. ? ? ? ? ? ?Stroke Prevention ?Some medical conditions and lifestyle choices can lead to a higher risk for a stroke. You can help to prevent a stroke by eating healthy foods and exercising. It also helps to not smoke and to manage any health problems you may have. ?How can this condition affect me? ?A stroke is an emergency. It should be treated right away. A stroke can lead to brain damage or threaten your life. There is a better chance of surviving and getting better after a stroke if you get medical help right away. ?What can increase my risk? ?The following medical conditions may increase your risk of a stroke: ?Diseases of the heart and blood  vessels (cardiovascular disease). ?High blood pressure (hypertension). ?Diabetes. ?High cholesterol. ?Sickle cell disease. ?Problems with blood clotting. ?Being very overweight. ?Sleeping problems (obstructivesleep apnea). ?Other risk factors include: ?Being older than age 40. ?A history of blood clots, stroke, or mini-stroke (TIA). ?Race, ethnic background, or a family history of stroke. ?Smoking or using tobacco products. ?Taking birth control pills, especially if you smoke. ?Heavy alcohol and drug use. ?Not being active. ?What actions can I take to prevent this? ?Manage your health conditions ?High cholesterol. ?Eat a healthy diet. If this is not enough to manage your cholesterol, you may need to take medicines. ?Take medicines as told by your doctor. ?High blood pressure. ?Try to keep your blood pressure below 130/80. ?If your blood pressure cannot be managed through a healthy diet and regular exercise, you may need to take medicines. ?Take medicines as told by your doctor. ?Ask your doctor if you should check your blood pressure at home. ?Have your blood pressure checked every year. ?Diabetes. ?Eat a healthy diet and get regular exercise. If your blood sugar (glucose) cannot be managed through diet and exercise, you may need to take medicines. ?Take medicines as told by your doctor. ?Talk to your doctor about getting checked for sleeping problems. Signs of a problem can include: ?Snoring a lot. ?Feeling very tired. ?Make sure that you manage any other conditions you have. ?Nutrition ? ?Follow instructions from your doctor about what to eat or drink.  You may be told to: ?Eat and drink fewer calories each day. ?Limit how much salt (sodium) you use to 1,500 milligrams (mg) each day. ?Use only healthy fats for cooking, such as olive oil, canola oil, and sunflower oil. ?Eat healthy foods. To do this: ?Choose foods that are high in fiber. These include whole grains, and fresh fruits and vegetables. ?Eat at least 5  servings of fruits and vegetables a day. Try to fill one-half of your plate with fruits and vegetables at each meal. ?Choose low-fat (lean) proteins. These include low-fat cuts of meat, chicken without skin, fish, tofu, beans, and nuts. ?Eat low-fat dairy products. ?Avoid foods that: ?Are high in salt. ?Have saturated fat. ?Have trans fat. ?Have cholesterol. ?Are processed or pre-made. ?Count how many carbohydrates you eat and drink each day. ?Lifestyle ?If you drink alcohol: ?Limit how much you have to: ?0-1 drink a day for women who are not pregnant. ?0-2 drinks a day for men. ?Know how much alcohol is in your drink. In the [place] one drink equals one 12 oz bottle of beer (332m), one 5 oz glass of wine (1458m, or one 1? oz glass of hard liquor (4456m ?Do not smoke or use any products that have nicotine or tobacco. If you need help quitting, ask your doctor. ?Avoid secondhand smoke. ?Do not use drugs. ?Activity ? ?Try to stay at a healthy weight. ?Get at least 30 minutes of exercise on most days, such as: ?Fast walking. ?Biking. ?Swimming. ?Medicines ?Take over-the-counter and prescription medicines only as told by your doctor. ?Avoid taking birth control pills. Talk to your doctor about the risks of taking birth control pills if: ?You are over 35 80ars old. ?You smoke. ?You get very bad headaches. ?You have had a blood clot. ?Where to find more information ?American Stroke Association: www.strokeassociation.org ?Get help right away if: ?You or a loved one has any signs of a stroke. "BE FAST" is an easy way to remember the warning signs: ?B - Balance. Dizziness, sudden trouble walking, or loss of balance. ?E - Eyes. Trouble seeing or a change in how you see. ?F - Face. Sudden weakness or loss of feeling of the face. The face or eyelid may droop on one side. ?A - Arms. Weakness or loss of feeling in an arm. This happens all of a sudden and most often on one side of the body. ?S - Speech. Sudden trouble speaking,  slurred speech, or trouble understanding what people say. ?T - Time. Time to call emergency services. Write down what time symptoms started. ?You or a loved one has other signs of a stroke, such as: ?A sudden, very bad headache with no known cause. ?Feeling like you may vomit (nausea). ?Vomiting. ?A seizure. ?These symptoms may be an emergency. Get help right away. Call your local emergency services (911 in the [place] ?Do not wait to see if the symptoms will go away. ?Do not drive yourself to the hospital. ?Summary ?You can help to prevent a stroke by eating healthy, exercising, and not smoking. It also helps to manage any health problems you have. ?Do not smoke or use any products that contain nicotine or tobacco. ?Get help right away if you or a loved one has any signs of a stroke. ?This information is not intended to replace advice given to you by your health care provider. Make sure you discuss any questions you have with your health care provider. ?Document Revised: 12/13/2019 Document Reviewed: 12/13/2019 ?Elsevier Patient Education ? 2023  [place]. ? ? ? ?

## 2021-09-24 NOTE — Progress Notes (Signed)
?Tiffany [name] ?[address] ?[place]. [name] [address] ?[phone]) [phone] ? ?     STROKE FOLLOW UP NOTE ? ?[name] ?Date of Birth:  04/25/1951 ?Medical Record Number:  [id number]  ? ?Reason for Referral: stroke follow up ? ? ? ?SUBJECTIVE: ? ? ?CHIEF COMPLAINT:  ?Chief Complaint  ?Patient presents with  ? Follow-up  ?  Rm 3 alone ?Pt is well and stable, no new stroke concerns   ? ? ?HPI:  ? ?Update 09/24/2021 JM: Patient returns for 57-monthstroke follow-up.  Overall stable without new stroke/TIA symptoms.  Denies any residual right arm weakness. Does have some pain in right hand/arm only with use of cane. She is being followed by ortho for chronic multiple joint pain - plans on having gel placed in knee and if no benefit, will likely need to proceed with knee replacement. Continued use of cane at all times, no recent falls.  Continues to maintain ADLs and IADLs independently.  Has remained on aspirin and pravastatin, denies side effects.  Blood pressure today elevated, her sister recently had b/l BKA so this has been causing stress.  Loop recorder has not shown atrial fibrillation thus far.  Completed sleep study 03/2021 which did not show evidence of underlying sleep disorder.  No further concerns at this time.  ? ? ? ? ?History provided for reference purposes only ?Update 03/21/2021 JM: returns for stroke follow up unaccompanied.  Overall stable from stroke standpoint without new stroke/TIA symptoms.  Reports right arm pain which has been ongoing since stroke. Completed therapies since prior visit. C/o multiple joint pain locations which is chronic. Current use of cane to help with BLE pains.  Denies any recent falls.  Continues to live alone maintaining ADLs and IADLs independently. She is scheduled to undergo sleep study next week but thinking about rescheduling due to other health issues currently and other personal issues.  She has remained on both aspirin and Plavix as well as  pravastatin and Zetia without side effects.  Blood pressure today 159/87.  Monitors at home and has been stable.  No further concerns at this time. ? ?Initial visit 11/16/2020 JM: [name] being seen for hospital follow-up unaccompanied.  Reports improvement of RUE strength with some weakness in wrist and forearm as well as shoulder fatigue/pain but has been gradually improving. Reports fatiguing quicker than prior to her stroke.  She does have chronic joint pain which she believes slightly worsened post stroke including right hip, left knee and shoulders.  She has been working with outpatient PT. Lives on own able to maintain all ADLs and IADLs independently. PCP Dr. JJenny Reichmannrecently referred to ortho for ongoing knee pain - she is currently waiting to schedule.  Denies new stroke/TIA symptoms.  Remains on aspirin, Plavix and pravastatin without associated side effects.  Blood pressure today 148/87. Monitors glucose levels at home - typically 100-120s.  Cardiac monitor completed 6/18 - currently waiting results.  Prior history of sleep apnea but has not used CPAP machine "in many years" as she believes her old one was not working correctly.  No further concerns at this time. ? ?Stroke admission 10/06/2020 ?BVivianne Carlesis a 71yo female with a PMHx of OSA without use of CPAP, HTN, HLD, DM II, and DJD who presented on 10/06/2020 with right sided weakness. Personally reviewded hospitalization pertitent progress ntoes, lab work and imaging with summary provided. Stroke work up revealed left MCA infarct, likely due to L M2 high grade  stenosis. Recommended 30 day cardiac event monitor outpatient to rule out A fib as potential etiology. Recommended 3 months DAPT then aspirin alone. LDL 159 - started pravastatin in addition to home zetia with hx of statin intolerance. A1c 7.4. OSA not on CPAP. Evaluated by therapies with recommended outpatient PT and discharged home in stable condition. ? ?Stroke:  left MCA infarct s/p tPA,  likely due to left M2 high-grade stenosis ?MRI left parietal, MCA/PCA, left MCA/ACA infarcts ?MRA head and neck left M2 high-grade stenosis ?2D Echo EF 65 to 70% ?Will do 30-day cardiac event monitoring as outpatient to rule out A. Fib (Trish messaged) ?LDL 159 ?HgbA1c 7.4 ?UDS pending ?SCDs for VTE prophylaxis ?No antithrombotic prior to admission, now on ASA EC 325 and clopidogrel 75 mg daily for 3 months and then ASA alone.  ?Patient counseled to be compliant with her antithrombotic medications ?Ongoing aggressive stroke risk factor management ?Therapy recommendations: out pt PT ?Disposition: home today ? ? ? ? ? ?ROS:   ?14 system review of systems performed and negative with exception of those listed in HPI ? ?PMH:  ?Past Medical History:  ?Diagnosis Date  ? Allergy   ? Degenerative joint disease   ? Diabetes mellitus   ? GERD (gastroesophageal reflux disease)   ? Headache   ? Hyperlipidemia   ? Hypertension   ? Stroke [facility])   ? ? ?PSH:  ?Past Surgical History:  ?Procedure Laterality Date  ? ABDOMINAL HYSTERECTOMY  1976  ? BACK SURGERY    ? KNEE ARTHROSCOPY    ? left x 2  ? LUMBAR LAMINECTOMY  1989  ? ? ?Social History:  ?Social History  ? ?Socioeconomic History  ? Marital status: Single  ?  Spouse name: Not on file  ? Number of children: 2  ? Years of education: 8  ? Highest education level: 8th grade  ?Occupational History  ? Occupation: Disability  ?  Comment: retired day care worker  ?Tobacco Use  ? Smoking status: Former  ?  Packs/day: 0.50  ?  Years: 15.00  ?  Pack years: 7.50  ?  Types: Cigarettes  ?  Quit date: 08/02/1990  ?  Years since quitting: 31.1  ? Smokeless tobacco: Never  ?Vaping Use  ? Vaping Use: Never used  ?Substance and Sexual Activity  ? Alcohol use: No  ?  Alcohol/week: 0.0 standard drinks  ? Drug use: No  ? Sexual activity: Never  ?Other Topics Concern  ? Not on file  ?Social History Narrative  ? Born in [place], [place].  ? Completed the 8th grade  ? Fun: Walk, crochet, puzzles  ? Diet -  Variety of foods, does not eat breakfast  ? 2 children - 44 and 45   ? 6 grandchildren - 7 great grandchildren.   ? 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 Right handed   ? Caffeine: 1/2 cup of coffee, rarely   ? ?Social Determinants of Health  ? ?Financial Resource Strain: Low Risk   ? Difficulty of Paying Living Expenses: Not hard at all  ?Food Insecurity: No Food Insecurity  ? Worried About Charity fundraiser in the Last Year: Never true  ? Ran Out of Food in the Last Year: Never true  ?Transportation Needs: No Transportation Needs  ? Lack of Transportation (Medical): No  ? Lack of Transportation (Non-Medical): No  ?Physical Activity: Insufficiently Active  ? Days of Exercise per Week: 2 days  ? Minutes of Exercise  per Session: 20 min  ?Stress: No Stress Concern Present  ? Feeling of Stress : Not at all  ?Social Connections: Socially Isolated  ? Frequency of Communication with Friends and Family: Twice a week  ? Frequency of Social Gatherings with Friends and Family: Twice a week  ? Attends Religious Services: Never  ? Active Member of Clubs or Organizations: No  ? Attends Archivist Meetings: Never  ? Marital Status: Divorced  ?Intimate Partner Violence: Not At Risk  ? Fear of Current or Ex-Partner: No  ? Emotionally Abused: No  ? Physically Abused: No  ? Sexually Abused: No  ? ? ?Family History:  ?Family History  ?Problem Relation Age of Onset  ? Hypertension Mother   ? Diabetes Mother   ? Thyroid disease Mother   ? Heart disease Mother   ? Kidney disease Mother   ? Diabetes Sister   ? Hypertension Brother   ? Stroke Brother   ? Heart disease Brother   ? Colon cancer Neg Hx   ? Colon polyps Neg Hx   ? Rectal cancer Neg Hx   ? Stomach cancer Neg Hx   ? ? ?Medications:   ?Current Outpatient Medications on File Prior to Visit  ?Medication Sig Dispense Refill  ? Accu-Chek Softclix Lancets lancets USE   TO CHECK GLUCOSE IN THE MORNING AND AT BEDTIME 200 each 0  ? albuterol (VENTOLIN HFA)  108 (90 Base) MCG/ACT inhaler Inhale 2 puffs into the lungs every 6 (six) hours as needed for wheezing or shortness of breath. 8 g 5  ? amLODipine (NORVASC) 5 MG tablet TAKE 1 TABLET BY MOUTH EVERY DAY 90 tabl

## 2021-09-27 LAB — CUP PACEART REMOTE DEVICE CHECK
Date Time Interrogation Session: 20230503230808
Implantable Pulse Generator Implant Date: 20220802

## 2021-10-01 ENCOUNTER — Ambulatory Visit (INDEPENDENT_AMBULATORY_CARE_PROVIDER_SITE_OTHER): Payer: Medicare Other

## 2021-10-01 DIAGNOSIS — I639 Cerebral infarction, unspecified: Secondary | ICD-10-CM | POA: Diagnosis not present

## 2021-10-09 ENCOUNTER — Other Ambulatory Visit: Payer: Self-pay | Admitting: Internal Medicine

## 2021-10-17 NOTE — Progress Notes (Signed)
Carelink Summary Report / Loop [email or web address].c 

## 2021-10-21 ENCOUNTER — Other Ambulatory Visit: Payer: Self-pay | Admitting: Internal Medicine

## 2021-10-21 NOTE — Telephone Encounter (Signed)
Please refill as per office routine med refill policy (all routine meds to be refilled for 3 mo or monthly (per pt preference) up to one year from last visit, then month to month grace period for 3 mo, then further med refills will have to be denied) ? ?

## 2021-10-25 DIAGNOSIS — M1712 Unilateral primary osteoarthritis, left knee: Secondary | ICD-10-CM | POA: Diagnosis not present

## 2021-11-01 DIAGNOSIS — M1712 Unilateral primary osteoarthritis, left knee: Secondary | ICD-10-CM | POA: Diagnosis not present

## 2021-11-05 ENCOUNTER — Ambulatory Visit (INDEPENDENT_AMBULATORY_CARE_PROVIDER_SITE_OTHER): Payer: Medicare Other

## 2021-11-05 ENCOUNTER — Ambulatory Visit: Payer: Medicare Other | Admitting: Podiatry

## 2021-11-05 DIAGNOSIS — I639 Cerebral infarction, unspecified: Secondary | ICD-10-CM

## 2021-11-06 LAB — CUP PACEART REMOTE DEVICE CHECK
Date Time Interrogation Session: 20230605231024
Implantable Pulse Generator Implant Date: 20220802

## 2021-11-08 DIAGNOSIS — M1712 Unilateral primary osteoarthritis, left knee: Secondary | ICD-10-CM | POA: Diagnosis not present

## 2021-11-16 ENCOUNTER — Other Ambulatory Visit: Payer: Self-pay | Admitting: Internal Medicine

## 2021-11-16 NOTE — Telephone Encounter (Signed)
Please refill as per office routine med refill policy (all routine meds to be refilled for 3 mo or monthly (per pt preference) up to one year from last visit, then month to month grace period for 3 mo, then further med refills will have to be denied) ? ?

## 2021-11-19 ENCOUNTER — Ambulatory Visit: Payer: Medicare Other | Admitting: Podiatry

## 2021-11-19 ENCOUNTER — Encounter: Payer: Self-pay | Admitting: Podiatry

## 2021-11-19 DIAGNOSIS — Q828 Other specified congenital malformations of skin: Secondary | ICD-10-CM | POA: Insufficient documentation

## 2021-11-19 DIAGNOSIS — B351 Tinea unguium: Secondary | ICD-10-CM | POA: Diagnosis not present

## 2021-11-19 DIAGNOSIS — M79675 Pain in left toe(s): Secondary | ICD-10-CM | POA: Diagnosis not present

## 2021-11-19 DIAGNOSIS — M79674 Pain in right toe(s): Secondary | ICD-10-CM | POA: Insufficient documentation

## 2021-11-19 DIAGNOSIS — E1165 Type 2 diabetes mellitus with hyperglycemia: Secondary | ICD-10-CM

## 2021-11-19 DIAGNOSIS — Z794 Long term (current) use of insulin: Secondary | ICD-10-CM

## 2021-11-22 DIAGNOSIS — H25813 Combined forms of age-related cataract, bilateral: Secondary | ICD-10-CM | POA: Diagnosis not present

## 2021-11-22 DIAGNOSIS — E119 Type 2 diabetes mellitus without complications: Secondary | ICD-10-CM | POA: Diagnosis not present

## 2021-11-22 DIAGNOSIS — H538 Other visual disturbances: Secondary | ICD-10-CM | POA: Diagnosis not present

## 2021-11-23 NOTE — Progress Notes (Signed)
Carelink Summary Report / Loop Recorder 

## 2021-12-06 LAB — CUP PACEART REMOTE DEVICE CHECK
Date Time Interrogation Session: 20230708230413
Implantable Pulse Generator Implant Date: 20220802

## 2021-12-10 ENCOUNTER — Ambulatory Visit (INDEPENDENT_AMBULATORY_CARE_PROVIDER_SITE_OTHER): Payer: Medicare Other

## 2021-12-10 DIAGNOSIS — I639 Cerebral infarction, unspecified: Secondary | ICD-10-CM | POA: Diagnosis not present

## 2021-12-21 ENCOUNTER — Encounter (HOSPITAL_COMMUNITY): Payer: Self-pay | Admitting: Emergency Medicine

## 2021-12-21 ENCOUNTER — Ambulatory Visit (HOSPITAL_COMMUNITY)
Admission: EM | Admit: 2021-12-21 | Discharge: 2021-12-21 | Disposition: A | Discharge status: Home / Self Care | Payer: Medicare Other | Attending: Internal Medicine | Admitting: Internal Medicine

## 2021-12-21 DIAGNOSIS — T7840XA Allergy, unspecified, initial encounter: Secondary | ICD-10-CM

## 2021-12-21 DIAGNOSIS — T63441A Toxic effect of venom of bees, accidental (unintentional), initial encounter: Secondary | ICD-10-CM

## 2021-12-21 MED ORDER — METHYLPREDNISOLONE SODIUM SUCC 125 MG IJ SOLR
60.0000 mg | Freq: Once | INTRAMUSCULAR | Status: AC
Start: 2021-12-21 — End: 2021-12-21
  Administered 2021-12-21: 60 mg via INTRAMUSCULAR

## 2021-12-21 MED ORDER — FAMOTIDINE 20 MG PO TABS: 20.0000 mg | ORAL_TABLET | Freq: Two times a day (BID) | ORAL | 0 refills | Status: DC

## 2021-12-21 MED ORDER — STERILE WATER FOR INJECTION IJ SOLN
INTRAMUSCULAR | Status: AC
  Filled 2021-12-21: qty 10

## 2021-12-21 MED ORDER — CETIRIZINE HCL 5 MG PO TABS: 5.0000 mg | ORAL_TABLET | Freq: Every day | ORAL | 0 refills | Status: AC

## 2021-12-21 MED ORDER — METHYLPREDNISOLONE SODIUM SUCC 125 MG IJ SOLR
INTRAMUSCULAR | Status: AC
  Filled 2021-12-21: qty 2

## 2021-12-21 NOTE — Discharge Instructions (Signed)
It appears that you are having an allergic reaction.  This is being treated with a steroid shot in urgent care today.  Please monitor your blood sugar more closely for the next few days following steroid injection.  You have also been prescribed 2 different medications to take at home.  Please follow-up if symptoms persist or worsen which include increased redness, swelling, pus.  Also use cool compresses or ice application to affected area.

## 2021-12-21 NOTE — ED Triage Notes (Signed)
Pt reports got bit/stung by something on left forearm yesterday. Took benadryl yesterday, but none today. C/o swelling and redness. Itches and burns too

## 2021-12-21 NOTE — ED Provider Notes (Signed)
[place]    CSN: [id number] Arrival date & time: 12/21/21  4259      History   Chief Complaint Chief Complaint  Patient presents with   Insect Bite    HPI Tiffany [name] is a 71 y.o. female.   Patient presents with possible bee sting to left forearm that occurred yesterday.  Patient reports that she was cleaning her back porch when she noticed a lot of bees and wasps flying around.  She states that she felt something sting/bite her but is not sure what it was.  She has been having increasing swelling and discomfort to left forearm since this occurred.  Denies fever, body aches, chills.  Denies purulent drainage from the area.  She has taken Benadryl with minimal improvement.  Denies feelings of throat closing or shortness of breath.     Past Medical History:  Diagnosis Date   Allergy    Degenerative joint disease    Diabetes mellitus    GERD (gastroesophageal reflux disease)    Headache    Hyperlipidemia    Hypertension    Stroke [facility])     Patient Active Problem List   Diagnosis Date Noted   Porokeratosis 11/19/2021   Pain due to onychomycosis of toenails of both feet 11/19/2021   Acute ischemic left MCA stroke ([place]) 03/29/2021   Epigastric pain 03/11/2021   Ischemic stroke ([place]) 01/17/2021   Intracranial vascular stenosis 01/17/2021   Primary hypertension 01/17/2021   Type 2 diabetes mellitus with hyperglycemia, with long-term current use of insulin ([place]) 01/17/2021   History of completed stroke 10/31/2020   Stroke ([place]) 10/06/2020   Lipoma 08/26/2020   Asthma 08/26/2020   Bilateral leg pain 08/25/2020   Encounter for well adult exam with abnormal findings 05/06/2020   Allergic rhinitis 05/06/2020   Statin myopathy 05/02/2020   Left shoulder pain 11/01/2019   Chronic low back pain 05/03/2019   B12 deficiency 05/03/2019   Left leg swelling 11/16/2018   Left knee pain 11/16/2018   Low back pain 05/14/2018   Abnormal SPEP 11/18/2017    Sternoclavicular joint pain, left 08/19/2017   Cervical radiculopathy at C8 05/15/2017   Pre-ulcerative corn or callous 03/27/2017   Vitamin D deficiency 03/27/2017   Elevated total protein 03/27/2017   Bilateral shoulder pain 03/27/2017   Medicare annual wellness visit, subsequent 03/06/2016   Spondylolisthesis at L4-L5 level 10/06/2015   Acute frontal sinusitis 07/14/2015   Sleep disturbance 07/14/2015   Knee pain, left 11/10/2014   Sprain of ankle 04/11/2014   GERD (gastroesophageal reflux disease) 03/07/2014   Prolapse of vaginal wall with midline cystocele, grade 1 08/23/2013   Vaginal dryness 05/14/2012   Dry mouth 05/13/2012   Depression and insomnia 07/19/2010   Multilevel degenerative disc disease of cervical and lumbar spine  01/16/2008   Essential hypertension 01/01/2008   Diabetes ([place]) 03/18/2007   Hyperlipidemia 02/13/2007   SLEEP APNEA 02/13/2007    Past Surgical History:  Procedure Laterality Date   ABDOMINAL HYSTERECTOMY  1976   BACK SURGERY     KNEE ARTHROSCOPY     left x 2   LUMBAR LAMINECTOMY  1989    OB History   No obstetric history on file.      Home Medications    Prior to Admission medications   Medication Sig Start Date End Date Taking? Authorizing Provider  cetirizine (ZYRTEC) 5 MG tablet Take 1 tablet (5 mg total) by mouth daily. 12/21/21  Yes Teodora Medici, FNP  famotidine (PEPCID) 20 MG tablet Take 1 tablet (20 mg total) by mouth 2 (two) times daily. 12/21/21  Yes Teodora Medici, FNP  Accu-Chek Softclix Lancets lancets Use as directed twice per day E11.9 10/09/21   Biagio Borg, MD  amLODipine (NORVASC) 5 MG tablet TAKE 1 TABLET BY MOUTH EVERY DAY 10/23/21   Biagio Borg, MD  aspirin EC 325 MG EC tablet Take 1 tablet (325 mg total) by mouth daily. 10/09/20   Rosalin Hawking, MD  Blood Glucose Monitoring Suppl (ONE TOUCH ULTRA 2) w/Device KIT Apply 1 Device topically in the morning and at bedtime. E11.9 08/09/19   Biagio Borg, MD  celecoxib  (CELEBREX) 200 MG capsule Take 200 mg by mouth 2 (two) times daily. 03/29/21   [provider]  Cholecalciferol (VITAMIN D) 50 MCG (2000 UT) tablet Take 2,000 Units by mouth daily.    [provider]  EPINEPHrine 0.3 mg/0.3 mL IJ SOAJ injection Inject 0.3 mLs (0.3 mg total) into the muscle as needed for anaphylaxis. 11/01/19   Biagio Borg, MD  ezetimibe (ZETIA) 10 MG tablet TAKE 1 TABLET BY MOUTH EVERY DAY 11/16/21   Biagio Borg, MD  gabapentin (NEURONTIN) 600 MG tablet Take 1 tablet (600 mg total) by mouth 3 (three) times daily. 09/14/21   Biagio Borg, MD  glucose blood [facility] ULTRA) test strip Use as instructed twice per day E11.9 09/14/21   Biagio Borg, MD  insulin glargine (LANTUS SOLOSTAR) 100 UNIT/ML Solostar Pen Inject 55 Units into the skin at bedtime. 07/26/21   Biagio Borg, MD  Insulin Pen Needle 32G X 4 MM MISC Use to inject  lantus once daily Patient taking differently: Use to inject  lantus once daily 03/19/16   [place], FNP  losartan (COZAAR) 100 MG tablet Take 1 tablet by mouth once daily 10/09/21   Biagio Borg, MD  pantoprazole (PROTONIX) 40 MG tablet Take 1 tablet (40 mg total) by mouth daily. 03/09/21   Biagio Borg, MD  pravastatin (PRAVACHOL) 40 MG tablet Take 1 tablet (40 mg total) by mouth daily at 6 PM. 07/26/21   Biagio Borg, MD  tiZANidine (ZANAFLEX) 4 MG tablet TAKE 1 TABLET BY MOUTH EVERY 8 HOURS AS NEEDED FOR MUSCLE SPASM 10/09/21   Biagio Borg, MD  triamcinolone (NASACORT) 55 MCG/ACT AERO nasal inhaler Place 2 sprays into the nose daily. 05/02/20   Biagio Borg, MD  VENTOLIN HFA 108 [phone] Base) MCG/ACT inhaler INHALE 2 PUFFS BY MOUTH EVERY 6 HOURS AS NEEDED FOR WHEEZING FOR SHORTNESS OF BREATH 10/09/21   Biagio Borg, MD  vitamin B-12 (CYANOCOBALAMIN) 1000 MCG tablet Take 1 tablet by mouth once daily 04/09/21   Biagio Borg, MD  zolpidem (AMBIEN) 10 MG tablet TAKE 1 TABLET BY MOUTH AT BEDTIME AS NEEDED 10/09/21   Biagio Borg, MD     Family History Family History  Problem Relation Age of Onset   Hypertension Mother    Diabetes Mother    Thyroid disease Mother    Heart disease Mother    Kidney disease Mother    Diabetes Sister    Hypertension Brother    Stroke Brother    Heart disease Brother    Colon cancer Neg Hx    Colon polyps Neg Hx    Rectal cancer Neg Hx    Stomach cancer Neg Hx     Social History Social History   Tobacco Use  Smoking status: Former    Packs/day: 0.50    Years: 15.00    Total pack years: 7.50    Types: Cigarettes    Quit date: 08/02/1990    Years since quitting: 31.4   Smokeless tobacco: Never  Vaping Use   Vaping Use: Never used  Substance Use Topics   Alcohol use: No    Alcohol/week: 0.0 standard drinks of alcohol   Drug use: No     Allergies   Contrast media [iodinated contrast media], Aspirin, Gadolinium derivatives, Metformin and related, Shellfish allergy, Tramadol, Penicillins, and Statins   Review of Systems Review of Systems Per HPI  Physical Exam Triage Vital Signs ED Triage Vitals  Enc Vitals Group     BP 12/21/21 1004 (!) 155/87     Pulse Rate 12/21/21 1004 100     Resp 12/21/21 1004 18     Temp 12/21/21 1004 98.3 F (36.8 C)     Temp Source 12/21/21 1004 Oral     SpO2 12/21/21 1004 98 %     Weight --      Height --      Head Circumference --      Peak Flow --      Pain Score 12/21/21 1003 9     Pain Loc --      Pain Edu? --      Excl. in [place]? --    No data found.  Updated Vital Signs BP (!) 155/87 (BP Location: Right Arm)   Pulse 100   Temp 98.3 F (36.8 C) (Oral)   Resp 18   SpO2 98%   Visual Acuity Right Eye Distance:   Left Eye Distance:   Bilateral Distance:    Right Eye Near:   Left Eye Near:    Bilateral Near:     Physical Exam Constitutional:      General: She is not in acute distress.    Appearance: Normal appearance. She is not toxic-appearing or diaphoretic.  HENT:     Head: Normocephalic and atraumatic.   Eyes:     Extraocular Movements: Extraocular movements intact.     Conjunctiva/sclera: Conjunctivae normal.  Pulmonary:     Effort: Pulmonary effort is normal.  Skin:    Comments: diffuse area of erythema and mild swelling located to left forearm that extends across majority of forearm.  No obvious purulent drainage or foreign body noted.  Capillary refill and pulses normal.  Patient has full range of motion.  Neurological:     General: No focal deficit present.     Mental Status: She is alert and oriented to person, place, and time. Mental status is at baseline.  Psychiatric:        Mood and Affect: Mood normal.        Behavior: Behavior normal.        Thought Content: Thought content normal.        Judgment: Judgment normal.      UC Treatments / Results  Labs (all labs ordered are listed, but only abnormal results are displayed) Labs Reviewed - No data to display  EKG   Radiology No results found.  Procedures Procedures (including critical care time)  Medications Ordered in UC Medications  methylPREDNISolone sodium succinate (SOLU-MEDROL) 125 mg/2 mL injection 60 mg (has no administration in time range)    Initial Impression / Assessment and Plan / UC Course  I have reviewed the triage vital signs and the nursing notes.  Pertinent labs & imaging results  that were available during my care of the patient were reviewed by me and considered in my medical decision making (see chart for details).     It appears that patient is having an allergic reaction to possible bee sting/bite/spider bite to left forearm.  No signs of anaphylaxis present.  No signs of bacterial infection.  Will treat with IM steroid.  Last A1c was 6.8 so this should be safe but patient was advised to monitor blood sugar more closely for the next few days.  Will prescribe cetirizine 5 mg daily given patient's age and Pepcid twice daily as well.  Creatinine clearance appears to be 78 so no dosage  adjustment necessary.  Patient advised to follow-up if symptoms persist or worsen.  Patient verbalized understanding and was agreeable with plan. Final Clinical Impressions(s) / UC Diagnoses   Final diagnoses:  Allergic reaction, initial encounter  Bee sting, accidental or unintentional, initial encounter     Discharge Instructions      It appears that you are having an allergic reaction.  This is being treated with a steroid shot in urgent care today.  Please monitor your blood sugar more closely for the next few days following steroid injection.  You have also been prescribed 2 different medications to take at home.  Please follow-up if symptoms persist or worsen which include increased redness, swelling, pus.  Also use cool compresses or ice application to affected area.    ED Prescriptions     Medication Sig Dispense Auth. Provider   cetirizine (ZYRTEC) 5 MG tablet Take 1 tablet (5 mg total) by mouth daily. 15 tablet [place], [place], [place]   famotidine (PEPCID) 20 MG tablet Take 1 tablet (20 mg total) by mouth 2 (two) times daily. 30 tablet [place], Michele [place]ers, [place]      PDMP not reviewed this encounter.   Teodora Medici, [place] 12/21/21 1021

## 2021-12-27 DIAGNOSIS — M1712 Unilateral primary osteoarthritis, left knee: Secondary | ICD-10-CM | POA: Diagnosis not present

## 2022-01-10 ENCOUNTER — Encounter: Payer: Self-pay | Admitting: Internal Medicine

## 2022-01-10 ENCOUNTER — Ambulatory Visit (INDEPENDENT_AMBULATORY_CARE_PROVIDER_SITE_OTHER): Payer: Medicare Other | Admitting: Internal Medicine

## 2022-01-10 VITALS — BP 142/80 | HR 82 | Temp 98.4°F | Ht 62.0 in | Wt 158.4 lb

## 2022-01-10 DIAGNOSIS — Z794 Long term (current) use of insulin: Secondary | ICD-10-CM | POA: Diagnosis not present

## 2022-01-10 DIAGNOSIS — E538 Deficiency of other specified B group vitamins: Secondary | ICD-10-CM | POA: Diagnosis not present

## 2022-01-10 DIAGNOSIS — E1165 Type 2 diabetes mellitus with hyperglycemia: Secondary | ICD-10-CM | POA: Diagnosis not present

## 2022-01-10 DIAGNOSIS — E559 Vitamin D deficiency, unspecified: Secondary | ICD-10-CM | POA: Diagnosis not present

## 2022-01-10 DIAGNOSIS — R1084 Generalized abdominal pain: Secondary | ICD-10-CM

## 2022-01-10 MED ORDER — ONETOUCH ULTRA VI STRP: ORAL_STRIP | 3 refills | Status: DC

## 2022-01-10 MED ORDER — LANTUS SOLOSTAR 100 UNIT/ML ~~LOC~~ SOPN: 40.0000 [IU] | PEN_INJECTOR | Freq: Every day | SUBCUTANEOUS | 3 refills | Status: DC

## 2022-01-10 MED ORDER — VITAMIN B-12 1000 MCG PO TABS: 1000.0000 ug | ORAL_TABLET | Freq: Every day | ORAL | 3 refills | Status: DC

## 2022-01-10 MED ORDER — FAMOTIDINE 20 MG PO TABS: 20.0000 mg | ORAL_TABLET | Freq: Two times a day (BID) | ORAL | 3 refills | Status: DC

## 2022-01-10 MED ORDER — EPINEPHRINE 0.3 MG/0.3ML IJ SOAJ: 0.3000 mg | INTRAMUSCULAR | 1 refills | Status: DC | PRN

## 2022-01-10 MED ORDER — VITAMIN B-12 1000 MCG PO TABS
1000.0000 ug | ORAL_TABLET | Freq: Every day | ORAL | 3 refills | Status: DC
Start: 2022-01-10 — End: 2023-01-23

## 2022-01-10 NOTE — Assessment & Plan Note (Signed)
Exam benign, for labs including cbc, lipase, bmp,  to f/u any worsening symptoms or concerns

## 2022-01-10 NOTE — Patient Instructions (Signed)

## 2022-01-10 NOTE — Assessment & Plan Note (Signed)
Lab Results  Component Value Date   HGBA1C 6.8 (H) 09/14/2021   Now overcontrolled, pt to reduce lantus to 40 units qwk

## 2022-01-10 NOTE — Assessment & Plan Note (Signed)
Last vitamin D Lab Results  Component Value Date   VD25OH 51.29 09/14/2021   Stable, cont oral replacement

## 2022-01-10 NOTE — Progress Notes (Signed)
Patient ID: Tiffany [name], female   DOB: [birth year], 71 y.o.   MRN: [id number]        Chief Complaint: follow up DM, abd bloating and mild discomfort, low b12 and Vit D       HPI:  Tiffany [name] is a 71 y.o. female here with c/o trying to work on lower calorie improved DM diet but not able to lose wt due to recurring low sugars several times per week, about 4-5 times.this is despite lowring lantus on her own to 45 units.   Needs new cbg strip rx to qid due to lower sugars much more frequent.  Pt denies chest pain, increased sob or doe, wheezing, orthopnea, PND, increased LE swelling, palpitations, dizziness or syncope.   Pt denies polydipsia, polyuria, or new focal neuro s/s.    Pt denies fever, wt loss, night sweats, loss of appetite, or other constitutional symptoms Denies worsening reflux, abd pain, dysphagia, n/v, bowel change or blood, except for mild bilateral lower abd discomfort and bloating, some improved at times with bowel improvement.         Wt Readings from Last 3 Encounters:  01/10/22 158 lb 6.4 oz (71.8 kg)  09/24/21 158 lb (71.7 kg)  09/14/21 158 lb (71.7 kg)   BP Readings from Last 3 Encounters:  01/10/22 (!) 142/80  12/21/21 (!) 155/87  09/24/21 (!) 157/86         Past Medical History:  Diagnosis Date   Allergy    Degenerative joint disease    Diabetes mellitus    GERD (gastroesophageal reflux disease)    Headache    Hyperlipidemia    Hypertension    Stroke [facility])    Past Surgical History:  Procedure Laterality Date   ABDOMINAL HYSTERECTOMY  1976   BACK SURGERY     KNEE ARTHROSCOPY     left x 2   LUMBAR LAMINECTOMY  1989    reports that she quit smoking about 31 years ago. Her smoking use included cigarettes. She has a 7.50 pack-year smoking history. She has never used smokeless tobacco. She reports that she does not drink alcohol and does not use drugs. family history includes Diabetes in her mother and sister; Heart disease in her brother and mother;  Hypertension in her brother and mother; Kidney disease in her mother; Stroke in her brother; Thyroid disease in her mother. Allergies  Allergen Reactions   Contrast Media [Iodinated Contrast Media] Anaphylaxis, Nausea And Vomiting, Swelling and Other (See Comments)    Patients throat closes with nausea and vomiting    Aspirin Nausea And Vomiting and Other (See Comments)    Can take EC aspirin   Gadolinium Derivatives Nausea And Vomiting   Metformin And Related Nausea And Vomiting and Other (See Comments)    Severe nausea and vomiting   Shellfish Allergy Nausea And Vomiting   Tramadol Nausea And Vomiting and Other (See Comments)    Migraines   Penicillins Nausea And Vomiting   Statins Other (See Comments)    Myalgias    Current Outpatient Medications on File Prior to Visit  Medication Sig Dispense Refill   Accu-Chek Softclix Lancets lancets Use as directed twice per day E11.9 200 each 11   amLODipine (NORVASC) 5 MG tablet TAKE 1 TABLET BY MOUTH EVERY DAY 90 tablet 0   aspirin EC 325 MG EC tablet Take 1 tablet (325 mg total) by mouth daily. 30 tablet 0   Blood Glucose Monitoring Suppl (ONE TOUCH ULTRA 2) w/Device  KIT Apply 1 Device topically in the morning and at bedtime. E11.9 1 kit 0   celecoxib (CELEBREX) 200 MG capsule Take 200 mg by mouth 2 (two) times daily.     cetirizine (ZYRTEC) 5 MG tablet Take 1 tablet (5 mg total) by mouth daily. 15 tablet 0   Cholecalciferol (VITAMIN D) 50 MCG (2000 UT) tablet Take 2,000 Units by mouth daily.     ezetimibe (ZETIA) 10 MG tablet TAKE 1 TABLET BY MOUTH EVERY DAY 90 tablet 3   gabapentin (NEURONTIN) 600 MG tablet Take 1 tablet (600 mg total) by mouth 3 (three) times daily. 90 tablet 5   Insulin Pen Needle 32G X 4 MM MISC Use to inject  lantus once daily (Patient taking differently: Use to inject  lantus once daily) 100 each 4   losartan (COZAAR) 100 MG tablet Take 1 tablet by mouth once daily 90 tablet 3   pantoprazole (PROTONIX) 40 MG tablet  Take 1 tablet (40 mg total) by mouth daily. 90 tablet 3   pravastatin (PRAVACHOL) 40 MG tablet Take 1 tablet (40 mg total) by mouth daily at 6 PM. 90 tablet 2   tiZANidine (ZANAFLEX) 4 MG tablet TAKE 1 TABLET BY MOUTH EVERY 8 HOURS AS NEEDED FOR MUSCLE SPASM 90 tablet 5   triamcinolone (NASACORT) 55 MCG/ACT AERO nasal inhaler Place 2 sprays into the nose daily. 3 each 3   VENTOLIN HFA 108 (90 Base) MCG/ACT inhaler INHALE 2 PUFFS BY MOUTH EVERY 6 HOURS AS NEEDED FOR WHEEZING FOR SHORTNESS OF BREATH 18 g 11   zolpidem (AMBIEN) 10 MG tablet TAKE 1 TABLET BY MOUTH AT BEDTIME AS NEEDED 90 tablet 1   meloxicam (MOBIC) 7.5 MG tablet Take 7.5 mg by mouth daily as needed.     Current Facility-Administered Medications on File Prior to Visit  Medication Dose Route Frequency Provider Last Rate Last Admin   lidocaine-EPINEPHrine (XYLOCAINE W/EPI) 1 %-1:100000 (with pres) injection 10 mL  10 mL Infiltration Once Croitoru, Mihai, MD            ROS:  All others reviewed and negative.  Objective        PE:  BP (!) 142/80 (BP Location: Left Arm, Patient Position: Sitting, Cuff Size: Large)   Pulse 82   Temp 98.4 F (36.9 C) (Oral)   Ht _0  (1.575 m)   Wt 158 lb 6.4 oz (71.8 kg)   SpO2 98%   BMI 28.97 kg/m                 Constitutional: Pt appears in NAD               HENT: Head: NCAT.                Right Ear: External ear normal.                 Left Ear: External ear normal.                Eyes: . Pupils are equal, round, and reactive to light. Conjunctivae and EOM are normal               Nose: without d/c or deformity               Neck: Neck supple. Gross normal ROM               Cardiovascular: Normal rate and regular rhythm.  Pulmonary/Chest: Effort normal and breath sounds without rales or wheezing.                Abd:  Soft, NT, ND, + BS, no organomegaly               Neurological: Pt is alert. At baseline orientation, motor grossly intact               Skin: Skin is  warm. No rashes, no other new lesions, LE edema - none               Psychiatric: Pt behavior is normal without agitation   Micro: none  Cardiac tracings I have personally interpreted today:  none  Pertinent Radiological findings (summarize): none   Lab Results  Component Value Date   WBC 4.5 09/14/2021   HGB 12.8 09/14/2021   HCT 39.1 09/14/2021   PLT 221.0 09/14/2021   GLUCOSE 96 09/14/2021   CHOL 154 09/14/2021   TRIG 56.0 09/14/2021   HDL 63.50 09/14/2021   LDLCALC 80 09/14/2021   ALT 21 09/14/2021   AST 20 09/14/2021   NA 139 09/14/2021   K 3.9 09/14/2021   CL 104 09/14/2021   CREATININE 0.76 09/14/2021   BUN 15 09/14/2021   CO2 27 09/14/2021   TSH 1.03 09/14/2021   INR 1.0 10/06/2020   HGBA1C 6.8 (H) 09/14/2021   MICROALBUR <0.7 09/14/2021   Assessment/Plan:  Tiffany [name] is a 71 y.o. Black or African American [2] female with  has a past medical history of Allergy, Degenerative joint disease, Diabetes mellitus, GERD (gastroesophageal reflux disease), Headache, Hyperlipidemia, Hypertension, and Stroke ([place]).  Vitamin D deficiency Last vitamin D Lab Results  Component Value Date   VD25OH 51.29 09/14/2021   Stable, cont oral replacement   B12 deficiency Lab Results  Component Value Date   VITAMINB12 >1504 (H) 09/14/2021   Stable, cont oral replacement - b12 1000 mcg qd   Diabetes ([place]) Lab Results  Component Value Date   HGBA1C 6.8 (H) 09/14/2021   Now overcontrolled, pt to reduce lantus to 40 units qwk   Abdominal pain, generalized Exam benign, for labs including cbc, lipase, bmp,  to f/u any worsening symptoms or concerns  Followup: Return in about 6 months (around 07/13/2022).  Cathlean Cower, MD 01/10/2022 8:38 PM [facility]

## 2022-01-10 NOTE — Assessment & Plan Note (Signed)
Lab Results  Component Value Date   VITAMINB12 >1504 (H) 09/14/2021   Stable, cont oral replacement - b12 1000 mcg qd

## 2022-01-11 LAB — CBC WITH DIFFERENTIAL/PLATELET
Basophils Absolute: 0 10*3/uL (ref 0.0–0.1)
Basophils Relative: 1 % (ref 0.0–3.0)
Eosinophils Absolute: 0.1 10*3/uL (ref 0.0–0.7)
Eosinophils Relative: 3.2 % (ref 0.0–5.0)
HCT: 38.6 % (ref 36.0–46.0)
Hemoglobin: 12.7 g/dL (ref 12.0–15.0)
Lymphocytes Relative: 30 % (ref 12.0–46.0)
Lymphs Abs: 1.2 10*3/uL (ref 0.7–4.0)
MCHC: 33 g/dL (ref 30.0–36.0)
MCV: 88.3 fl (ref 78.0–100.0)
Monocytes Absolute: 0.3 10*3/uL (ref 0.1–1.0)
Monocytes Relative: 8.6 % (ref 3.0–12.0)
Neutro Abs: 2.3 10*3/uL (ref 1.4–7.7)
Neutrophils Relative %: 57.2 % (ref 43.0–77.0)
Platelets: 236 10*3/uL (ref 150.0–400.0)
RBC: 4.37 Mil/uL (ref 3.87–5.11)
RDW: 13.8 % (ref 11.5–15.5)
WBC: 4.1 10*3/uL (ref 4.0–10.5)

## 2022-01-11 LAB — BASIC METABOLIC PANEL
BUN: 13 mg/dL (ref 6–23)
CO2: 29 mEq/L (ref 19–32)
Calcium: 9.3 mg/dL (ref 8.4–10.5)
Chloride: 106 mEq/L (ref 96–112)
Creatinine, Ser: 0.78 mg/dL (ref 0.40–1.20)
GFR: 76.91 mL/min (ref >60.00)
Glucose, Bld: 104 mg/dL — ABNORMAL HIGH (ref 70–99)
Potassium: 4.2 mEq/L (ref 3.5–5.1)
Sodium: 142 mEq/L (ref 135–145)

## 2022-01-11 LAB — HEPATIC FUNCTION PANEL
ALT: 14 U/L (ref 0–35)
AST: 18 U/L (ref 0–37)
Albumin: 4.3 g/dL (ref 3.5–5.2)
Alkaline Phosphatase: 64 U/L (ref 39–117)
Bilirubin, Direct: 0.1 mg/dL (ref 0.0–0.3)
Total Bilirubin: 0.4 mg/dL (ref 0.2–1.2)
Total Protein: 8 g/dL (ref 6.0–8.3)

## 2022-01-11 LAB — LIPID PANEL
Cholesterol: 165 mg/dL (ref 0–200)
HDL: 58.7 mg/dL (ref >39.00)
LDL Cholesterol: 95 mg/dL (ref 0–99)
NonHDL: 106.61
Total CHOL/HDL Ratio: 3
Triglycerides: 56 mg/dL (ref 0.0–149.0)
VLDL: 11.2 mg/dL (ref 0.0–40.0)

## 2022-01-11 LAB — HEMOGLOBIN A1C: Hgb A1c MFr Bld: 6.9 % — ABNORMAL HIGH (ref 4.6–6.5)

## 2022-01-11 LAB — LIPASE: Lipase: 20 U/L (ref 11.0–59.0)

## 2022-01-11 NOTE — Progress Notes (Signed)
Carelink Summary Report / Loop Recorder 

## 2022-01-14 ENCOUNTER — Ambulatory Visit (INDEPENDENT_AMBULATORY_CARE_PROVIDER_SITE_OTHER): Payer: Medicare Other

## 2022-01-14 DIAGNOSIS — I639 Cerebral infarction, unspecified: Secondary | ICD-10-CM

## 2022-01-15 LAB — CUP PACEART REMOTE DEVICE CHECK
Date Time Interrogation Session: 20230820231127
Implantable Pulse Generator Implant Date: 20220802

## 2022-01-17 ENCOUNTER — Other Ambulatory Visit: Payer: Self-pay | Admitting: Internal Medicine

## 2022-01-17 NOTE — Telephone Encounter (Signed)
Please refill as per office routine med refill policy (all routine meds to be refilled for 3 mo or monthly (per pt preference) up to one year from last visit, then month to month grace period for 3 mo, then further med refills will have to be denied) ? ?

## 2022-01-23 ENCOUNTER — Other Ambulatory Visit: Payer: Self-pay | Admitting: Internal Medicine

## 2022-01-25 ENCOUNTER — Ambulatory Visit: Payer: Self-pay

## 2022-01-25 NOTE — Patient Outreach (Signed)
  Care Coordination   01/25/2022 Name: Tiffany [name] MRN: [id number] DOB: Jul 26, 1950   Care Coordination Outreach Attempts:  Contact was made with the patient today to offer care coordination services as a benefit of their health plan. The patient requested a return call on a later date.   Follow Up Plan:  Additional outreach attempts will be made to offer the patient care coordination information and services.   Encounter Outcome:  Pt. Scheduled  Care Coordination Interventions Activated:  No   Care Coordination Interventions:  No, not indicated    Thea Silversmith, RN, MSN, BSN, CCM Care Coordinator [phone]

## 2022-01-30 ENCOUNTER — Ambulatory Visit: Payer: Self-pay

## 2022-01-30 DIAGNOSIS — I10 Essential (primary) hypertension: Secondary | ICD-10-CM

## 2022-01-30 NOTE — Patient Instructions (Signed)
Visit Information  Thank you for taking time to visit with me today. Please don't hesitate to contact me if I can be of assistance to you.   Following are the goals we discussed today:   Goals Addressed             This Visit's Progress    unable to eat regular food-needs dentures replaced       Care Coordination Interventions: Advised patient to continue to eat soft foods, or blend foods Care Guide referral for community resources, transportation and dental needs/dentures Assessed social determinant of health barriers         Our next appointment is by telephone on 03/21/22 at 2 pm  Please call the care guide team at [phone] if you need to cancel or reschedule your appointment.   If you are experiencing a Mental Health or [place] or need someone to talk to, please call the Suicide and Crisis Lifeline: 988  Patient verbalizes understanding of instructions and care plan provided today and agrees to view in [place]. Active MyChart status and patient understanding of how to access instructions and care plan via MyChart confirmed with patient.      Thea Silversmith, RN, MSN, BSN, CCM Care Coordinator [phone]

## 2022-01-30 NOTE — Patient Outreach (Signed)
  Care Coordination   Initial Visit Note   01/30/2022 Name: Tiffany [name] MRN: [id number] DOB: 21-Feb-1951  Tiffany [name] is a 71 y.o. year old female who sees Tiffany Borg, MD for primary care. I spoke with  Tiffany [name] by phone today.  What matters to the patients health and wellness today?  Unable to eat regular food-needs reports upper partial broke last week and bottom dentures no longer fit.    Goals Addressed             This Visit's Progress    unable to eat regular food-needs dentures replaced       Care Coordination Interventions: Advised patient to continue to eat soft foods, or blend foods Care Guide referral for community resources, transportation and dental needs/dentures Assessed social determinant of health barriers         SDOH assessments and interventions completed:  Yes  SDOH Interventions Today    Flowsheet Row Most Recent Value  SDOH Interventions   Food Insecurity Interventions Intervention Not Indicated  Transportation Interventions Other (Comment)  [patient request transportation resources]  Utilities Interventions Intervention Not Indicated        Care Coordination Interventions Activated:  Yes  Care Coordination Interventions:  Yes, provided   Follow up plan: Follow up call scheduled for 03/21/22    Encounter Outcome:  Pt. Visit Completed   Thea Silversmith, RN, MSN, BSN, [place] Coordinator [phone]

## 2022-01-31 ENCOUNTER — Telehealth: Payer: Self-pay

## 2022-01-31 NOTE — Telephone Encounter (Signed)
   Telephone encounter was:  Successful.  01/31/2022 Name: Tiffany [name] MRN: [id number] DOB: Dec 07, 1950  Tiffany [name] is a 71 y.o. year old female who is a primary care patient of Jenny Reichmann, Hunt Oris, MD . The community resource team was consulted for assistance with  transportation and dental.  Care guide performed the following interventions: Spoke with patient verified home address to send information for [facility] and [place]. Patient has my name and number to call if she does not receive resource letter in the next 7-10 business days. Letter saved in Epic.  Follow Up Plan:  No further follow up planned at this time. The patient has been provided with needed resources.  [place] Resource Care Guide   [name]  ?? [phone]   Website: [email or web address]  [email or web address]  "We don't say no, we SHOW how!"         [facility]

## 2022-02-06 ENCOUNTER — Telehealth: Payer: Self-pay | Admitting: Internal Medicine

## 2022-02-06 NOTE — Telephone Encounter (Signed)
PT calls today in regards to getting their dosage increased for one of their medications. PT would like to know if she can get dosage for her tiZANidine (ZANAFLEX) '4mg'$  increased as she is currently dealing with back spasms in the upper and lower parts of her back. If this can be done she would like this new RX to be sent to the [facility] on PT's chart.  CB if needed: [phone]

## 2022-02-07 NOTE — Telephone Encounter (Signed)
Ok to ask pt to just try her tizanidine 4 mg at 2 pills instead of one at a time, and see if she gets too sleepy or dizzy -  If not let us know, and we can change her rx

## 2022-02-07 NOTE — Telephone Encounter (Signed)
Patient would like an increase on her Tizanidine,LOV 01/10/22

## 2022-02-10 NOTE — Progress Notes (Signed)
Carelink Summary Report / Loop Recorder 

## 2022-02-11 NOTE — Telephone Encounter (Signed)
Patient updated and she will let us know of any changes.

## 2022-02-18 ENCOUNTER — Ambulatory Visit (INDEPENDENT_AMBULATORY_CARE_PROVIDER_SITE_OTHER): Payer: Medicare Other

## 2022-02-18 DIAGNOSIS — I639 Cerebral infarction, unspecified: Secondary | ICD-10-CM | POA: Diagnosis not present

## 2022-02-19 LAB — CUP PACEART REMOTE DEVICE CHECK
Date Time Interrogation Session: 20230922230401
Implantable Pulse Generator Implant Date: 20220802

## 2022-02-25 ENCOUNTER — Other Ambulatory Visit: Payer: Self-pay | Admitting: Internal Medicine

## 2022-02-25 NOTE — Telephone Encounter (Signed)
Please refill as per office routine med refill policy (all routine meds to be refilled for 3 mo or monthly (per pt preference) up to one year from last visit, then month to month grace period for 3 mo, then further med refills will have to be denied) ? ?

## 2022-03-01 NOTE — Progress Notes (Signed)
Carelink Summary Report / Loop Recorder 

## 2022-03-19 ENCOUNTER — Ambulatory Visit (INDEPENDENT_AMBULATORY_CARE_PROVIDER_SITE_OTHER): Payer: Medicare Other | Admitting: Internal Medicine

## 2022-03-19 ENCOUNTER — Encounter: Payer: Self-pay | Admitting: Internal Medicine

## 2022-03-19 VITALS — BP 142/82 | HR 72 | Temp 98.3°F | Ht 62.0 in | Wt 158.0 lb

## 2022-03-19 DIAGNOSIS — M5416 Radiculopathy, lumbar region: Secondary | ICD-10-CM

## 2022-03-19 DIAGNOSIS — M79622 Pain in left upper arm: Secondary | ICD-10-CM | POA: Diagnosis not present

## 2022-03-19 DIAGNOSIS — I10 Essential (primary) hypertension: Secondary | ICD-10-CM

## 2022-03-19 MED ORDER — TRAMADOL HCL 50 MG PO TABS: 50.0000 mg | ORAL_TABLET | Freq: Four times a day (QID) | ORAL | 0 refills | Status: DC | PRN

## 2022-03-19 MED ORDER — CYCLOBENZAPRINE HCL 5 MG PO TABS: 5.0000 mg | ORAL_TABLET | Freq: Three times a day (TID) | ORAL | 1 refills | Status: DC | PRN

## 2022-03-19 NOTE — Assessment & Plan Note (Addendum)
New onset worsening with neuro change in lumbar distribution to RLE , increased fall risk, and will need LS spine MRI, refer NS, tramadol prn pain, and change tizanidine to flexeril prn as also has right lumbar msk spasm

## 2022-03-19 NOTE — Assessment & Plan Note (Signed)
BP Readings from Last 3 Encounters:  03/19/22 (!) 142/82  01/10/22 (!) 142/80  12/21/21 (!) 155/87   uncontrolled, pt to continue medical treatment norvasc 5 mg, losartan 100 mg qd - declines change for now

## 2022-03-19 NOTE — Patient Instructions (Addendum)
Please take all new medication as prescribed - the tramadol for pain as needed (and call for refill if needed)  Ok to change the tizanidine to flexeril 5 mg as needed for muscle relaxer  Please continue all other medications as before, and refills have been done if requested.  Please have the pharmacy call with any other refills you may need.  Please keep your appointments with your specialists as you may have planned  You will be contacted regarding the referral for: MRI Lumbar Spine, and Neurosurgury  Please stop at the [place] desk to make appt with Sports Medicine for the left upper arm pain  No other labs needed today

## 2022-03-19 NOTE — Progress Notes (Signed)
Patient ID: Tiffany [name], female   DOB: 09-Sep-1950, 71 y.o.   MRN: [id number]        Chief Complaint: follow up HTN, right lumbar radiculopathy, left upper arm pain with daugher       HPI:  Tiffany [name] is a 71 y.o. female here with c/o 2 wks acute onset mod to severe right lower back pain with radiation to the left lower abd, but also radicular pain to the distal RLE with numbness and weakness, worse to bend or stand up.  Has hx of lumbar fusion, and last CT lumbar myelogram was c/w s/p fusion but with neurofaminal narrowing possibly significant at right L2.  Has not seen surgury since 2018, and has had only mild intemrittent pain until now.  Pt denies chest pain, increased sob or doe, wheezing, orthopnea, PND, increased LE swelling, palpitations, dizziness or syncope.   Pt denies polydipsia, polyuria, or new focal neuro s/s.    Pt denies fever, wt loss, night sweats, loss of appetite, or other constitutional symptoms  Denies signficiant GI or GU symtpoms.  BP remains mild elevated.  Also has left upper pain worsening in the past month, some at the deltoid insertion site worse to abduct the arm, but also left mid arm with swelling as well, states having more pain there that is not cervical radicular       Wt Readings from Last 3 Encounters:  03/19/22 158 lb (71.7 kg)  01/10/22 158 lb 6.4 oz (71.8 kg)  09/24/21 158 lb (71.7 kg)   BP Readings from Last 3 Encounters:  03/19/22 (!) 142/82  01/10/22 (!) 142/80  12/21/21 (!) 155/87         Past Medical History:  Diagnosis Date   Allergy    Degenerative joint disease    Diabetes mellitus    GERD (gastroesophageal reflux disease)    Headache    Hyperlipidemia    Hypertension    Stroke [facility])    Past Surgical History:  Procedure Laterality Date   ABDOMINAL HYSTERECTOMY  1976   BACK SURGERY     KNEE ARTHROSCOPY     left x 2   LUMBAR LAMINECTOMY  1989    reports that she quit smoking about 31 years ago. Her smoking use included  cigarettes. She has a 7.50 pack-year smoking history. She has never used smokeless tobacco. She reports that she does not drink alcohol and does not use drugs. family history includes Diabetes in her mother and sister; Heart disease in her brother and mother; Hypertension in her brother and mother; Kidney disease in her mother; Stroke in her brother; Thyroid disease in her mother. Allergies  Allergen Reactions   Contrast Media [Iodinated Contrast Media] Anaphylaxis, Nausea And Vomiting, Swelling and Other (See Comments)    Patients throat closes with nausea and vomiting    Aspirin Nausea And Vomiting and Other (See Comments)    Can take EC aspirin   Gadolinium Derivatives Nausea And Vomiting   Metformin And Related Nausea And Vomiting and Other (See Comments)    Severe nausea and vomiting   Shellfish Allergy Nausea And Vomiting   Tramadol Nausea And Vomiting and Other (See Comments)    Migraines   Penicillins Nausea And Vomiting   Statins Other (See Comments)    Myalgias    Current Outpatient Medications on File Prior to Visit  Medication Sig Dispense Refill   Accu-Chek Softclix Lancets lancets Use as directed twice per day E11.9 200 each 11  amLODipine (NORVASC) 5 MG tablet TAKE 1 TABLET BY MOUTH EVERY DAY 90 tablet 0   aspirin EC 325 MG EC tablet Take 1 tablet (325 mg total) by mouth daily. 30 tablet 0   Blood Glucose Monitoring Suppl (ONE TOUCH ULTRA 2) w/Device KIT Apply 1 Device topically in the morning and at bedtime. E11.9 1 kit 0   celecoxib (CELEBREX) 200 MG capsule Take 200 mg by mouth 2 (two) times daily.     cetirizine (ZYRTEC) 5 MG tablet Take 1 tablet (5 mg total) by mouth daily. 15 tablet 0   Cholecalciferol (VITAMIN D) 50 MCG (2000 UT) tablet Take 2,000 Units by mouth daily.     cyanocobalamin (VITAMIN B12) 1000 MCG tablet Take 1 tablet (1,000 mcg total) by mouth daily. 90 tablet 3   EPINEPHrine 0.3 mg/0.3 mL IJ SOAJ injection Inject 0.3 mg into the muscle as needed for  anaphylaxis. 2 each 1   ezetimibe (ZETIA) 10 MG tablet TAKE 1 TABLET BY MOUTH EVERY DAY 90 tablet 3   famotidine (PEPCID) 20 MG tablet Take 1 tablet (20 mg total) by mouth 2 (two) times daily. 180 tablet 3   gabapentin (NEURONTIN) 600 MG tablet Take 1 tablet (600 mg total) by mouth 3 (three) times daily. 90 tablet 5   glucose blood (ONETOUCH ULTRA) test strip Use as instructed four times per day E11.9 400 each 3   insulin glargine (LANTUS SOLOSTAR) 100 UNIT/ML Solostar Pen Inject 40 Units into the skin at bedtime. 100 mL 3   Insulin Pen Needle 32G X 4 MM MISC Use to inject  lantus once daily (Patient taking differently: Use to inject  lantus once daily) 100 each 4   losartan (COZAAR) 100 MG tablet Take 1 tablet by mouth once daily 90 tablet 3   meloxicam (MOBIC) 7.5 MG tablet Take 7.5 mg by mouth daily as needed.     pantoprazole (PROTONIX) 40 MG tablet TAKE 1 TABLET BY MOUTH EVERY DAY 90 tablet 0   pravastatin (PRAVACHOL) 40 MG tablet Take 1 tablet (40 mg total) by mouth daily at 6 PM. 90 tablet 2   triamcinolone (NASACORT) 55 MCG/ACT AERO nasal inhaler Place 2 sprays into the nose daily. 3 each 3   VENTOLIN HFA 108 (90 Base) MCG/ACT inhaler INHALE 2 PUFFS BY MOUTH EVERY 6 HOURS AS NEEDED FOR WHEEZING FOR SHORTNESS OF BREATH 18 g 11   zolpidem (AMBIEN) 10 MG tablet TAKE 1 TABLET BY MOUTH AT BEDTIME AS NEEDED 90 tablet 1   No current facility-administered medications on file prior to visit.        ROS:  All others reviewed and negative.  Objective        PE:  BP (!) 142/82   Pulse 72   Temp 98.3 F (36.8 C)   Ht _0  (1.575 m)   Wt 158 lb (71.7 kg)   SpO2 97%   BMI 28.90 kg/m                 Constitutional: Pt appears in NAD               HENT: Head: NCAT.                Right Ear: External ear normal.                 Left Ear: External ear normal.                Eyes: . Pupils are  equal, round, and reactive to light. Conjunctivae and EOM are normal               Nose: without  d/c or deformity               Neck: Neck supple. Gross normal ROM               Cardiovascular: Normal rate and regular rhythm.                 Pulmonary/Chest: Effort normal and breath sounds without rales or wheezing.                Abd:  Soft, NT, ND, + BS, no organomegaly               Right lower back with marked muscular spasm tender               Neurological: Pt is alert. At baseline orientation, motor with 4/5 RLE weakness (new), gait slowed, unable to get up on exam table even with assist                Skin: Skin is warm. No rashes, no other new lesions, LE edema - none               Psychiatric: Pt behavior is normal without agitation   Micro: none  Cardiac tracings I have personally interpreted today:  none  Pertinent Radiological findings (summarize): none   Lab Results  Component Value Date   WBC 4.1 01/10/2022   HGB 12.7 01/10/2022   HCT 38.6 01/10/2022   PLT 236.0 01/10/2022   GLUCOSE 104 (H) 01/10/2022   CHOL 165 01/10/2022   TRIG 56.0 01/10/2022   HDL 58.70 01/10/2022   LDLCALC 95 01/10/2022   ALT 14 01/10/2022   AST 18 01/10/2022   NA 142 01/10/2022   K 4.2 01/10/2022   CL 106 01/10/2022   CREATININE 0.78 01/10/2022   BUN 13 01/10/2022   CO2 29 01/10/2022   TSH 1.03 09/14/2021   INR 1.0 10/06/2020   HGBA1C 6.9 (H) 01/10/2022   MICROALBUR <0.7 09/14/2021   Assessment/Plan:  Tiffany [name] is a 72 y.o. Black or African American [2] female with  has a past medical history of Allergy, Degenerative joint disease, Diabetes mellitus, GERD (gastroesophageal reflux disease), Headache, Hyperlipidemia, Hypertension, and Stroke ([place]).  Pain in left upper arm ? Tendon rupture vs other - for sport medicine follow up  Pt to ask for appt on first floor today  Essential hypertension BP Readings from Last 3 Encounters:  03/19/22 (!) 142/82  01/10/22 (!) 142/80  12/21/21 (!) 155/87   uncontrolled, pt to continue medical treatment norvasc 5 mg, losartan 100 mg  qd - declines change for now   Right lumbar radiculopathy New onset worsening with neuro change in lumbar distribution to RLE , increased fall risk, and will need LS spine MRI, refer NS, tramadol prn pain, and change tizanidine to flexeril prn as also has right lumbar msk spasm  Followup: Return if symptoms worsen or fail to improve.  Cathlean Cower, MD 03/19/2022 8:18 PM [facility]

## 2022-03-19 NOTE — Assessment & Plan Note (Addendum)
?   Tendon rupture vs other - for sport medicine follow up  Pt to ask for appt on first floor today

## 2022-03-20 ENCOUNTER — Ambulatory Visit: Payer: Medicare Other | Admitting: Sports Medicine

## 2022-03-20 ENCOUNTER — Ambulatory Visit (INDEPENDENT_AMBULATORY_CARE_PROVIDER_SITE_OTHER): Payer: Medicare Other

## 2022-03-20 VITALS — BP 139/82 | HR 82 | Ht 62.0 in | Wt 158.0 lb

## 2022-03-20 DIAGNOSIS — M25512 Pain in left shoulder: Secondary | ICD-10-CM

## 2022-03-20 DIAGNOSIS — M79602 Pain in left arm: Secondary | ICD-10-CM | POA: Diagnosis not present

## 2022-03-20 DIAGNOSIS — M7582 Other shoulder lesions, left shoulder: Secondary | ICD-10-CM | POA: Diagnosis not present

## 2022-03-20 DIAGNOSIS — M79622 Pain in left upper arm: Secondary | ICD-10-CM | POA: Diagnosis not present

## 2022-03-20 NOTE — Progress Notes (Signed)
Tiffany [name] D.[place] Phone: [phone]   Assessment and Plan:     1. Acute pain of left shoulder 2. Rotator cuff tendonitis, left 3. Left arm pain  -Acute, worsening, initial sports medicine visit - Left shoulder pain for 4 to 5 weeks as progressed to left upper arm pain.  Most consistent with rotator cuff tendinitis versus subacromial bursitis leading to excess strain through upper arm - Patient has been taking Celebrex twice daily without relief of symptoms, so has failed NSAID treatment course - Patient elected for subacromial CSI.  Tolerated well per note below. - Start HEP.  Recommended physical therapy, but patient wishes to start with HEP first - X-ray obtained in clinic.  My interpretation: No acute fracture or dislocation of shoulder.  Mild cortical changes in glenohumeral joint.  Procedure: Subacromial Injection Side: Left  Risks explained and consent was given verbally. The site was cleaned with alcohol prep. A steroid injection was performed from posterior approach using 47m of 1% lidocaine without epinephrine and 161mof kenalog 4027ml. This was well tolerated and resulted in symptomatic relief.  Needle was removed, hemostasis achieved, and post injection instructions were explained.   Pt was advised to call or return to clinic if these symptoms worsen or fail to improve as anticipated.   Pertinent previous records reviewed include none   Follow Up: 3 to 4 weeks for reevaluation.  If upper arm is still in pain, could consider ultrasound.  If shoulder is not pain, would consider physical therapy versus advanced imaging versus ultrasound   Subjective:   I, Tiffany [name], am serving as a scrEducation administratorr Doctor BenGlennon Machief Complaint: left arm pain   HPI:   03/20/22 Patient is a 71 59ar old female complaining of left arm pain. Patient states left upper arm  pain worsening in the past  month, some at the deltoid insertion site worse to abduct the arm, but also left mid arm with swelling as well, states having more pain there that is not cervical radicular, she had a fall maybe a foosh, no meds for the pain , no numbness or tingling , decreased ROM left arm bothers her a lot she has a hard time sleeping due to the swelling and the pain     Relevant Historical Information: DM type II, GERD, history of stroke, hypertension,  Additional pertinent review of systems negative.   Current Outpatient Medications:    Accu-Chek Softclix Lancets lancets, Use as directed twice per day E11.9, Disp: 200 each, Rfl: 11   amLODipine (NORVASC) 5 MG tablet, TAKE 1 TABLET BY MOUTH EVERY DAY, Disp: 90 tablet, Rfl: 0   aspirin EC 325 MG EC tablet, Take 1 tablet (325 mg total) by mouth daily., Disp: 30 tablet, Rfl: 0   Blood Glucose Monitoring Suppl (ONE TOUCH ULTRA 2) w/Device KIT, Apply 1 Device topically in the morning and at bedtime. E11.9, Disp: 1 kit, Rfl: 0   celecoxib (CELEBREX) 200 MG capsule, Take 200 mg by mouth 2 (two) times daily., Disp: , Rfl:    cetirizine (ZYRTEC) 5 MG tablet, Take 1 tablet (5 mg total) by mouth daily., Disp: 15 tablet, Rfl: 0   Cholecalciferol (VITAMIN D) 50 MCG (2000 UT) tablet, Take 2,000 Units by mouth daily., Disp: , Rfl:    cyanocobalamin (VITAMIN B12) 1000 MCG tablet, Take 1 tablet (1,000 mcg total) by mouth daily., Disp: 90 tablet, Rfl: 3   cyclobenzaprine (  FLEXERIL) 5 MG tablet, Take 1 tablet (5 mg total) by mouth 3 (three) times daily as needed for muscle spasms., Disp: 40 tablet, Rfl: 1   EPINEPHrine 0.3 mg/0.3 mL IJ SOAJ injection, Inject 0.3 mg into the muscle as needed for anaphylaxis., Disp: 2 each, Rfl: 1   ezetimibe (ZETIA) 10 MG tablet, TAKE 1 TABLET BY MOUTH EVERY DAY, Disp: 90 tablet, Rfl: 3   famotidine (PEPCID) 20 MG tablet, Take 1 tablet (20 mg total) by mouth 2 (two) times daily., Disp: 180 tablet, Rfl: 3   gabapentin (NEURONTIN) 600 MG tablet,  Take 1 tablet (600 mg total) by mouth 3 (three) times daily., Disp: 90 tablet, Rfl: 5   glucose blood (ONETOUCH ULTRA) test strip, Use as instructed four times per day E11.9, Disp: 400 each, Rfl: 3   insulin glargine (LANTUS SOLOSTAR) 100 UNIT/ML Solostar Pen, Inject 40 Units into the skin at bedtime., Disp: 100 mL, Rfl: 3   Insulin Pen Needle 32G X 4 MM MISC, Use to inject  lantus once daily (Patient taking differently: Use to inject  lantus once daily), Disp: 100 each, Rfl: 4   losartan (COZAAR) 100 MG tablet, Take 1 tablet by mouth once daily, Disp: 90 tablet, Rfl: 3   meloxicam (MOBIC) 7.5 MG tablet, Take 7.5 mg by mouth daily as needed., Disp: , Rfl:    pantoprazole (PROTONIX) 40 MG tablet, TAKE 1 TABLET BY MOUTH EVERY DAY, Disp: 90 tablet, Rfl: 0   pravastatin (PRAVACHOL) 40 MG tablet, Take 1 tablet (40 mg total) by mouth daily at 6 PM., Disp: 90 tablet, Rfl: 2   traMADol (ULTRAM) 50 MG tablet, Take 1 tablet (50 mg total) by mouth every 6 (six) hours as needed., Disp: 30 tablet, Rfl: 0   triamcinolone (NASACORT) 55 MCG/ACT AERO nasal inhaler, Place 2 sprays into the nose daily., Disp: 3 each, Rfl: 3   VENTOLIN HFA 108 (90 Base) MCG/ACT inhaler, INHALE 2 PUFFS BY MOUTH EVERY 6 HOURS AS NEEDED FOR WHEEZING FOR SHORTNESS OF BREATH, Disp: 18 g, Rfl: 11   zolpidem (AMBIEN) 10 MG tablet, TAKE 1 TABLET BY MOUTH AT BEDTIME AS NEEDED, Disp: 90 tablet, Rfl: 1   Objective:     Vitals:   03/20/22 1444  BP: 139/82  Pulse: 82  SpO2: 99%  Weight: 158 lb (71.7 kg)  Height: 5' 2"  (1.575 m)      Body mass index is 28.9 kg/m.    Physical Exam:    Gen: Appears well, nad, nontoxic and pleasant Neuro:sensation intact, strength is 5/5 with df/pf/inv/ev, muscle tone wnl Skin: no suspicious lesion or defmority Psych: A&O, appropriate mood and affect  Left shoulder: no deformity, swelling or muscle wasting No scapular winging FF 160, abd 90, int 10, ext 70 TTP deltoid, humerus NTTP over the [place],  clavicle, ac, coracoid, biceps groove,   trapezius, cervical spine Positive Neer, Hawking's, empty can, O'Brien Neg   subscap liftoff, speeds,  Neg ant drawer, sulcus sign, apprehension Negative Spurling's test bilat FROM of neck    Electronically signed by:  Tiffany [name] D.[facility] 4:06 PM 03/20/22

## 2022-03-20 NOTE — Patient Instructions (Addendum)
Good to see you  Shoulder HEP  3-4 week follow up

## 2022-03-21 ENCOUNTER — Ambulatory Visit: Payer: Self-pay

## 2022-03-21 NOTE — Patient Instructions (Signed)
Visit Information  Thank you for taking time to visit with me today. Please don't hesitate to contact me if I can be of assistance to you.   Following are the goals we discussed today:   Goals Addressed             This Visit's Progress    improvement in pain       Care Coordination Interventions: patient reports acute pain Left shoulder and chronic pain to back Reviewed medications and encouraged to take as prescribed Reviewed upcoming appointment Encouraged to perform exercises as provided by sports medicine provider Discussed importance of fall prevention Encouraged to contact provider if symptoms do not improve or worsen      unable to eat regular food-needs dentures replaced       Care Coordination Interventions: Confirmed patient been fitted for dentures and has received them.        Our next appointment is by telephone on 04/23/22 at 2:00 pm  Please call the care guide team at [phone] if you need to cancel or reschedule your appointment.   If you are experiencing a Mental Health or [place] or need someone to talk to, please call the Suicide and Crisis Lifeline: 988  Patient verbalizes understanding of instructions and care plan provided today and agrees to view in [place]. Active MyChart status and patient understanding of how to access instructions and care plan via MyChart confirmed with patient.     Thea Silversmith, RN, MSN, BSN, CCM [facility] Care Coordinator [phone]   Understanding Your Risk for Falls Each year, millions of people have serious injuries from falls. It is important to understand your risk for falling. Talk with your health care provider about your risk and what you can do to lower it. There are actions you can take at home to lower your risk and prevent falls. If you do have a serious fall, make sure to tell your health care provider. Falling once raises your risk of falling again. How can falls affect me? Serious injuries from  falls are common. These include: Broken bones, such as hip fractures. Head injuries, such as traumatic brain injuries (TBI) or concussion. A fear of falling can cause you to avoid activities and stay at home. This can make your muscles weaker and actually raise your risk for a fall. What can increase my risk? There are a number of risk factors that increase your risk for falling. The more risk factors you have, the higher your risk of falling. Serious injuries from a fall happen most often to people older than age 37. Children and young adults ages 43-29 are also at higher risk. Common risk factors include: Weakness in the lower body. Lack (deficiency) of vitamin D. Being generally weak or confused due to long-term (chronic) illness. Dizziness or balance problems. Poor vision. Medicines that cause dizziness or drowsiness. These can include medicines for your blood pressure, heart, anxiety, insomnia, or edema, as well as pain medicines and muscle relaxants. Other risk factors include: Drinking alcohol. Having had a fall in the past. Having depression. Having foot pain or wearing improper footwear. Working at a dangerous job. Having any of the following in your home: Tripping hazards, such as floor clutter or loose rugs. Poor lighting. Pets. Having dementia or memory loss. What actions can I take to lower my risk of falling?     Physical activity Maintain physical fitness. Do strength and balance exercises. Consider taking a regular class to build strength and balance.  Yoga and tai chi are good options. Vision Have your eyes checked every year and your vision prescription updated as needed. Walking aids and footwear Wear nonskid shoes. Do not wear high heels. Do not walk around the house in socks or slippers. Use a cane or walker as told by your health care provider. Home safety Attach secure railings on both sides of your stairs. Install grab bars for your tub, shower, and  toilet. Use a bath mat in your tub or shower. Use good lighting in all rooms. Keep a flashlight near your bed. Make sure there is a clear path from your bed to the bathroom. Use night-lights. Do not use throw rugs. Make sure all carpeting is taped or tacked down securely. Remove all clutter from walkways and stairways, including extension cords. Repair uneven or broken steps. Avoid walking on icy or slippery surfaces. Walk on the grass instead of on icy or slick sidewalks. Use ice melt to get rid of ice on walkways. Use a cordless phone. Questions to ask your health care provider Can you help me check my risk for a fall? Do any of my medicines make me more likely to fall? Should I take a vitamin D supplement? What exercises can I do to improve my strength and balance? Should I make an appointment to have my vision checked? Do I need a bone density test to check for weak bones or osteoporosis? Would it help to use a cane or a walker? Where to find more information Centers for Disease Control and Prevention, STEADI: http://www.wolf.info/ Community-Based Fall Prevention Programs: http://www.wolf.info/ National Institute on Aging: http://kim-miller.com/ Contact a health care provider if: You fall at home. You are afraid of falling at home. You feel weak, drowsy, or dizzy. Summary Serious injuries from a fall happen most often to people older than age 64. Children and young adults ages 43-29 are also at higher risk. Talk with your health care provider about your risks for falling and how to lower those risks. Taking certain precautions at home can lower your risk for falling. If you fall, always tell your health care provider. This information is not intended to replace advice given to you by your health care provider. Make sure you discuss any questions you have with your health care provider. Document Revised: 12/15/2019 Document Reviewed: 12/15/2019 Elsevier Patient Education  [place].

## 2022-03-21 NOTE — Patient Outreach (Signed)
  Care Coordination   Follow Up Visit Note   03/21/2022 Name: Tiffany [name] MRN: [id number] DOB: 10-19-1950  Tiffany [name] is a 71 y.o. year old female who sees Biagio Borg, MD for primary care. I spoke with  [name] by phone today.  What matters to the patients health and wellness today?  Patient reports denture benefits through her insurance and now has dentures. Patient reports Left shoulder/left arm pain. Office visit with sports medicine provider on 03/20/22-per chart review, acute pain/rotator cuff tendonitis. She reports exercises given to her to perform. She rates pain "6/10".  She also reports pain to Back that begins around upper mid-back and down. She reports this pain is chronic rates as "9/10". She reports she has medications that help the pain.    Goals Addressed             This Visit's Progress    improvement in pain       Care Coordination Interventions: patient reports acute pain Left shoulder and chronic pain to back Reviewed medications and encouraged to take as prescribed Reviewed upcoming appointment Encouraged to perform exercises as provided by sports medicine provider Discussed importance of fall prevention Encouraged to contact provider if symptoms do not improve or worsen      COMPLETED: unable to eat regular food-needs dentures replaced       Care Coordination Interventions: Confirmed patient been fitted for dentures and has received them.        SDOH assessments and interventions completed:  No  Care Coordination Interventions Activated:  Yes  Care Coordination Interventions:  Yes, provided   Follow up plan: Follow up call scheduled for 04/23/22    Encounter Outcome:  Pt. Visit Completed   Thea Silversmith, RN, MSN, BSN, [place] Coordinator [phone]

## 2022-03-25 ENCOUNTER — Ambulatory Visit: Payer: Medicare Other

## 2022-04-09 NOTE — Progress Notes (Unsigned)
Tiffany [name] D.[place] Phone: [phone]   Assessment and Plan:     There are no diagnoses linked to this encounter.  ***   Pertinent previous records reviewed include ***   Follow Up: ***     Subjective:   I, Tiffany [name], am serving as a Education administrator for Doctor Glennon Mac   Chief Complaint: left arm pain    HPI:    03/20/22 Patient is a 71 year old female complaining of left arm pain. Patient states left upper arm  pain worsening in the past month, some at the deltoid insertion site worse to abduct the arm, but also left mid arm with swelling as well, states having more pain there that is not cervical radicular, she had a fall maybe a foosh, no meds for the pain , no numbness or tingling , decreased ROM left arm bothers her a lot she has a hard time sleeping due to the swelling and the pain     04/10/2022 Patient states    Relevant Historical Information: DM type II, GERD, history of stroke, hypertension,  Additional pertinent review of systems negative.   Current Outpatient Medications:    Accu-Chek Softclix Lancets lancets, Use as directed twice per day E11.9, Disp: 200 each, Rfl: 11   amLODipine (NORVASC) 5 MG tablet, TAKE 1 TABLET BY MOUTH EVERY DAY, Disp: 90 tablet, Rfl: 0   aspirin EC 325 MG EC tablet, Take 1 tablet (325 mg total) by mouth daily., Disp: 30 tablet, Rfl: 0   Blood Glucose Monitoring Suppl (ONE TOUCH ULTRA 2) w/Device KIT, Apply 1 Device topically in the morning and at bedtime. E11.9, Disp: 1 kit, Rfl: 0   celecoxib (CELEBREX) 200 MG capsule, Take 200 mg by mouth 2 (two) times daily., Disp: , Rfl:    cetirizine (ZYRTEC) 5 MG tablet, Take 1 tablet (5 mg total) by mouth daily., Disp: 15 tablet, Rfl: 0   Cholecalciferol (VITAMIN D) 50 MCG (2000 UT) tablet, Take 2,000 Units by mouth daily., Disp: , Rfl:    cyanocobalamin (VITAMIN B12) 1000 MCG tablet, Take 1 tablet (1,000  mcg total) by mouth daily., Disp: 90 tablet, Rfl: 3   cyclobenzaprine (FLEXERIL) 5 MG tablet, Take 1 tablet (5 mg total) by mouth 3 (three) times daily as needed for muscle spasms., Disp: 40 tablet, Rfl: 1   EPINEPHrine 0.3 mg/0.3 mL IJ SOAJ injection, Inject 0.3 mg into the muscle as needed for anaphylaxis., Disp: 2 each, Rfl: 1   ezetimibe (ZETIA) 10 MG tablet, TAKE 1 TABLET BY MOUTH EVERY DAY, Disp: 90 tablet, Rfl: 3   famotidine (PEPCID) 20 MG tablet, Take 1 tablet (20 mg total) by mouth 2 (two) times daily., Disp: 180 tablet, Rfl: 3   gabapentin (NEURONTIN) 600 MG tablet, Take 1 tablet (600 mg total) by mouth 3 (three) times daily., Disp: 90 tablet, Rfl: 5   glucose blood (ONETOUCH ULTRA) test strip, Use as instructed four times per day E11.9, Disp: 400 each, Rfl: 3   insulin glargine (LANTUS SOLOSTAR) 100 UNIT/ML Solostar Pen, Inject 40 Units into the skin at bedtime., Disp: 100 mL, Rfl: 3   Insulin Pen Needle 32G X 4 MM MISC, Use to inject  lantus once daily (Patient taking differently: Use to inject  lantus once daily), Disp: 100 each, Rfl: 4   losartan (COZAAR) 100 MG tablet, Take 1 tablet by mouth once daily (Patient not taking: Reported on 03/21/2022), Disp: 90  tablet, Rfl: 3   meloxicam (MOBIC) 7.5 MG tablet, Take 7.5 mg by mouth daily as needed., Disp: , Rfl:    pantoprazole (PROTONIX) 40 MG tablet, TAKE 1 TABLET BY MOUTH EVERY DAY, Disp: 90 tablet, Rfl: 0   pravastatin (PRAVACHOL) 40 MG tablet, Take 1 tablet (40 mg total) by mouth daily at 6 PM., Disp: 90 tablet, Rfl: 2   traMADol (ULTRAM) 50 MG tablet, Take 1 tablet (50 mg total) by mouth every 6 (six) hours as needed., Disp: 30 tablet, Rfl: 0   triamcinolone (NASACORT) 55 MCG/ACT AERO nasal inhaler, Place 2 sprays into the nose daily., Disp: 3 each, Rfl: 3   VENTOLIN HFA 108 (90 Base) MCG/ACT inhaler, INHALE 2 PUFFS BY MOUTH EVERY 6 HOURS AS NEEDED FOR WHEEZING FOR SHORTNESS OF BREATH, Disp: 18 g, Rfl: 11   zolpidem (AMBIEN) 10 MG  tablet, TAKE 1 TABLET BY MOUTH AT BEDTIME AS NEEDED, Disp: 90 tablet, Rfl: 1   Objective:     There were no vitals filed for this visit.    There is no height or weight on file to calculate BMI.    Physical Exam:    ***   Electronically signed by:  Tiffany [name] D.[name] 7:40 AM 04/09/22

## 2022-04-10 ENCOUNTER — Ambulatory Visit: Payer: Medicare Other | Admitting: Sports Medicine

## 2022-04-10 VITALS — HR 58 | Ht 62.0 in | Wt 158.0 lb

## 2022-04-10 DIAGNOSIS — M7582 Other shoulder lesions, left shoulder: Secondary | ICD-10-CM

## 2022-04-10 DIAGNOSIS — G8929 Other chronic pain: Secondary | ICD-10-CM

## 2022-04-10 DIAGNOSIS — M79602 Pain in left arm: Secondary | ICD-10-CM | POA: Diagnosis not present

## 2022-04-10 DIAGNOSIS — M25512 Pain in left shoulder: Secondary | ICD-10-CM

## 2022-04-10 MED ORDER — CYCLOBENZAPRINE HCL 10 MG PO TABS: 10.0000 mg | ORAL_TABLET | Freq: Every day | ORAL | 0 refills | Status: DC

## 2022-04-10 NOTE — Patient Instructions (Addendum)
Good to see you  Flexeril 10 mg nightly as needed for muscle spasms  Mri left shoulder  Follow up 3 days after MRI to discuss results

## 2022-04-16 ENCOUNTER — Other Ambulatory Visit: Payer: Self-pay | Admitting: Internal Medicine

## 2022-04-16 NOTE — Telephone Encounter (Signed)
Please refill as per office routine med refill policy (all routine meds to be refilled for 3 mo or monthly (per pt preference) up to one year from last visit, then month to month grace period for 3 mo, then further med refills will have to be denied) ? ?

## 2022-04-19 ENCOUNTER — Other Ambulatory Visit: Payer: Self-pay | Admitting: Internal Medicine

## 2022-04-23 ENCOUNTER — Telehealth: Payer: Self-pay | Admitting: Sports Medicine

## 2022-04-23 ENCOUNTER — Other Ambulatory Visit: Payer: Self-pay | Admitting: Sports Medicine

## 2022-04-23 ENCOUNTER — Ambulatory Visit: Payer: Self-pay

## 2022-04-23 ENCOUNTER — Other Ambulatory Visit: Payer: Self-pay | Admitting: Internal Medicine

## 2022-04-23 DIAGNOSIS — M25512 Pain in left shoulder: Secondary | ICD-10-CM

## 2022-04-23 DIAGNOSIS — M7582 Other shoulder lesions, left shoulder: Secondary | ICD-10-CM

## 2022-04-23 DIAGNOSIS — Z1231 Encounter for screening mammogram for malignant neoplasm of breast: Secondary | ICD-10-CM

## 2022-04-23 DIAGNOSIS — M79602 Pain in left arm: Secondary | ICD-10-CM

## 2022-04-23 DIAGNOSIS — G8929 Other chronic pain: Secondary | ICD-10-CM

## 2022-04-23 NOTE — Telephone Encounter (Signed)
Patient called stating that she thought that the MRI was going to be for her shoulder and arm.  Is this correct? If so, the left arm will need to be ordered as well.

## 2022-04-23 NOTE — Telephone Encounter (Signed)
Patient said that she would like an MRI of her arm done as well.  She mentioned that there was a place on it that was giving her trouble and this was mentioned to Dr Glennon Mac as well.

## 2022-04-23 NOTE — Patient Outreach (Signed)
  Care Coordination   Follow Up Visit Note   04/23/2022 Name: Kortnie Stovall MRN: [id number] DOB: [birth year]  Kinzee Happel is a 71 y.o. year old female who sees Biagio Borg, MD for primary care. I spoke with  [name] by phone today.  What matters to the patients health and wellness today?  Mrs. Jain reports continues to be followed for left shoulder pain per Sports medicine and plans to have an MRI of shoulder/arm. Reports chronic back pain ongoing and states primary care provider has ordered an MRI schedule for 04/25/22. Mrs. Cassell reports she needs to call to schedule MRI shoulder and will call once she gets off the phone(ordered by Sports medicine).   Goals Addressed             This Visit's Progress    Care Coordination-improvement in pain       Care Coordination Interventions:  Reviewed date/time of schedule imaging MRI, encouraged to contact imaging to schedule MRI for Left shoulder/arm ordered by Sports medicine. Provided address and contact number for [facility] imaging [address] [place]. [phone] Encouraged to continue to follow up with provider as recommended Reviewed medications and encouraged to take as prescribed Encouraged patient to contact Sports medicine per instructions re: MRI of shoulder and 3 day follow up post MRI Encouraged to continue exercises per recommendation of sports medicine provider Encouraged to contact provider if symptoms do not improve or worsen         SDOH assessments and interventions completed:  No  Care Coordination Interventions:  Yes, provided   Follow up plan: Follow up call scheduled for 05/13/22    Encounter Outcome:  Pt. Visit Completed   Thea Silversmith, RN, MSN, BSN, [place] Coordinator [phone]

## 2022-04-23 NOTE — Telephone Encounter (Signed)
New referral for left humerus was placed

## 2022-04-23 NOTE — Progress Notes (Unsigned)
New referral for left arm was placed

## 2022-04-23 NOTE — Patient Instructions (Addendum)
Visit Information  Thank you for taking time to visit with me today. Please don't hesitate to contact me if I can be of assistance to you.   Following are the goals we discussed today:   Goals Addressed             This Visit's Progress    Care Coordination-improvement in pain       Care Coordination Interventions:  Reviewed date/time of schedule imaging MRI, encouraged to contact imaging to schedule MRI for Left shoulder/arm ordered by Sports medicine. Provided address and contact number for [facility] imaging [address] [place]. [phone] Encouraged to continue to follow up with provider as recommended Reviewed medications and encouraged to take as prescribed Encouraged patient to contact Sports medicine per instructions re: MRI of shoulder and 3 day follow up post MRI Encouraged to continue exercises per recommendation of sports medicine provider Encouraged to contact provider if symptoms do not improve or worsen       Our next appointment is by telephone on 05/13/22 at 1:15pm  Please call the care guide team at [phone] if you need to cancel or reschedule your appointment.   If you are experiencing a Mental Health or [place] or need someone to talk to, please call the Suicide and Crisis Lifeline: 988  Patient verbalizes understanding of instructions and care plan provided today and agrees to view in [place]. Active MyChart status and patient understanding of how to access instructions and care plan via MyChart confirmed with patient.     Thea Silversmith, RN, MSN, BSN, [place] Coordinator [phone]

## 2022-04-24 ENCOUNTER — Encounter: Payer: Self-pay | Admitting: *Deleted

## 2022-04-25 ENCOUNTER — Ambulatory Visit
Admission: RE | Admit: 2022-04-25 | Discharge: 2022-04-25 | Disposition: A | Discharge status: Home / Self Care | Payer: Medicare Other | Source: Ambulatory Visit | Attending: Internal Medicine | Admitting: Internal Medicine

## 2022-04-25 DIAGNOSIS — M5416 Radiculopathy, lumbar region: Secondary | ICD-10-CM

## 2022-04-29 ENCOUNTER — Ambulatory Visit (INDEPENDENT_AMBULATORY_CARE_PROVIDER_SITE_OTHER): Payer: Medicare Other

## 2022-04-29 DIAGNOSIS — I639 Cerebral infarction, unspecified: Secondary | ICD-10-CM

## 2022-04-29 LAB — CUP PACEART REMOTE DEVICE CHECK
Date Time Interrogation Session: 20231203232051
Implantable Pulse Generator Implant Date: 20220802

## 2022-05-14 ENCOUNTER — Other Ambulatory Visit: Payer: Self-pay | Admitting: Internal Medicine

## 2022-05-14 ENCOUNTER — Ambulatory Visit: Payer: Self-pay

## 2022-05-14 NOTE — Patient Outreach (Signed)
  Care Coordination   Follow Up Visit Note   05/14/2022 Name: Tiffany [name] MRN: [id number] DOB: 1950/07/18  Tiffany [name] is a 71 y.o. year old female who sees Tiffany Borg, MD for primary care. I spoke with  Tiffany [name] by phone today.  What matters to the patients health and wellness today?  Patient reports MRI ordered by sports medicine provider and is scheduled for MRI 05/24/22. Reports no improvement in left shoulder/arm. She states she is unable to pick up anything and reports limited mobility of her arm. Patient states she will call sports medicine office to inform of worsening pain and schedule an appointment if needed.    Goals Addressed             This Visit's Progress    Care Coordination-improvement in pain       Care Coordination Interventions:  Advised patient to contact sports medicine provider to notify of worsening shoulder pain and movement of shoulder Scheduled follow up telephone call for next month to discuss any additional care coordination needs       SDOH assessments and interventions completed:  No  Care Coordination Interventions:  Yes, provided   Follow up plan: Follow up call scheduled for 06/24/22    Encounter Outcome:  Pt. Visit Completed   Thea Silversmith, RN, MSN, BSN, [place] Coordinator [phone]

## 2022-05-14 NOTE — Telephone Encounter (Signed)
Please refill as per office routine med refill policy (all routine meds to be refilled for 3 mo or monthly (per pt preference) up to one year from last visit, then month to month grace period for 3 mo, then further med refills will have to be denied) ? ?

## 2022-05-14 NOTE — Patient Instructions (Signed)
Visit Information  Thank you for taking time to visit with me today. Please don't hesitate to contact me if I can be of assistance to you.   Following are the goals we discussed today:   Goals Addressed             This Visit's Progress    Care Coordination-improvement in pain       Care Coordination Interventions:  Advised patient to contact sports medicine provider to notify of worsening shoulder pain and movement of shoulder Scheduled follow up telephone call for next month to discuss any additional care coordination needs       Our next appointment is by telephone on 06/24/22 at 2:00 pm  Please call the care guide team at [phone] if you need to cancel or reschedule your appointment.   If you are experiencing a Mental Health or [place] or need someone to talk to, please call the Suicide and Crisis Lifeline: [place], RN, MSN, BSN, [place] [phone]

## 2022-05-22 ENCOUNTER — Telehealth: Payer: Self-pay | Admitting: Sports Medicine

## 2022-05-22 NOTE — Telephone Encounter (Signed)
Left message for patient to call back  

## 2022-05-22 NOTE — Telephone Encounter (Signed)
Patient called back and gave Dr Marisue Brooklyn message. She has been taking most of what is mentioned and will add in the Tylenol.    She is also scheduled for her MRI on Friday.

## 2022-05-22 NOTE — Telephone Encounter (Signed)
Patient called asking if something could be sent in for her for pain? She said that she is still having a lot of pain in her arm and it is unbearable.  Please advise.

## 2022-05-24 ENCOUNTER — Ambulatory Visit
Admission: RE | Admit: 2022-05-24 | Discharge: 2022-05-24 | Disposition: A | Discharge status: Home / Self Care | Payer: Medicare Other | Source: Ambulatory Visit | Attending: Sports Medicine | Admitting: Sports Medicine

## 2022-05-24 ENCOUNTER — Encounter: Payer: Self-pay | Admitting: Radiology

## 2022-05-24 DIAGNOSIS — M79602 Pain in left arm: Secondary | ICD-10-CM

## 2022-05-24 DIAGNOSIS — M7582 Other shoulder lesions, left shoulder: Secondary | ICD-10-CM

## 2022-05-24 DIAGNOSIS — G8929 Other chronic pain: Secondary | ICD-10-CM

## 2022-05-24 DIAGNOSIS — M79622 Pain in left upper arm: Secondary | ICD-10-CM | POA: Diagnosis not present

## 2022-05-24 DIAGNOSIS — M25512 Pain in left shoulder: Secondary | ICD-10-CM

## 2022-06-03 ENCOUNTER — Ambulatory Visit (INDEPENDENT_AMBULATORY_CARE_PROVIDER_SITE_OTHER): Payer: BLUE CROSS/BLUE SHIELD

## 2022-06-03 DIAGNOSIS — I639 Cerebral infarction, unspecified: Secondary | ICD-10-CM

## 2022-06-04 LAB — CUP PACEART REMOTE DEVICE CHECK
Date Time Interrogation Session: 20240107232607
Implantable Pulse Generator Implant Date: 20220802

## 2022-06-06 NOTE — Progress Notes (Signed)
Carelink Summary Report / Loop Recorder 

## 2022-06-11 NOTE — Progress Notes (Signed)
Tiffany [name] D.Tiffany [name] Phone: [phone]   Assessment and Plan:     1. Nontraumatic complete tear of left rotator cuff 2. Chronic left shoulder pain -Chronic with exacerbation, subsequent visit - Overall worsening of pain in left shoulder and into upper arm over the past 2 months - Reviewed MRI showing multiple rotator cuff tears in supraspinatus, infraspinatus, subscapularis - Patient has failed conservative therapy including course of Celebrex, subacromial CSI, Flexeril, HEP.  At this time she is interested in discussing surgical options with orthopedic surgery.  She will call us back and let us know which orthopedic surgeon she would like to be referred to - Recommend using sling for relative rest and protection of shoulder to decrease overall pain until evaluated by orthopedic surgery - Start Tylenol 500 to 1000 mg tablets 2-3 times a day for day-to-day pain relief - Offered tramadol prescription to use for severe breakthrough pain, however this gives patient headache, so she declined prescription at today's visit.    Pertinent previous records reviewed include MRI left shoulder 05/28/2022   Follow Up: As needed   Subjective:   I, Tiffany [name], am serving as a Education administrator for Tiffany [name]: left arm pain    HPI:    03/20/22 Patient is a 72 year old female complaining of left arm pain. Patient states left upper arm  pain worsening in the past month, some at the deltoid insertion site worse to abduct the arm, but also left mid arm with swelling as well, states having more pain there that is not cervical radicular, she had a fall maybe a foosh, no meds for the pain , no numbness or tingling , decreased ROM left arm bothers her a lot she has a hard time sleeping due to the swelling and the pain     04/10/2022 Patient states that she is not good    06/12/2022 Patient states that  she is not good    Relevant Historical Information: DM type II, GERD, history of stroke, hypertension,    Additional pertinent review of systems negative.   Current Outpatient Medications:    Accu-Chek Softclix Lancets lancets, Use as directed twice per day E11.9, Disp: 200 each, Rfl: 11   amLODipine (NORVASC) 5 MG tablet, TAKE 1 TABLET BY MOUTH EVERY DAY, Disp: 90 tablet, Rfl: 0   aspirin EC 325 MG EC tablet, Take 1 tablet (325 mg total) by mouth daily., Disp: 30 tablet, Rfl: 0   Blood Glucose Monitoring Suppl (ONE TOUCH ULTRA 2) w/Device KIT, Apply 1 Device topically in the morning and at bedtime. E11.9, Disp: 1 kit, Rfl: 0   celecoxib (CELEBREX) 200 MG capsule, Take 200 mg by mouth 2 (two) times daily., Disp: , Rfl:    cetirizine (ZYRTEC) 5 MG tablet, Take 1 tablet (5 mg total) by mouth daily., Disp: 15 tablet, Rfl: 0   Cholecalciferol (VITAMIN D) 50 MCG (2000 UT) tablet, Take 2,000 Units by mouth daily., Disp: , Rfl:    cyanocobalamin (VITAMIN B12) 1000 MCG tablet, Take 1 tablet (1,000 mcg total) by mouth daily., Disp: 90 tablet, Rfl: 3   cyclobenzaprine (FLEXERIL) 10 MG tablet, Take 1 tablet (10 mg total) by mouth at bedtime., Disp: 20 tablet, Rfl: 0   cyclobenzaprine (FLEXERIL) 5 MG tablet, Take 1 tablet (5 mg total) by mouth 3 (three) times daily as needed for muscle spasms., Disp: 40 tablet, Rfl: 1   EPINEPHrine  0.3 mg/0.3 mL IJ SOAJ injection, Inject 0.3 mg into the muscle as needed for anaphylaxis., Disp: 2 each, Rfl: 1   ezetimibe (ZETIA) 10 MG tablet, TAKE 1 TABLET BY MOUTH EVERY DAY, Disp: 90 tablet, Rfl: 3   famotidine (PEPCID) 20 MG tablet, Take 1 tablet (20 mg total) by mouth 2 (two) times daily., Disp: 180 tablet, Rfl: 3   gabapentin (NEURONTIN) 600 MG tablet, TAKE 1 TABLET BY MOUTH THREE TIMES DAILY, Disp: 270 tablet, Rfl: 1   glucose blood (ONETOUCH ULTRA) test strip, Use as instructed four times per day E11.9, Disp: 400 each, Rfl: 3   insulin glargine (LANTUS SOLOSTAR)  100 UNIT/ML Solostar Pen, Inject 40 Units into the skin at bedtime., Disp: 100 mL, Rfl: 3   Insulin Pen Needle 32G X 4 MM MISC, Use to inject  lantus once daily (Patient taking differently: Use to inject  lantus once daily), Disp: 100 each, Rfl: 4   losartan (COZAAR) 100 MG tablet, Take 1 tablet by mouth once daily, Disp: 90 tablet, Rfl: 3   meloxicam (MOBIC) 7.5 MG tablet, Take 7.5 mg by mouth daily as needed., Disp: , Rfl:    pantoprazole (PROTONIX) 40 MG tablet, TAKE 1 TABLET BY MOUTH EVERY DAY, Disp: 90 tablet, Rfl: 0   pravastatin (PRAVACHOL) 40 MG tablet, TAKE 1 TABLET BY MOUTH DAILY AT 6 PM., Disp: 90 tablet, Rfl: 2   traMADol (ULTRAM) 50 MG tablet, Take 1 tablet (50 mg total) by mouth every 6 (six) hours as needed., Disp: 30 tablet, Rfl: 0   triamcinolone (NASACORT) 55 MCG/ACT AERO nasal inhaler, Place 2 sprays into the nose daily., Disp: 3 each, Rfl: 3   VENTOLIN HFA 108 (90 Base) MCG/ACT inhaler, INHALE 2 PUFFS BY MOUTH EVERY 6 HOURS AS NEEDED FOR WHEEZING FOR SHORTNESS OF BREATH, Disp: 18 g, Rfl: 11   zolpidem (AMBIEN) 10 MG tablet, TAKE 1 TABLET BY MOUTH AT BEDTIME AS NEEDED, Disp: 90 tablet, Rfl: 1   Objective:     Vitals:   06/12/22 1357  BP: 122/78  Pulse: 88  SpO2: 99%  Weight: 158 lb (71.7 kg)  Height: '5\' 2"'$  (1.575 m)      Body mass index is 28.9 kg/m.    Physical Exam:    Gen: Appears well, nad, nontoxic and pleasant Neuro:sensation intact, strength is 5/5 with df/pf/inv/ev, muscle tone wnl Skin: no suspicious lesion or defmority Psych: A&O, appropriate mood and affect   Left shoulder: no deformity, swelling or muscle wasting No scapular winging FF 100, abd 90, int 20, ext 70 TTP deltoid, humerus NTTP over the Fruit , clavicle, ac, coracoid, biceps groove,   trapezius, cervical spine Positive Neer, Hawking's, empty can, O'Brien Neg   subscap liftoff, speeds,  Neg ant drawer, sulcus sign, apprehension Negative Spurling's test bilat FROM of neck        Electronically signed by:  Tiffany [name] D.Tiffany [name] Sports [facility] 2:23 PM 06/12/22

## 2022-06-12 ENCOUNTER — Ambulatory Visit (INDEPENDENT_AMBULATORY_CARE_PROVIDER_SITE_OTHER): Payer: Medicare Other | Admitting: Sports Medicine

## 2022-06-12 VITALS — BP 122/78 | HR 88 | Ht 62.0 in | Wt 158.0 lb

## 2022-06-12 DIAGNOSIS — M25512 Pain in left shoulder: Secondary | ICD-10-CM

## 2022-06-12 DIAGNOSIS — G8929 Other chronic pain: Secondary | ICD-10-CM | POA: Diagnosis not present

## 2022-06-12 DIAGNOSIS — M75122 Complete rotator cuff tear or rupture of left shoulder, not specified as traumatic: Secondary | ICD-10-CM | POA: Diagnosis not present

## 2022-06-12 NOTE — Patient Instructions (Addendum)
Good to see you   - Start Tylenol 500 to 1000 mg tablets 2-3 times a day for day-to-day pain relief  Please call our clinic back and let us know which orthopedic surgeon you would like to be referred to.  Our recommendation would be Dr. Alphonzo Severance, however we can refer to any orthopedic surgeon you prefer.

## 2022-06-17 ENCOUNTER — Telehealth: Payer: Self-pay | Admitting: Sports Medicine

## 2022-06-17 ENCOUNTER — Other Ambulatory Visit: Payer: Self-pay | Admitting: Sports Medicine

## 2022-06-17 DIAGNOSIS — G8929 Other chronic pain: Secondary | ICD-10-CM

## 2022-06-17 DIAGNOSIS — M7582 Other shoulder lesions, left shoulder: Secondary | ICD-10-CM

## 2022-06-17 DIAGNOSIS — M75122 Complete rotator cuff tear or rupture of left shoulder, not specified as traumatic: Secondary | ICD-10-CM

## 2022-06-17 DIAGNOSIS — M79602 Pain in left arm: Secondary | ICD-10-CM

## 2022-06-17 DIAGNOSIS — M25512 Pain in left shoulder: Secondary | ICD-10-CM

## 2022-06-17 NOTE — Telephone Encounter (Signed)
FYI, pt would also like to see Dr. Rolena Infante for her back but it does not have to be at the same visit. The shoulder is her priority.

## 2022-06-17 NOTE — Telephone Encounter (Signed)
Patient called asking if a referral could be sent to Dr Rolena Infante at [facility]? She said that she was told to let Dr Glennon Mac know where to send her referral.

## 2022-06-17 NOTE — Telephone Encounter (Signed)
Referral placed for Dr. Rolena Infante at [facility]

## 2022-06-26 ENCOUNTER — Ambulatory Visit: Payer: Self-pay

## 2022-06-26 NOTE — Patient Outreach (Signed)
  Care Coordination   Follow Up Visit Note   06/26/2022 Name: Tiffany [name] MRN: [id number] DOB: 1950-08-10  Tiffany [name] is a 72 y.o. year old female who sees Biagio Borg, MD for primary care. I spoke with  [name] by phone today.  What matters to the patients health and wellness today?  Ms. Gonsalves reports she is following up with providers as scheduled. She reports she has requested a referral to [facility] ortho surgeon for shoulder surgery. She states she is going to call to schedule an appointment when she gets off the phone. Ms. Mantione denies any other care coordination, disease management or resource needs at this time. And states she will call RNCM if care coordination needs in the future.   Goals Addressed             This Visit's Progress    COMPLETED: Care Coordination-improvement in pain       Care Coordination Interventions:  Discussed patient self perception of condition-Ms. Cleland reports she has followed up with Sports medicine provider and she has tear in her right shoulder. She reports pending shoulder surgery. RNCM reinforced that sports medicine provider sent a referral to her preferred provider. Ms. Sabet to contact them to schedule an appointment. Reviewed medications. Encouraged to take medications for pain as recommended by provider  Reviewed upcoming scheduled appointments Provided RNCM contact number and encouraged to call RNCM if care coordination needs in the future       SDOH assessments and interventions completed:  No  Care Coordination Interventions:  Yes, provided   Follow up plan: No further intervention required.   Encounter Outcome:  Pt. Visit Completed   Thea Silversmith, RN, MSN, BSN, CCM Care Coordinator [phone]

## 2022-06-26 NOTE — Patient Instructions (Addendum)
Visit Information  Thank you for taking time to visit with me today. Please don't hesitate to contact me if I can be of assistance to you.   Following are the goals we discussed today:   Goals Addressed             This Visit's Progress    COMPLETED: Care Coordination-improvement in pain       Care Coordination Interventions:  Discussed patient self perception of condition-Ms. Stthomas reports she has followed up with Sports medicine provider and she has tear in her right shoulder. She reports pending shoulder surgery. RNCM reinforced that sports medicine provider sent a referral to her preferred provider. Ms. Goodnow to contact them to schedule an appointment. Reviewed medications. Encouraged to take medications for pain as recommended by provider  Reviewed upcoming scheduled appointments Provided RNCM contact number and encouraged to call RNCM if care coordination needs in the future       If you are experiencing a Mental Health or [place] or need someone to talk to, please call the Suicide and Crisis Lifeline: [place], RN, MSN, BSN, [place] Management Coordinator [phone]

## 2022-07-01 ENCOUNTER — Other Ambulatory Visit: Payer: Self-pay | Admitting: Sports Medicine

## 2022-07-01 ENCOUNTER — Telehealth: Payer: Self-pay | Admitting: Sports Medicine

## 2022-07-01 DIAGNOSIS — M25512 Pain in left shoulder: Secondary | ICD-10-CM

## 2022-07-01 DIAGNOSIS — M79602 Pain in left arm: Secondary | ICD-10-CM

## 2022-07-01 DIAGNOSIS — G8929 Other chronic pain: Secondary | ICD-10-CM

## 2022-07-01 DIAGNOSIS — M75122 Complete rotator cuff tear or rupture of left shoulder, not specified as traumatic: Secondary | ICD-10-CM

## 2022-07-01 DIAGNOSIS — M7582 Other shoulder lesions, left shoulder: Secondary | ICD-10-CM

## 2022-07-01 NOTE — Telephone Encounter (Signed)
Patient called asking if a referral could sent to Dr Alphonzo Severance at [facility] for her left shoulder?  Please advise.

## 2022-07-01 NOTE — Telephone Encounter (Signed)
Referral to Tiffany [name] was placed

## 2022-07-07 LAB — CUP PACEART REMOTE DEVICE CHECK
Date Time Interrogation Session: 20240209231134
Implantable Pulse Generator Implant Date: 20220802

## 2022-07-08 ENCOUNTER — Ambulatory Visit: Payer: Medicare Other

## 2022-07-08 DIAGNOSIS — I639 Cerebral infarction, unspecified: Secondary | ICD-10-CM

## 2022-07-08 NOTE — Progress Notes (Signed)
Carelink Summary Report / Loop Recorder 

## 2022-07-15 ENCOUNTER — Ambulatory Visit: Payer: Self-pay

## 2022-07-15 ENCOUNTER — Encounter: Payer: Self-pay | Admitting: Orthopedic Surgery

## 2022-07-15 ENCOUNTER — Ambulatory Visit (INDEPENDENT_AMBULATORY_CARE_PROVIDER_SITE_OTHER): Payer: Medicare Other | Admitting: Orthopedic Surgery

## 2022-07-15 ENCOUNTER — Ambulatory Visit (INDEPENDENT_AMBULATORY_CARE_PROVIDER_SITE_OTHER): Payer: Medicare Other

## 2022-07-15 DIAGNOSIS — M79602 Pain in left arm: Secondary | ICD-10-CM | POA: Diagnosis not present

## 2022-07-15 DIAGNOSIS — M19012 Primary osteoarthritis, left shoulder: Secondary | ICD-10-CM

## 2022-07-15 NOTE — Progress Notes (Unsigned)
Office Visit Note   Patient: Tiffany [name]           Date of Birth: 24-May-1951           MRN: [id number] Visit Date: 07/15/2022 Requested by: Glennon Mac, [place],  [place] [id number] PCP: Biagio Borg, MD  Subjective: Chief Complaint  Patient presents with   Left Shoulder - Pain    HPI: Tiffany [name] is a 72 y.o. female who presents to the office reporting left shoulder and arm pain.  The pain radiates up to the neck.  Also radiates to the hand.  She reports decreased strength.  Denies any numbness and tingling.  Hard for her to sleep on that left-hand side.  She has had an MRI scan of the left shoulder which shows high-grade articular sided tearing of the distal supraspinatus tendon with full-thickness perforation at the footprint.  This scan is reviewed with the patient.  Mild glenohumeral joint and AC joint arthritis.  She has had a subacromial cortisone shot which gave her mild relief.  Symptoms overall getting worse.  This does keep her from sleeping..                ROS: All systems reviewed are negative as they relate to the chief complaint within the history of present illness.  Patient denies fevers or chills.  Assessment & Plan: Visit Diagnoses:  1. Left arm pain     Plan: Impression is left arm and shoulder pain.  She does have some degenerative arthritis in her neck.  AC joint is fairly exquisitely tender today.  MRI scan of the shoulder does show some rotator cuff pathology but is not overwhelming and is not really proportional to the amount of pain she is having.  I think it is possible this could be coming from her neck.  She is very sensitive to pain and I think based on that sensitivity getting through shoulder surgery would be exceedingly difficult.  For now would favor [facility] joint injection for diagnostic and therapeutic purposes along with MRI cervical spine.  Follow-up after that study so we can make decisions about further  treatment.  Follow-Up Instructions: No follow-ups on file.   Orders:  Orders Placed This Encounter  Procedures   XR Cervical Spine 2 or 3 views   US Guided Needle Placement - No Linked Charges   MR Cervical Spine w/o contrast   No orders of the defined types were placed in this encounter.     Procedures: Medium Joint Inj: L acromioclavicular on 07/15/2022 9:32 PM Indications: diagnostic evaluation and pain Details: 25 G 1.5 in needle, ultrasound-guided superior approach Medications: 3 mL lidocaine 1 %; 0.66 mL bupivacaine 0.25 %; 13.33 mg methylPREDNISolone acetate 40 MG/ML Outcome: tolerated well, no immediate complications Procedure, treatment alternatives, risks and benefits explained, specific risks discussed. Consent was given by the patient. Immediately prior to procedure a time out was called to verify the correct patient, procedure, equipment, support staff and site/side marked as required. Patient was prepped and draped in the usual sterile fashion.       Clinical Data: No additional findings.  Objective: Vital Signs: There were no vitals taken for this visit.  Physical Exam:  Constitutional: Patient appears well-developed HEENT:  Head: Normocephalic Eyes:EOM are normal Neck: Normal range of motion Cardiovascular: Normal rate Pulmonary/chest: Effort normal Neurologic: Patient is alert Skin: Skin is warm Psychiatric: Patient has normal mood and affect  Ortho Exam: Ortho exam  demonstrates general sensitivity to palpation and manipulation of the left shoulder.  5 out of 5 grip EPL FPL interosseous resection extension bicep triceps and deltoid strength.  No real restriction of passive external rotation of 15 degrees of abduction.  Has a lot of tenderness to palpation in the left AC joint.  Cervical spine range of motion is intact.  Reflexes symmetric 1 to 2+ out of 4 bilateral biceps and triceps.  No discrete paresthesias C5-T1.  Specialty Comments:  No specialty  comments available.  Imaging: No results found.   PMFS History: Patient Active Problem List   Diagnosis Date Noted   Right lumbar radiculopathy 03/19/2022   Pain in left upper arm 03/19/2022   Abdominal pain, generalized 01/10/2022   Porokeratosis 11/19/2021   Pain due to onychomycosis of toenails of both feet 11/19/2021   Acute ischemic left MCA stroke ([place]) 03/29/2021   Epigastric pain 03/11/2021   Ischemic stroke ([place]) 01/17/2021   Intracranial vascular stenosis 01/17/2021   Primary hypertension 01/17/2021   Type 2 diabetes mellitus with hyperglycemia, with long-term current use of insulin ([place]) 01/17/2021   History of completed stroke 10/31/2020   Stroke ([place]) 10/06/2020   Lipoma 08/26/2020   Asthma 08/26/2020   Bilateral leg pain 08/25/2020   Encounter for well adult exam with abnormal findings 05/06/2020   Allergic rhinitis 05/06/2020   Statin myopathy 05/02/2020   Left shoulder pain 11/01/2019   Chronic low back pain 05/03/2019   B12 deficiency 05/03/2019   Left leg swelling 11/16/2018   Left knee pain 11/16/2018   Low back pain 05/14/2018   Abnormal SPEP 11/18/2017   Sternoclavicular joint pain, left 08/19/2017   Cervical radiculopathy at C8 05/15/2017   Pre-ulcerative corn or callous 03/27/2017   Vitamin D deficiency 03/27/2017   Elevated total protein 03/27/2017   Bilateral shoulder pain 03/27/2017   Medicare annual wellness visit, subsequent 03/06/2016   Spondylolisthesis at L4-L5 level 10/06/2015   Acute frontal sinusitis 07/14/2015   Sleep disturbance 07/14/2015   Knee pain, left 11/10/2014   Sprain of ankle 04/11/2014   GERD (gastroesophageal reflux disease) 03/07/2014   Prolapse of vaginal wall with midline cystocele, grade 1 08/23/2013   Vaginal dryness 05/14/2012   Dry mouth 05/13/2012   Depression and insomnia 07/19/2010   Multilevel degenerative disc disease of cervical and lumbar spine  01/16/2008   Essential hypertension 01/01/2008   Diabetes  ([place]) 03/18/2007   Hyperlipidemia 02/13/2007   SLEEP APNEA 02/13/2007   Past Medical History:  Diagnosis Date   Allergy    Degenerative joint disease    Diabetes mellitus    GERD (gastroesophageal reflux disease)    Headache    Hyperlipidemia    Hypertension    Stroke ([place])     Family History  Problem Relation Age of Onset   Hypertension Mother    Diabetes Mother    Thyroid disease Mother    Heart disease Mother    Kidney disease Mother    Diabetes Sister    Hypertension Brother    Stroke Brother    Heart disease Brother    Colon cancer Neg Hx    Colon polyps Neg Hx    Rectal cancer Neg Hx    Stomach cancer Neg Hx     Past Surgical History:  Procedure Laterality Date   ABDOMINAL HYSTERECTOMY  1976   BACK SURGERY     KNEE ARTHROSCOPY     left x 2   LUMBAR LAMINECTOMY  1989   Social History  Occupational History   Occupation: Disability    Comment: retired day Runner, broadcasting/film/video  Tobacco Use   Smoking status: Former    Packs/day: 0.50    Years: 15.00    Total pack years: 7.50    Types: Cigarettes    Quit date: 08/02/1990    Years since quitting: 31.9   Smokeless tobacco: Never  Vaping Use   Vaping Use: Never used  Substance and Sexual Activity   Alcohol use: No    Alcohol/week: 0.0 standard drinks of alcohol   Drug use: No   Sexual activity: Never

## 2022-07-17 MED ORDER — METHYLPREDNISOLONE ACETATE 40 MG/ML IJ SUSP
13.3300 mg | INTRAMUSCULAR | Status: AC | PRN
  Administered 2022-07-15: 13.33 mg via INTRA_ARTICULAR

## 2022-07-17 MED ORDER — BUPIVACAINE HCL 0.25 % IJ SOLN
0.6600 mL | INTRAMUSCULAR | Status: AC | PRN
  Administered 2022-07-15: .66 mL via INTRA_ARTICULAR

## 2022-07-17 MED ORDER — LIDOCAINE HCL 1 % IJ SOLN
3.0000 mL | INTRAMUSCULAR | Status: AC | PRN
  Administered 2022-07-15: 3 mL

## 2022-07-20 ENCOUNTER — Other Ambulatory Visit: Payer: Self-pay | Admitting: Internal Medicine

## 2022-07-20 NOTE — Telephone Encounter (Signed)
Please refill as per office routine med refill policy (all routine meds to be refilled for 3 mo or monthly (per pt preference) up to one year from last visit, then month to month grace period for 3 mo, then further med refills will have to be denied) ? ?

## 2022-07-22 ENCOUNTER — Other Ambulatory Visit: Payer: Self-pay | Admitting: Surgical

## 2022-07-22 ENCOUNTER — Telehealth: Payer: Self-pay | Admitting: Orthopedic Surgery

## 2022-07-22 MED ORDER — TRAMADOL HCL 50 MG PO TABS: 50.0000 mg | ORAL_TABLET | Freq: Two times a day (BID) | ORAL | 0 refills | Status: DC | PRN

## 2022-07-22 NOTE — Telephone Encounter (Signed)
Pt called requesting pain medication. Please send to [facility]. Pt phone number is [phone].

## 2022-07-22 NOTE — Telephone Encounter (Signed)
Sent in tramadol

## 2022-07-23 NOTE — Telephone Encounter (Signed)
Tried calling to advise submitted. No answer.  

## 2022-07-26 ENCOUNTER — Telehealth: Payer: Self-pay | Admitting: Orthopedic Surgery

## 2022-07-26 ENCOUNTER — Other Ambulatory Visit: Payer: Self-pay | Admitting: Surgical

## 2022-07-26 MED ORDER — ACETAMINOPHEN-CODEINE 300-30 MG PO TABS: 1.0000 | ORAL_TABLET | Freq: Two times a day (BID) | ORAL | 0 refills | Status: DC | PRN

## 2022-07-26 NOTE — Telephone Encounter (Signed)
Sent in Tylenol #3 to [address] [facility] [address]

## 2022-07-26 NOTE — Telephone Encounter (Signed)
Patient called advised  she has a reaction to Tramadol. Patient said the medication makes her sick and she throws up. Patient said Tramadol gives her a bad headache. Patient asked if there is something else that can be called into the pharmacy. The number to contact patient is [phone]

## 2022-07-26 NOTE — Telephone Encounter (Signed)
Called and notified patient.

## 2022-07-29 ENCOUNTER — Telehealth: Payer: Self-pay | Admitting: Orthopedic Surgery

## 2022-07-29 ENCOUNTER — Other Ambulatory Visit: Payer: Self-pay | Admitting: Surgical

## 2022-07-29 ENCOUNTER — Ambulatory Visit
Admission: RE | Admit: 2022-07-29 | Discharge: 2022-07-29 | Disposition: A | Discharge status: Home / Self Care | Payer: Medicare Other | Source: Ambulatory Visit | Attending: Orthopedic Surgery | Admitting: Orthopedic Surgery

## 2022-07-29 DIAGNOSIS — M79602 Pain in left arm: Secondary | ICD-10-CM

## 2022-07-29 DIAGNOSIS — M4802 Spinal stenosis, cervical region: Secondary | ICD-10-CM | POA: Diagnosis not present

## 2022-07-29 MED ORDER — ACETAMINOPHEN-CODEINE 300-30 MG PO TABS: 1.0000 | ORAL_TABLET | Freq: Two times a day (BID) | ORAL | 0 refills | Status: DC | PRN

## 2022-07-29 NOTE — Telephone Encounter (Signed)
Sent in RX

## 2022-07-29 NOTE — Telephone Encounter (Signed)
Please see message from patient and advise.  ?

## 2022-07-29 NOTE — Telephone Encounter (Signed)
Patient advising her pharmacy doesn't have her pain medication she is requesting to have on sent to [facility], [facility] has the medication in stock. Please advise

## 2022-07-30 ENCOUNTER — Other Ambulatory Visit: Payer: Self-pay | Admitting: Surgical

## 2022-07-30 ENCOUNTER — Telehealth: Payer: Self-pay | Admitting: Orthopedic Surgery

## 2022-07-30 NOTE — Telephone Encounter (Signed)
I sent this in yesterday. Called to let patient know

## 2022-07-30 NOTE — Telephone Encounter (Signed)
I left voicemail advising. ?

## 2022-07-30 NOTE — Telephone Encounter (Signed)
Pt called stating [facility] is out of stock of tylenol 3 ans pt is asking for medication to be sent to [facility] on [address]. Please call pt when set she states she is in pain and unable to sleep. Pt phone number [phone].

## 2022-07-31 ENCOUNTER — Other Ambulatory Visit: Payer: Self-pay | Admitting: Surgical

## 2022-07-31 ENCOUNTER — Telehealth: Payer: Self-pay

## 2022-07-31 MED ORDER — HYDROCODONE-ACETAMINOPHEN 5-325 MG PO TABS: 1.0000 | ORAL_TABLET | Freq: Every day | ORAL | 0 refills | Status: DC | PRN

## 2022-07-31 NOTE — Telephone Encounter (Signed)
Sent in [place] once daily prn instead

## 2022-07-31 NOTE — Telephone Encounter (Signed)
[facility] requested alternative to T#[facility] for patient due to T#3 being on back order.

## 2022-07-31 NOTE — Progress Notes (Signed)
Please have her see MIC more within the next 2 weeks.

## 2022-08-01 ENCOUNTER — Other Ambulatory Visit: Payer: Self-pay

## 2022-08-01 DIAGNOSIS — M79602 Pain in left arm: Secondary | ICD-10-CM

## 2022-08-05 ENCOUNTER — Ambulatory Visit: Payer: Medicare Other | Admitting: Orthopedic Surgery

## 2022-08-09 ENCOUNTER — Ambulatory Visit (INDEPENDENT_AMBULATORY_CARE_PROVIDER_SITE_OTHER): Payer: Medicare Other

## 2022-08-09 VITALS — Ht 62.0 in | Wt 158.0 lb

## 2022-08-09 DIAGNOSIS — Z0000 Encounter for general adult medical examination without abnormal findings: Secondary | ICD-10-CM | POA: Diagnosis not present

## 2022-08-09 NOTE — Patient Instructions (Addendum)
[name] , Thank you for taking time to come for your Medicare Wellness Visit. I appreciate your ongoing commitment to your health goals. Please review the following plan we discussed and let me know if I can assist you in the future.   These are the goals we discussed:  Goals      Increase the amount of physical     Join a sports center do water aerobics, walk more and get in the whirlpool     Patient Stated     I want to continue to exercise and monitor my diet for diabetes.         This is a list of the screening recommended for you and due dates:  Health Maintenance  Topic Date Due   Zoster (Shingles) Vaccine (1 of 2) Never done   Eye exam for diabetics  05/01/2018   COVID-19 Vaccine (4 - 2023-24 season) 01/25/2022   Hemoglobin A1C  04/12/2022   Mammogram  05/02/2022   Colon Cancer Screening  07/22/2022   Flu Shot  08/25/2022*   Yearly kidney health urinalysis for diabetes  09/15/2022   Complete foot exam   09/15/2022   Yearly kidney function blood test for diabetes  01/11/2023   Lipid (cholesterol) test  01/11/2023   Medicare Annual Wellness Visit  08/09/2023   DTaP/Tdap/Td vaccine (3 - Td or Tdap) 05/02/2029   Pneumonia Vaccine  Completed   DEXA scan (bone density measurement)  Completed   Hepatitis C Screening: USPSTF Recommendation to screen - Ages 55-79 yo.  Completed   HPV Vaccine  Aged Out  *Topic was postponed. The date shown is not the original due date.    Advanced directives: Forms are available if you choose in the future to pursue completion.  This is recommended in order to make sure that your health wishes are honored in the event that you are unable to verbalize them to the provider.    Conditions/risks identified: Aim for 30 minutes of exercise or brisk walking, 6-8 glasses of water, and 5 servings of fruits and vegetables each day.   Next appointment: Follow up in one year for your annual wellness visit    Preventive Care 65 Years and Older,  Female Preventive care refers to lifestyle choices and visits with your health care provider that can promote health and wellness. What does preventive care include? A yearly physical exam. This is also called an annual well check. Dental exams once or twice a year. Routine eye exams. Ask your health care provider how often you should have your eyes checked. Personal lifestyle choices, including: Daily care of your teeth and gums. Regular physical activity. Eating a healthy diet. Avoiding tobacco and drug use. Limiting alcohol use. Practicing safe sex. Taking low-dose aspirin every day. Taking vitamin and mineral supplements as recommended by your health care provider. What happens during an annual well check? The services and screenings done by your health care provider during your annual well check will depend on your age, overall health, lifestyle risk factors, and family history of disease. Counseling  Your health care provider may ask you questions about your: Alcohol use. Tobacco use. Drug use. Emotional well-being. Home and relationship well-being. Sexual activity. Eating habits. History of falls. Memory and ability to understand (cognition). Work and work Statistician. Reproductive health. Screening  You may have the following tests or measurements: Height, weight, and BMI. Blood pressure. Lipid and cholesterol levels. These may be checked every 5 years, or more frequently if you  are over 71 years old. Skin check. Lung cancer screening. You may have this screening every year starting at age 2 if you have a 30-pack-year history of smoking and currently smoke or have quit within the past 15 years. Fecal occult blood test (FOBT) of the stool. You may have this test every year starting at age 27. Flexible sigmoidoscopy or colonoscopy. You may have a sigmoidoscopy every 5 years or a colonoscopy every 10 years starting at age 98. Hepatitis C blood test. Hepatitis B blood  test. Sexually transmitted disease (STD) testing. Diabetes screening. This is done by checking your blood sugar (glucose) after you have not eaten for a while (fasting). You may have this done every 1-3 years. Bone density scan. This is done to screen for osteoporosis. You may have this done starting at age 63. Mammogram. This may be done every 1-2 years. Talk to your health care provider about how often you should have regular mammograms. Talk with your health care provider about your test results, treatment options, and if necessary, the need for more tests. Vaccines  Your health care provider may recommend certain vaccines, such as: Influenza vaccine. This is recommended every year. Tetanus, diphtheria, and acellular pertussis (Tdap, Td) vaccine. You may need a Td booster every 10 years. Zoster vaccine. You may need this after age 67. Pneumococcal 13-valent conjugate (PCV13) vaccine. One dose is recommended after age 26. Pneumococcal polysaccharide (PPSV23) vaccine. One dose is recommended after age 60. Talk to your health care provider about which screenings and vaccines you need and how often you need them. This information is not intended to replace advice given to you by your health care provider. Make sure you discuss any questions you have with your health care provider. Document Released: 06/09/2015 Document Revised: 01/31/2016 Document Reviewed: 03/14/2015 Elsevier Interactive Patient Education  2017 [employer] Prevention in the Home Falls can cause injuries. They can happen to people of all ages. There are many things you can do to make your home safe and to help prevent falls. What can I do on the outside of my home? Regularly fix the edges of walkways and driveways and fix any cracks. Remove anything that might make you trip as you walk through a door, such as a raised step or threshold. Trim any bushes or trees on the path to your home. Use bright outdoor  lighting. Clear any walking paths of anything that might make someone trip, such as rocks or tools. Regularly check to see if handrails are loose or broken. Make sure that both sides of any steps have handrails. Any raised decks and porches should have guardrails on the edges. Have any leaves, snow, or ice cleared regularly. Use sand or salt on walking paths during winter. Clean up any spills in your garage right away. This includes oil or grease spills. What can I do in the bathroom? Use night lights. Install grab bars by the toilet and in the tub and shower. Do not use towel bars as grab bars. Use non-skid mats or decals in the tub or shower. If you need to sit down in the shower, use a plastic, non-slip stool. Keep the floor dry. Clean up any water that spills on the floor as soon as it happens. Remove soap buildup in the tub or shower regularly. Attach bath mats securely with double-sided non-slip rug tape. Do not have throw rugs and other things on the floor that can make you trip. What can I do in  the bedroom? Use night lights. Make sure that you have a light by your bed that is easy to reach. Do not use any sheets or blankets that are too big for your bed. They should not hang down onto the floor. Have a firm chair that has side arms. You can use this for support while you get dressed. Do not have throw rugs and other things on the floor that can make you trip. What can I do in the kitchen? Clean up any spills right away. Avoid walking on wet floors. Keep items that you use a lot in easy-to-reach places. If you need to reach something above you, use a strong step stool that has a grab bar. Keep electrical cords out of the way. Do not use floor polish or wax that makes floors slippery. If you must use wax, use non-skid floor wax. Do not have throw rugs and other things on the floor that can make you trip. What can I do with my stairs? Do not leave any items on the stairs. Make  sure that there are handrails on both sides of the stairs and use them. Fix handrails that are broken or loose. Make sure that handrails are as long as the stairways. Check any carpeting to make sure that it is firmly attached to the stairs. Fix any carpet that is loose or worn. Avoid having throw rugs at the top or bottom of the stairs. If you do have throw rugs, attach them to the floor with carpet tape. Make sure that you have a light switch at the top of the stairs and the bottom of the stairs. If you do not have them, ask someone to add them for you. What else can I do to help prevent falls? Wear shoes that: Do not have high heels. Have rubber bottoms. Are comfortable and fit you well. Are closed at the toe. Do not wear sandals. If you use a stepladder: Make sure that it is fully opened. Do not climb a closed stepladder. Make sure that both sides of the stepladder are locked into place. Ask someone to hold it for you, if possible. Clearly mark and make sure that you can see: Any grab bars or handrails. First and last steps. Where the edge of each step is. Use tools that help you move around (mobility aids) if they are needed. These include: Canes. Walkers. Scooters. Crutches. Turn on the lights when you go into a dark area. Replace any light bulbs as soon as they burn out. Set up your furniture so you have a clear path. Avoid moving your furniture around. If any of your floors are uneven, fix them. If there are any pets around you, be aware of where they are. Review your medicines with your doctor. Some medicines can make you feel dizzy. This can increase your chance of falling. Ask your doctor what other things that you can do to help prevent falls. This information is not intended to replace advice given to you by your health care provider. Make sure you discuss any questions you have with your health care provider. Document Released: 03/09/2009 Document Revised: 10/19/2015  Document Reviewed: 06/17/2014 Elsevier Interactive Patient Education  2017 [employer].

## 2022-08-09 NOTE — Progress Notes (Signed)
Subjective:   Tiffany [name] is a 72 y.o. female who presents for Medicare Annual (Subsequent) preventive examination.  Review of Systems     Cardiac Risk Factors include: advanced age (>78men, >63 women);diabetes mellitus;dyslipidemia;hypertension;sedentary lifestyle     Objective:    Today's Vitals   08/09/22 1351  Weight: 158 lb (71.7 kg)  Height: 5\' 2"  (1.575 m)   Body mass index is 28.9 kg/m.     08/09/2022    3:50 PM 08/03/2021    1:54 PM 10/24/2020   12:38 PM 10/18/2020    2:48 PM 10/06/2020    4:00 PM 10/06/2020   12:47 PM 07/20/2020    4:18 PM  Advanced Directives  Does Patient Have a Medical Advance Directive? No No No No No No No  Would patient like information on creating a medical advance directive? No - Patient declined No - Patient declined No - Patient declined No - Patient declined No - Patient declined No - Patient declined No - Patient declined    Current Medications (verified) Outpatient Encounter Medications as of 08/09/2022  Medication Sig   Accu-Chek Softclix Lancets lancets Use as directed twice per day E11.9   amLODipine (NORVASC) 5 MG tablet TAKE 1 TABLET BY MOUTH EVERY DAY   aspirin EC 325 MG EC tablet Take 1 tablet (325 mg total) by mouth daily.   Blood Glucose Monitoring Suppl (ONE TOUCH ULTRA 2) w/Device KIT Apply 1 Device topically in the morning and at bedtime. E11.9   celecoxib (CELEBREX) 200 MG capsule Take 200 mg by mouth 2 (two) times daily.   cetirizine (ZYRTEC) 5 MG tablet Take 1 tablet (5 mg total) by mouth daily.   Cholecalciferol (VITAMIN D) 50 MCG (2000 UT) tablet Take 2,000 Units by mouth daily.   cyanocobalamin (VITAMIN B12) 1000 MCG tablet Take 1 tablet (1,000 mcg total) by mouth daily.   cyclobenzaprine (FLEXERIL) 10 MG tablet Take 1 tablet (10 mg total) by mouth at bedtime.   EPINEPHrine 0.3 mg/0.3 mL IJ SOAJ injection Inject 0.3 mg into the muscle as needed for anaphylaxis.   ezetimibe (ZETIA) 10 MG tablet TAKE 1 TABLET BY  MOUTH EVERY DAY   famotidine (PEPCID) 20 MG tablet Take 1 tablet (20 mg total) by mouth 2 (two) times daily.   gabapentin (NEURONTIN) 600 MG tablet TAKE 1 TABLET BY MOUTH THREE TIMES DAILY   glucose blood (ONETOUCH ULTRA) test strip Use as instructed four times per day E11.9   HYDROcodone-acetaminophen (NORCO/VICODIN) 5-325 MG tablet Take 1 tablet by mouth daily as needed for moderate pain.   insulin glargine (LANTUS SOLOSTAR) 100 UNIT/ML Solostar Pen Inject 40 Units into the skin at bedtime.   Insulin Pen Needle 32G X 4 MM MISC Use to inject  lantus once daily (Patient taking differently: Use to inject  lantus once daily)   losartan (COZAAR) 100 MG tablet Take 1 tablet by mouth once daily   meloxicam (MOBIC) 7.5 MG tablet Take 7.5 mg by mouth daily as needed.   pantoprazole (PROTONIX) 40 MG tablet TAKE 1 TABLET BY MOUTH EVERY DAY   pravastatin (PRAVACHOL) 40 MG tablet TAKE 1 TABLET BY MOUTH DAILY AT 6 PM.   triamcinolone (NASACORT) 55 MCG/ACT AERO nasal inhaler Place 2 sprays into the nose daily.   VENTOLIN HFA 108 (90 Base) MCG/ACT inhaler INHALE 2 PUFFS BY MOUTH EVERY 6 HOURS AS NEEDED FOR WHEEZING FOR SHORTNESS OF BREATH   zolpidem (AMBIEN) 10 MG tablet TAKE 1 TABLET BY MOUTH AT BEDTIME AS  NEEDED   [DISCONTINUED] cyclobenzaprine (FLEXERIL) 5 MG tablet Take 1 tablet (5 mg total) by mouth 3 (three) times daily as needed for muscle spasms.   No facility-administered encounter medications on file as of 08/09/2022.    Allergies (verified) Contrast media [iodinated contrast media], Aspirin, Gadolinium derivatives, Metformin and related, Shellfish allergy, Tramadol, Penicillins, and Statins   History: Past Medical History:  Diagnosis Date   Allergy    Degenerative joint disease    Diabetes mellitus    GERD (gastroesophageal reflux disease)    Headache    Hyperlipidemia    Hypertension    Stroke [facility])    Past Surgical History:  Procedure Laterality Date   ABDOMINAL HYSTERECTOMY  1976    BACK SURGERY     KNEE ARTHROSCOPY     left x 2   LUMBAR LAMINECTOMY  1989   Family History  Problem Relation Age of Onset   Hypertension Mother    Diabetes Mother    Thyroid disease Mother    Heart disease Mother    Kidney disease Mother    Diabetes Sister    Hypertension Brother    Stroke Brother    Heart disease Brother    Colon cancer Neg Hx    Colon polyps Neg Hx    Rectal cancer Neg Hx    Stomach cancer Neg Hx    Social History   Socioeconomic History   Marital status: Single    Spouse name: Not on file   Number of children: 2   Years of education: 8   Highest education level: 8th grade  Occupational History   Occupation: Disability    Comment: retired day Runner, broadcasting/film/video  Tobacco Use   Smoking status: Former    Packs/day: 0.50    Years: 15.00    Additional pack years: 0.00    Total pack years: 7.50    Types: Cigarettes    Quit date: 08/02/1990    Years since quitting: 32.0   Smokeless tobacco: Never  Vaping Use   Vaping Use: Never used  Substance and Sexual Activity   Alcohol use: No    Alcohol/week: 0.0 standard drinks of alcohol   Drug use: No   Sexual activity: Never  Other Topics Concern   Not on file  Social History Narrative   Born in [place], [place].   Completed the 8th grade   Fun: Walk, crochet, puzzles   Diet - Variety of foods, does not eat breakfast   2 children - 44 and 45    6 grandchildren - 7 great grandchildren.    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   Right handed    Caffeine: 1/2 cup of coffee, rarely    Social Determinants of Radio broadcast assistant Strain: Low Risk  (08/09/2022)   Overall Financial Resource Strain (CARDIA)    Difficulty of Paying Living Expenses: Not hard at all  Food Insecurity: No Food Insecurity (08/09/2022)   Hunger Vital Sign    Worried About Running Out of Food in the Last Year: Never true    [place] in the Last Year: Never true  Transportation Needs: No Transportation Needs  (08/09/2022)   PRAPARE - Hydrologist (Medical): No    Lack of Transportation (Non-Medical): No  Physical Activity: Insufficiently Active (08/09/2022)   Exercise Vital Sign    Days of Exercise per Week: 3 days    Minutes of Exercise per Session: 30 min  Stress: No Stress Concern Present (08/09/2022)   [employer] [place]    Feeling of Stress : Not at all  Social Connections: Socially Isolated (08/09/2022)   Social Connection and Isolation Panel [NHANES]    Frequency of Communication with Friends and Family: Twice a week    Frequency of Social Gatherings with Friends and Family: Twice a week    Attends Religious Services: Never    Printmaker: No    Attends Music therapist: Never    Marital Status: Divorced    Tobacco Counseling Counseling given: Not Answered   Clinical Intake:  Pre-visit preparation completed: Yes  Pain : No/denies pain  Diabetes: Yes CBG done?: No Did pt. bring in CBG monitor from home?: No  How often do you need to have someone help you when you read instructions, pamphlets, or other written materials from your doctor or pharmacy?: 3 - Sometimes What is the last grade level you completed in school?: 8th  Diabetic?Yes   Nutrition Risk Assessment:  Has the patient had any N/V/D within the last 2 months?  No  Does the patient have any non-healing wounds?  No  Has the patient had any unintentional weight loss or weight gain?  No   Diabetes:  Is the patient diabetic?  Yes  If diabetic, was a CBG obtained today?  No  Did the patient bring in their glucometer from home?  No  How often do you monitor your CBG's? daily.   Financial Strains and Diabetes Management:  Are you having any financial strains with the device, your supplies or your medication? No .  Does the patient want to be seen by Chronic Care Management for management  of their diabetes?  No  Would the patient like to be referred to a Nutritionist or for Diabetic Management?  No   Diabetic Exams:  Diabetic Eye Exam: Completed records requested  Diabetic Foot Exam: Completed 09/14/21   Interpreter Needed?: No  Information entered by :: Denman George LPN   Activities of Daily Living    08/09/2022    3:51 PM  In your present state of health, do you have any difficulty performing the following activities:  Hearing? 0  Vision? 0  Difficulty concentrating or making decisions? 0  Walking or climbing stairs? 0  Dressing or bathing? 0  Doing errands, shopping? 0  Preparing Food and eating ? N  Using the Toilet? N  In the past six months, have you accidently leaked urine? N  Do you have problems with loss of bowel control? N  Managing your Medications? N  Managing your Finances? N  Housekeeping or managing your Housekeeping? N    Patient Care Team: Biagio Borg, MD as PCP - General (Internal Medicine) Croitoru, Dani Gobble, MD as Consulting Physician (Cardiology) Frann Rider, NP as Nurse Practitioner (Neurology) [facility] Meredith Pel, MD as Consulting Physician (Orthopedic Surgery)  Indicate any recent Medical Services you may have received from other than [facility] providers in the past year (date may be approximate).     Assessment:   This is a routine wellness examination for [place].  Hearing/Vision screen Hearing Screening - Comments:: Denies hearing difficulties  Vision Screening - Comments:: Wears rx glasses - up to date with routine eye exams with Dr. Katy Fitch   Dietary issues and exercise activities discussed: Current Exercise Habits: Home exercise routine, Type of exercise: walking, Time (Minutes): 30, Frequency (Times/Week): 3,  Weekly Exercise (Minutes/Week): 90, Intensity: Mild   Goals Addressed             This Visit's Progress    COMPLETED: <enter goal here>       COMPLETED: Blood Pressure < 140/90        COMPLETED: Client understands the importance of follow-up with providers by attending scheduled visits       Continue to be independent, physically and socially active.  I would like to lose around 20 pounds by continue to walk in my neighborhood.     COMPLETED: HEMOGLOBIN A1C < 7       COMPLETED: LDL CALC < 100        Depression Screen    08/09/2022    3:49 PM 03/19/2022    2:32 PM 01/10/2022    3:57 PM 09/14/2021    3:21 PM 08/03/2021    1:58 PM 08/03/2021    1:52 PM 11/01/2020    9:05 PM  PHQ 2/9 Scores  PHQ - 2 Score 0 0 0 0 0 0 0  PHQ- 9 Score  0         Fall Risk    08/09/2022    1:52 PM 03/19/2022    2:32 PM 03/19/2022    2:18 PM 01/10/2022    3:57 PM 09/14/2021    3:19 PM  [place] in the past year? 0 1 1 1  0  Number falls in past yr: 0 1 0 0 0  Injury with Fall? 0 1 1 0 0  Risk for fall due to : Impaired mobility   No Fall Risks   Follow up Falls prevention discussed;Education provided;Falls evaluation completed   Falls evaluation completed     FALL RISK PREVENTION PERTAINING TO THE HOME:  Any stairs in or around the home? No  If so, are there any without handrails? No  Home free of loose throw rugs in walkways, pet beds, electrical cords, etc? Yes  Adequate lighting in your home to reduce risk of falls? Yes   ASSISTIVE DEVICES UTILIZED TO PREVENT FALLS:  Life alert? No  Use of a cane, walker or w/c? No  Grab bars in the bathroom? Yes  Shower chair or bench in shower? No  Elevated toilet seat or a handicapped toilet? Yes   TIMED UP AND GO:  Was the test performed? No . Telephonic visit   Cognitive Function:    03/10/2017    3:28 PM 02/07/2015    3:21 PM  MMSE - Mini Mental State Exam  Not completed:  Refused  Orientation to time 5   Orientation to Place 5   Registration 3   Attention/ Calculation 5   Recall 2   Language- name 2 objects 2   Language- repeat 1   Language- follow 3 step command 3   Language- read & follow direction 1    Write a sentence 1   Copy design 1   Total score 29         08/09/2022    3:51 PM  6CIT Screen  What Year? 0 points  What month? 0 points  What time? 0 points  Count back from 20 0 points  Months in reverse 0 points  Repeat phrase 0 points  Total Score 0 points    Immunizations Immunization History  Administered Date(s) Administered   Fluad Quad(high Dose 65+) 03/03/2019, 05/02/2020   Influenza Split 06/08/2012   Influenza Whole 05/25/2007, 03/17/2008, 03/27/2009   Influenza,  High Dose Seasonal PF 03/10/2017, 03/15/2019   Influenza,inj,Quad PF,6+ Mos 02/07/2015, 03/06/2016   PFIZER(Purple Top)SARS-COV-2 Vaccination 08/09/2019, 08/31/2019, 05/23/2020   Pneumococcal Conjugate-13 05/14/2018, 03/03/2019   Pneumococcal Polysaccharide-23 06/27/2004, 03/06/2016, 09/14/2021   Td 09/26/2008   Tdap 05/03/2019   Zoster, Live 02/14/2014    TDAP status: Up to date  Flu Vaccine status: Declined, Education has been provided regarding the importance of this vaccine but patient still declined. Advised may receive this vaccine at local pharmacy or Health Dept. Aware to provide a copy of the vaccination record if obtained from local pharmacy or Health Dept. Verbalized acceptance and understanding.  Pneumococcal vaccine status: Up to date  Covid-19 vaccine status: Information provided on how to obtain vaccines.   Qualifies for Shingles Vaccine? Yes   Zostavax completed Yes   Shingrix Completed?: No.    Education has been provided regarding the importance of this vaccine. Patient has been advised to call insurance company to determine out of pocket expense if they have not yet received this vaccine. Advised may also receive vaccine at local pharmacy or Health Dept. Verbalized acceptance and understanding.  Screening Tests Health Maintenance  Topic Date Due   Zoster Vaccines- Shingrix (1 of 2) Never done   OPHTHALMOLOGY EXAM  05/01/2018   COVID-19 Vaccine (4 - 2023-24 season) 01/25/2022    HEMOGLOBIN A1C  04/12/2022   MAMMOGRAM  05/02/2022   COLONOSCOPY (Pts 45-86yrs Insurance coverage will need to be confirmed)  07/22/2022   INFLUENZA VACCINE  08/25/2022 (Originally 12/25/2021)   Diabetic kidney evaluation - Urine ACR  09/15/2022   FOOT EXAM  09/15/2022   Diabetic kidney evaluation - eGFR measurement  01/11/2023   LIPID PANEL  01/11/2023   Medicare Annual Wellness (AWV)  08/09/2023   DTaP/Tdap/Td (3 - Td or Tdap) 05/02/2029   Pneumonia Vaccine 59+ Years old  Completed   DEXA SCAN  Completed   Hepatitis C Screening  Completed   HPV VACCINES  Aged Out    Health Maintenance  Health Maintenance Due  Topic Date Due   Zoster Vaccines- Shingrix (1 of 2) Never done   OPHTHALMOLOGY EXAM  05/01/2018   COVID-19 Vaccine (4 - 2023-24 season) 01/25/2022   HEMOGLOBIN A1C  04/12/2022   MAMMOGRAM  05/02/2022   COLONOSCOPY (Pts 45-107yrs Insurance coverage will need to be confirmed)  07/22/2022    Colorectal cancer screening: Type of screening: Colonoscopy. Completed 07/22/12. Repeat every 10 years  Mammogram status: Ordered 04/23/22. Pt provided with contact info and advised to call to schedule appt.   Bone Density status: Completed 04/03/17. Results reflect: Bone density results: OSTEOPENIA. Repeat every 2 years.  Lung Cancer Screening: (Low Dose CT Chest recommended if Age 70-80 years, 30 pack-year currently smoking OR have quit w/in 15years.) does not qualify.   Lung Cancer Screening Referral: n/a  Additional Screening:  Hepatitis C Screening: does qualify; Completed 03/10/21  Vision Screening: Recommended annual ophthalmology exams for early detection of glaucoma and other disorders of the eye. Is the patient up to date with their annual eye exam?  Yes  Who is the provider or what is the name of the office in which the patient attends annual eye exams? [facility]  If pt is not established with a provider, would they like to be referred to a provider to establish care? No .    Dental Screening: Recommended annual dental exams for proper oral hygiene  Community Resource Referral / Chronic Care Management: CRR required this visit?  No   CCM required  this visit?  No      Plan:     I have personally reviewed and noted the following in the patient's chart:   Medical and social history Use of alcohol, tobacco or illicit drugs  Current medications and supplements including opioid prescriptions. Patient is currently taking opioid prescriptions. Information provided to patient regarding non-opioid alternatives. Patient advised to discuss non-opioid treatment plan with their provider. Functional ability and status Nutritional status Physical activity Advanced directives List of other physicians Hospitalizations, surgeries, and ER visits in previous 12 months Vitals Screenings to include cognitive, depression, and falls Referrals and appointments  In addition, I have reviewed and discussed with patient certain preventive protocols, quality metrics, and best practice recommendations. A written personalized care plan for preventive services as well as general preventive health recommendations were provided to patient.     Vanetta Mulders, [place]   [id number]   Due to this being a virtual visit, the after visit summary with patients personalized plan was offered to patient via mail or my-chart. Patient would like to access on my-chart  Nurse Notes: No concerns

## 2022-08-12 ENCOUNTER — Ambulatory Visit (INDEPENDENT_AMBULATORY_CARE_PROVIDER_SITE_OTHER): Payer: Medicare Other

## 2022-08-12 DIAGNOSIS — I639 Cerebral infarction, unspecified: Secondary | ICD-10-CM

## 2022-08-13 LAB — CUP PACEART REMOTE DEVICE CHECK
Date Time Interrogation Session: 20240317232445
Implantable Pulse Generator Implant Date: 20220802

## 2022-08-14 ENCOUNTER — Encounter: Payer: Self-pay | Admitting: Orthopedic Surgery

## 2022-08-14 ENCOUNTER — Ambulatory Visit (INDEPENDENT_AMBULATORY_CARE_PROVIDER_SITE_OTHER): Payer: Medicare Other | Admitting: Orthopedic Surgery

## 2022-08-14 ENCOUNTER — Ambulatory Visit (INDEPENDENT_AMBULATORY_CARE_PROVIDER_SITE_OTHER): Payer: Medicare Other

## 2022-08-14 VITALS — BP 161/87 | HR 73 | Ht 62.0 in | Wt 158.0 lb

## 2022-08-14 DIAGNOSIS — M79602 Pain in left arm: Secondary | ICD-10-CM

## 2022-08-14 DIAGNOSIS — M5412 Radiculopathy, cervical region: Secondary | ICD-10-CM | POA: Diagnosis not present

## 2022-08-14 DIAGNOSIS — M542 Cervicalgia: Secondary | ICD-10-CM

## 2022-08-14 NOTE — Progress Notes (Signed)
Orthopedic Spine Surgery Office Note  Assessment: Patient is a 72 y.o. female with neck pain that radiates into the left upper extremity.  Most of her imaging shows right-sided foraminal stenosis, however she does have foraminal stenosis at C4/5 and central stenosis at C3/4 and C4/5.   Plan: -Explained that initially conservative treatment is tried as a significant number of patients may experience relief with these treatment modalities. Discussed that the conservative treatments include:  -activity modification  -physical therapy  -over the counter pain medications  -medrol dosepak  -cervical steroid injections -Patient has tried Tylenol, NSAIDs, left shoulder injection -Recommended cervical steroid injection for diagnostic and therapeutic purposes.  I explained to her that not all of her symptoms are coming from her neck.  She does have significant pain with testing on physical exam the left shoulder, but that would not explain the pain that goes into her forearm.  She does not have symptoms of myelopathy or signs on exam so I think starting with an injection is reasonable to determine what kind of relief she may get with a cervical decompression -Patient should return to office in 6 weeks, x-rays at next visit: none   Patient expressed understanding of the plan and all questions were answered to the patient's satisfaction.   ___________________________________________________________________________   History:  Patient is a 72 y.o. female who presents today for cervical spine.  Patient has had about 6 months of neck pain that radiates into her left upper extremity.  Pain has been getting progressively worse with time.  There is no trauma or injury that preceded the pain.  Pain is felt in the lateral aspect of the left shoulder going into the lateral arm and the dorsal forearm.  It does not radiate in the hand.  No right-sided symptoms.  Had an intra-articular injection to the left shoulder  because she does have rotator cuff tear of both the supraspinatus and infraspinatus.  She states she got minimal relief with that injection.  Has never gotten a cervical spine injection.   Weakness: Yes, left hand has gotten weaker recently relative to her right hand.  No other weakness noted Difficulty with fine motor skills (e.g., buttoning shirts, handwriting): Yes, with the left hand due to the weakness.  No other clumsiness with hands.  Does not have difficulty with buttoning shirts or texting. Symptoms of imbalance: Denies Paresthesias and numbness: Denies Bowel or bladder incontinence: Denies Saddle anesthesia: Denies  Treatments tried: Tylenol, NSAIDs, left shoulder injection  Review of systems: Denies fevers and chills, night sweats, unexplained weight loss, history of cancer, pain that wakes them at night  Past medical history: Hyperlipidemia Hypertension Ulcerative colitis GERD History of stroke Diabetes (last A1c was 6.9 on 01/10/2022) Neuropathy Chronic pain  Allergies: Contrast, aspirin, gadolinium, metformin, tramadol, penicillin, statins  Past surgical history:  Hysterectomy Left knee arthroscopy x 2 Lumbar laminectomy  Social history: Denies use of nicotine product (smoking, vaping, patches, smokeless) Alcohol use: denies Denies recreational drug use  Physical Exam:  General: no acute distress, appears stated age Neurologic: alert, answering questions appropriately, following commands Respiratory: unlabored breathing on room air, symmetric chest rise Psychiatric: appropriate affect, normal cadence to speech   MSK (spine):  -Strength exam      Left  Right Grip strength               4+/5  5/5 Interosseus   5/5   5/5 Wrist extension  5/5  5/5 Wrist flexion   5/5  5/5 Elbow  flexion   4+/5  5/5 Deltoid    4+/5  5/5  EHL    4/5  4/5 TA    5/5  5/5 GSC    5/5  5/5 Knee extension  5/5  5/5 Hip flexion   5/5  5/5  Her proximal left upper  extremity strength exam limited by pain   -Sensory exam    Sensation intact to light touch in L3-S1 nerve distributions of bilateral lower extremities  Sensation intact to light touch in C5-T1 nerve distributions of bilateral upper extremities  -Brachioradialis DTR: 1/4 on the left, 1/4 on the right -Biceps DTR: 1/4 on the left, 1/4 on the right -Achilles DTR: 1/4 on the left, 1/4 on the right -Patellar tendon DTR: 1/4 on the left, 1/4 on the right  -Spurling: Negative bilaterally -Hoffman sign: Negative bilaterally -Clonus: No beats bilaterally -Interosseous wasting: None seen -Grip and release test: Negative -Romberg: Negative -Gait: Normal -Imbalance with tandem gait: No  Left shoulder exam: Pain through range of motion especially with external rotation, positive Hawkins, pain with Jobe test, tender to palpation over anterior shoulder, negative belly press, no weakness with external rotation with arm at side Right shoulder exam: Tender to palpation over the anterior shoulder, no pain through range of motion, negative Jobe test, negative belly press, no weakness with external rotation with arm at side  Tinel's at wrist: Negative bilaterally Phalen's at wrist: Negative bilaterally Durkan's: Negative bilaterally  Tinel's at elbow: Negative bilaterally  Imaging: XR of the cervical spine from 07/15/2022 and 08/14/2022 was independently reviewed and interpreted, showing disc height loss and anterior osteophyte formation at C5/6 and C6/7.  No evidence of instability on flexion/extension views.  No fracture or dislocation seen.  Lordotic alignment.  MRI of the cervical spine from 07/29/2022 was independently reviewed and interpreted, showing right sided paracentral disc herniation at C3-4 with central stenosis.  Bilateral foraminal stenosis at C4/5.  Central stenosis at that level as well.  Right-sided foraminal stenosis at C5/6.   Patient name: Tiffany [name] Patient MRN:  [id number] Date of visit: 08/14/22

## 2022-08-20 ENCOUNTER — Other Ambulatory Visit: Payer: Self-pay | Admitting: Internal Medicine

## 2022-08-21 NOTE — Progress Notes (Signed)
Carelink Summary Report / Loop Recorder 

## 2022-08-26 ENCOUNTER — Other Ambulatory Visit: Payer: Self-pay | Admitting: Sports Medicine

## 2022-08-26 ENCOUNTER — Telehealth: Payer: Self-pay | Admitting: Physical Medicine and Rehabilitation

## 2022-08-26 NOTE — Telephone Encounter (Signed)
Patient called. She would like an appointment with Dr. [name].  

## 2022-08-27 NOTE — Telephone Encounter (Signed)
LVM to return call to schedule injection 

## 2022-08-30 ENCOUNTER — Encounter: Payer: Self-pay | Admitting: Gastroenterology

## 2022-09-03 ENCOUNTER — Telehealth: Payer: Self-pay

## 2022-09-03 ENCOUNTER — Other Ambulatory Visit: Payer: Self-pay | Admitting: Physical Medicine and Rehabilitation

## 2022-09-03 MED ORDER — ACETAMINOPHEN-CODEINE 300-30 MG PO TABS: 1.0000 | ORAL_TABLET | Freq: Three times a day (TID) | ORAL | 0 refills | Status: DC | PRN

## 2022-09-03 NOTE — Telephone Encounter (Signed)
Patient is scheduled for injection on 09/12/22. She is requesting something for pain until the injection. Pharmacy is [facility]

## 2022-09-12 ENCOUNTER — Ambulatory Visit: Payer: Medicare Other | Admitting: Physical Medicine and Rehabilitation

## 2022-09-12 ENCOUNTER — Other Ambulatory Visit: Payer: Self-pay

## 2022-09-12 VITALS — BP 121/74 | HR 76

## 2022-09-12 DIAGNOSIS — M5412 Radiculopathy, cervical region: Secondary | ICD-10-CM

## 2022-09-12 MED ORDER — BETAMETHASONE SOD PHOS & ACET 6 (3-3) MG/ML IJ SUSP: 12.0000 mg | Freq: Once | INTRAMUSCULAR | Status: DC

## 2022-09-12 MED ORDER — METHYLPREDNISOLONE ACETATE 80 MG/ML IJ SUSP: 80.0000 mg | Freq: Once | INTRAMUSCULAR | Status: DC

## 2022-09-12 NOTE — Progress Notes (Signed)
Functional Pain Scale - descriptive words and definitions  Distressing (6)    Pain is present/unable to complete most ADLs limited by pain/sleep is difficult and active distraction is only marginal. Moderate range order  Average Pain 9   +Driver, -BT, +Dye Allergies.  Neck pain on left side. Pain mainly in left arm from shoulder to wrist

## 2022-09-12 NOTE — Progress Notes (Signed)
Tiffany [name] - 72 y.o. female MRN [id number]  Date of birth: [birth year]  Office Visit Note: Visit Date: 09/12/2022 PCP: Corwin Levins, MD Referred by: London Sheer, MD  Subjective: Chief Complaint  Patient presents with   Neck - Pain   HPI:  Tiffany [name] is a 72 y.o. female who comes in today at the request of Dr. Willia Craze for planned Left C7-T1 Cervical Interlaminar epidural steroid injection with fluoroscopic guidance.  The patient has failed conservative care including home exercise, medications, time and activity modification.  This injection will be diagnostic and hopefully therapeutic.  Please see requesting physician notes for further details and justification.  [name]'s diet allergy to contrast and also to gadolinium and shellfish.  Shellfish allergies or not related to contrast allergies.  Even though she lists anaphylaxis she also is nausea and vomiting but prior epidural injection in [facility] imaging did use contrast without any difficulty.  We will use a very small amount of contrast.   ROS Otherwise per HPI.  Assessment & Plan: Visit Diagnoses:    ICD-10-CM   1. Cervical radiculopathy  M54.12 XR C-ARM NO REPORT    Epidural Steroid injection    betamethasone acetate-betamethasone sodium phosphate (CELESTONE) injection 12 mg    DISCONTINUED: methylPREDNISolone acetate (DEPO-MEDROL) injection 80 mg      Plan: No additional findings.   Meds & Orders:  Meds ordered this encounter  Medications   DISCONTD: methylPREDNISolone acetate (DEPO-MEDROL) injection 80 mg   betamethasone acetate-betamethasone sodium phosphate (CELESTONE) injection 12 mg    Orders Placed This Encounter  Procedures   XR C-ARM NO REPORT   Epidural Steroid injection    Follow-up: Return for visit to requesting provider as needed.   Procedures: No procedures performed      Clinical History: CLINICAL DATA:  Pain radiating into left arm   EXAM: MRI CERVICAL SPINE WITHOUT  CONTRAST   TECHNIQUE: Multiplanar, multisequence MR imaging of the cervical spine was performed. No intravenous contrast was administered.   COMPARISON:  None Available.   FINDINGS: Alignment: Straightening of the normal cervical lordosis.   Vertebrae: No fracture. No discitis. There is an osseous hemangioma at C6.   Cord: No evidence of cord signal abnormality. There is flattening of the spinal cord, particularly on the right at the C3-C4 and C4-C5 level.   Posterior Fossa, vertebral arteries, paraspinal tissues: Negative.   Disc levels:   C1-C2: Mild degenerative change.   C2-C3: Small central disc protrusion. Mild bilateral facet degenerative change. No spinal canal stenosis. No neural foraminal stenosis.   C3-C4: Right paracentral disc protrusion with slight asymmetric flattening of the spinal cord and severe overall spinal canal stenosis. No evidence of cord signal abnormality. Mild bilateral facet degenerative change. No significant neural foraminal narrowing.   C4-C5: Right paracentral disc protrusion with associated severe spinal canal stenosis and deformation of the ventral spinal cord. Moderate to severe bilateral neural foraminal narrowing   C5-C6: Minimal disc bulge. Moderate left and mild right facet degenerative change. Mild overall spinal canal narrowing. Severe right and moderate left neural foraminal narrowing.   C6-C7: Eccentric right disc bulge and uncovertebral hypertrophy. Severe right neural foraminal narrowing. Mild left neural foraminal narrowing. Moderate spinal canal narrowing.   C7-T1: Unremarkable.   IMPRESSION: 1. Severe spinal canal stenosis at C3-C4 and C4-C5 with deformation of the spinal cord. No evidence of cord signal abnormality. 2. Moderate to severe bilateral neural foraminal narrowing at C4-C5 (bilateral), C5-C6 (right), and C6-C7 (right).  Electronically Signed   By: Lorenza Cambridge M.D.   On: 07/30/2022 11:46      Objective:  VS:  HT:    WT:   BMI:     BP:121/74  HR:76bpm  TEMP: ( )  RESP:  Physical Exam Vitals and nursing note reviewed.  Constitutional:      General: She is not in acute distress.    Appearance: Normal appearance. She is not ill-appearing.  HENT:     Head: Normocephalic and atraumatic.     Right Ear: External ear normal.     Left Ear: External ear normal.  Eyes:     Extraocular Movements: Extraocular movements intact.  Cardiovascular:     Rate and Rhythm: Normal rate.     Pulses: Normal pulses.  Musculoskeletal:     Cervical back: Tenderness present. No rigidity.     Right lower leg: No edema.     Left lower leg: No edema.     Comments: Patient has good strength in the upper extremities including 5 out of 5 strength in wrist extension long finger flexion and APB.  There is no atrophy of the hands intrinsically.  There is a negative Hoffmann's test.   Lymphadenopathy:     Cervical: No cervical adenopathy.  Skin:    Findings: No erythema, lesion or rash.  Neurological:     General: No focal deficit present.     Mental Status: She is alert and oriented to person, place, and time.     Sensory: No sensory deficit.     Motor: No weakness or abnormal muscle tone.     Coordination: Coordination normal.  Psychiatric:        Mood and Affect: Mood normal.        Behavior: Behavior normal.      Imaging: No results found.

## 2022-09-12 NOTE — Patient Instructions (Signed)

## 2022-09-16 ENCOUNTER — Ambulatory Visit (INDEPENDENT_AMBULATORY_CARE_PROVIDER_SITE_OTHER): Payer: Medicare Other

## 2022-09-16 DIAGNOSIS — I639 Cerebral infarction, unspecified: Secondary | ICD-10-CM | POA: Diagnosis not present

## 2022-09-16 LAB — CUP PACEART REMOTE DEVICE CHECK
Date Time Interrogation Session: 20240419230459
Implantable Pulse Generator Implant Date: 20220802

## 2022-09-20 ENCOUNTER — Other Ambulatory Visit: Payer: Self-pay | Admitting: Physical Medicine and Rehabilitation

## 2022-09-20 NOTE — Progress Notes (Signed)
Carelink Summary Report / Loop Recorder 

## 2022-09-25 ENCOUNTER — Ambulatory Visit: Payer: Medicare Other | Admitting: Orthopedic Surgery

## 2022-09-25 DIAGNOSIS — M810 Age-related osteoporosis without current pathological fracture: Secondary | ICD-10-CM | POA: Diagnosis not present

## 2022-09-25 MED ORDER — ACETAMINOPHEN-CODEINE 300-30 MG PO TABS: 1.0000 | ORAL_TABLET | Freq: Four times a day (QID) | ORAL | 0 refills | Status: DC | PRN

## 2022-09-25 NOTE — Progress Notes (Signed)
Orthopedic Spine Surgery Office Note   Assessment: Patient is a 72 y.o. female with neck pain that radiates into the left upper extremity.  Most of her imaging shows right-sided foraminal stenosis, however she does have foraminal stenosis at C4/5 and central stenosis at C3/4 and C4/5.     Plan: -Patient has tried Tylenol, NSAIDs, left shoulder injection, cervical spine injection -Her exam again points to shoulder as being contributory to her pain which I went over again. I discussed that her shoulder pathology would not explain the symptoms radiating distal to the elbow. These are more radicular symptoms. I advised her to proceed with treatment for whichever is more painful. If her radiating down her arm is more painful than the pain when she moves her shoulder, then it may make sense to do a cervical surgery since she has failed to get improvement with conservative treatments. If the opposite is true, then it would make more sense to proceed with shoulder treatment first.  -Discussed C3-5 ACDF as a treatment option for her. Ordered a DEXA scan if she decides to go that route, would want to make sure she does not have osteoporosis -Prescribed tylenol #3 -Patient should return to office in 5-6 weeks, x-rays at next visit: none     Patient expressed understanding of the plan and all questions were answered to the patient's satisfaction.    ___________________________________________________________________________     History:   Patient is a 73 y.o. female who is returning to the office to discuss her cervical spine.  She has not had over 6 months of neck pain that radiates into her left upper extremity.  She feels it along the lateral aspect of her shoulder going into the lateral arm and the dorsal forearm.  She does have pain with overhead motion or rotation through the shoulder.  MRI of her shoulder did show rotator cuff tears of the supraspinatus and infraspinatus.  She got up steroid injection  to her shoulder and she said that did not help at all.  After our last visit, I recommended diagnostic and therapeutic injection to her cervical spine.  She got that injection and said she got 0 relief with that.  She did not even get relief for a day.  She feels her pain constantly and on a daily basis.  No new symptoms since last time I saw her.    Treatments tried: Tylenol, NSAIDs, left shoulder injection, cervical spine injection    Physical Exam:   General: no acute distress, appears stated age Neurologic: alert, answering questions appropriately, following commands Respiratory: unlabored breathing on room air, symmetric chest rise Psychiatric: appropriate affect, normal cadence to speech     MSK (spine):   -Strength exam                                                   Left                  Right Grip strength               4+/5                 5/5 Interosseus                  5/5  5/5 Wrist extension            5/5                  5/5 Wrist flexion                 5/5                  5/5 Elbow flexion                4+/5                5/5 Deltoid                          4+/5                5/5   EHL                              4/5                  4/5 TA                                 5/5                  5/5 GSC                             5/5                  5/5 Knee extension            5/5                  5/5 Hip flexion                    5/5                  5/5   Her proximal left upper extremity strength exam limited by pain    -Sensory exam                           Sensation intact to light touch in L3-S1 nerve distributions of bilateral lower extremities             Sensation intact to light touch in C5-T1 nerve distributions of bilateral upper extremities   -Brachioradialis DTR: 1/4 on the left, 1/4 on the right -Biceps DTR: 1/4 on the left, 1/4 on the right -Achilles DTR: 1/4 on the left, 1/4 on the right -Patellar tendon DTR: 1/4  on the left, 1/4 on the right   -Spurling: Negative bilaterally -Hoffman sign: Negative bilaterally -Clonus: No beats bilaterally -Interosseous wasting: None seen   Left shoulder exam: Pain with internal and external rotation. Pain with external rotation past 50 degrees. Pain with internal rotation past 70 degrees. Positive Hawkins, pain with Jobe test, tender to palpation over anterior shoulder, negative belly press, no weakness with external rotation with arm at side   Tinel's at wrist: Negative bilaterally Phalen's at wrist: Negative bilaterally Durkan's: Negative bilaterally   Tinel's at elbow: Negative bilaterally   Imaging: XR of the cervical spine from 07/15/2022 and 08/14/2022 was previously independently reviewed and  interpreted, showing disc height loss and anterior osteophyte formation at C5/6 and C6/7.  No evidence of instability on flexion/extension views.  No fracture or dislocation seen.  Lordotic alignment.   MRI of the cervical spine from 07/29/2022 was previously independently reviewed and interpreted, showing right sided paracentral disc herniation at C3-4 with central stenosis.  Bilateral foraminal stenosis at C4/5.  Central stenosis at that level as well.  Right-sided foraminal stenosis at C5/6.     Patient name: Tiffany [name] Patient MRN: [id number] Date of visit: 09/25/22

## 2022-10-09 ENCOUNTER — Telehealth: Payer: Self-pay | Admitting: Orthopedic Surgery

## 2022-10-09 MED ORDER — ACETAMINOPHEN-CODEINE 300-30 MG PO TABS: 1.0000 | ORAL_TABLET | Freq: Four times a day (QID) | ORAL | 0 refills | Status: AC | PRN

## 2022-10-09 NOTE — Telephone Encounter (Signed)
Added note pt asked for refill of tylenol 3. Please send to pharmacy on file. Pt phone number is  [phone].

## 2022-10-09 NOTE — Telephone Encounter (Signed)
Pt called stating that a referral for Bone Density test. Pt also states she wan to move forward with spinal fusion after bone density and not shoulder replacement. Please call pt about this matter at [phone].

## 2022-10-10 NOTE — Telephone Encounter (Signed)
Order faxed to [facility]. 

## 2022-10-10 NOTE — Telephone Encounter (Signed)
Can you please get her Bone Density referral to [facility]?

## 2022-10-17 ENCOUNTER — Ambulatory Visit (INDEPENDENT_AMBULATORY_CARE_PROVIDER_SITE_OTHER): Payer: Medicare Other | Admitting: Internal Medicine

## 2022-10-17 ENCOUNTER — Ambulatory Visit (INDEPENDENT_AMBULATORY_CARE_PROVIDER_SITE_OTHER): Payer: Medicare Other

## 2022-10-17 VITALS — BP 120/80 | HR 89 | Temp 98.3°F | Ht 62.0 in | Wt 154.0 lb

## 2022-10-17 DIAGNOSIS — R0602 Shortness of breath: Secondary | ICD-10-CM | POA: Diagnosis not present

## 2022-10-17 DIAGNOSIS — Z794 Long term (current) use of insulin: Secondary | ICD-10-CM

## 2022-10-17 DIAGNOSIS — E559 Vitamin D deficiency, unspecified: Secondary | ICD-10-CM | POA: Diagnosis not present

## 2022-10-17 DIAGNOSIS — E1165 Type 2 diabetes mellitus with hyperglycemia: Secondary | ICD-10-CM

## 2022-10-17 DIAGNOSIS — E782 Mixed hyperlipidemia: Secondary | ICD-10-CM

## 2022-10-17 DIAGNOSIS — I10 Essential (primary) hypertension: Secondary | ICD-10-CM | POA: Diagnosis not present

## 2022-10-17 DIAGNOSIS — E538 Deficiency of other specified B group vitamins: Secondary | ICD-10-CM

## 2022-10-17 DIAGNOSIS — Z0001 Encounter for general adult medical examination with abnormal findings: Secondary | ICD-10-CM | POA: Diagnosis not present

## 2022-10-17 DIAGNOSIS — R0600 Dyspnea, unspecified: Secondary | ICD-10-CM | POA: Diagnosis not present

## 2022-10-17 DIAGNOSIS — M545 Low back pain, unspecified: Secondary | ICD-10-CM | POA: Diagnosis not present

## 2022-10-17 DIAGNOSIS — G8929 Other chronic pain: Secondary | ICD-10-CM

## 2022-10-17 LAB — CUP PACEART REMOTE DEVICE CHECK
Date Time Interrogation Session: 20240522230622
Implantable Pulse Generator Implant Date: 20220802

## 2022-10-17 LAB — CBC WITH DIFFERENTIAL/PLATELET
Basophils Absolute: 0 10*3/uL (ref 0.0–0.1)
Basophils Relative: 0.4 % (ref 0.0–3.0)
Eosinophils Absolute: 0.1 10*3/uL (ref 0.0–0.7)
Eosinophils Relative: 1.5 % (ref 0.0–5.0)
HCT: 38.6 % (ref 36.0–46.0)
Hemoglobin: 12.4 g/dL (ref 12.0–15.0)
Lymphocytes Relative: 27.4 % (ref 12.0–46.0)
Lymphs Abs: 1.1 10*3/uL (ref 0.7–4.0)
MCHC: 32.2 g/dL (ref 30.0–36.0)
MCV: 88.4 fl (ref 78.0–100.0)
Monocytes Absolute: 0.3 10*3/uL (ref 0.1–1.0)
Monocytes Relative: 7.6 % (ref 3.0–12.0)
Neutro Abs: 2.4 10*3/uL (ref 1.4–7.7)
Neutrophils Relative %: 63.1 % (ref 43.0–77.0)
Platelets: 251 10*3/uL (ref 150.0–400.0)
RBC: 4.37 Mil/uL (ref 3.87–5.11)
RDW: 14.5 % (ref 11.5–15.5)
WBC: 3.9 10*3/uL — ABNORMAL LOW (ref 4.0–10.5)

## 2022-10-17 LAB — MICROALBUMIN / CREATININE URINE RATIO
Creatinine,U: 118.1 mg/dL
Microalb Creat Ratio: 0.9 mg/g (ref 0.0–30.0)
Microalb, Ur: 1.1 mg/dL (ref 0.0–1.9)

## 2022-10-17 LAB — BASIC METABOLIC PANEL
BUN: 13 mg/dL (ref 6–23)
CO2: 28 mEq/L (ref 19–32)
Calcium: 8.8 mg/dL (ref 8.4–10.5)
Chloride: 104 mEq/L (ref 96–112)
Creatinine, Ser: 0.79 mg/dL (ref 0.40–1.20)
GFR: 75.34 mL/min (ref >60.00)
Glucose, Bld: 120 mg/dL — ABNORMAL HIGH (ref 70–99)
Potassium: 3.6 mEq/L (ref 3.5–5.1)
Sodium: 139 mEq/L (ref 135–145)

## 2022-10-17 LAB — LIPID PANEL
Cholesterol: 144 mg/dL (ref 0–200)
HDL: 51.4 mg/dL (ref >39.00)
LDL Cholesterol: 84 mg/dL (ref 0–99)
NonHDL: 92.15
Total CHOL/HDL Ratio: 3
Triglycerides: 41 mg/dL (ref 0.0–149.0)
VLDL: 8.2 mg/dL (ref 0.0–40.0)

## 2022-10-17 LAB — VITAMIN D 25 HYDROXY (VIT D DEFICIENCY, FRACTURES): VITD: 50.6 ng/mL (ref 30.00–100.00)

## 2022-10-17 LAB — HEPATIC FUNCTION PANEL
ALT: 15 U/L (ref 0–35)
AST: 17 U/L (ref 0–37)
Albumin: 4.1 g/dL (ref 3.5–5.2)
Alkaline Phosphatase: 59 U/L (ref 39–117)
Bilirubin, Direct: 0.1 mg/dL (ref 0.0–0.3)
Total Bilirubin: 0.3 mg/dL (ref 0.2–1.2)
Total Protein: 7.7 g/dL (ref 6.0–8.3)

## 2022-10-17 LAB — URINALYSIS, ROUTINE W REFLEX MICROSCOPIC
Bilirubin Urine: NEGATIVE
Hgb urine dipstick: NEGATIVE
Ketones, ur: NEGATIVE
Nitrite: NEGATIVE
Specific Gravity, Urine: 1.02 (ref 1.000–1.030)
Total Protein, Urine: NEGATIVE
Urine Glucose: NEGATIVE
Urobilinogen, UA: 0.2 (ref 0.0–1.0)
pH: 5.5 (ref 5.0–8.0)

## 2022-10-17 LAB — VITAMIN B12: Vitamin B-12: >1500 pg/mL — ABNORMAL HIGH (ref 211–911)

## 2022-10-17 LAB — TSH: TSH: 1.91 u[IU]/mL (ref 0.35–5.50)

## 2022-10-17 LAB — HEMOGLOBIN A1C: Hgb A1c MFr Bld: 6.7 % — ABNORMAL HIGH (ref 4.6–6.5)

## 2022-10-17 MED ORDER — ZOLPIDEM TARTRATE 10 MG PO TABS: 10.0000 mg | ORAL_TABLET | Freq: Every evening | ORAL | 1 refills | Status: DC | PRN

## 2022-10-17 MED ORDER — GABAPENTIN (ONCE-DAILY) 750 MG PO TABS: 1.0000 | ORAL_TABLET | Freq: Three times a day (TID) | ORAL | 5 refills | Status: DC

## 2022-10-17 MED ORDER — CYCLOBENZAPRINE HCL 10 MG PO TABS: 10.0000 mg | ORAL_TABLET | Freq: Every day | ORAL | 5 refills | Status: DC

## 2022-10-17 MED ORDER — PRAVASTATIN SODIUM 80 MG PO TABS: 80.0000 mg | ORAL_TABLET | Freq: Every day | ORAL | 3 refills | Status: DC

## 2022-10-17 MED ORDER — GABAPENTIN 300 MG PO CAPS: 900.0000 mg | ORAL_CAPSULE | Freq: Three times a day (TID) | ORAL | 5 refills | Status: DC

## 2022-10-17 MED ORDER — EZETIMIBE 10 MG PO TABS: 10.0000 mg | ORAL_TABLET | Freq: Every day | ORAL | 3 refills | Status: DC

## 2022-10-17 NOTE — Progress Notes (Signed)
Patient ID: Tiffany [name], female   DOB: 12-Oct-1950, 72 y.o.   MRN: [id number]         Chief Complaint:: wellness exam and white spots to legs, bilateral leg pain low back pain, insomnia, hld, low b12       HPI:  Tiffany [name] is a 72 y.o. female here for wellness exam; for shingrix at pharmacy, pt will call for her own eye exam and mammogram, declines colonoscopy and covid booster, o/w up to date                        Also has been seeing ortho for right low back pain, left rotater cuff tear, but needs c3-5 anterior cervical discectomy and fusion per Dr Christell Constant, but also need DXA before.  Also has small white spots to skin of legs with depigmentation, wondering if anything significant.  Has worsening persistent bilateral LE neuritic pain.  Has ongoing issue with sleep, needs ambien refill.  Taking B12.     Wt Readings from Last 3 Encounters:  10/17/22 154 lb (69.9 kg)  08/14/22 158 lb (71.7 kg)  08/09/22 158 lb (71.7 kg)   BP Readings from Last 3 Encounters:  10/17/22 120/80  09/12/22 121/74  08/14/22 (!) 161/87   Immunization History  Administered Date(s) Administered   Fluad Quad(high Dose 65+) 03/03/2019, 05/02/2020   Influenza Split 06/08/2012   Influenza Whole 05/25/2007, 03/17/2008, 03/27/2009   Influenza, High Dose Seasonal PF 03/10/2017, 03/15/2019   Influenza,inj,Quad PF,6+ Mos 02/07/2015, 03/06/2016   PFIZER(Purple Top)SARS-COV-2 Vaccination 08/09/2019, 08/31/2019, 05/23/2020   Pneumococcal Conjugate-13 05/14/2018, 03/03/2019   Pneumococcal Polysaccharide-23 06/27/2004, 03/06/2016, 09/14/2021   Td 09/26/2008   Tdap 05/03/2019   Zoster, Live 02/14/2014   Health Maintenance Due  Topic Date Due   Zoster Vaccines- Shingrix (1 of 2) Never done   OPHTHALMOLOGY EXAM  05/01/2018   MAMMOGRAM  05/02/2022      Past Medical History:  Diagnosis Date   Allergy    Degenerative joint disease    Diabetes mellitus    GERD (gastroesophageal reflux disease)    Headache     Hyperlipidemia    Hypertension    Stroke [facility])    Past Surgical History:  Procedure Laterality Date   ABDOMINAL HYSTERECTOMY  1976   BACK SURGERY     KNEE ARTHROSCOPY     left x 2   LUMBAR LAMINECTOMY  1989    reports that she quit smoking about 32 years ago. Her smoking use included cigarettes. She has a 7.50 pack-year smoking history. She has never used smokeless tobacco. She reports that she does not drink alcohol and does not use drugs. family history includes Diabetes in her mother and sister; Heart disease in her brother and mother; Hypertension in her brother and mother; Kidney disease in her mother; Stroke in her brother; Thyroid disease in her mother. Allergies  Allergen Reactions   Contrast Media [Iodinated Contrast Media] Anaphylaxis, Nausea And Vomiting, Swelling and Other (See Comments)    Patients throat closes with nausea and vomiting    Aspirin Nausea And Vomiting and Other (See Comments)    Can take EC aspirin   Gadolinium Derivatives Nausea And Vomiting   Metformin And Related Nausea And Vomiting and Other (See Comments)    Severe nausea and vomiting   Shellfish Allergy Nausea And Vomiting   Tramadol Nausea And Vomiting and Other (See Comments)    Migraines   Penicillins Nausea And Vomiting  Statins Other (See Comments)    Myalgias    Current Outpatient Medications on File Prior to Visit  Medication Sig Dispense Refill   Accu-Chek Softclix Lancets lancets Use as directed twice per day E11.9 200 each 11   amLODipine (NORVASC) 5 MG tablet TAKE 1 TABLET BY MOUTH EVERY DAY 90 tablet 0   aspirin EC 325 MG EC tablet Take 1 tablet (325 mg total) by mouth daily. 30 tablet 0   Blood Glucose Monitoring Suppl (ONE TOUCH ULTRA 2) w/Device KIT Apply 1 Device topically in the morning and at bedtime. E11.9 1 kit 0   celecoxib (CELEBREX) 200 MG capsule Take 200 mg by mouth 2 (two) times daily.     cetirizine (ZYRTEC) 5 MG tablet Take 1 tablet (5 mg total) by mouth daily. 15  tablet 0   Cholecalciferol (VITAMIN D) 50 MCG (2000 UT) tablet Take 2,000 Units by mouth daily.     cyanocobalamin (VITAMIN B12) 1000 MCG tablet Take 1 tablet (1,000 mcg total) by mouth daily. 90 tablet 3   EPINEPHrine 0.3 mg/0.3 mL IJ SOAJ injection Inject 0.3 mg into the muscle as needed for anaphylaxis. 2 each 1   famotidine (PEPCID) 20 MG tablet Take 1 tablet (20 mg total) by mouth 2 (two) times daily. 180 tablet 3   glucose blood (ONETOUCH ULTRA) test strip Use as instructed four times per day E11.9 400 each 3   Insulin Pen Needle 32G X 4 MM MISC Use to inject  lantus once daily (Patient taking differently: Use to inject  lantus once daily) 100 each 4   losartan (COZAAR) 100 MG tablet Take 1 tablet by mouth once daily 90 tablet 3   meloxicam (MOBIC) 7.5 MG tablet Take 7.5 mg by mouth daily as needed.     pantoprazole (PROTONIX) 40 MG tablet TAKE 1 TABLET BY MOUTH EVERY DAY 90 tablet 0   triamcinolone (NASACORT) 55 MCG/ACT AERO nasal inhaler Place 2 sprays into the nose daily. 3 each 3   VENTOLIN HFA 108 (90 Base) MCG/ACT inhaler INHALE 2 PUFFS BY MOUTH EVERY 6 HOURS AS NEEDED FOR WHEEZING FOR SHORTNESS OF BREATH 18 g 11   Current Facility-Administered Medications on File Prior to Visit  Medication Dose Route Frequency Provider Last Rate Last Admin   betamethasone acetate-betamethasone sodium phosphate (CELESTONE) injection 12 mg  12 mg Other Once Tyrell Antonio, MD            ROS:  All others reviewed and negative.  Objective        PE:  BP 120/80 (BP Location: Left Arm, Patient Position: Sitting, Cuff Size: Normal)   Pulse 89   Temp 98.3 F (36.8 C) (Oral)   Ht 5\' 2"  (1.575 m)   Wt 154 lb (69.9 kg)   SpO2 97%   BMI 28.17 kg/m                 Constitutional: Pt appears in NAD               HENT: Head: NCAT.                Right Ear: External ear normal.                 Left Ear: External ear normal.                Eyes: . Pupils are equal, round, and reactive to light.  Conjunctivae and EOM are normal  Nose: without d/c or deformity               Neck: Neck supple. Gross normal ROM               Cardiovascular: Normal rate and regular rhythm.                 Pulmonary/Chest: Effort normal and breath sounds without rales or wheezing.                Abd:  Soft, NT, ND, + BS, no organomegaly               Neurological: Pt is alert. At baseline orientation, motor grossly intact               Skin: Skin is warm. No rashes, no other new lesions, LE edema - none               Psychiatric: Pt behavior is normal without agitation   Micro: none  Cardiac tracings I have personally interpreted today:  none  Pertinent Radiological findings (summarize): none   Lab Results  Component Value Date   WBC 3.9 (L) 10/17/2022   HGB 12.4 10/17/2022   HCT 38.6 10/17/2022   PLT 251.0 10/17/2022   GLUCOSE 120 (H) 10/17/2022   CHOL 144 10/17/2022   TRIG 41.0 10/17/2022   HDL 51.40 10/17/2022   LDLCALC 84 10/17/2022   ALT 15 10/17/2022   AST 17 10/17/2022   NA 139 10/17/2022   K 3.6 10/17/2022   CL 104 10/17/2022   CREATININE 0.79 10/17/2022   BUN 13 10/17/2022   CO2 28 10/17/2022   TSH 1.91 10/17/2022   INR 1.0 10/06/2020   HGBA1C 6.7 (H) 10/17/2022   MICROALBUR 1.1 10/17/2022   Assessment/Plan:  Tiffany [name] is a 72 y.o. Black or African American [2] female with  has a past medical history of Allergy, Degenerative joint disease, Diabetes mellitus, GERD (gastroesophageal reflux disease), Headache, Hyperlipidemia, Hypertension, and Stroke (HCC).  Encounter for well adult exam with abnormal findings Age and sex appropriate education and counseling updated with regular exercise and diet Referrals for preventative services - pt to call for eye exam and mammogram Immunizations addressed - for shingrix at pharmacy, declines covid booster Smoking counseling  - none needed Evidence for depression or other mood disorder - none significant Most recent  labs reviewed. I have personally reviewed and have noted: 1) the patient's medical and social history 2) The patient's current medications and supplements 3) The patient's height, weight, and BMI have been recorded in the chart   Diabetes [facility]) Lab Results  Component Value Date   HGBA1C 6.7 (H) 10/17/2022   Stable, pt to continue current medical treatment lantus 40 u qhs   Hyperlipidemia Lab Results  Component Value Date   LDLCALC 84 10/17/2022   Uncontrolled, for increased pravachol 80 qd,    Vitamin D deficiency Last vitamin D Lab Results  Component Value Date   VD25OH 50.60 10/17/2022   Stable, cont oral replacement   B12 deficiency Lab Results  Component Value Date   VITAMINB12 >1500 (H) 10/17/2022   Overcontrolled, ok to dc oral B12   Type 2 diabetes mellitus with hyperglycemia, with long-term current use of insulin (HCC) Lab Results  Component Value Date   HGBA1C 6.7 (H) 10/17/2022   Stable, pt to continue current medical treatment lantus 40 u qhs   Low back pain With uncontrolled pain to legs, for flexeril 10 tid  prn, and increased gabapentin 900 tid  Followup: Return in about 6 months (around 04/19/2023).  Oliver Barre, MD 10/19/2022 6:28 PM [facility]

## 2022-10-17 NOTE — Progress Notes (Signed)
The test results show that your current treatment is OK, as the tests are stable.  Please continue the same plan.  There is no other need for change of treatment or further evaluation based on these results, at this time.  thanks 

## 2022-10-17 NOTE — Patient Instructions (Addendum)
Please have your Shingrix (shingles) shots done at your local pharmacy.  Ok to increase the gabapentin to 900 mg three times per day  Please continue all other medications as before, and refills have been done if requested  - ambien 10 mg as needed  Ok to change the tizanidine back to the flexeril 10 mg  Ok to increase the pravachol to 80 mg per day  Ok to stop the B12 for now  Please have the pharmacy call with any other refills you may need.  Please continue your efforts at being more active, low cholesterol diet, and weight control.  You are otherwise up to date with prevention measures today.  Please keep your appointments with your specialists as you may have planned  Please go to the XRAY Department in the first floor for the x-ray testing  Please go to the LAB at the blood drawing area for the tests to be done  You will be contacted by phone if any changes need to be made immediately.  Otherwise, you will receive a letter about your results with an explanation, but please check with MyChart first.  Please remember to sign up for MyChart if you have not done so, as this will be important to you in the future with finding out test results, communicating by private email, and scheduling acute appointments online when needed.  Please make an Appointment to return in 6 months, or sooner if needed

## 2022-10-18 ENCOUNTER — Telehealth: Payer: Self-pay | Admitting: Internal Medicine

## 2022-10-18 ENCOUNTER — Other Ambulatory Visit: Payer: Self-pay

## 2022-10-18 MED ORDER — LANTUS SOLOSTAR 100 UNIT/ML ~~LOC~~ SOPN: 40.0000 [IU] | PEN_INJECTOR | Freq: Every day | SUBCUTANEOUS | 3 refills | Status: DC

## 2022-10-18 NOTE — Telephone Encounter (Signed)
Prescription Request  10/18/2022  LOV: 10/17/2022  What is the name of the medication or equipment?  insulin glargine (LANTUS SOLOSTAR) 100 UNIT/ML Solostar Pen   Have you contacted your pharmacy to request a refill? No   Which pharmacy would you like this sent to?   [facility] [address] [place], [place] - [address] Phone: [phone]  Fax: [phone]   Patient notified that their request is being sent to the clinical staff for review and that they should receive a response within 2 business days.   Please advise at Mobile [phone] (mobile)

## 2022-10-18 NOTE — Telephone Encounter (Signed)
Prescription Request  10/18/2022  LOV: 10/17/2022  What is the name of the medication or equipment?  cyclobenzaprine (FLEXERIL) 10 MG tablet  zolpidem (AMBIEN) 10 MG tablet  gabapentin (NEURONTIN) 300 MG capsule [   Have you contacted your pharmacy to request a refill? Yes   Which pharmacy would you like this sent to?       [facility] [address] [place], [place] - [address] [place] [address] Phone: [phone] Fax: [phone] Patient notified that their request is being sent to the clinical staff for review and that they should receive a response within 2 business days.   Please advise at Mobile [phone] (mobile)

## 2022-10-18 NOTE — Telephone Encounter (Signed)
Refill Sent. 

## 2022-10-18 NOTE — Telephone Encounter (Signed)
Medication was sent to the wrong Pharmacy.

## 2022-10-19 ENCOUNTER — Encounter: Payer: Self-pay | Admitting: Internal Medicine

## 2022-10-19 NOTE — Assessment & Plan Note (Signed)
With uncontrolled pain to legs, for flexeril 10 tid prn, and increased gabapentin 900 tid

## 2022-10-19 NOTE — Assessment & Plan Note (Signed)
Lab Results  Component Value Date   HGBA1C 6.7 (H) 10/17/2022   Stable, pt to continue current medical treatment lantus 40 u qhs  

## 2022-10-19 NOTE — Assessment & Plan Note (Signed)
Lab Results  Component Value Date   HGBA1C 6.7 (H) 10/17/2022   Stable, pt to continue current medical treatment lantus 40 u qhs

## 2022-10-19 NOTE — Assessment & Plan Note (Signed)
Age and sex appropriate education and counseling updated with regular exercise and diet Referrals for preventative services - pt to call for eye exam and mammogram Immunizations addressed - for shingrix at pharmacy, declines covid booster Smoking counseling  - none needed Evidence for depression or other mood disorder - none significant Most recent labs reviewed. I have personally reviewed and have noted: 1) the patient's medical and social history 2) The patient's current medications and supplements 3) The patient's height, weight, and BMI have been recorded in the chart

## 2022-10-19 NOTE — Assessment & Plan Note (Signed)
Last vitamin D Lab Results  Component Value Date   VD25OH 50.60 10/17/2022   Stable, cont oral replacement

## 2022-10-19 NOTE — Assessment & Plan Note (Signed)
Lab Results  Component Value Date   LDLCALC 84 10/17/2022   Uncontrolled, for increased pravachol 80 qd,

## 2022-10-19 NOTE — Assessment & Plan Note (Signed)
Lab Results  Component Value Date   VITAMINB12 >1500 (H) 10/17/2022   Overcontrolled, ok to dc oral B12

## 2022-10-22 ENCOUNTER — Ambulatory Visit (INDEPENDENT_AMBULATORY_CARE_PROVIDER_SITE_OTHER): Payer: Medicare Other

## 2022-10-22 ENCOUNTER — Other Ambulatory Visit: Payer: Self-pay | Admitting: Internal Medicine

## 2022-10-22 DIAGNOSIS — I639 Cerebral infarction, unspecified: Secondary | ICD-10-CM

## 2022-10-22 NOTE — Progress Notes (Signed)
Carelink Summary Report / Loop Recorder 

## 2022-10-23 ENCOUNTER — Other Ambulatory Visit: Payer: Self-pay

## 2022-10-24 DIAGNOSIS — E349 Endocrine disorder, unspecified: Secondary | ICD-10-CM | POA: Diagnosis not present

## 2022-10-24 DIAGNOSIS — M810 Age-related osteoporosis without current pathological fracture: Secondary | ICD-10-CM | POA: Diagnosis not present

## 2022-10-24 DIAGNOSIS — Z90710 Acquired absence of both cervix and uterus: Secondary | ICD-10-CM | POA: Diagnosis not present

## 2022-10-24 LAB — HM DEXA SCAN

## 2022-10-28 ENCOUNTER — Telehealth: Payer: Self-pay | Admitting: Orthopedic Surgery

## 2022-10-28 NOTE — Telephone Encounter (Signed)
Orthopedic Plan of Care  Patient's DEXA scan from 10/24/2022 was reviewed today.  It showed a T-score of -1.  This is not consistent with osteoporosis.  She would not require any preoperative bone density treatment prior to surgical intervention. She is supposed to see me in the office in the next couple of weeks. I will go over the results with her at that visit.    London Sheer, MD Orthopedic Surgeon

## 2022-10-29 ENCOUNTER — Telehealth: Payer: Self-pay | Admitting: Internal Medicine

## 2022-10-29 ENCOUNTER — Other Ambulatory Visit: Payer: Self-pay | Admitting: Orthopedic Surgery

## 2022-10-29 ENCOUNTER — Other Ambulatory Visit: Payer: Self-pay | Admitting: Internal Medicine

## 2022-10-29 NOTE — Telephone Encounter (Signed)
Patient's pharmacy said they have not received the prescription. She would like to know if it can be re-sent to [facility]. Best callback is [phone].

## 2022-10-29 NOTE — Telephone Encounter (Signed)
It has been resent

## 2022-10-29 NOTE — Telephone Encounter (Signed)
Called pt back she wanted to know what her numbers running on her labs. Went over her Bmet, Kidneys, cholesterol with pt. Also pt asked if MD sent in pain medicine for her. Inform her no only thing he sent in was the Flexeril and Gabapentin. Pt verbalize she understood.Marland Kitchenlmb

## 2022-10-29 NOTE — Telephone Encounter (Signed)
Patient called and said she would like a call back to go over her lab results from 10/17/2022. Best callback is [phone].

## 2022-11-02 ENCOUNTER — Other Ambulatory Visit: Payer: Self-pay | Admitting: Internal Medicine

## 2022-11-15 NOTE — Progress Notes (Signed)
Carelink Summary Report / Loop Recorder 

## 2022-11-18 ENCOUNTER — Other Ambulatory Visit: Payer: Self-pay | Admitting: Internal Medicine

## 2022-11-18 ENCOUNTER — Other Ambulatory Visit: Payer: Self-pay | Admitting: Orthopedic Surgery

## 2022-11-19 LAB — CUP PACEART REMOTE DEVICE CHECK
Date Time Interrogation Session: 20240624230456
Implantable Pulse Generator Implant Date: 20220802

## 2022-11-25 ENCOUNTER — Inpatient Hospital Stay (INDEPENDENT_AMBULATORY_CARE_PROVIDER_SITE_OTHER): Payer: Medicare Other

## 2022-11-25 DIAGNOSIS — I639 Cerebral infarction, unspecified: Secondary | ICD-10-CM

## 2022-11-27 DIAGNOSIS — H5203 Hypermetropia, bilateral: Secondary | ICD-10-CM | POA: Diagnosis not present

## 2022-11-27 DIAGNOSIS — E119 Type 2 diabetes mellitus without complications: Secondary | ICD-10-CM | POA: Diagnosis not present

## 2022-11-27 DIAGNOSIS — H25813 Combined forms of age-related cataract, bilateral: Secondary | ICD-10-CM | POA: Diagnosis not present

## 2022-11-27 LAB — HM DIABETES EYE EXAM

## 2022-12-04 ENCOUNTER — Telehealth: Payer: Self-pay | Admitting: Orthopedic Surgery

## 2022-12-04 NOTE — Telephone Encounter (Signed)
Received call from patient's daughter advised patient is in a great deal of pain and needed an appointment to see Dr. Christell Constant as soon as possible. Burna Mortimer asked if patient can be worked into Dr. Kathi Der schedule for her neck and back.  Burna Mortimer advised the Tylenol 3 is not helping with the pain and asked if patient can get something a little stronger?  The number to contact patient is  [phone]

## 2022-12-05 ENCOUNTER — Ambulatory Visit (INDEPENDENT_AMBULATORY_CARE_PROVIDER_SITE_OTHER): Payer: Medicare Other | Admitting: Orthopedic Surgery

## 2022-12-05 DIAGNOSIS — M2428 Disorder of ligament, vertebrae: Secondary | ICD-10-CM

## 2022-12-05 MED ORDER — HYDROCODONE-ACETAMINOPHEN 5-325 MG PO TABS: 1.0000 | ORAL_TABLET | ORAL | 0 refills | Status: AC | PRN

## 2022-12-05 NOTE — Progress Notes (Signed)
Orthopedic Spine Surgery Office Note   Assessment: Patient is a 72 y.o. female with neck pain that now radiates into bilateral shoulders and has gotten more severe since I last saw her.  She has also developed unsteadiness with gait and has had to start using a cane for balance.  She has central stenosis at C3/4, central stenosis at C4/5, and bilateral foraminal stenosis at C4/5.   Plan: -Patient has tried Tylenol, NSAIDs, left shoulder injection, cervical spine injection, Tylenol 3 -Today, her exam again points to shoulder as being contributory to her pain but she also has a lot of pain into the shoulders at rest which is not consistent with shoulder as the etiology.  She has also developed unsteadiness with gait and has had to start using a cane. -Given the progression of her symptoms with worsening severity of the pain and development of unsteadiness which goes along with a diagnosis of myelopathy, discussed C3-5 ACDF as a treatment for her.  The risks were covered with her and after which she wanted to proceed with surgery. -Has a large irregular disc herniation, so ordered a CT scan to evaluate for OPLL because if she has that, then would do surgery posteriorly.  I told her I would call her if I need to change the surgical plan -Patient will next be seen at the date of surgery     The patient has signs and symptoms consistent with cervical myeloradiculopathy. The most common natural history of cervical myelopathy was explained to the patient. Given this course, operative management in the form of C3-5 anterior cervical discectomy and fusion was recommended to the patient. The risks, including but not limited to pseudarthrosis, dysphagia, hematoma, airway compromise, recurrent laryngeal nerve injury, esophageal perforation, durotomy, spinal cord injury, nerve root injury, persistent pain, adjacent segment disease, infection, bleeding, hardware failure, vascular injury, heart attack, death, stroke,  fracture, and need for additional procedures were discussed with the patient. The benefit of surgery would be preventing progression of the myelopathy and not to reverse any myelopathic symptoms. Explained that the surgery is likely to give her some benefit of the radiating arm pain.  I told her that she may not get full relief of her pain with this surgery, especially any neck pain. The alternatives to surgical management would be continued monitoring, physical therapy, over-the-counter pain medications, ambulatory aids, and activity modification. I reiterated that these modalities would not change the natural history of the disease and that is why operative management has been recommended. All the patient's questions were answered to her satisfaction. After this discussion, the patient expressed understanding and elected to proceed with surgical intervention.      ___________________________________________________________________________     History:   Patient is a 72 y.o. female who is returning to the office to discuss her cervical spine.  She has now had over 6 months of neck pain that radiates into her left upper extremity.  Since she was last seen, she has developed pain radiating into the right trapezius and lateral aspect of the shoulder.  It does not radiate past the shoulder on the right side.  On the left side, most of her pain is located in the lateral aspect of the shoulder but it often does radiate into the lateral aspect of the arm.  It does not radiate past the elbow.  She notes pain is worse in the shoulder with overhead activity but also has significant pain at rest.  She has developed unsteadiness with gait since I  last saw her as well.  She comes in today with a cane because she feels like she will fall if she does not have that extra support.  She says she has had several years of dropping objects with her hands but no recent changes.  She has not noticed any other clumsiness with her  hands including difficulty with buttoning shirts, texting, or handwriting.  She has not noticed any bowel or bladder incontinence.  No saddle anesthesia.  Denies numbness and paresthesias.   Treatments tried: Tylenol, NSAIDs, left shoulder injection, cervical spine injection, Tylenol 3     Physical Exam:   General: no acute distress, appears stated age Neurologic: alert, answering questions appropriately, following commands Respiratory: unlabored breathing on room air, symmetric chest rise Psychiatric: appropriate affect, normal cadence to speech     MSK (spine):   -Strength exam                                                   Left                  Right Grip strength               5/5                 5/5 Interosseus                  5/5                  5/5 Wrist extension            5/5                  5/5 Wrist flexion                 5/5                  5/5 Elbow flexion                5/5                5/5 Deltoid                          4+/5                4+/5   EHL                              4/5                  4/5 TA                                 5/5                  5/5 GSC                             5/5                  5/5 Knee extension            5/5  5/5 Hip flexion                    5/5                  5/5     -Sensory exam                           Sensation intact to light touch in L3-S1 nerve distributions of bilateral lower extremities             Sensation intact to light touch in C5-T1 nerve distributions of bilateral upper extremities   -Brachioradialis DTR: 1/4 on the left, 1/4 on the right -Biceps DTR: 1/4 on the left, 1/4 on the right -Achilles DTR: 1/4 on the left, 1/4 on the right -Patellar tendon DTR: 1/4 on the left, 1/4 on the right   -Spurling: Negative bilaterally -Hoffman sign: Negative bilaterally -Clonus: No beats bilaterally -Interosseous wasting: None seen -Negative grip and release test -Negative  Romberg -Wide-based gait, ambulates with cane   Bilateral shoulder exam: Pain with Jobe test and weakness, negative drop arm sign, no weakness with external rotation with arm at side, negative belly press, positive Hawkins   Tinel's at wrist: Negative bilaterally Phalen's at wrist: Negative bilaterally Durkan's: Negative bilaterally   Tinel's at elbow: Negative bilaterally   Imaging: XR of the cervical spine from 07/15/2022 and 08/14/2022 was previously independently reviewed and interpreted, showing disc height loss and anterior osteophyte formation at C5/6 and C6/7.  No evidence of instability on flexion/extension views.  No fracture or dislocation seen.  Lordotic alignment.   MRI of the cervical spine from 07/29/2022 was previously independently reviewed and interpreted, showing right sided paracentral disc herniation at C3-4 with central stenosis.  Bilateral foraminal stenosis at C4/5.  Central stenosis at that level as well.  Right-sided foraminal stenosis at C5/6.     Patient name: Tiffany [name] Patient MRN: [id number] Date of visit: 12/05/22

## 2022-12-05 NOTE — Telephone Encounter (Signed)
Patient aware and coming in at 3:45

## 2022-12-06 ENCOUNTER — Telehealth: Payer: Self-pay | Admitting: Internal Medicine

## 2022-12-06 ENCOUNTER — Telehealth: Payer: Self-pay | Admitting: *Deleted

## 2022-12-06 NOTE — Telephone Encounter (Signed)
In further evaluation of patient's chart, we saw the patient briefly for loop recorder placement regarding her CVA.  She does not routinely follow with heart care therefore clearance should come from her neurologist and primary care doctor.  Please let us know if there is anything else we can do.  Sharlene Dory, PA-C

## 2022-12-06 NOTE — Telephone Encounter (Signed)
Patient wants an order put in for her 3D mammogram to the breast center.  Please call patient after this is ordered.  Phone:  [phone]

## 2022-12-06 NOTE — Telephone Encounter (Signed)
Done hardcopy to cma °

## 2022-12-06 NOTE — Telephone Encounter (Signed)
Rec'd form place in MD incoming basket to sign.Marland KitchenRaechel Chute

## 2022-12-06 NOTE — Telephone Encounter (Signed)
   [facility] Pre-operative Risk Assessment    Patient Name: Tiffany [name]  DOB: 15-Oct-1950 MRN: [id number]  HEARTCARE STAFF:  - IMPORTANT!!!!!! Under Visit Info/Reason for Call, type in Other and utilize the format Clearance MM/DD/YY or Clearance TBD. Do not use dashes or single digits. - Please review there is not already an duplicate clearance open for this procedure. - If request is for dental extraction, please clarify the # of teeth to be extracted. - If the patient is currently at the dentist's office, call Pre-Op Callback Staff (MA/nurse) to input urgent request.  - If the patient is not currently in the dentist office, please route to the Pre-Op pool.  Request for surgical clearance:  What type of surgery is being performed? C3-5 Anterior Cervical Discectomy & Fusion  When is this surgery scheduled? TBD  What type of clearance is required (medical clearance vs. Pharmacy clearance to hold med vs. Both)? Both  Are there any medications that need to be held prior to surgery and how long? Asa 325 mg 7 days prior  Practice name and name of physician performing surgery? [facility], Dr Christell Constant  What is the office phone number? [phone]   7.   What is the office fax number? [phone] Attn:April  8.   Anesthesia type (None, local, MAC, general) ? General   Tiffany [name] 12/06/2022, 10:02 AM  _________________________________________________________________   (provider comments below)

## 2022-12-06 NOTE — Telephone Encounter (Signed)
   Name: Tiffany [name]  DOB: 07/18/1950  MRN: [id number]  Primary Cardiologist: None  Chart reviewed as part of pre-operative protocol coverage. Because of Tiffany [name]'s past medical history and time since last visit, she will require a follow-up in-office visit in order to better assess preoperative cardiovascular risk.  Pre-op covering staff: - Please schedule appointment and call patient to inform them. If patient already had an upcoming appointment within acceptable timeframe, please add "pre-op clearance" to the appointment notes so provider is aware. - Please contact requesting surgeon's office via preferred method (i.e, phone, fax) to inform them of need for appointment prior to surgery.  ASA not prescribed by our team.  Please contact prescribing physician for holding parameters.   Sharlene Dory, PA-C  12/06/2022, 12:48 PM

## 2022-12-06 NOTE — Telephone Encounter (Signed)
MD signed medical clearance fax back to [facility] at [facility] [phone].Marland KitchenShearon Stalls

## 2022-12-06 NOTE — Telephone Encounter (Signed)
Surgical clearance received from [facility] at [place] and placed in provider box up front. Please fax to April Beavers at [phone].

## 2022-12-06 NOTE — Telephone Encounter (Signed)
Sorry I dont order this as this is outside the scope of my practice  I can order screening or diagnostic mammograms only   Just let me know    thanks

## 2022-12-12 ENCOUNTER — Telehealth: Payer: Self-pay | Admitting: Orthopedic Surgery

## 2022-12-12 NOTE — Telephone Encounter (Signed)
Note already written about her DEXA results. This is an erroneous new encounter.

## 2022-12-12 NOTE — Telephone Encounter (Signed)
Brief Ortho Note  DEXA scan result received from [facility] mammography from 10/24/2022.  T-score of -1.  This is not consistent with osteoporosis.  Patient does not need any treatment prior to surgical intervention.  Already had discussed surgery with her at her last visit, will plan to see her next at date of surgery.  London Sheer, MD Orthopedic Surgeon

## 2022-12-12 NOTE — Progress Notes (Signed)
Carelink Summary Report / Loop Recorder 

## 2022-12-18 ENCOUNTER — Other Ambulatory Visit: Payer: Self-pay | Admitting: Internal Medicine

## 2022-12-19 ENCOUNTER — Other Ambulatory Visit: Payer: Self-pay

## 2022-12-19 ENCOUNTER — Telehealth: Payer: Self-pay | Admitting: Orthopedic Surgery

## 2022-12-19 MED ORDER — HYDROCODONE-ACETAMINOPHEN 5-325 MG PO TABS: 1.0000 | ORAL_TABLET | Freq: Four times a day (QID) | ORAL | 0 refills | Status: AC | PRN

## 2022-12-19 NOTE — Telephone Encounter (Signed)
Pt called requesting hydrocodone refill sent to [facility]

## 2022-12-23 ENCOUNTER — Ambulatory Visit: Payer: Medicare Other | Admitting: Podiatry

## 2022-12-23 DIAGNOSIS — E1165 Type 2 diabetes mellitus with hyperglycemia: Secondary | ICD-10-CM | POA: Diagnosis not present

## 2022-12-23 DIAGNOSIS — Z794 Long term (current) use of insulin: Secondary | ICD-10-CM

## 2022-12-23 DIAGNOSIS — Q828 Other specified congenital malformations of skin: Secondary | ICD-10-CM | POA: Diagnosis not present

## 2022-12-23 NOTE — Progress Notes (Unsigned)
    Subjective:  Patient ID: Tiffany [name], female    DOB: [birth year],  MRN: [id number]  Tiffany [name] presents to clinic today for: Painful plantar calluses bilateral forefoot  PCP is Corwin Levins, MD.  Past Medical History:  Diagnosis Date   Allergy    Degenerative joint disease    Diabetes mellitus    GERD (gastroesophageal reflux disease)    Headache    Hyperlipidemia    Hypertension    Stroke [facility])     Past Surgical History:  Procedure Laterality Date   ABDOMINAL HYSTERECTOMY  1976   BACK SURGERY     KNEE ARTHROSCOPY     left x 2   LUMBAR LAMINECTOMY  1989    Allergies  Allergen Reactions   Contrast Media [Iodinated Contrast Media] Anaphylaxis, Nausea And Vomiting, Swelling and Other (See Comments)    Patients throat closes with nausea and vomiting    Aspirin Nausea And Vomiting and Other (See Comments)    Can take EC aspirin   Gadolinium Derivatives Nausea And Vomiting   Metformin And Related Nausea And Vomiting and Other (See Comments)    Severe nausea and vomiting   Shellfish Allergy Nausea And Vomiting   Tramadol Nausea And Vomiting and Other (See Comments)    Migraines   Penicillins Nausea And Vomiting   Statins Other (See Comments)    Myalgias    Objective:  There were no vitals filed for this visit.  Tiffany [name] is a pleasant 72 y.o. female in NAD. AAO x 3.  Vascular Examination: Capillary refill time is 3-5 seconds to toes bilateral. Palpable pedal pulses b/l LE. Digital hair present b/l. No pedal edema b/l. Skin temperature gradient WNL b/l. No varicosities b/l. No cyanosis or clubbing noted b/l.   Dermatological Examination: Pedal skin with normal turgor, texture and tone b/l. No open wounds. No interdigital macerations b/l.   Hyperkeratotic lesion present, with pain on palpation, located bilateral submet 1 and 5, left submet 3.     Latest Ref Rng & Units 10/17/2022    3:20 PM 01/10/2022    4:40 PM  Hemoglobin A1C  Hemoglobin-A1c  4.6 - 6.5 % 6.7  6.9    Assessment/Plan: 1. Porokeratosis   2. Type 2 diabetes mellitus with hyperglycemia, with long-term current use of insulin (HCC)     The hyperkeratotic lesions were sharply debrided/shaved with sterile #313 blade.  Discussed with the patient what is causing the corns/calluses and reviewed treatment options today, including shaving the the painful lesion(s), off-loading techniques and pads, custom orthotics / shoe modifications.    Return if symptoms worsen or fail to improve.   Clerance Lav, DPM, FACFAS [facility]     [address] [place], [place] [address]                Office [phone]  Fax [phone]

## 2022-12-26 ENCOUNTER — Ambulatory Visit
Admission: RE | Admit: 2022-12-26 | Discharge: 2022-12-26 | Disposition: A | Discharge status: Home / Self Care | Payer: Medicare Other | Source: Ambulatory Visit | Attending: Orthopedic Surgery | Admitting: Orthopedic Surgery

## 2022-12-26 DIAGNOSIS — M47812 Spondylosis without myelopathy or radiculopathy, cervical region: Secondary | ICD-10-CM | POA: Diagnosis not present

## 2022-12-26 DIAGNOSIS — M5021 Other cervical disc displacement,  high cervical region: Secondary | ICD-10-CM | POA: Diagnosis not present

## 2022-12-26 DIAGNOSIS — M50221 Other cervical disc displacement at C4-C5 level: Secondary | ICD-10-CM | POA: Diagnosis not present

## 2022-12-26 DIAGNOSIS — M2428 Disorder of ligament, vertebrae: Secondary | ICD-10-CM

## 2022-12-30 ENCOUNTER — Inpatient Hospital Stay (INDEPENDENT_AMBULATORY_CARE_PROVIDER_SITE_OTHER): Payer: Medicare Other

## 2022-12-30 DIAGNOSIS — I639 Cerebral infarction, unspecified: Secondary | ICD-10-CM

## 2023-01-08 ENCOUNTER — Telehealth: Payer: Self-pay | Admitting: Orthopedic Surgery

## 2023-01-08 MED ORDER — HYDROCODONE-ACETAMINOPHEN 5-325 MG PO TABS: 1.0000 | ORAL_TABLET | Freq: Four times a day (QID) | ORAL | 0 refills | Status: AC | PRN

## 2023-01-08 NOTE — Telephone Encounter (Signed)
Patient called needing Rx refilled Hydrocodone. The number to contact patient is [phone]

## 2023-01-10 NOTE — Progress Notes (Signed)
Carelink Summary Report / Loop Recorder 

## 2023-01-13 ENCOUNTER — Telehealth: Payer: Self-pay | Admitting: Orthopedic Surgery

## 2023-01-13 NOTE — Telephone Encounter (Signed)
Patient's son called asked for a call back concerning the next plan of care for his mother? Tiffany [name] said his mother's neck and shoulder are getting worse. The number to contact Tiffany [name] is [phone]

## 2023-01-13 NOTE — Telephone Encounter (Signed)
I called and advised both the patient and [name] of this. Patient did give me verbal auth to speak with [facility]

## 2023-01-14 DIAGNOSIS — G8191 Hemiplegia, unspecified affecting right dominant side: Secondary | ICD-10-CM | POA: Diagnosis not present

## 2023-01-14 DIAGNOSIS — Z87891 Personal history of nicotine dependence: Secondary | ICD-10-CM | POA: Diagnosis not present

## 2023-01-14 NOTE — Telephone Encounter (Signed)
Yes, ok to hold asa for 5 days prior

## 2023-01-14 NOTE — Telephone Encounter (Signed)
Tiffany [name] with [facility] called states she needs to know if patient can stop her asprin 5 days before surgery. Best callback is [phone].

## 2023-01-15 NOTE — Telephone Encounter (Signed)
[facility] has sent additional paperwork regarding holding aspirin prior to surgery. Placed in provider's box up front. Please fax to April B at [phone] when complete.

## 2023-01-16 NOTE — Telephone Encounter (Signed)
Placed on providers desk

## 2023-01-17 NOTE — Telephone Encounter (Signed)
Forms have been faxed 

## 2023-01-23 LAB — LAB REPORT - SCANNED
Creatinine, POC: 0.67 mg/dL
EGFR: 90

## 2023-01-24 ENCOUNTER — Telehealth: Payer: Self-pay | Admitting: Orthopedic Surgery

## 2023-01-24 LAB — LAB REPORT - SCANNED: Microalb Creat Ratio: 4

## 2023-01-24 NOTE — Telephone Encounter (Signed)
Patient called and need a refill on pain medication. [facility] is where she needs it sent to. CB#[phone]

## 2023-01-27 MED ORDER — HYDROCODONE-ACETAMINOPHEN 5-325 MG PO TABS: 1.0000 | ORAL_TABLET | Freq: Four times a day (QID) | ORAL | 0 refills | Status: AC | PRN

## 2023-01-27 NOTE — Addendum Note (Signed)
Addended by: Willia Craze on: 01/27/2023 07:05 AM   Modules accepted: Orders

## 2023-01-28 ENCOUNTER — Other Ambulatory Visit: Payer: Self-pay | Admitting: Internal Medicine

## 2023-01-28 ENCOUNTER — Other Ambulatory Visit: Payer: Self-pay

## 2023-02-03 ENCOUNTER — Inpatient Hospital Stay (INDEPENDENT_AMBULATORY_CARE_PROVIDER_SITE_OTHER): Payer: Medicare Other

## 2023-02-03 DIAGNOSIS — I639 Cerebral infarction, unspecified: Secondary | ICD-10-CM | POA: Diagnosis not present

## 2023-02-03 LAB — CUP PACEART REMOTE DEVICE CHECK
Date Time Interrogation Session: 20240906230604
Implantable Pulse Generator Implant Date: 20220802

## 2023-02-03 NOTE — Progress Notes (Signed)
Surgical Instructions   Your procedure is scheduled on Sept 20,2024 . Report to [facility] Main Entrance "A" at 10:35 A.M., then check in with the Admitting office. Any questions or running late day of surgery: call [phone]  Questions prior to your surgery date: call [phone], Monday-Friday, 8am-4pm. If you experience any cold or flu symptoms such as cough, fever, chills, shortness of breath, etc. between now and your scheduled surgery, please notify us at the above number.     Remember:  Do not eat after midnight the night before your surgery  You may drink clear liquids until 9:35 A.M. the morning of your surgery.   Clear liquids allowed are: Water, Non-Citrus Juices (without pulp), Carbonated Beverages, Clear Tea, Black Coffee Only (NO MILK, CREAM OR POWDERED CREAMER of any kind), and Gatorade.    Take these medicines the morning of surgery with A SIP OF WATER  amLODipine (NORVASC)  aspirin EC  ezetimibe (ZETIA)  famotidine (PEPCID)  gabapentin (NEURONTIN)  pantoprazole (PROTONIX)  pravastatin (PRAVACHOL)    May take these medicines IF NEEDED: cetirizine (ZYRTEC)  cyclobenzaprine (FLEXERIL)  EPINEPHRINE  VENTOLIN  inhaler MAY BRING WITH YOU  DAY OF SURGERY  triamcinolone (NASACORT)    Patient Instructions  The night before surgery:  No food after midnight. ONLY clear liquids after midnight  The day of surgery (if you do NOT have diabetes):  Drink ONE (1) Pre-Surgery Clear Ensure by  the morning of surgery. Drink in one sitting. Do not sip.  This drink was given to you during your hospital  pre-op appointment visit.  Nothing else to drink after completing the  Pre-Surgery Clear Ensure.  The day of surgery (if you have diabetes): Drink ONE (1) 12 oz G2 given to you in your pre admission testing appointment by  the morning of surgery. Drink in one sitting. Do not sip.  This drink was given to you during your hospital  pre-op appointment visit.   Nothing else to drink after completing the  12 oz bottle of G2.         If you have questions, please contact your surgeon's office.       One week prior to surgery, STOP taking any Aspirin (unless otherwise instructed by your surgeon) Aleve, Naproxen, Ibuprofen, Motrin, Advil, Goody's, BC's, all herbal medications, fish oil, and non-prescription vitamins. This includes your  meloxicam (MOBIC).    WHAT DO I DO ABOUT MY DIABETES MEDICATION?   Do not take oral diabetes medicines (pills) the morning of surgery.  THE NIGHT BEFORE SURGERY, take 22.5 units of insulin glargine (LANTUS SOLOSTAR) insulin.         The day of surgery, do not take other diabetes injectables, including Byetta (exenatide), Bydureon (exenatide ER), Victoza (liraglutide), or Trulicity (dulaglutide).  If your CBG is greater than 220 mg/dL, you may take  of your sliding scale (correction) dose of insulin.   HOW TO MANAGE YOUR DIABETES BEFORE AND AFTER SURGERY  Why is it important to control my blood sugar before and after surgery? Improving blood sugar levels before and after surgery helps healing and can limit problems. A way of improving blood sugar control is eating a healthy diet by:  Eating less sugar and carbohydrates  Increasing activity/exercise  Talking with your doctor about reaching your blood sugar goals High blood sugars (greater than 180 mg/dL) can raise your risk of infections and slow your recovery, so you will need to focus on controlling your diabetes during the weeks  before surgery. Make sure that the doctor who takes care of your diabetes knows about your planned surgery including the date and location.  How do I manage my blood sugar before surgery? Check your blood sugar at least 4 times a day, starting 2 days before surgery, to make sure that the level is not too high or low.  Check your blood sugar the morning of your surgery when you wake up and every 2 hours until you get to the  Short Stay unit.  If your blood sugar is less than 70 mg/dL, you will need to treat for low blood sugar: Do not take insulin. Treat a low blood sugar (less than 70 mg/dL) with  cup of clear juice (cranberry or apple), 4 glucose tablets, OR glucose gel. Recheck blood sugar in 15 minutes after treatment (to make sure it is greater than 70 mg/dL). If your blood sugar is not greater than 70 mg/dL on recheck, call [phone] for further instructions. Report your blood sugar to the short stay nurse when you get to Short Stay.  If you are admitted to the hospital after surgery: Your blood sugar will be checked by the staff and you will probably be given insulin after surgery (instead of oral diabetes medicines) to make sure you have good blood sugar levels. The goal for blood sugar control after surgery is 80-180 mg/dL.                         Do NOT Smoke (Tobacco/Vaping) for 24 hours prior to your procedure.  If you use a CPAP at night, you may bring your mask/headgear for your overnight stay.   You will be asked to remove any contacts, glasses, piercing's, hearing aid's, dentures/partials prior to surgery. Please bring cases for these items if needed.    Patients discharged the day of surgery will not be allowed to drive home, and someone needs to stay with them for 24 hours.  SURGICAL WAITING ROOM VISITATION Patients may have no more than 2 support people in the waiting area - these visitors may rotate.   Pre-op nurse will coordinate an appropriate time for 1 ADULT support person, who may not rotate, to accompany patient in pre-op.  Children under the age of 24 must have an adult with them who is not the patient and must remain in the main waiting area with an adult.  If the patient needs to stay at the hospital during part of their recovery, the visitor guidelines for inpatient rooms apply.  Please refer to the [facility] website for the visitor guidelines for any additional  information.   If you received a COVID test during your pre-op visit  it is requested that you wear a mask when out in public, stay away from anyone that may not be feeling well and notify your surgeon if you develop symptoms. If you have been in contact with anyone that has tested positive in the last 10 days please notify you surgeon.   Please read over the following fact sheets that you were given.  Special instructions:    Pre-operative 5 CHG Bathing Instructions   You can play a key role in reducing the risk of infection after surgery. Your skin needs to be as free of germs as possible. You can reduce the number of germs on your skin by washing with CHG (chlorhexidine gluconate) soap before surgery. CHG is an antiseptic soap that kills germs and continues to kill  germs even after washing.   DO NOT use if you have an allergy to chlorhexidine/CHG or antibacterial soaps. If your skin becomes reddened or irritated, stop using the CHG and notify one of our RNs at [phone].   Please shower with the CHG soap starting 4 days before surgery using the following schedule:     Please keep in mind the following:  DO NOT shave, including legs and underarms, starting the day of your first shower.   You may shave your face at any point before/day of surgery.  Place clean sheets on your bed the day you start using CHG soap. Use a clean washcloth (not used since being washed) for each shower. DO NOT sleep with pets once you start using the CHG.   CHG Shower Instructions:  If you choose to wash your hair and private area, wash first with your normal shampoo/soap.  After you use shampoo/soap, rinse your hair and body thoroughly to remove shampoo/soap residue.  Turn the water OFF and apply about 3 tablespoons (45 ml) of CHG soap to a CLEAN washcloth.  Apply CHG soap ONLY FROM YOUR NECK DOWN TO YOUR TOES (washing for 3-5 minutes)  DO NOT use CHG soap on face, private areas, open wounds, or sores.   Pay special attention to the area where your surgery is being performed.  If you are having back surgery, having someone wash your back for you may be helpful. Wait 2 minutes after CHG soap is applied, then you may rinse off the CHG soap.  Pat dry with a clean towel  Put on clean clothes/pajamas   If you choose to wear lotion, please use ONLY the CHG-compatible lotions on the back of this paper.    Day of Surgery: Take a shower with CHG soap. Do not wear jewelry or makeup Do not wear lotions, powders, perfumes/colognes, or deodorant. Do not shave 48 hours prior to surgery.  Men may shave face and neck. Do not bring valuables to the hospital.  [facility] is not responsible for any belongings or valuables. Do not wear nail polish, gel polish, artificial nails, or any other type of covering on natural nails (fingers and toes) If you have artificial nails or gel coating that need to be removed by a nail salon, please have this removed prior to surgery. Artificial nails or gel coating may interfere with anesthesia's ability to adequately monitor your vital signs. Wear Clean/Comfortable clothing the morning of surgery Remember to brush your teeth WITH YOUR REGULAR TOOTHPASTE.       CHG Compatible Lotions   Aveeno Moisturizing lotion  Cetaphil Moisturizing Cream  Cetaphil Moisturizing Lotion  Clairol Herbal Essence Moisturizing Lotion, Dry Skin  Clairol Herbal Essence Moisturizing Lotion, Extra Dry Skin  Clairol Herbal Essence Moisturizing Lotion, Normal Skin  Curel Age Defying Therapeutic Moisturizing Lotion with Alpha Hydroxy  Curel Extreme Care Body Lotion  Curel Soothing Hands Moisturizing Hand Lotion  Curel Therapeutic Moisturizing Cream, Fragrance-Free  Curel Therapeutic Moisturizing Lotion, Fragrance-Free  Curel Therapeutic Moisturizing Lotion, Original Formula  Eucerin Daily Replenishing Lotion  Eucerin Dry Skin Therapy Plus Alpha Hydroxy Crme  Eucerin Dry Skin Therapy  Plus Alpha Hydroxy Lotion  Eucerin Original Crme  Eucerin Original Lotion  Eucerin Plus Crme Eucerin Plus Lotion  Eucerin TriLipid Replenishing Lotion  Keri Anti-Bacterial Hand Lotion  Keri Deep Conditioning Original Lotion Dry Skin Formula Softly Scented  Keri Deep Conditioning Original Lotion, Fragrance Free Sensitive Skin Formula  Keri Lotion Fast Absorbing Fragrance Free Sensitive Skin  Formula  Keri Lotion Fast Absorbing Softly Scented Dry Skin Formula  Keri Original Lotion  Keri Skin Renewal Lotion Keri Silky Smooth Lotion  Keri Silky Smooth Sensitive Skin Lotion  Nivea Body Creamy Conditioning Oil  Nivea Body Extra Enriched Lotion  Nivea Body Original Lotion  Nivea Body Sheer Moisturizing Lotion Nivea Crme  Nivea Skin Firming Lotion  NutraDerm 30 Skin Lotion  NutraDerm Skin Lotion  NutraDerm Therapeutic Skin Cream  NutraDerm Therapeutic Skin Lotion  ProShield Protective Hand Cream  Provon moisturizing lotion    If you received a COVID test during your pre-op visit  it is requested that you wear a mask when out in public, stay away from anyone that may not be feeling well and notify your surgeon if you develop symptoms. If you have been in contact with anyone that has tested positive in the last 10 days please notify you surgeon.

## 2023-02-04 ENCOUNTER — Encounter: Payer: Self-pay | Admitting: Cardiovascular Disease

## 2023-02-04 ENCOUNTER — Other Ambulatory Visit: Payer: Self-pay

## 2023-02-04 ENCOUNTER — Encounter (HOSPITAL_COMMUNITY)
Admission: RE | Admit: 2023-02-04 | Discharge: 2023-02-04 | Disposition: A | Discharge status: Home / Self Care | Payer: Medicare Other | Source: Ambulatory Visit | Attending: Orthopedic Surgery | Admitting: Orthopedic Surgery

## 2023-02-04 ENCOUNTER — Encounter (HOSPITAL_COMMUNITY): Payer: Self-pay

## 2023-02-04 VITALS — BP 143/88 | HR 95 | Temp 98.4°F | Resp 20 | Ht 62.0 in | Wt 162.4 lb

## 2023-02-04 DIAGNOSIS — R9431 Abnormal electrocardiogram [ECG] [EKG]: Secondary | ICD-10-CM | POA: Insufficient documentation

## 2023-02-04 DIAGNOSIS — Z794 Long term (current) use of insulin: Secondary | ICD-10-CM | POA: Diagnosis not present

## 2023-02-04 DIAGNOSIS — G549 Nerve root and plexus disorder, unspecified: Secondary | ICD-10-CM | POA: Insufficient documentation

## 2023-02-04 DIAGNOSIS — I4510 Unspecified right bundle-branch block: Secondary | ICD-10-CM | POA: Insufficient documentation

## 2023-02-04 DIAGNOSIS — I10 Essential (primary) hypertension: Secondary | ICD-10-CM | POA: Diagnosis not present

## 2023-02-04 DIAGNOSIS — E1165 Type 2 diabetes mellitus with hyperglycemia: Secondary | ICD-10-CM | POA: Insufficient documentation

## 2023-02-04 DIAGNOSIS — Z01812 Encounter for preprocedural laboratory examination: Secondary | ICD-10-CM | POA: Insufficient documentation

## 2023-02-04 DIAGNOSIS — L84 Corns and callosities: Secondary | ICD-10-CM | POA: Insufficient documentation

## 2023-02-04 DIAGNOSIS — Z01818 Encounter for other preprocedural examination: Secondary | ICD-10-CM | POA: Diagnosis not present

## 2023-02-04 DIAGNOSIS — K219 Gastro-esophageal reflux disease without esophagitis: Secondary | ICD-10-CM | POA: Diagnosis not present

## 2023-02-04 DIAGNOSIS — Z01810 Encounter for preprocedural cardiovascular examination: Secondary | ICD-10-CM | POA: Insufficient documentation

## 2023-02-04 HISTORY — DX: Chronic kidney disease, unspecified: N18.9

## 2023-02-04 LAB — CBC
HCT: 37.9 % (ref 36.0–46.0)
Hemoglobin: 11.9 g/dL — ABNORMAL LOW (ref 12.0–15.0)
MCH: 27.9 pg (ref 26.0–34.0)
MCHC: 31.4 g/dL (ref 30.0–36.0)
MCV: 88.8 fL (ref 80.0–100.0)
Platelets: 236 10*3/uL (ref 150–400)
RBC: 4.27 MIL/uL (ref 3.87–5.11)
RDW: 14.4 % (ref 11.5–15.5)
WBC: 4.6 10*3/uL (ref 4.0–10.5)
nRBC: 0 % (ref 0.0–0.2)

## 2023-02-04 LAB — BASIC METABOLIC PANEL
Anion gap: 11 (ref 5–15)
BUN: 15 mg/dL (ref 8–23)
CO2: 24 mmol/L (ref 22–32)
Calcium: 8.8 mg/dL — ABNORMAL LOW (ref 8.9–10.3)
Chloride: 104 mmol/L (ref 98–111)
Creatinine, Ser: 0.79 mg/dL (ref 0.44–1.00)
GFR, Estimated: >60 mL/min (ref >60)
Glucose, Bld: 109 mg/dL — ABNORMAL HIGH (ref 70–99)
Potassium: 3.9 mmol/L (ref 3.5–5.1)
Sodium: 139 mmol/L (ref 135–145)

## 2023-02-04 LAB — GLUCOSE, CAPILLARY: Glucose-Capillary: 106 mg/dL — ABNORMAL HIGH (ref 70–99)

## 2023-02-04 LAB — SURGICAL PCR SCREEN
MRSA, PCR: NEGATIVE
Staphylococcus aureus: NEGATIVE

## 2023-02-04 NOTE — Progress Notes (Signed)
PERIOPERATIVE PRESCRIPTION FOR IMPLANTED CARDIAC DEVICE PROGRAMMING  Patient Information: Name:  Tiffany [name]  DOB:  10/11/1950  MRN:  [id number]  Planned Procedure:  Cervical 3-5 anterior cervical discectomy and fusion  Surgeon:  Dr. Willia Craze  Date of Procedure:  02-14-23  Cautery will be used.  Position during surgery:  supine   Device Information:  Clinic EP Physician:  Dr. Rachelle Hora Croitoru  Patient has a [employer] [id number].  This is a monitor.  No device clearance or action needed.  Per Device Clinic Standing Orders, Wiliam Ke, RN  3:01 PM 02/04/2023

## 2023-02-04 NOTE — Progress Notes (Signed)
PCP - Dr. Oliver Barre Cardiologist - Denies  PPM/ICD - No But has a loop recorder Device Orders -  Rep Notified -   Chest x-ray - 10-18-22 EKG - 02-04-23 Stress Test - denies ECHO - 10-07-20 Cardiac Cath - denies  Sleep Study - 03-28-21 CPAP - Patient states she no longer has the need to use CPAP after retesting  Fasting Blood Sugar -  Patient states --90-100 in am. Patient blood sugar 106 at PAT appointment Checks Blood Sugar BID   Blood Thinner Instructions: denies  Aspirin Instructions: Last dose 02-05-23  ERAS Protcol -  ERAS with drink PRE-SURGERY  G2-   COVID TEST- n/a   Anesthesia review: yes Hx Stroke, HTN, and has a loop recorder   Patient denies shortness of breath, fever, cough and chest pain at PAT appointment   All instructions explained to the patient, with a verbal understanding of the material. Patient agrees to go over the instructions while at home for a better understanding. Patient also instructed to self quarantine after being tested for COVID-19. The opportunity to ask questions was provided.

## 2023-02-05 LAB — HEMOGLOBIN A1C
Hgb A1c MFr Bld: 6.3 % — ABNORMAL HIGH (ref 4.8–5.6)
Mean Plasma Glucose: 134 mg/dL

## 2023-02-05 NOTE — Anesthesia Preprocedure Evaluation (Addendum)
Anesthesia Evaluation  Patient identified by MRN, date of birth, ID band Patient awake    Reviewed: Allergy & Precautions, H&P , NPO status , Patient's Chart, lab work & pertinent test results  History of Anesthesia Complications Negative for: history of anesthetic complications  Airway Mallampati: II  TM Distance: >3 FB Neck ROM: Full    Dental no notable dental hx.    Pulmonary asthma , sleep apnea , former smoker   Pulmonary exam normal breath sounds clear to auscultation       Cardiovascular hypertension, Pt. on medications Normal cardiovascular exam Rhythm:Regular Rate:Normal   '22 TTE - EF 65 to 70%. There is moderate left ventricular hypertrophy. A small pericardial effusion is present. Trivial mitral valve regurgitation. Mild to moderate aortic valve sclerosis/calcification is present, without any evidence of aortic stenosis.     Neuro/Psych  Headaches CVA negative neurological ROS  negative psych ROS   GI/Hepatic Neg liver ROS,GERD  Medicated,,  Endo/Other  diabetes, Type 2, Insulin Dependent    Renal/GU negative Renal ROS  negative genitourinary   Musculoskeletal negative musculoskeletal ROS (+) Arthritis ,    Abdominal   Peds negative pediatric ROS (+)  Hematology negative hematology ROS (+)   Anesthesia Other Findings   Reproductive/Obstetrics negative OB ROS                             Anesthesia Physical Anesthesia Plan  ASA: 3  Anesthesia Plan: General   Post-op Pain Management: Ofirmev IV (intra-op)* and Ketamine IV*   Induction: Intravenous  PONV Risk Score and Plan: 3 and Treatment may vary due to age or medical condition, Ondansetron and TIVA  Airway Management Planned: Oral ETT and Video Laryngoscope Planned  Additional Equipment: None  Intra-op Plan:   Post-operative Plan: Extubation in OR  Informed Consent:   Plan Discussed with: CRNA and  Surgeon  Anesthesia Plan Comments: (See PAT note   Neuromonitoring )         Anesthesia Quick Evaluation

## 2023-02-05 NOTE — Progress Notes (Addendum)
Anesthesia Chart Review:  72 year old female with pertinent history including IDDM 2 (A1c 6.3 on 02/04/2023), GERD (on pantoprazole and famotidine), headaches, hypertension, hyperlipidemia, cryptogenic CVA s/p ILR placement 12/25/2020.  Loop recorder has not shown any clinically significant events to date.  Last transmission 01/31/2023 was unremarkable.  Patient has been cleared by her PCP Dr. Oliver Barre to hold aspirin 5 days prior to surgery.  Severe spinal canal stenosis at C3-C4 and C4-C5 with deformation of the spinal cord noted on cervical MRI 07/2022.   Preop labs reviewed, unremarkable.  EKG 02/04/2023: NSR.  Rate 87.  Left axis deviation.  Right bundle branch block.  TTE 10/07/2020: 1. Left ventricular ejection fraction, by estimation, is 65 to 70%. The  left ventricle has normal function. The left ventricle has no regional  wall motion abnormalities. There is moderate left ventricular hypertrophy.  Left ventricular diastolic  parameters are indeterminate.   2. Right ventricular systolic function is normal. The right ventricular  size is normal. Tricuspid regurgitation signal is inadequate for assessing  PA pressure.   3. A small pericardial effusion is present. The pericardial effusion is  posterior to the left ventricle.   4. The mitral valve is grossly normal. Trivial mitral valve  regurgitation.   5. The aortic valve is tricuspid. Aortic valve regurgitation is not  visualized. Mild to moderate aortic valve sclerosis/calcification is  present, without any evidence of aortic stenosis. Aortic valve mean  gradient measures 2.0 mmHg.   6. The inferior vena cava is normal in size with greater than 50%  respiratory variability, suggesting right atrial pressure of 3 mmHg.      Zannie Cove [facility] Short Stay Center/Anesthesiology Phone [phone] 02/05/2023 12:33 PM

## 2023-02-14 ENCOUNTER — Other Ambulatory Visit: Payer: Self-pay

## 2023-02-14 ENCOUNTER — Ambulatory Visit (HOSPITAL_COMMUNITY): Payer: Medicare Other

## 2023-02-14 ENCOUNTER — Inpatient Hospital Stay (HOSPITAL_COMMUNITY)
Admission: AD | Admit: 2023-02-14 | Discharge: 2023-02-16 | DRG: 472 | Disposition: A | Discharge status: Home / Self Care | Payer: Medicare Other | Attending: Orthopedic Surgery | Admitting: Orthopedic Surgery

## 2023-02-14 ENCOUNTER — Encounter (HOSPITAL_COMMUNITY): Payer: Self-pay | Admitting: Orthopedic Surgery

## 2023-02-14 ENCOUNTER — Inpatient Hospital Stay (HOSPITAL_COMMUNITY)
Admission: AD | Disposition: A | Discharge status: Home / Self Care | Payer: Self-pay | Source: Home / Self Care | Attending: Orthopedic Surgery

## 2023-02-14 ENCOUNTER — Ambulatory Visit (HOSPITAL_BASED_OUTPATIENT_CLINIC_OR_DEPARTMENT_OTHER): Payer: Medicare Other

## 2023-02-14 ENCOUNTER — Ambulatory Visit (HOSPITAL_COMMUNITY): Payer: Medicare Other | Admitting: Physician Assistant

## 2023-02-14 DIAGNOSIS — G8929 Other chronic pain: Secondary | ICD-10-CM | POA: Diagnosis not present

## 2023-02-14 DIAGNOSIS — E785 Hyperlipidemia, unspecified: Secondary | ICD-10-CM

## 2023-02-14 DIAGNOSIS — M2578 Osteophyte, vertebrae: Secondary | ICD-10-CM | POA: Diagnosis not present

## 2023-02-14 DIAGNOSIS — Z8673 Personal history of transient ischemic attack (TIA), and cerebral infarction without residual deficits: Secondary | ICD-10-CM | POA: Diagnosis not present

## 2023-02-14 DIAGNOSIS — N189 Chronic kidney disease, unspecified: Secondary | ICD-10-CM | POA: Diagnosis not present

## 2023-02-14 DIAGNOSIS — M5030 Other cervical disc degeneration, unspecified cervical region: Secondary | ICD-10-CM | POA: Diagnosis not present

## 2023-02-14 DIAGNOSIS — G549 Nerve root and plexus disorder, unspecified: Secondary | ICD-10-CM | POA: Diagnosis not present

## 2023-02-14 DIAGNOSIS — E1142 Type 2 diabetes mellitus with diabetic polyneuropathy: Secondary | ICD-10-CM | POA: Diagnosis present

## 2023-02-14 DIAGNOSIS — I10 Essential (primary) hypertension: Secondary | ICD-10-CM | POA: Diagnosis not present

## 2023-02-14 DIAGNOSIS — K219 Gastro-esophageal reflux disease without esophagitis: Secondary | ICD-10-CM | POA: Diagnosis not present

## 2023-02-14 DIAGNOSIS — M5001 Cervical disc disorder with myelopathy,  high cervical region: Secondary | ICD-10-CM | POA: Diagnosis not present

## 2023-02-14 DIAGNOSIS — M5000 Cervical disc disorder with myelopathy, unspecified cervical region: Secondary | ICD-10-CM | POA: Diagnosis not present

## 2023-02-14 DIAGNOSIS — Z87891 Personal history of nicotine dependence: Secondary | ICD-10-CM | POA: Diagnosis not present

## 2023-02-14 DIAGNOSIS — E871 Hypo-osmolality and hyponatremia: Secondary | ICD-10-CM | POA: Diagnosis not present

## 2023-02-14 DIAGNOSIS — I129 Hypertensive chronic kidney disease with stage 1 through stage 4 chronic kidney disease, or unspecified chronic kidney disease: Secondary | ICD-10-CM

## 2023-02-14 DIAGNOSIS — G992 Myelopathy in diseases classified elsewhere: Secondary | ICD-10-CM | POA: Diagnosis not present

## 2023-02-14 DIAGNOSIS — M5412 Radiculopathy, cervical region: Secondary | ICD-10-CM | POA: Diagnosis not present

## 2023-02-14 DIAGNOSIS — Z981 Arthrodesis status: Secondary | ICD-10-CM | POA: Diagnosis not present

## 2023-02-14 DIAGNOSIS — Z90710 Acquired absence of both cervix and uterus: Secondary | ICD-10-CM | POA: Diagnosis not present

## 2023-02-14 DIAGNOSIS — M50021 Cervical disc disorder at C4-C5 level with myelopathy: Secondary | ICD-10-CM | POA: Diagnosis not present

## 2023-02-14 HISTORY — PX: ANTERIOR CERVICAL DECOMP/DISCECTOMY FUSION: SHX1161

## 2023-02-14 LAB — GLUCOSE, CAPILLARY
Glucose-Capillary: 158 mg/dL — ABNORMAL HIGH (ref 70–99)
Glucose-Capillary: 112 mg/dL — ABNORMAL HIGH (ref 70–99)
Glucose-Capillary: 149 mg/dL — ABNORMAL HIGH (ref 70–99)
Glucose-Capillary: 156 mg/dL — ABNORMAL HIGH (ref 70–99)
Glucose-Capillary: 191 mg/dL — ABNORMAL HIGH (ref 70–99)

## 2023-02-14 SURGERY — ANTERIOR CERVICAL DECOMPRESSION/DISCECTOMY FUSION 2 LEVELS
Anesthesia: General | Site: Spine Cervical

## 2023-02-14 MED ORDER — ONDANSETRON HCL 4 MG PO TABS: 4.0000 mg | ORAL_TABLET | Freq: Four times a day (QID) | ORAL | Status: DC | PRN

## 2023-02-14 MED ORDER — ACETAMINOPHEN 160 MG/5ML PO SOLN: 1000.0000 mg | Freq: Once | ORAL | Status: DC | PRN

## 2023-02-14 MED ORDER — DEXAMETHASONE SODIUM PHOSPHATE 10 MG/ML IJ SOLN
10.0000 mg | Freq: Once | INTRAMUSCULAR | Status: AC
  Administered 2023-02-14: 10 mg via INTRAVENOUS

## 2023-02-14 MED ORDER — KETAMINE HCL 50 MG/5ML IJ SOSY
PREFILLED_SYRINGE | INTRAMUSCULAR | Status: AC
  Filled 2023-02-14: qty 5

## 2023-02-14 MED ORDER — KETAMINE HCL 10 MG/ML IJ SOLN
INTRAMUSCULAR | Status: DC | PRN
Start: 2023-02-14 — End: 2023-02-14
  Administered 2023-02-14: 20 mg via INTRAVENOUS
  Administered 2023-02-14 (×2): 5 mg via INTRAVENOUS
  Administered 2023-02-14: 10 mg via INTRAVENOUS
  Administered 2023-02-14 (×2): 5 mg via INTRAVENOUS

## 2023-02-14 MED ORDER — HYDROMORPHONE HCL 1 MG/ML IJ SOLN
0.5000 mg | INTRAMUSCULAR | Status: DC | PRN
  Administered 2023-02-14: 0.5 mg via INTRAVENOUS
  Filled 2023-02-14: qty 0.5

## 2023-02-14 MED ORDER — GABAPENTIN 300 MG PO CAPS
900.0000 mg | ORAL_CAPSULE | Freq: Three times a day (TID) | ORAL | Status: DC
  Administered 2023-02-15 – 2023-02-16 (×5): 900 mg via ORAL
  Filled 2023-02-14 (×6): qty 3

## 2023-02-14 MED ORDER — ACETAMINOPHEN 500 MG PO TABS: 1000.0000 mg | ORAL_TABLET | Freq: Once | ORAL | Status: DC | PRN

## 2023-02-14 MED ORDER — OXYCODONE HCL 5 MG PO TABS
ORAL_TABLET | ORAL | Status: AC
  Filled 2023-02-14: qty 1

## 2023-02-14 MED ORDER — ACETAMINOPHEN 10 MG/ML IV SOLN
1000.0000 mg | Freq: Three times a day (TID) | INTRAVENOUS | Status: AC
  Administered 2023-02-14 – 2023-02-15 (×3): 1000 mg via INTRAVENOUS
  Filled 2023-02-14 (×3): qty 100

## 2023-02-14 MED ORDER — INSULIN ASPART 100 UNIT/ML IJ SOLN: 0.0000 [IU] | Freq: Every day | INTRAMUSCULAR | Status: DC

## 2023-02-14 MED ORDER — ONDANSETRON HCL 4 MG/2ML IJ SOLN: 4.0000 mg | Freq: Four times a day (QID) | INTRAMUSCULAR | Status: DC | PRN

## 2023-02-14 MED ORDER — ACETAMINOPHEN 500 MG PO TABS
1000.0000 mg | ORAL_TABLET | Freq: Three times a day (TID) | ORAL | Status: DC
  Filled 2023-02-14: qty 2

## 2023-02-14 MED ORDER — 0.9 % SODIUM CHLORIDE (POUR BTL) OPTIME
TOPICAL | Status: DC | PRN
  Administered 2023-02-14: 1000 mL

## 2023-02-14 MED ORDER — FENTANYL CITRATE (PF) 250 MCG/5ML IJ SOLN
INTRAMUSCULAR | Status: DC | PRN
  Administered 2023-02-14: 50 ug via INTRAVENOUS
  Administered 2023-02-14: 100 ug via INTRAVENOUS
  Administered 2023-02-14 (×2): 50 ug via INTRAVENOUS

## 2023-02-14 MED ORDER — CLINDAMYCIN PHOSPHATE 900 MG/50ML IV SOLN
INTRAVENOUS | Status: AC
  Filled 2023-02-14: qty 50

## 2023-02-14 MED ORDER — ACETAMINOPHEN 500 MG PO TABS
1000.0000 mg | ORAL_TABLET | Freq: Three times a day (TID) | ORAL | Status: DC
  Administered 2023-02-15 – 2023-02-16 (×3): 1000 mg via ORAL
  Filled 2023-02-14 (×3): qty 2

## 2023-02-14 MED ORDER — FENTANYL CITRATE (PF) 100 MCG/2ML IJ SOLN
INTRAMUSCULAR | Status: AC
  Filled 2023-02-14: qty 2

## 2023-02-14 MED ORDER — REMIFENTANIL HCL 1 MG IV SOLR
INTRAVENOUS | Status: DC | PRN
Start: 2023-02-14 — End: 2023-02-14
  Administered 2023-02-14: .03 ug/kg/min via INTRAVENOUS

## 2023-02-14 MED ORDER — PROPOFOL 500 MG/50ML IV EMUL
INTRAVENOUS | Status: DC | PRN
Start: 2023-02-14 — End: 2023-02-14
  Administered 2023-02-14: 125 ug/kg/min via INTRAVENOUS

## 2023-02-14 MED ORDER — VITAMIN D 25 MCG (1000 UNIT) PO TABS
2000.0000 [IU] | ORAL_TABLET | Freq: Every day | ORAL | Status: DC
  Administered 2023-02-15 – 2023-02-16 (×2): 2000 [IU] via ORAL
  Filled 2023-02-14 (×3): qty 2

## 2023-02-14 MED ORDER — TRANEXAMIC ACID-NACL 1000-0.7 MG/100ML-% IV SOLN
1000.0000 mg | INTRAVENOUS | Status: AC
  Administered 2023-02-14: 1000 mg via INTRAVENOUS

## 2023-02-14 MED ORDER — PRAVASTATIN SODIUM 40 MG PO TABS
80.0000 mg | ORAL_TABLET | Freq: Every day | ORAL | Status: DC
  Administered 2023-02-15 – 2023-02-16 (×2): 80 mg via ORAL
  Filled 2023-02-14 (×2): qty 2

## 2023-02-14 MED ORDER — CHLORHEXIDINE GLUCONATE 0.12 % MT SOLN: 15.0000 mL | Freq: Once | OROMUCOSAL | Status: AC

## 2023-02-14 MED ORDER — BACITRACIN ZINC 500 UNIT/GM EX OINT
TOPICAL_OINTMENT | CUTANEOUS | Status: AC
  Filled 2023-02-14: qty 28.35

## 2023-02-14 MED ORDER — THROMBIN (RECOMBINANT) 5000 UNITS EX SOLR
CUTANEOUS | Status: DC | PRN
  Administered 2023-02-14: 5 mL via TOPICAL

## 2023-02-14 MED ORDER — PHENYLEPHRINE HCL-NACL 20-0.9 MG/250ML-% IV SOLN
INTRAVENOUS | Status: DC | PRN
  Administered 2023-02-14: 20 ug/min via INTRAVENOUS

## 2023-02-14 MED ORDER — ONDANSETRON HCL 4 MG/2ML IJ SOLN
INTRAMUSCULAR | Status: DC | PRN
  Administered 2023-02-14: 4 mg via INTRAVENOUS

## 2023-02-14 MED ORDER — SODIUM CHLORIDE 0.9 % IV SOLN
0.0500 ug/kg/min | INTRAVENOUS | Status: DC
  Filled 2023-02-14: qty 5000

## 2023-02-14 MED ORDER — OXYCODONE HCL 5 MG PO TABS
5.0000 mg | ORAL_TABLET | Freq: Once | ORAL | Status: AC | PRN
  Administered 2023-02-14: 5 mg via ORAL

## 2023-02-14 MED ORDER — FENTANYL CITRATE (PF) 100 MCG/2ML IJ SOLN
INTRAMUSCULAR | Status: AC
  Administered 2023-02-14: 50 ug via INTRAVENOUS
  Filled 2023-02-14: qty 2

## 2023-02-14 MED ORDER — ONDANSETRON HCL 4 MG/2ML IJ SOLN: 4.0000 mg | Freq: Once | INTRAMUSCULAR | Status: DC | PRN

## 2023-02-14 MED ORDER — INSULIN ASPART 100 UNIT/ML IJ SOLN
0.0000 [IU] | Freq: Three times a day (TID) | INTRAMUSCULAR | Status: DC
  Administered 2023-02-15: 2 [IU] via SUBCUTANEOUS
  Administered 2023-02-15: 3 [IU] via SUBCUTANEOUS
  Administered 2023-02-15: 5 [IU] via SUBCUTANEOUS
  Administered 2023-02-16: 8 [IU] via SUBCUTANEOUS
  Administered 2023-02-16: 3 [IU] via SUBCUTANEOUS

## 2023-02-14 MED ORDER — LOSARTAN POTASSIUM 50 MG PO TABS
50.0000 mg | ORAL_TABLET | Freq: Every day | ORAL | Status: DC
  Administered 2023-02-16: 50 mg via ORAL
  Filled 2023-02-14: qty 1

## 2023-02-14 MED ORDER — SENNA 8.6 MG PO TABS
1.0000 | ORAL_TABLET | Freq: Two times a day (BID) | ORAL | Status: DC
  Administered 2023-02-15 – 2023-02-16 (×2): 8.6 mg via ORAL
  Filled 2023-02-14 (×4): qty 1

## 2023-02-14 MED ORDER — LIDOCAINE 2% (20 MG/ML) 5 ML SYRINGE
INTRAMUSCULAR | Status: DC | PRN
  Administered 2023-02-14: 100 mg via INTRAVENOUS

## 2023-02-14 MED ORDER — POVIDONE-IODINE 10 % EX SWAB: 2.0000 | Freq: Once | CUTANEOUS | Status: DC

## 2023-02-14 MED ORDER — CLINDAMYCIN PHOSPHATE 900 MG/50ML IV SOLN
900.0000 mg | INTRAVENOUS | Status: AC
  Administered 2023-02-14: 900 mg via INTRAVENOUS

## 2023-02-14 MED ORDER — CHLORHEXIDINE GLUCONATE 0.12 % MT SOLN
OROMUCOSAL | Status: AC
  Administered 2023-02-14: 15 mL via OROMUCOSAL
  Filled 2023-02-14: qty 15

## 2023-02-14 MED ORDER — AMLODIPINE BESYLATE 5 MG PO TABS
5.0000 mg | ORAL_TABLET | Freq: Every day | ORAL | Status: DC
  Administered 2023-02-15 – 2023-02-16 (×2): 5 mg via ORAL
  Filled 2023-02-14 (×2): qty 1

## 2023-02-14 MED ORDER — FENTANYL CITRATE (PF) 250 MCG/5ML IJ SOLN
INTRAMUSCULAR | Status: AC
  Filled 2023-02-14: qty 5

## 2023-02-14 MED ORDER — PANTOPRAZOLE SODIUM 40 MG PO TBEC
40.0000 mg | DELAYED_RELEASE_TABLET | Freq: Every day | ORAL | Status: DC
  Filled 2023-02-14: qty 1

## 2023-02-14 MED ORDER — POLYETHYLENE GLYCOL 3350 17 G PO PACK
17.0000 g | PACK | Freq: Two times a day (BID) | ORAL | Status: DC
  Administered 2023-02-16: 17 g via ORAL
  Filled 2023-02-14 (×4): qty 1

## 2023-02-14 MED ORDER — TRANEXAMIC ACID-NACL 1000-0.7 MG/100ML-% IV SOLN
INTRAVENOUS | Status: AC
  Filled 2023-02-14: qty 100

## 2023-02-14 MED ORDER — EZETIMIBE 10 MG PO TABS
10.0000 mg | ORAL_TABLET | Freq: Every day | ORAL | Status: DC
  Administered 2023-02-15 – 2023-02-16 (×2): 10 mg via ORAL
  Filled 2023-02-14 (×2): qty 1

## 2023-02-14 MED ORDER — ACETAMINOPHEN 10 MG/ML IV SOLN
INTRAVENOUS | Status: AC
  Filled 2023-02-14: qty 100

## 2023-02-14 MED ORDER — HYDROMORPHONE HCL 1 MG/ML IJ SOLN
0.5000 mg | INTRAMUSCULAR | Status: DC | PRN
  Administered 2023-02-15 (×3): 0.5 mg via INTRAVENOUS
  Filled 2023-02-14 (×4): qty 0.5

## 2023-02-14 MED ORDER — ACETAMINOPHEN 10 MG/ML IV SOLN
INTRAVENOUS | Status: DC | PRN
Start: 2023-02-14 — End: 2023-02-14
  Administered 2023-02-14: 1000 mg via INTRAVENOUS

## 2023-02-14 MED ORDER — METHOCARBAMOL 1000 MG/10ML IJ SOLN
500.0000 mg | Freq: Four times a day (QID) | INTRAVENOUS | Status: DC | PRN
  Administered 2023-02-14: 500 mg via INTRAVENOUS
  Filled 2023-02-14: qty 500

## 2023-02-14 MED ORDER — SODIUM CHLORIDE 0.9 % IV SOLN
0.0125 ug/kg/min | Freq: Once | INTRAVENOUS | Status: DC
  Filled 2023-02-14 (×2): qty 2000

## 2023-02-14 MED ORDER — OXYCODONE HCL 5 MG/5ML PO SOLN: 5.0000 mg | Freq: Once | ORAL | Status: AC | PRN

## 2023-02-14 MED ORDER — PHENYLEPHRINE 80 MCG/ML (10ML) SYRINGE FOR IV PUSH (FOR BLOOD PRESSURE SUPPORT)
PREFILLED_SYRINGE | INTRAVENOUS | Status: DC | PRN
  Administered 2023-02-14 (×2): 80 ug via INTRAVENOUS
  Administered 2023-02-14: 160 ug via INTRAVENOUS

## 2023-02-14 MED ORDER — LACTATED RINGERS IV SOLN: INTRAVENOUS | Status: DC

## 2023-02-14 MED ORDER — BACITRACIN ZINC 500 UNIT/GM EX OINT
TOPICAL_OINTMENT | CUTANEOUS | Status: DC | PRN
Start: 2023-02-14 — End: 2023-02-14
  Administered 2023-02-14: 1 via TOPICAL

## 2023-02-14 MED ORDER — TRIAMCINOLONE ACETONIDE 55 MCG/ACT NA AERO: 2.0000 | INHALATION_SPRAY | Freq: Every day | NASAL | Status: DC | PRN

## 2023-02-14 MED ORDER — FENTANYL CITRATE (PF) 100 MCG/2ML IJ SOLN
25.0000 ug | INTRAMUSCULAR | Status: DC | PRN
  Administered 2023-02-14 (×2): 50 ug via INTRAVENOUS

## 2023-02-14 MED ORDER — TRANEXAMIC ACID-NACL 1000-0.7 MG/100ML-% IV SOLN
1000.0000 mg | Freq: Once | INTRAVENOUS | Status: AC
  Administered 2023-02-14: 1000 mg via INTRAVENOUS
  Filled 2023-02-14: qty 100

## 2023-02-14 MED ORDER — DEXAMETHASONE SODIUM PHOSPHATE 10 MG/ML IJ SOLN
INTRAMUSCULAR | Status: AC
  Filled 2023-02-14: qty 1

## 2023-02-14 MED ORDER — OXYCODONE HCL 5 MG PO TABS: 5.0000 mg | ORAL_TABLET | Freq: Once | ORAL | Status: DC | PRN

## 2023-02-14 MED ORDER — OXYCODONE HCL 5 MG/5ML PO SOLN: 5.0000 mg | Freq: Once | ORAL | Status: DC | PRN

## 2023-02-14 MED ORDER — ORAL CARE MOUTH RINSE: 15.0000 mL | Freq: Once | OROMUCOSAL | Status: AC

## 2023-02-14 MED ORDER — CLINDAMYCIN PHOSPHATE 600 MG/50ML IV SOLN
600.0000 mg | Freq: Four times a day (QID) | INTRAVENOUS | Status: AC
  Administered 2023-02-14 – 2023-02-15 (×3): 600 mg via INTRAVENOUS
  Filled 2023-02-14 (×3): qty 50

## 2023-02-14 MED ORDER — ACETAMINOPHEN 10 MG/ML IV SOLN: 1000.0000 mg | Freq: Once | INTRAVENOUS | Status: DC | PRN

## 2023-02-14 MED ORDER — METHOCARBAMOL 500 MG PO TABS
500.0000 mg | ORAL_TABLET | Freq: Four times a day (QID) | ORAL | Status: DC
  Administered 2023-02-15 – 2023-02-16 (×6): 500 mg via ORAL
  Filled 2023-02-14 (×7): qty 1

## 2023-02-14 MED ORDER — FENTANYL CITRATE (PF) 100 MCG/2ML IJ SOLN: 25.0000 ug | INTRAMUSCULAR | Status: DC | PRN

## 2023-02-14 MED ORDER — ONDANSETRON HCL 4 MG/2ML IJ SOLN
INTRAMUSCULAR | Status: AC
  Filled 2023-02-14: qty 2

## 2023-02-14 MED ORDER — OXYCODONE HCL 5 MG PO TABS
5.0000 mg | ORAL_TABLET | ORAL | Status: DC | PRN
  Administered 2023-02-15 – 2023-02-16 (×3): 10 mg via ORAL
  Filled 2023-02-14 (×3): qty 2

## 2023-02-14 MED ORDER — PROPOFOL 10 MG/ML IV BOLUS
INTRAVENOUS | Status: DC | PRN
  Administered 2023-02-14: 25 mg via INTRAVENOUS
  Administered 2023-02-14: 50 mg via INTRAVENOUS
  Administered 2023-02-14: 120 mg via INTRAVENOUS
  Administered 2023-02-14: 25 mg via INTRAVENOUS

## 2023-02-14 MED ORDER — SUCCINYLCHOLINE CHLORIDE 200 MG/10ML IV SOSY
PREFILLED_SYRINGE | INTRAVENOUS | Status: DC | PRN
  Administered 2023-02-14: 140 mg via INTRAVENOUS

## 2023-02-14 MED ORDER — ALBUTEROL SULFATE (2.5 MG/3ML) 0.083% IN NEBU: 2.5000 mg | INHALATION_SOLUTION | Freq: Four times a day (QID) | RESPIRATORY_TRACT | Status: DC | PRN

## 2023-02-14 MED ORDER — THROMBIN 5000 UNITS EX SOLR
CUTANEOUS | Status: AC
  Filled 2023-02-14: qty 10000

## 2023-02-14 MED ORDER — INSULIN ASPART 100 UNIT/ML IJ SOLN: 0.0000 [IU] | INTRAMUSCULAR | Status: DC | PRN

## 2023-02-14 MED ORDER — PANTOPRAZOLE SODIUM 40 MG IV SOLR
40.0000 mg | Freq: Every day | INTRAVENOUS | Status: DC
  Administered 2023-02-14 – 2023-02-15 (×2): 40 mg via INTRAVENOUS
  Filled 2023-02-14 (×2): qty 10

## 2023-02-14 SURGICAL SUPPLY — 79 items
AGENT HMST KT MTR STRL THRMB (HEMOSTASIS)
ALCOHOL 70% 16 OZ (MISCELLANEOUS) ×2 IMPLANT
ALLOGRAFT LORDOTIC CC 7X11X14 (Bone Implant) IMPLANT
ALLOGRAFT TRIAD LORDOTIC CC (Bone Implant) IMPLANT
APL SKNCLS STERI-STRIP NONHPOA (GAUZE/BANDAGES/DRESSINGS) ×1
BAG COUNTER SPONGE SURGICOUNT (BAG) ×2 IMPLANT
BAG SPNG CNTER NS LX DISP (BAG) ×1
BAND INSRT 18 STRL LF DISP RB (MISCELLANEOUS) ×2
BAND RUBBER #18 3X1/16 STRL (MISCELLANEOUS) ×4 IMPLANT
BENZOIN TINCTURE PRP APPL 2/3 (GAUZE/BANDAGES/DRESSINGS) IMPLANT
BLADE CLIPPER SURG (BLADE) IMPLANT
BUR MATCHSTICK NEURO 3.0 LAGG (BURR) ×2 IMPLANT
CABLE BIPOLOR RESECTION CORD (MISCELLANEOUS) ×2 IMPLANT
CANISTER SUCT 3000ML PPV (MISCELLANEOUS) ×2 IMPLANT
CLSR STERI-STRIP ANTIMIC 1/2X4 (GAUZE/BANDAGES/DRESSINGS) ×2 IMPLANT
COVER MAYO STAND STRL (DRAPES) ×4 IMPLANT
COVER SURGICAL LIGHT HANDLE (MISCELLANEOUS) ×4 IMPLANT
DRAIN CHANNEL 15F RND FF W/TCR (WOUND CARE) IMPLANT
DRAIN PENROSE 0.25X18 (DRAIN) IMPLANT
DRAPE C-ARM 42X72 X-RAY (DRAPES) ×2 IMPLANT
DRAPE LAPAROTOMY 100X72X124 (DRAPES) ×2 IMPLANT
DRAPE MICROSCOPE LEICA (MISCELLANEOUS) ×2 IMPLANT
DRAPE MICROSCOPE NONGLARE (MISCELLANEOUS) ×2 IMPLANT
DRAPE POUCH INSTRU U-SHP 10X18 (DRAPES) ×2 IMPLANT
DRAPE SURG 17X11 SM STRL (DRAPES) ×8 IMPLANT
DRAPE SURG 17X23 STRL (DRAPES) ×2 IMPLANT
DRAPE U-SHAPE 47X51 STRL (DRAPES) ×2 IMPLANT
DRAPE UTILITY XL STRL (DRAPES) ×2 IMPLANT
DRSG OPSITE POSTOP 3X4 (GAUZE/BANDAGES/DRESSINGS) ×2 IMPLANT
DURAPREP 26ML APPLICATOR (WOUND CARE) ×2 IMPLANT
ELECT BLADE INSULATED 4IN (ELECTROSURGICAL) ×1
ELECT COATED BLADE 2.86 ST (ELECTRODE) ×2 IMPLANT
ELECT REM PT RETURN 9FT ADLT (ELECTROSURGICAL) ×1
ELECTRODE BLADE INSULATED 4IN (ELECTROSURGICAL) ×2 IMPLANT
ELECTRODE REM PT RTRN 9FT ADLT (ELECTROSURGICAL) ×2 IMPLANT
FEE INTRAOP CADWELL SUPPLY NCS (MISCELLANEOUS) IMPLANT
FEE INTRAOP MONITOR IMPULS NCS (MISCELLANEOUS) IMPLANT
GAUZE SPONGE 4X4 12PLY STRL LF (GAUZE/BANDAGES/DRESSINGS) IMPLANT
GLOVE BIO SURGEON STRL SZ7.5 (GLOVE) ×4 IMPLANT
GLOVE INDICATOR 7.5 STRL GRN (GLOVE) ×2 IMPLANT
GOWN STRL REUS W/ TWL LRG LVL3 (GOWN DISPOSABLE) ×2 IMPLANT
GOWN STRL REUS W/TWL LRG LVL3 (GOWN DISPOSABLE) ×1
GOWN STRL SURGICAL XL XLNG (GOWN DISPOSABLE) ×2 IMPLANT
INTRAOP CADWELL SUPPLY FEE NCS (MISCELLANEOUS) ×1
INTRAOP DISP SUPPLY FEE NCS (MISCELLANEOUS) ×1
INTRAOP MONITOR FEE IMPULS NCS (MISCELLANEOUS) ×1
KIT BASIN OR (CUSTOM PROCEDURE TRAY) ×2 IMPLANT
KIT TURNOVER KIT B (KITS) ×2 IMPLANT
NDL HYPO 22X1.5 SAFETY MO (MISCELLANEOUS) ×2 IMPLANT
NDL SPNL 18GX3.5 QUINCKE PK (NEEDLE) ×2 IMPLANT
NEEDLE HYPO 22X1.5 SAFETY MO (MISCELLANEOUS) ×1 IMPLANT
NEEDLE SPNL 18GX3.5 QUINCKE PK (NEEDLE) ×1 IMPLANT
NS IRRIG 1000ML POUR BTL (IV SOLUTION) ×2 IMPLANT
PACK ORTHO CERVICAL (CUSTOM PROCEDURE TRAY) ×2 IMPLANT
PACK UNIVERSAL I (CUSTOM PROCEDURE TRAY) ×2 IMPLANT
PAD ARMBOARD 7.5X6 YLW CONV (MISCELLANEOUS) ×4 IMPLANT
PATTIES SURGICAL .25X.25 (GAUZE/BANDAGES/DRESSINGS) IMPLANT
PENCIL BUTTON HOLSTER BLD 10FT (ELECTRODE) ×2 IMPLANT
PLATE ACP 1.6X38 2LVL (Plate) IMPLANT
POSITIONER HEAD DONUT 9IN (MISCELLANEOUS) ×2 IMPLANT
RESTRAINT LIMB HOLDER UNIV (RESTRAINTS) ×2 IMPLANT
SCREW ACP 3.5 X 13 S/D VARIA (Screw) ×6 IMPLANT
SCREW ACP 3.5X13 S/D VAR ANGLE (Screw) IMPLANT
SPONGE INTESTINAL PEANUT (DISPOSABLE) ×2 IMPLANT
SPONGE SURGIFOAM ABS GEL SZ50 (HEMOSTASIS) ×2 IMPLANT
STRIP CLOSURE SKIN 1/2X4 (GAUZE/BANDAGES/DRESSINGS) IMPLANT
SURGIFLO W/THROMBIN 8M KIT (HEMOSTASIS) IMPLANT
SUT BONE WAX W31G (SUTURE) ×2 IMPLANT
SUT MNCRL AB 3-0 PS2 27 (SUTURE) ×2 IMPLANT
SUT VIC AB 2-0 CT2 18 VCP726D (SUTURE) ×2 IMPLANT
SYR BULB IRRIG 60ML STRL (SYRINGE) ×2 IMPLANT
TAPE CLOTH 4X10 WHT NS (GAUZE/BANDAGES/DRESSINGS) ×2 IMPLANT
TAPE CLOTH SURG 4X10 WHT LF (GAUZE/BANDAGES/DRESSINGS) IMPLANT
TOWEL GREEN STERILE (TOWEL DISPOSABLE) ×2 IMPLANT
TOWEL GREEN STERILE FF (TOWEL DISPOSABLE) ×2 IMPLANT
TRAY FOLEY W/BAG SLVR 16FR (SET/KITS/TRAYS/PACK) ×1
TRAY FOLEY W/BAG SLVR 16FR ST (SET/KITS/TRAYS/PACK) ×2 IMPLANT
TUBING FEATHERFLOW (TUBING) ×2 IMPLANT
WATER STERILE IRR 1000ML POUR (IV SOLUTION) ×2 IMPLANT

## 2023-02-14 NOTE — H&P (Signed)
Orthopedic Spine Surgery H&P Note  Assessment: Patient is a 72 y.o. female with cervical myeloradiculopathy   Plan: -Out of bed as tolerated, activity as tolerated, no brace -Covered the risks of surgery one more time with the patient and patient elected to proceed with planned surgery -Written consent verified -Hold anticoagulation in anticipation of surgery -Ancef and TXA on all to OR -NPO for procedure -Site marked -To OR when ready  The risks, including but not limited to pseudarthrosis, dysphagia, hematoma, airway compromise, recurrent laryngeal nerve injury, esophageal perforation, durotomy, spinal cord injury, nerve root injury, persistent pain, adjacent segment disease, infection, bleeding, hardware failure, vascular injury, heart attack, death, stroke, fracture, and need for additional procedures were discussed with the patient again today.  ___________________________________________________________________________  Chief Complaint: neck and bilateral shoulder pain  History: Patient is 72 y.o. female who has been previously seen in the office for neck pain that radiates into bilateral shoulders. Over time with observation in the office, she developed mores symptoms consistent with myelopathy in addition to her radicular symptoms so operative management was discussed with her due to the natural history of myelopathy. After covering anterior decompression and fusion in the office, patient elected to proceed. The patient presents today with no changes in her symptoms since the last office visit. See previous office note for further details.    Review of systems: General: denies fevers and chills, myalgias Neurologic: denies recent changes in vision, slurred speech Abdomen: denies nausea, vomiting, hematemesis Respiratory: denies cough, shortness of breath  Past medical history: Hyperlipidemia Hypertension Ulcerative colitis GERD History of stroke Diabetes (last A1c was 6.3 on  02/04/2023) Neuropathy Chronic pain   Allergies: Contrast, aspirin, gadolinium, metformin, tramadol, penicillin, statins   Past surgical history:  Hysterectomy Left knee arthroscopy x 2 Lumbar laminectomy   Social history: Denies use of nicotine product (smoking, vaping, patches, smokeless) Alcohol use: denies Denies recreational drug use  Family history: -reviewed and not pertinent to cervical myeloradiculopathy   Physical Exam:  BMI of 29.7  General: no acute distress, appears stated age Neurologic: alert, answering questions appropriately, following commands Cardiovascular: regular rate, no cyanosis Respiratory: unlabored breathing on room air, symmetric chest rise Psychiatric: appropriate affect, normal cadence to speech   MSK (spine):  -Strength exam                                                   Left                  Right Grip strength               5/5                 5/5 Interosseus                  5/5                  5/5 Wrist extension            5/5                  5/5 Wrist flexion                 5/5                  5/5 Elbow flexion  5/5                5/5 Deltoid                          4+/5                4+/5   EHL                              4/5                  4/5 TA                                 5/5                  5/5 GSC                             5/5                  5/5 Knee extension            5/5                  5/5 Hip flexion                    5/5                  5/5  -Sensory exam    Sensation intact to light touch in L3-S1 nerve distributions of bilateral lower extremities  Sensation intact to light touch in C5-T1 nerve distributions of bilateral upper extremities   Patient name: Tiffany [name] Patient MRN: [id number] Date: 02/14/23

## 2023-02-14 NOTE — Progress Notes (Signed)
Orthopedic Surgery Post-operative Progress Note  Assessment: Patient is a 72 y.o. female who is currently admitted after undergoing C3-5 ACDF   Plan: -Operative plans complete -Needs upright films when able -Drain will be pulled tomorrow morning -Out of bed as tolerated with aspen collar -No bending/lifting/twisting greater than 10 pounds -OT evaluation and treat -Pain control -Diabetic diet -Clindamycin x2 post-operative doses -No antiplatelet or dvt chemoprophylaxis for 72 hours after surgery -Disposition: to floor from PACU  _________________________________________________________________________  Subjective: No acute events since surgery. Recovering in PACU.   Objective:  General: no acute distress, appropriate affect Neurologic: alert, answering questions appropriately, following commands Respiratory: unlabored breathing on room air Neck: no hematoma appreciated, c collar in place Skin: dressing clear/dry/intact  MSK (spine):  -Strength exam      Right  Left Grip strength                5/5  5/5 Interosseus   5/5   5/5 Wrist extension  5/5  5/5 Wrist flexion   5/5  5/5 Elbow flexion   5/5  5/5 Deltoid    4+/5  4+/5  EHL    4/5  4/5 TA    5/5  5/5 GSC    5/5  5/5 Knee extension  5/5  5/5 Hip flexion   5/5  5/5  -Sensory exam    Sensation intact to light touch in L3-S1 nerve distributions of bilateral lower extremities  Sensation intact to light touch in C5-T1 nerve distributions of bilateral upper extremities   Patient name: Tiffany [name] Patient MRN: [id number] Date: 02/14/23

## 2023-02-14 NOTE — Transfer of Care (Signed)
Immediate Anesthesia Transfer of Care Note  Patient: Tiffany [name]  Procedure(s) Performed: CERVICAL THREE-FIVE ANTERIOR CERVICAL DECOMPRESSION/DISCECTOMY FUSION (Spine Cervical)  Patient Location: PACU  Anesthesia Type:General  Level of Consciousness: awake, alert , and oriented  Airway & Oxygen Therapy: Patient Spontanous Breathing and Patient connected to nasal cannula oxygen  Post-op Assessment: Report given to RN, Post -op Vital signs reviewed and stable, and Patient moving all extremities X 4  Post vital signs: Reviewed and stable  Last Vitals:  Vitals Value Taken Time  BP 156/85   Temp    Pulse 87 02/14/23 1810  Resp 12 02/14/23 1810  SpO2 96 % 02/14/23 1810  Vitals shown include unfiled device data.  Last Pain:  Vitals:   02/14/23 1112  TempSrc:   PainSc: 8          Complications: No notable events documented.

## 2023-02-14 NOTE — Progress Notes (Addendum)
Orthopedic Tech Progress Note Patient Details:  Tiffany [name] 1950-11-21 [id number] Delivered Aspen Collar to OR   Post Interventions Patient Tolerated: Well  Tiffany [name] A Tiffany [name] 02/14/2023, 12:52 PM

## 2023-02-14 NOTE — Anesthesia Procedure Notes (Signed)
Procedure Name: Intubation Date/Time: 02/14/2023 12:48 PM  Performed by: Stanton Kidney, CRNAPre-anesthesia Checklist: Patient identified, Patient being monitored, Timeout performed, Emergency Drugs available and Suction available Patient Re-evaluated:Patient Re-evaluated prior to induction Oxygen Delivery Method: Circle system utilized Preoxygenation: Pre-oxygenation with 100% oxygen Induction Type: IV induction Ventilation: Mask ventilation without difficulty Laryngoscope Size: 3 and McGraph Grade View: Grade I Tube type: Oral Tube size: 7.5 mm Number of attempts: 1 Airway Equipment and Method: Stylet Placement Confirmation: ETT inserted through vocal cords under direct vision, positive ETCO2 and breath sounds checked- equal and bilateral Secured at: 22 cm Tube secured with: Tape Dental Injury: Teeth and Oropharynx as per pre-operative assessment  Comments: atraumatic

## 2023-02-14 NOTE — Op Note (Signed)
Orthopedic Spine Surgery Operative Report  Procedure: C3/4 and C4/5 anterior cervical discectomy and fusion C3/4 and C4/5 placement of structural allograft interbody spacer  C3, C4, C5 anterior plate instrumentation Intra-operative use of microscope  Modifier: 22  Date of procedure: 02/14/2023  Patient name: Tiffany [name] MRN: [id number] DOB: 1951/01/24  Surgeon: Willia Craze, MD Assistant: None Pre-operative diagnosis: cervical myeloradiculopathy Post-operative diagnosis: same as above Findings: C3/4 calcified disc that was to the PLL and dura, degenerative disc at C4/5  Specimens: none Anesthesia: general EBL: 50cc Complications: none Pre-incision antibiotic: ancef Pre-incision decadron: 10mg   Implants:  Implant Name Type Inv. Item Serial No. Manufacturer Lot No. LRB No. Used Action  LOG [id number] - [employer] #508 CERVICAL IMPLANT SET - 1          ALLOGRAFT LORDOTIC CC [id number] - [id number] Bone Implant [employer]OGRAFT [employer] CC [id number] [employer]  N/A 1 Implanted  ALLOGRAFT TRIAD [employer] CC - [id number] Bone Implant ALLOGRAFT TRIAD [employer] CC [id number] [employer]  N/A 1 Implanted  PLATE ACP 6.0V37 2LVL - [id number] Plate PLATE ACP 6.2V03 2LVL  [employer]  N/A 1 Implanted  SCREW ACP 3.5 X 13 S/D VARIA - [id number] Screw SCREW ACP 3.5 X 13 S/D VARIA  [employer]  N/A 6 Implanted      Indication for procedure: Patient is a 72 y.o. female who presented to the office with signs and symptoms consistent with cervical myelopathy with concomitant radiculopathy. The natural history of cervical myelopathy was explained to the patient. After this discussion, surgical intervention was recommended. The pre-operative MRI showed stenosis from C3-C5 so a C3-5 anterior cervical discectomy and fusion was presented as a treatment option. The risks, including but not limited to pseudarthrosis, dysphagia, hematoma, airway compromise, recurrent laryngeal nerve injury, esophageal  perforation, durotomy, spinal cord injury, nerve root injury, persistent pain, adjacent segment disease, infection, bleeding, hardware failure, vascular injury, heart attack, death, stroke, fracture, and need for additional procedures were discussed with the patient. The benefit of surgery would be preventing progression of the myelopathy and not to reverse any myelopathic symptoms. Explained that she can expect to get some relief of her radicular type pain, but she may not get full relief of her pain with this surgery, especially any neck pain. The alternatives to surgical management would be continued monitoring, physical therapy, over-the-counter pain medications, ambulatory aids, and activity modification. I reiterated that these modalities would not change the natural history of the disease and that is why operative management has been recommended. All the patient's questions were answered to her satisfaction. After this discussion, the patient expressed understanding and elected to proceed with surgical intervention.    Procedure Description: The patient was met in the pre-operative holding area. The patient's identity and consent were verified. The operative site was marked. The patient's remaining questions about the surgery were answered. The patient was brought back to the operating room. General anesthesia was induced and an endotracheal tube was placed by the anesthesia staff. The patient was transferred to the flat top [place] table in the supine position. All bony prominences were well padded. A bump was placed underneath the patient's shoulders to extend the neck slightly.  Neuromonitoring leads were attached to the patient by the neuromonitoring technologist. Gardner-Wells tongs were attached to the patient's head about 1cm above the pinna in line with the external auditory meatus. Baseline neuromonitoring signals were obtained. 15 pounds of weight was then attached to the tongs. There was no  change in the neuromonitoring  signals after the weight was attached. The patient's shoulders were than taped to the bed to improve the fluoroscopic visualization during the procedure. The surgical area was cleansed with alcohol. Fluoroscopy was then brought in to check rotation on the AP image and to mark the levels on the lateral image. The patient's skin was then prepped and draped in a standard, sterile fashion. A time out was performed that identified the patient, the procedure, and the operative levels. All team members agreed with what was stated in the time out.   A left-sided transverse incision was made in the middle of the previously marked levels. Incision was taken sharply down through the skin and dermis. Electrocautery was used to dissect down to the level of the platysma. A Metzenbaum scissors was used to elevate flaps both cranially and caudally above the platysma. A small opening was made in the platysma with the Metzenbaum scissors. The scissors were then placed under the platysma and electrocautery was used on top of the scissors to split the platysma. Blunt dissection was carried out medial to the sternocleidomastoid to identify the omohyoid. The omohyoid was then dissected over its lateral aspect with the Metzenbaum scissors. An appendiceal retractor was placed around the carotid sheath to retract it laterally and a cloward retractor was placed medially to retractor the esophagus and trachea medially. The interval between these structures was then bluntly dissected with the Metzenbaum scissors. At this point, the prevertebral fascia was visible within the wound. A kittner was used to dissect the prevertebral fascia off the vertebral bodies and discs. A metallic sucker tip was placed over the disc thought to be the correct level. A lateral fluoroscopic shot was then used to confirm the level. Once the correct level was identified, electrocautery was used to mark the disc space. The retractors  that were previously placed were then put back into the wound. Electrocautery was used to elevate the longus coli off the bone on each side. Shadowline retractor blades were then placed under the longus coli on each side and the self-retainer was attached to these retractor blades.  The operative microscope was brought in at this time to improve visualization and lighting. A long handle fifteen blade was then used to incise the C3/4 disc space along the endplates and longitudinally to connect these end plate incisions to create a rectangular incision in the disc. A pituitary was used to remove the incised disc material. A 2 kerrison was used to remove the anterior osteophyte and square the inferior endplate of the cranial vertebra. A combination of straight and curved curettes were used to remove further disc material and the cartilaginous aspect of the endplates. This was done until both the inferior and superior endplates had been prepared from the uncus to uncus and from ventral vertebral body to the posterior osteophytes. A burr was then used to remove the posterior vertebral osteophytes. At this time, there was still disc and PLL posterior to those osteophytes. The disc was calcified and was adherent to the PLL and dura. It took an extra 1hr to slowly and careful elevate the disc and PLL off of the dura and avoid a dural tear due to the calcifications. A combination of a micro nerve hook and 1 kerrison was used to remove this adherent disc and PLL. Eventually the dura was visualized in the base of the wound and no further disc or PLL was seen adherent to the dura. A curved curette was then placed into the foramen and pointed  ventrally. It was used to remove soft tissue and the deep aspect of the uncus. A nerve hook was then easily passed into the foramen. The same process was repeated for the other foramen. Lateral fluoroscopic imaging was used to guide the insertion of trials into the disc space in a serial  fashion, starting with the smallest trial. Once a good fit was obtained, the trial was left in and neuromonitoring signals were checked. There were no changes from baseline. AP and lateral fluoroscopic images were obtained and confirmed satisfactory position of the trial. A 7mm was selected for use as the final implant.  The allograft interbody implant was then placed into the prepared disc space and lightly malleted into place. Neuromonitoring signals were checked again and no changes from baseline were noted. AP and lateral fluoroscopic images was obtained and confirmed satisfactory position of the final implant.  The retractor blades were removed from the wound. Then, the same steps in the above paragraph were repeated for the C4/5 disc space. Except, at this level, there was no calcification or adherent PLL so this took the standard amount of time and the plane between the dura and PLL was more easily developed so a kerrison was used to remove the PLL with relative ease. At this level, a good fit was obtained with a 6mm trial and so a 6mm allograft interbody spacer was placed into the prepared disc space. AP and lateral fluoroscopic images of the entire C spine were obtained which showed satisfactory positioning of all the interbody implants.   The microscope was then moved away from the operative field and the retractor blades were removed from the wound. Anesthesia staff removed the weight from the Gardner-Wells tongs and a repeat neuromonitoring check showed no change from baseline. A cloward was then used to retract the esophagus and trachea away from the ventral vertebral bodies and another was used to retract the carotid and sternocleidomastoid laterally. A plate was then selected and placed over the ventral aspects of the vertebral bodies. No soft tissue structures were underneath the plate. An awl put into the wound and put into one of the screw holes in the plate. It was aimed slightly medially. A  13mm screw was then inserted into the drilled hole. The same steps were repeated with the awl and screw to place the same sized screws in the remaining holes within the plate. Again, the wound was checked and no soft tissue structures were seen underneath the plate.   Final AP and lateral fluoroscopic films were taken and confirmed satisfactory position of the plate and interbody implants. The screws were final tightened. Hemostasis was achieved. A penrose drain was placed into the wound. The platysma was reapproximated using 2-0 vicryl. Neuromonitoring was checked and there were no changes from baseline. Neuromonitoring was discontinued at this time. The deep dermal layer was closed with 2-0 vicryl. The skin was closed with a 3-0 monocryl. All counts were correct at the end of the procedure. Benzoine and steri strips were used over the incision area. The wound was dressed with 4x4 gauze and paper tape. The Gardner-Wells tongs were removed and bacitracin was placed over the skin puncture sites. The patient was awakened from anesthesia and transferred to a bed where a hard cervical collar was applied. The patient was brought back to the post-anesthesia care unit in stable condition by the anesthesia staff.   Post-operative plan: The patient will recover in the post-anesthesia care unit and then go to the floor.  The patient will receive two post-operative doses of clindamycin. The patient will be out of bed as tolerated in a cervical collar. The patient will work with physical therapy. The drain will be removed on post-operative day one. The patient will likely discharge to home tomorrow.    Willia Craze, MD Orthopedic Surgeon   Of note, the C3/4 took additional time due to the calcification and adherence of the PLL to the dura. There were also numerous veins over the dura that obscured the view and took additional time to obtain hemostasis. It took extra effort and time to develop the plane between disc and  the PLL, and the PLL and the dura. Careful use of a nerve hook and 1 kerrison was done to slowly remove the calcified disc off of the PLL. This same process was used to carefully remove the PLL off the dura to avoid a ventral dural tear while obtaining sufficient decompression. This portion of the discectomy usually takes about but ended up taking about 1hour and as small planes were slowly developed and removed with a 1 kerrison. 125% standard fee

## 2023-02-15 ENCOUNTER — Observation Stay (HOSPITAL_COMMUNITY): Payer: Medicare Other

## 2023-02-15 DIAGNOSIS — Z90710 Acquired absence of both cervix and uterus: Secondary | ICD-10-CM | POA: Diagnosis not present

## 2023-02-15 DIAGNOSIS — E785 Hyperlipidemia, unspecified: Secondary | ICD-10-CM | POA: Diagnosis present

## 2023-02-15 DIAGNOSIS — Z8673 Personal history of transient ischemic attack (TIA), and cerebral infarction without residual deficits: Secondary | ICD-10-CM | POA: Diagnosis not present

## 2023-02-15 DIAGNOSIS — M5030 Other cervical disc degeneration, unspecified cervical region: Secondary | ICD-10-CM | POA: Diagnosis not present

## 2023-02-15 DIAGNOSIS — E1142 Type 2 diabetes mellitus with diabetic polyneuropathy: Secondary | ICD-10-CM | POA: Diagnosis present

## 2023-02-15 DIAGNOSIS — E871 Hypo-osmolality and hyponatremia: Secondary | ICD-10-CM | POA: Diagnosis present

## 2023-02-15 DIAGNOSIS — Z981 Arthrodesis status: Secondary | ICD-10-CM | POA: Diagnosis not present

## 2023-02-15 DIAGNOSIS — M5412 Radiculopathy, cervical region: Secondary | ICD-10-CM | POA: Diagnosis present

## 2023-02-15 DIAGNOSIS — M2578 Osteophyte, vertebrae: Secondary | ICD-10-CM | POA: Diagnosis present

## 2023-02-15 DIAGNOSIS — G992 Myelopathy in diseases classified elsewhere: Secondary | ICD-10-CM | POA: Diagnosis present

## 2023-02-15 DIAGNOSIS — I10 Essential (primary) hypertension: Secondary | ICD-10-CM | POA: Diagnosis present

## 2023-02-15 DIAGNOSIS — G8929 Other chronic pain: Secondary | ICD-10-CM | POA: Diagnosis present

## 2023-02-15 DIAGNOSIS — K219 Gastro-esophageal reflux disease without esophagitis: Secondary | ICD-10-CM | POA: Diagnosis present

## 2023-02-15 LAB — BASIC METABOLIC PANEL
Anion gap: 9 (ref 5–15)
BUN: 16 mg/dL (ref 8–23)
CO2: 18 mmol/L — ABNORMAL LOW (ref 22–32)
Calcium: 8.8 mg/dL — ABNORMAL LOW (ref 8.9–10.3)
Chloride: 104 mmol/L (ref 98–111)
Creatinine, Ser: 0.75 mg/dL (ref 0.44–1.00)
GFR, Estimated: >60 mL/min (ref >60)
Glucose, Bld: 207 mg/dL — ABNORMAL HIGH (ref 70–99)
Potassium: 4.5 mmol/L (ref 3.5–5.1)
Sodium: 131 mmol/L — ABNORMAL LOW (ref 135–145)

## 2023-02-15 LAB — CBC
HCT: 38.5 % (ref 36.0–46.0)
Hemoglobin: 12.6 g/dL (ref 12.0–15.0)
MCH: 28.4 pg (ref 26.0–34.0)
MCHC: 32.7 g/dL (ref 30.0–36.0)
MCV: 86.7 fL (ref 80.0–100.0)
Platelets: 211 10*3/uL (ref 150–400)
RBC: 4.44 MIL/uL (ref 3.87–5.11)
RDW: 14 % (ref 11.5–15.5)
WBC: 5 10*3/uL (ref 4.0–10.5)
nRBC: 0 % (ref 0.0–0.2)

## 2023-02-15 LAB — GLUCOSE, CAPILLARY
Glucose-Capillary: 187 mg/dL — ABNORMAL HIGH (ref 70–99)
Glucose-Capillary: 245 mg/dL — ABNORMAL HIGH (ref 70–99)
Glucose-Capillary: 132 mg/dL — ABNORMAL HIGH (ref 70–99)
Glucose-Capillary: 179 mg/dL — ABNORMAL HIGH (ref 70–99)

## 2023-02-15 MED ORDER — OXYCODONE HCL 5 MG PO TABS: 5.0000 mg | ORAL_TABLET | ORAL | 0 refills | Status: DC | PRN

## 2023-02-15 MED ORDER — ONDANSETRON HCL 4 MG PO TABS: 4.0000 mg | ORAL_TABLET | Freq: Four times a day (QID) | ORAL | 0 refills | Status: DC | PRN

## 2023-02-15 MED ORDER — SENNA 8.6 MG PO TABS: 1.0000 | ORAL_TABLET | Freq: Two times a day (BID) | ORAL | 0 refills | Status: AC

## 2023-02-15 MED ORDER — POLYETHYLENE GLYCOL 3350 17 G PO PACK: 17.0000 g | PACK | Freq: Every day | ORAL | 0 refills | Status: AC

## 2023-02-15 MED ORDER — ACETAMINOPHEN 500 MG PO TABS: 1000.0000 mg | ORAL_TABLET | Freq: Three times a day (TID) | ORAL | 0 refills | Status: AC

## 2023-02-15 MED ORDER — OXYCODONE HCL 5 MG PO TABS: 5.0000 mg | ORAL_TABLET | ORAL | 0 refills | Status: AC | PRN

## 2023-02-15 MED ORDER — INSULIN DETEMIR 100 UNIT/ML ~~LOC~~ SOLN
4.0000 [IU] | Freq: Two times a day (BID) | SUBCUTANEOUS | Status: DC
  Administered 2023-02-15 – 2023-02-16 (×3): 4 [IU] via SUBCUTANEOUS
  Filled 2023-02-15 (×4): qty 0.04

## 2023-02-15 MED ORDER — SODIUM CHLORIDE 1 G PO TABS
1.0000 g | ORAL_TABLET | Freq: Once | ORAL | Status: AC
  Administered 2023-02-15: 1 g via ORAL
  Filled 2023-02-15: qty 1

## 2023-02-15 MED ORDER — METHOCARBAMOL 500 MG PO TABS: 500.0000 mg | ORAL_TABLET | Freq: Four times a day (QID) | ORAL | 0 refills | Status: AC

## 2023-02-15 NOTE — Plan of Care (Signed)
  Problem: Activity: Goal: Risk for activity intolerance will decrease Outcome: Progressing   Problem: Nutrition: Goal: Adequate nutrition will be maintained Outcome: Progressing   Problem: Pain Managment: Goal: General experience of comfort will improve Outcome: Progressing   

## 2023-02-15 NOTE — Plan of Care (Signed)
  Problem: Health Behavior/Discharge Planning: Goal: Ability to manage health-related needs will improve Outcome: Progressing   Problem: Activity: Goal: Risk for activity intolerance will decrease Outcome: Progressing   Problem: Coping: Goal: Level of anxiety will decrease Outcome: Progressing   

## 2023-02-15 NOTE — Progress Notes (Signed)
Mobility Specialist Progress Note:   02/15/23 1400  Mobility  Activity Ambulated with assistance in hallway  Level of Assistance Contact guard assist, steadying assist  Assistive Device Front wheel walker  Distance Ambulated (ft) 300 ft  Activity Response Tolerated well  Mobility Referral Yes  $Mobility charge 1 Mobility  Mobility Specialist Start Time (ACUTE ONLY) 1416  Mobility Specialist Stop Time (ACUTE ONLY) 1430  Mobility Specialist Time Calculation (min) (ACUTE ONLY) 14 min   Pt received in bed, agreeable to mobility. MinG for bed mobility, while following neck/back precautions. C/o some bilateral hip and low back pain. Took x1 standing rest break. Pt left in bed with call bell and all needs met.  D'Vante Earlene Plater Mobility Specialist Please contact via Special educational needs teacher or Rehab office at [phone]

## 2023-02-15 NOTE — Evaluation (Signed)
Occupational Therapy Evaluation Patient Details Name: Tiffany [name] MRN: [id number] DOB: 06/30/1950 Today's Date: 02/15/2023   History of Present Illness Patient is a 72 yo female presenting to the hospital for ACDF of C3-C5 on 02/14/23. PMH includes: HLD, HTN, GERD, CVA, DM   Clinical Impression   Prior to this admission, patient living alone, independent in ADLs and functional mobility, with son and daughter in law assisting with IADLs. Patient no longer drove. Currently, patient presenting with minimal lethargy and confusion, need for increased cues to recall precautions, and need for contact gaurd assist for ADLs and functional mobility. Patient's family is able to provide assist for patient initially upon discharge home. Anticipate no need for further OT intervention at discharge. OT will continue to follow acutely.       If plan is discharge home, recommend the following: A little help with walking and/or transfers;A little help with bathing/dressing/bathroom;Direct supervision/assist for medications management;Direct supervision/assist for financial management;Assist for transportation;Supervision due to cognitive status (initially)    Functional Status Assessment  Patient has had a recent decline in their functional status and demonstrates the ability to make significant improvements in function in a reasonable and predictable amount of time.  Equipment Recommendations  None recommended by OT (patient has DME needed)    Recommendations for Other Services       Precautions / Restrictions Precautions Precautions: Cervical Precaution Booklet Issued: Yes (comment) Precaution Comments: Reviewed with patient Required Braces or Orthoses: Cervical Brace Cervical Brace: Hard collar;At all times (except for showers) Restrictions Weight Bearing Restrictions: No      Mobility Bed Mobility Overal bed mobility: Needs Assistance Bed Mobility: Sidelying to Sit, Rolling Rolling:  Supervision Sidelying to sit: Supervision            Transfers Overall transfer level: Needs assistance Equipment used: None Transfers: Sit to/from Stand Sit to Stand: Contact guard assist           General transfer comment: close CGA, but able to complete without AD      Balance Overall balance assessment: Mild deficits observed, not formally tested                                         ADL either performed or assessed with clinical judgement   ADL Overall ADL's : Needs assistance/impaired Eating/Feeding: Set up;Sitting   Grooming: Set up;Standing;Wash/dry face;Wash/dry hands   Upper Body Bathing: Set up;Sitting   Lower Body Bathing: Minimal assistance;Sit to/from stand;Sitting/lateral leans   Upper Body Dressing : Set up;Sitting   Lower Body Dressing: Minimal assistance;Sit to/from stand;Sitting/lateral leans   Toilet Transfer: Minimal assistance;Ambulation   Toileting- Clothing Manipulation and Hygiene: Contact guard assist;Sit to/from stand;Sitting/lateral lean       Functional mobility during ADLs: Contact guard assist;Cueing for sequencing;Cueing for safety General ADL Comments: Patient presenting with minimal lethargy and confusion, need for increased cues to recall precautions, and need for contact gaurd assist for ADLs and functional mobility. Patient's family is able to provide assist for patient initially upon discharge home. Anticipate no need for further OT intervention at discharge. OT will continue to follow acutely.     Vision Baseline Vision/History: 1 Wears glasses Ability to See in Adequate Light: 0 Adequate Patient Visual Report: No change from baseline Vision Assessment?: No apparent visual deficits     Perception Perception: Not tested       Praxis Praxis: Not  tested       Pertinent Vitals/Pain Pain Assessment Pain Assessment: Faces Faces Pain Scale: Hurts little more Pain Location: neck Pain Descriptors /  Indicators: Grimacing, Guarding Pain Intervention(s): Limited activity within patient's tolerance, Monitored during session, Premedicated before session     Extremity/Trunk Assessment Upper Extremity Assessment Upper Extremity Assessment: Generalized weakness;Right hand dominant   Lower Extremity Assessment Lower Extremity Assessment: Defer to PT evaluation   Cervical / Trunk Assessment Cervical / Trunk Assessment: Neck Surgery   Communication Communication Communication: No apparent difficulties Cueing Techniques: Verbal cues;Gestural cues   Cognition Arousal: Lethargic Behavior During Therapy: Flat affect Overall Cognitive Status: Impaired/Different from baseline Area of Impairment: Attention, Memory, Following commands, Safety/judgement, Awareness                   Current Attention Level: Sustained Memory: Decreased recall of precautions, Decreased short-term memory Following Commands: Follows one step commands with increased time Safety/Judgement: Decreased awareness of safety, Decreased awareness of deficits Awareness: Emergent   General Comments: Patient apparently does not react well to pain medications per son's report (present for evaluation) patient with need for redirection throughout evaluation however was appropriate     General Comments       Exercises     Shoulder Instructions      Home Living Family/patient expects to be discharged to:: Private residence Living Arrangements: Alone Available Help at Discharge: Family;Available 24 hours/day Type of Home: Apartment Home Access:  Michela Pitcher floor)     Home Layout: One level     Bathroom Shower/Tub: Walk-in shower (small threshold)   Bathroom Toilet: Standard Bathroom Accessibility: Yes   Home Equipment: Agricultural consultant (2 wheels);Cane - single point;Shower seat - built in          Prior Functioning/Environment Prior Level of Function : Independent/Modified Independent              Mobility Comments: independent ADLs Comments: independent, family assists with IADLs        OT Problem List: Decreased strength;Decreased activity tolerance;Decreased range of motion;Decreased cognition;Decreased safety awareness;Decreased knowledge of precautions;Pain      OT Treatment/Interventions: Self-care/ADL training;Therapeutic exercise;Energy conservation;DME and/or AE instruction;Therapeutic activities;Patient/family education    OT Goals(Current goals can be found in the care plan section) Acute Rehab OT Goals Patient Stated Goal: to get better OT Goal Formulation: With patient Time For Goal Achievement: 03/01/23 Potential to Achieve Goals: Good  OT Frequency: Min 1X/week    Co-evaluation              AM-PAC OT "6 Clicks" Daily Activity     Outcome Measure Help from another person eating meals?: A Little Help from another person taking care of personal grooming?: A Little Help from another person toileting, which includes using toliet, bedpan, or urinal?: A Little Help from another person bathing (including washing, rinsing, drying)?: A Little Help from another person to put on and taking off regular upper body clothing?: A Little Help from another person to put on and taking off regular lower body clothing?: A Little 6 Click Score: 18   End of Session Nurse Communication: Mobility status  Activity Tolerance: Patient tolerated treatment well Patient left: in chair;with call bell/phone within reach;with family/visitor present  OT Visit Diagnosis: Muscle weakness (generalized) (M62.81);Pain;Other symptoms and signs involving cognitive function                Time: 1610-9604 OT Time Calculation (min): 30 min Charges:  OT General Charges $OT Visit: 1 Visit OT  Evaluation $OT Eval Moderate Complexity: 1 Mod OT Treatments $Self Care/Home Management : 8-22 mins  Pollyann Glen E. Kathee Tumlin, OTR/L Acute Rehabilitation Services [phone]   Cherlyn Cushing 02/15/2023, 3:44 PM

## 2023-02-15 NOTE — Care Management Obs Status (Signed)
MEDICARE OBSERVATION STATUS NOTIFICATION   Patient Details  Name: Tiffany [name] MRN: [id number] Date of Birth: [birth year]   Medicare Observation Status Notification Given:  Yes    Ronny Bacon, RN 02/15/2023, 3:51 PM

## 2023-02-15 NOTE — Discharge Instructions (Signed)
Orthopedic Surgery Discharge Instructions  Patient name: Tiffany [name] Procedure Performed: C3-5 anterior cervical discectomy and fusion Date of Surgery: 02/14/2023 Surgeon: Willia Craze, MD  Pre-operative Diagnosis: cervical myeloradiculopathy Post-operative Diagnosis: same as above  Discharge Date: 02/16/2023 Discharged to: home Discharge Condition: stable  Activity: You should wear your cervical collar for six weeks after surgery. It is okay to remove the cervical collar when taking a shower, but you should have it on at all other times. You should refrain from bending, lifting, or twisting with objects greater than ten pounds until three months after surgery. You are encouraged to walk as much as desired. You can perform household activities such as cleaning dishes, doing laundry, vacuuming, etc. as long as the ten-pound restriction is followed.  Incision Care: Your incision site has a dressing over it. That dressing should remain in place and dry at all times for a total of one week after surgery. After one week, you can remove the dressing. Underneath the dressing, you will find pieces of tape. You should leave these pieces of tape in place. They will fall off with time. Do not pick, rub, or scrub at them. Do not put cream or lotion over the surgical area. After one week and once the dressing is off, it is okay to let soap and water run over your incision. Again, do not pick, scrub, or rub at the pieces of tape when bathing. Do not submerge (e.g., take a bath, swim, go in a hot tub, etc.) until six weeks after surgery. There may be some bloody drainage from the incision into the dressing after surgery. This is normal. You do not need to replace the dressing. Continue to leave it in place for the one week as instructed above. Should the dressing become saturated with blood or drainage, please call the office for further instructions.   Medications: You have been prescribed oxycodone. This is  a narcotic pain medication and should only be taken as prescribed. You should not drink alcohol or operate heavy machinery (including driving) while taking this medication. The oxycodone can cause constipation as a side effect. For that reason, you have been prescribed senna and miralax. These are both laxatives. You do not need to take this medication if you develop diarrhea. Should you remain constipated even while taking these medications, please increase the dose of miralax to twice daily. Tylenol has been prescribed to be taken every 8 hours, which will give you additional pain relief. Robaxin is a muscle relaxer that has been prescribed to you for muscle spasm type pain. Take this medication as needed. Zofran is an antinausea medication that you can take if you develop nausea.Chloraseptic has been prescribed as well for sore throat. Spray this into your mouth and throat as needed to soothe the throat. Follow the instructions on the bottle.  Do not take NSAIDs (ibuprofen, Aleve, Celebrex, naproxen, meloxicam, etc.) for the first 6 weeks after surgery as there is some evidence that their use may decrease the chances of successful fusion.   In order to set expectations for opioid prescriptions, you will only be prescribed opioids for a total of six weeks after surgery and, at two-weeks after surgery, your opioid prescription will start to tapered (decreased dosage and number of pills). If you have ongoing need for opioid medication six weeks after surgery, you will be referred to pain management. If you are already established with a provider that is giving you opioid medications, you should schedule an appointment with  them for six weeks after surgery if you feel you are going to need another prescription. State law only allows for opioid prescriptions one week at a time. If you are running out of opioid medication near the end of the week, please call the office during business hours before running out so I  can send you another prescription.   You may resume any home blood thinners (aspirin, warfarin, lovenox, apixaban, plavix, xarelto, etc.) 72 hours after your surgery. Take these medications as they were previously prescribed.   Driving: You should not drive while taking narcotic pain medications. You should also not drive if you feel you cannot turn your body enough to check your blind spots while in your cervical collar. In this case, you should wait six weeks to drive when your cervical collar will be discontinued. You should start getting back to driving slowly and you may want to try driving in a parking lot before doing anything more. Please be mindful that in some states it is illegal to drive with a cervical collar in place. You should check your local laws before driving and you may have to wait until the collar is discontinued before driving.  Diet: You are safe to resume your regular diet. A common complication after cervical spine surgery is trouble swallowing especially if the incision was made over the front part of your neck. This complication happens often and, the vast majority of the time, it is a temporary problem that gets better with time. The first few days after surgery, you should take more bites than normal to break the food into smaller pieces before swallowing. With time as the swallowing becomes easier, you can gradually get back to taking bites like you normally would.   Reasons to Call the Office After Surgery: You should feel free to call the office with any concerns or questions you have in the post-operative period, but you should definitely notify the office if you develop: -shortness of breath, chest pain, or trouble breathing -excessive bleeding, drainage, redness, or swelling around the surgical site -fevers, chills, or pain that is getting worse with each passing day -persistent nausea or vomiting -new weakness in any extremity, new or worsening numbness or tingling  in any extremity -numbness in the groin, bowel or bladder incontinence -other concerns about your surgery  Follow Up Appointments: You should have an office appointment scheduled for approximately two weeks after surgery. If you do not remember when this appointment is or do not already have it scheduled, please call the office to schedule.   Office Information:  -Willia Craze, MD -Phone number: [phone] -Address: [address]       [place], [place] [address]

## 2023-02-15 NOTE — Anesthesia Postprocedure Evaluation (Signed)
Anesthesia Post Note  Patient: Tiffany [name]  Procedure(s) Performed: CERVICAL THREE-FIVE ANTERIOR CERVICAL DECOMPRESSION/DISCECTOMY FUSION (Spine Cervical)     Patient location during evaluation: PACU Anesthesia Type: General Level of consciousness: awake and alert Pain management: pain level controlled Vital Signs Assessment: post-procedure vital signs reviewed and stable Respiratory status: spontaneous breathing, nonlabored ventilation, respiratory function stable and patient connected to nasal cannula oxygen Cardiovascular status: blood pressure returned to baseline and stable Postop Assessment: no apparent nausea or vomiting Anesthetic complications: no   No notable events documented.               Shelton Silvas

## 2023-02-15 NOTE — Progress Notes (Signed)
Orthopedic Surgery Post-operative Progress Note  Assessment: Patient is a 72 y.o. female who is currently admitted after undergoing C3-5 ACDF   Plan: -Operative plans complete -Needs upright films when able -Drain removed this morning -Out of bed as tolerated with aspen collar -No bending/lifting/twisting greater than 10 pounds -OT evaluation and treat -Pain control -Diabetic diet -Clindamycin x2 post-operative doses -No antiplatelet or dvt chemoprophylaxis for 72 hours after surgery -Disposition: remain floor status  Hyponatremia -Na of 131 this morning -oral supplementation today  _________________________________________________________________________  Subjective: No acute events overnight. Had a lot of pain last night but it is better controlled this morning. Having pain in her neck. Arm pain has improved since surgery.    Objective:  General: no acute distress, appropriate affect Neurologic: alert, answering questions appropriately, following commands Respiratory: unlabored breathing on room air Neck: no hematoma appreciated, c collar in place Skin: dressing clear/dry/intact  MSK (spine):  -Strength exam      Right  Left Grip strength                5/5  5/5 Interosseus   5/5   5/5 Wrist extension  5/5  5/5 Wrist flexion   5/5  5/5 Elbow flexion   5/5  5/5 Deltoid    4+/5  4+/5  EHL    4/5  4/5 TA    5/5  5/5 GSC    5/5  5/5 Knee extension  5/5  5/5 Hip flexion   5/5  5/5  -Sensory exam    Sensation intact to light touch in L3-S1 nerve distributions of bilateral lower extremities  Sensation intact to light touch in C5-T1 nerve distributions of bilateral upper extremities   Patient name: Tiffany [name] Patient MRN: [id number] Date: 02/15/23

## 2023-02-15 NOTE — Discharge Summary (Signed)
Orthopedic Surgery Discharge Summary  Patient name: Tiffany [name] Patient MRN: [id number] Admit today: 02/14/2023 Discharge date: 02/16/2023  Attending physician: Willia Craze, MD Final diagnosis: cervical myeloradiculopathy Findings: C3/4 calcified disc that was to the PLL and dura, degenerative disc at [facility] course: Patient is a 72 y.o. female who was admitted after undergoing C3-5 ACDF. The patient had significant pain immediately after surgery, but pain eventually was controlled with a multimodal regimen including oxycodone. Labs during the hospitalization revealed hyponatremia for which oral supplementation was given. The patient worked with physical therapy who recommended discharge to home. The patient was tolerating an oral diet without issue and was voiding spontaneously after surgery. The patient's vitals were stable on the day of discharge. The patient's drain was removed on POD#1. The patient was medically ready for discharge and was discharge to home on post-operative day 2.  Instructions:   Orthopedic Surgery Discharge Instructions  Patient name: Tiffany [name] Procedure Performed: C3-5 anterior cervical discectomy and fusion Date of Surgery: 02/14/2023 Surgeon: Willia Craze, MD  Pre-operative Diagnosis: cervical myeloradiculopathy Post-operative Diagnosis: same as above  Discharge Date: 02/16/2023 Discharged to: home Discharge Condition: stable  Activity: You should wear your cervical collar for six weeks after surgery. It is okay to remove the cervical collar when taking a shower, but you should have it on at all other times. You should refrain from bending, lifting, or twisting with objects greater than ten pounds until three months after surgery. You are encouraged to walk as much as desired. You can perform household activities such as cleaning dishes, doing laundry, vacuuming, etc. as long as the ten-pound restriction is followed.  Incision Care: Your  incision site has a dressing over it. That dressing should remain in place and dry at all times for a total of one week after surgery. After one week, you can remove the dressing. Underneath the dressing, you will find pieces of tape. You should leave these pieces of tape in place. They will fall off with time. Do not pick, rub, or scrub at them. Do not put cream or lotion over the surgical area. After one week and once the dressing is off, it is okay to let soap and water run over your incision. Again, do not pick, scrub, or rub at the pieces of tape when bathing. Do not submerge (e.g., take a bath, swim, go in a hot tub, etc.) until six weeks after surgery. There may be some bloody drainage from the incision into the dressing after surgery. This is normal. You do not need to replace the dressing. Continue to leave it in place for the one week as instructed above. Should the dressing become saturated with blood or drainage, please call the office for further instructions.   Medications: You have been prescribed oxycodone. This is a narcotic pain medication and should only be taken as prescribed. You should not drink alcohol or operate heavy machinery (including driving) while taking this medication. The oxycodone can cause constipation as a side effect. For that reason, you have been prescribed senna and miralax. These are both laxatives. You do not need to take this medication if you develop diarrhea. Should you remain constipated even while taking these medications, please increase the dose of miralax to twice daily. Tylenol has been prescribed to be taken every 8 hours, which will give you additional pain relief. Robaxin is a muscle relaxer that has been prescribed to you for muscle spasm type pain. Take this medication as  needed. Zofran is an antinausea medication that you can take if you develop nausea.   Do not take NSAIDs (ibuprofen, Aleve, Celebrex, naproxen, meloxicam, etc.) for the first 6 weeks after  surgery as there is some evidence that their use may decrease the chances of successful fusion.   In order to set expectations for opioid prescriptions, you will only be prescribed opioids for a total of six weeks after surgery and, at two-weeks after surgery, your opioid prescription will start to tapered (decreased dosage and number of pills). If you have ongoing need for opioid medication six weeks after surgery, you will be referred to pain management. If you are already established with a provider that is giving you opioid medications, you should schedule an appointment with them for six weeks after surgery if you feel you are going to need another prescription. State law only allows for opioid prescriptions one week at a time. If you are running out of opioid medication near the end of the week, please call the office during business hours before running out so I can send you another prescription.   You may resume any home blood thinners (aspirin, warfarin, lovenox, apixaban, plavix, xarelto, etc.) 72 hours after your surgery. Take these medications as they were previously prescribed.   Driving: You should not drive while taking narcotic pain medications. You should also not drive if you feel you cannot turn your body enough to check your blind spots while in your cervical collar. In this case, you should wait six weeks to drive when your cervical collar will be discontinued. You should start getting back to driving slowly and you may want to try driving in a parking lot before doing anything more. Please be mindful that in some states it is illegal to drive with a cervical collar in place. You should check your local laws before driving and you may have to wait until the collar is discontinued before driving.  Diet: You are safe to resume your regular diet. A common complication after cervical spine surgery is trouble swallowing especially if the incision was made over the front part of your neck.  This complication happens often and, the vast majority of the time, it is a temporary problem that gets better with time. The first few days after surgery, you should take more bites than normal to break the food into smaller pieces before swallowing. With time as the swallowing becomes easier, you can gradually get back to taking bites like you normally would.   Reasons to Call the Office After Surgery: You should feel free to call the office with any concerns or questions you have in the post-operative period, but you should definitely notify the office if you develop: -shortness of breath, chest pain, or trouble breathing -excessive bleeding, drainage, redness, or swelling around the surgical site -fevers, chills, or pain that is getting worse with each passing day -persistent nausea or vomiting -new weakness in any extremity, new or worsening numbness or tingling in any extremity -numbness in the groin, bowel or bladder incontinence -other concerns about your surgery  Follow Up Appointments: You should have an office appointment scheduled for approximately two weeks after surgery. If you do not remember when this appointment is or do not already have it scheduled, please call the office to schedule.   Office Information:  -Willia Craze, MD -Phone number: [phone] -Address: [address]       [place], [place] [address]

## 2023-02-16 LAB — GLUCOSE, CAPILLARY
Glucose-Capillary: 151 mg/dL — ABNORMAL HIGH (ref 70–99)
Glucose-Capillary: 293 mg/dL — ABNORMAL HIGH (ref 70–99)

## 2023-02-16 MED ORDER — DEXAMETHASONE SODIUM PHOSPHATE 10 MG/ML IJ SOLN
10.0000 mg | Freq: Once | INTRAMUSCULAR | Status: AC
  Administered 2023-02-16: 10 mg via INTRAVENOUS
  Filled 2023-02-16: qty 1

## 2023-02-16 MED ORDER — CHLORASEPTIC 1.4 % MT LIQD: 1.0000 | OROMUCOSAL | 0 refills | Status: AC | PRN

## 2023-02-16 NOTE — Progress Notes (Signed)
AVS reviewed.  Patient excited for discharge.  All questions answered.  Patient waiting for ride to home.

## 2023-02-16 NOTE — Progress Notes (Signed)
Mobility Specialist Progress Note:   02/16/23 1500  Mobility  Activity Ambulated with assistance in hallway  Level of Assistance Modified independent, requires aide device or extra time  Assistive Device Cane  Distance Ambulated (ft) 550 ft  Activity Response Tolerated well  Mobility Referral Yes  $Mobility charge 1 Mobility  Mobility Specialist Start Time (ACUTE ONLY) 1503  Mobility Specialist Stop Time (ACUTE ONLY) 1515  Mobility Specialist Time Calculation (min) (ACUTE ONLY) 12 min    Received pt in chair having no complaints and agreeable to mobility. Pt was asymptomatic throughout ambulation and returned to room w/o fault. Left in chair w/ call bell in reach and all needs met.   D'Vante Earlene Plater Mobility Specialist Please contact via Special educational needs teacher or Rehab office at [phone]

## 2023-02-16 NOTE — Progress Notes (Signed)
Orthopedic Surgery Post-operative Progress Note  Assessment: Patient is a 72 y.o. female who is currently admitted after undergoing C3-5 ACDF   Plan: -Operative plans complete -Out of bed as tolerated with aspen collar -No bending/lifting/twisting greater than 10 pounds -OT evaluation and treat -Pain control -Diabetic diet -No antiplatelet or dvt chemoprophylaxis for 72 hours after surgery -Will give a dose of decadron for her sore throat and will prescribe chloraseptic at d/c -Discharge to home today  _________________________________________________________________________  Subjective: No acute events overnight. Having a lot of neck pain but it is controlled with current medications. Arm pain has improved. Has been having a sore throat. Tolerating softer foods. Said she had salmon and potatoes last night but could not eat a cheeseburger.    Objective:  General: no acute distress, appropriate affect Neurologic: alert, answering questions appropriately, following commands Respiratory: unlabored breathing on room air Neck: no hematoma appreciated, c collar in place Skin: dressing clear/dry/intact  MSK (spine):  -Strength exam      Right  Left Grip strength                5/5  5/5 Interosseus   5/5   5/5 Wrist extension  5/5  5/5 Wrist flexion   5/5  5/5 Elbow flexion   5/5  5/5 Deltoid    4+/5  4+/5  EHL    4/5  4/5 TA    5/5  5/5 GSC    5/5  5/5 Knee extension  5/5  5/5 Hip flexion   5/5  5/5  -Sensory exam    Sensation intact to light touch in L3-S1 nerve distributions of bilateral lower extremities  Sensation intact to light touch in C5-T1 nerve distributions of bilateral upper extremities   Patient name: Tiffany [name] Patient MRN: [id number] Date: 02/16/23

## 2023-02-16 NOTE — Progress Notes (Signed)
Occupational Therapy Treatment Patient Details Name: Tiffany [name] MRN: [id number] DOB: Sep 11, 1950 Today's Date: 02/16/2023   History of present illness Patient is a 72 yo female presenting to the hospital for ACDF of C3-C5 on 02/14/23. PMH includes: HLD, HTN, GERD, CVA, DM   OT comments  Pt is making good progress towards their acute OT goals. Overall she was able to complete bed mobility, transfers and mobility with RW with generalized superivsion A. Reviewed LB ADLs and compensatory techniques, and pt was able to full dress, toilet and groom with superivsion + cues for cervical precautions. Educated pt on how to wash the pads on her collar and apply the new pads as well as how to don/doff the collar. OT to continue to follow acutely to facilitate progress towards established goals. Pt will continue to benefit from [facility]        If plan is discharge home, recommend the following:  A little help with walking and/or transfers;A little help with bathing/dressing/bathroom;Direct supervision/assist for medications management;Direct supervision/assist for financial management;Assist for transportation;Supervision due to cognitive status   Equipment Recommendations  None recommended by OT       Precautions / Restrictions Precautions Precautions: Cervical Precaution Booklet Issued: Yes (comment) Precaution Comments: Reviewed with patient Required Braces or Orthoses: Cervical Brace Cervical Brace: Hard collar;At all times Restrictions Weight Bearing Restrictions: No       Mobility Bed Mobility Overal bed mobility: Needs Assistance Bed Mobility: Rolling, Sidelying to Sit Rolling: Supervision Sidelying to sit: Supervision            Transfers Overall transfer level: Needs assistance Equipment used: Rolling walker (2 wheels) Transfers: Sit to/from Stand Sit to Stand: Supervision           General transfer comment: RW to increase safety     Balance Overall balance  assessment: No apparent balance deficits (not formally assessed)                                         ADL either performed or assessed with clinical judgement   ADL Overall ADL's : Needs assistance/impaired     Grooming: Supervision/safety;Standing           Upper Body Dressing : Set up;Sitting   Lower Body Dressing: Supervision/safety;Sit to/from stand Lower Body Dressing Details (indicate cue type and reason): cues for compensatory technique Toilet Transfer: Supervision/safety;Ambulation;Regular Toilet;Rolling walker (2 wheels)   Toileting- Clothing Manipulation and Hygiene: Modified independent;Sitting/lateral lean       Functional mobility during ADLs: Supervision/safety;Rolling walker (2 wheels) General ADL Comments: cues for back precautions, safety and compensatory techniques    Extremity/Trunk Assessment Upper Extremity Assessment Upper Extremity Assessment: Generalized weakness   Lower Extremity Assessment Lower Extremity Assessment: Defer to PT evaluation        Vision   Vision Assessment?: No apparent visual deficits   Perception Perception Perception: Not tested   Praxis Praxis Praxis: Not tested    Cognition Arousal: Alert Behavior During Therapy: Flat affect Overall Cognitive Status: Impaired/Different from baseline Area of Impairment: Memory, Awareness, Problem solving                     Memory: Decreased recall of precautions, Decreased short-term memory     Awareness: Emergent Problem Solving: Slow processing, Requires verbal cues General Comments: unable to recall precautions initially, needed "BLT" cue to recall 2/3  General Comments VSS on RA    Pertinent Vitals/ Pain       Pain Assessment Pain Assessment: Faces Faces Pain Scale: Hurts a little bit Pain Location: neck Pain Descriptors / Indicators: Grimacing, Guarding Pain Intervention(s): Limited activity within patient's tolerance,  Monitored during session   Frequency  Min 1X/week        Progress Toward Goals  OT Goals(current goals can now be found in the care plan section)  Progress towards OT goals: Progressing toward goals  Acute Rehab OT Goals Patient Stated Goal: to go home OT Goal Formulation: With patient Time For Goal Achievement: 03/01/23 Potential to Achieve Goals: Good ADL Goals Pt Will Perform Lower Body Bathing: with supervision;sitting/lateral leans;sit to/from stand Pt Will Perform Lower Body Dressing: with supervision;sit to/from stand;sitting/lateral leans Pt Will Transfer to Toilet: with supervision;ambulating Pt Will Perform Toileting - Clothing Manipulation and hygiene: with supervision;sitting/lateral leans;sit to/from stand Additional ADL Goal #1: Patient will be able to recall precautions without assist in order to ensure safety at discharge.   AM-PAC OT "6 Clicks" Daily Activity     Outcome Measure   Help from another person eating meals?: None Help from another person taking care of personal grooming?: A Little Help from another person toileting, which includes using toliet, bedpan, or urinal?: A Little Help from another person bathing (including washing, rinsing, drying)?: A Little Help from another person to put on and taking off regular upper body clothing?: A Little Help from another person to put on and taking off regular lower body clothing?: A Little 6 Click Score: 19    End of Session Equipment Utilized During Treatment: Rolling walker (2 wheels)  OT Visit Diagnosis: Muscle weakness (generalized) (M62.81);Pain;Other symptoms and signs involving cognitive function   Activity Tolerance Patient tolerated treatment well   Patient Left in chair;with call bell/phone within reach;with family/visitor present   Nurse Communication Mobility status        Time: 1305-1320 OT Time Calculation (min): 15 min  Charges: OT General Charges $OT Visit: 1 Visit OT  Treatments $Self Care/Home Management : 8-22 mins  Tiffany [name], OTR/L Acute Rehabilitation Services Office [phone] Secure Chat Communication Preferred   Tiffany [name] 02/16/2023, 1:29 PM

## 2023-02-17 ENCOUNTER — Encounter (HOSPITAL_COMMUNITY): Payer: Self-pay | Admitting: Orthopedic Surgery

## 2023-02-18 ENCOUNTER — Telehealth: Payer: Self-pay

## 2023-02-18 MED FILL — Thrombin For Soln 5000 Unit: CUTANEOUS | Qty: 2 | Status: AC

## 2023-02-18 NOTE — Transitions of Care (Post Inpatient/ED Visit) (Signed)
02/18/2023  Name: Tiffany [name] MRN: [id number] DOB: November 22, 1950  Today's TOC FU Call Status: Today's TOC FU Call Status:: Successful TOC FU Call Completed TOC FU Call Complete Date: 02/18/23 Patient's Name and Date of Birth confirmed.  Transition Care Management Follow-up Telephone Call Date of Discharge: 02/14/23 Discharge Facility: [facility]) Type of Discharge: Inpatient Admission Primary Inpatient Discharge Diagnosis:: Myeloradiculopathy How have you been since you were released from the hospital?: Better (she is having pain from the surgey still.) Any questions or concerns?: No  Items Reviewed: Did you receive and understand the discharge instructions provided?: Yes Medications obtained,verified, and reconciled?: Yes (Medications Reviewed) (Patient states she understands her medications but she's still working on the new medications that they placed her on.) Any new allergies since your discharge?: No Dietary orders reviewed?: NA Do you have support at home?: Yes People in Home: child(ren), adult, other relative(s) Name of Support/Comfort Primary Source: [name] (Son) [name] (son's wife)  Medications Reviewed Today: Medications Reviewed Today   Medications were not reviewed in this encounter     Home Care and Equipment/Supplies: Were Home Health Services Ordered?: No Any new equipment or medical supplies ordered?: No  Functional Questionnaire: Do you need assistance with bathing/showering or dressing?: No Do you need assistance with meal preparation?: No Do you need assistance with eating?: No Do you have difficulty maintaining continence: No Do you need assistance with getting out of bed/getting out of a chair/moving?: No Do you have difficulty managing or taking your medications?: No  Follow up appointments reviewed: PCP Follow-up appointment confirmed?: No MD Provider Line Number:[phone] Given: Yes Specialist Hospital Follow-up appointment  confirmed?: Yes Date of Specialist follow-up appointment?: 03/03/23 Follow-Up Specialty Provider:: Dr. Elton Sin, MD Do you need transportation to your follow-up appointment?: No Do you understand care options if your condition(s) worsen?: Yes-patient verbalized understanding  Patient is scheduled to see her Surgeon 10/07 she states for the time being she wants to see him and will call our office if she needs to see Dr. Jonny Ruiz.    SIGNATUREReather Laurence, RMA

## 2023-02-19 NOTE — Progress Notes (Signed)
Carelink Summary Report / Loop Recorder 

## 2023-02-28 ENCOUNTER — Telehealth: Payer: Self-pay | Admitting: Orthopedic Surgery

## 2023-02-28 MED ORDER — OXYCODONE HCL 5 MG PO TABS
5.0000 mg | ORAL_TABLET | ORAL | 0 refills | Status: AC | PRN
Start: 2023-02-28 — End: 2023-03-05

## 2023-02-28 NOTE — Addendum Note (Signed)
Addended by: Willia Craze on: 02/28/2023 11:22 AM   Modules accepted: Orders

## 2023-02-28 NOTE — Telephone Encounter (Signed)
Patient called and said she needs a refill on pain medication. She wants it to be sent the [facility]. CB#[phone]

## 2023-03-03 ENCOUNTER — Ambulatory Visit (INDEPENDENT_AMBULATORY_CARE_PROVIDER_SITE_OTHER): Payer: Medicare Other | Admitting: Orthopedic Surgery

## 2023-03-03 ENCOUNTER — Other Ambulatory Visit (INDEPENDENT_AMBULATORY_CARE_PROVIDER_SITE_OTHER): Payer: Self-pay

## 2023-03-03 DIAGNOSIS — Z981 Arthrodesis status: Secondary | ICD-10-CM

## 2023-03-03 DIAGNOSIS — M25552 Pain in left hip: Secondary | ICD-10-CM

## 2023-03-03 NOTE — Progress Notes (Signed)
Orthopedic Surgery Post-operative Office Visit  Procedure: C3-5 ACDF Date of Surgery: 02/14/2023 (~2 weeks post-op)  Assessment: Patient is a 72 y.o. female who is doing well with her cervical spine surgery but had a fall and has had acute onset of left hip pain   Plan: -Operative plans complete -Out of bed as tolerated, aspen collar -Will discontinue her cervical collar at her next visit -No bending/lifting/twisting greater than 10 pounds -Okay to let soap/water run over the incision, do not submerge -Pain management: oxycodone -Ordered a CT scan of her pelvis to evaluate for fracture -Told her if pain is bad enough, she cannot ambulate, or cannot wait for the results of an outpatient imaging, then her best option would be go to the emergency department -Will call her with the results of the CT scan if she decides to get it as an outpatient -Return to office in 4 weeks, x-rays needed at next visit: AP/lateral cervical  ___________________________________________________________________________   Subjective: Patient has been at home.  She has been having some neck pain but it has not slowly been getting better with time.  The pain that had been radiating into her shoulders has improved significantly.  She has not noticed any redness or drainage around her incision.  She had a fall at her house a couple of days ago.  She said she was on a recliner sitting on the foot portion when it collapsed into the chair.  She fell and landed onto her buttock and left side.  She noted left hip pain.  She has had difficulty with ambulating since this fall.  Pain is felt on the left buttock and left lateral hip.  Tasks like dressing herself have taken significantly longer due to the pain.  She said it took her 1.5 to 2 hours to get dressed this morning with the help of her family.  She has been using the oxycodone to help with this pain.    Objective:  General: no acute distress, appropriate  affect Neurologic: alert, answering questions appropriately, following commands Respiratory: unlabored breathing on room air Skin: incision is well-approximated with no erythema, induration, active/expressible drainage  MSK (spine):  -Strength exam      Left  Right Grip strength                5/5  5/5 Interosseus   5/5   5/5 Wrist extension  5/5  5/5 Wrist flexion   5/5  5/5 Elbow flexion   5/5  5/5 Deltoid    4+/5  4+/5  -Sensory exam    Sensation intact to light touch in C5-T1 nerve distributions of bilateral upper extremities  Left hip exam: pain with log roll, very tender and winces/moans with light palpation over the proximal lateral aspect of the thigh in the area of the greater trochanter.  EHL/TA/GSC intact.  TTP over the ischial tuberosity as well.    Imaging: X-rays of the cervical spine from 03/03/2023 were independently reviewed and interpreted, showing anterior instrumentation from C3-5 that appears in appropriate position.  No lucency around the screws.  Allograft interbody devices at C3/4 and C4/5 that appear in appropriate position.  Disc height loss with anterior osteophyte formation seen at C5/6 and C6/7.  No fracture or dislocation seen.  XRs of the left hip from 03/03/2023 were independently reviewed and interpreted, showing no significant degenerative changes within the hip joint.  No fracture or dislocation seen.  Patient name: Tiffany [name] Patient MRN: [id number] Date of visit: 03/03/23

## 2023-03-04 ENCOUNTER — Emergency Department (HOSPITAL_BASED_OUTPATIENT_CLINIC_OR_DEPARTMENT_OTHER): Payer: Medicare Other

## 2023-03-04 ENCOUNTER — Encounter (HOSPITAL_BASED_OUTPATIENT_CLINIC_OR_DEPARTMENT_OTHER): Payer: Self-pay | Admitting: Urology

## 2023-03-04 ENCOUNTER — Other Ambulatory Visit: Payer: Self-pay

## 2023-03-04 ENCOUNTER — Emergency Department (HOSPITAL_BASED_OUTPATIENT_CLINIC_OR_DEPARTMENT_OTHER)
Admission: EM | Admit: 2023-03-04 | Discharge: 2023-03-04 | Disposition: A | Discharge status: Home / Self Care | Payer: Medicare Other | Attending: Emergency Medicine | Admitting: Emergency Medicine

## 2023-03-04 DIAGNOSIS — G8929 Other chronic pain: Secondary | ICD-10-CM | POA: Insufficient documentation

## 2023-03-04 DIAGNOSIS — M25552 Pain in left hip: Secondary | ICD-10-CM | POA: Diagnosis not present

## 2023-03-04 DIAGNOSIS — N8110 Cystocele, unspecified: Secondary | ICD-10-CM | POA: Diagnosis not present

## 2023-03-04 DIAGNOSIS — S3991XA Unspecified injury of abdomen, initial encounter: Secondary | ICD-10-CM | POA: Diagnosis not present

## 2023-03-04 DIAGNOSIS — N200 Calculus of kidney: Secondary | ICD-10-CM | POA: Diagnosis not present

## 2023-03-04 MED ORDER — OXYCODONE HCL 5 MG PO TABS
5.0000 mg | ORAL_TABLET | Freq: Four times a day (QID) | ORAL | 0 refills | Status: DC | PRN
Start: 2023-03-04 — End: 2023-03-20

## 2023-03-04 MED ORDER — ACETAMINOPHEN 500 MG PO TABS
1000.0000 mg | ORAL_TABLET | Freq: Once | ORAL | Status: AC
  Administered 2023-03-04: 1000 mg via ORAL
  Filled 2023-03-04: qty 2

## 2023-03-04 MED ORDER — CELECOXIB 200 MG PO CAPS: 200.0000 mg | ORAL_CAPSULE | Freq: Two times a day (BID) | ORAL | 0 refills | Status: DC

## 2023-03-04 MED ORDER — OXYCODONE HCL 5 MG PO TABS
10.0000 mg | ORAL_TABLET | Freq: Once | ORAL | Status: AC
  Administered 2023-03-04: 10 mg via ORAL
  Filled 2023-03-04: qty 2

## 2023-03-04 MED ORDER — CELECOXIB 200 MG PO CAPS
200.0000 mg | ORAL_CAPSULE | Freq: Once | ORAL | Status: AC
  Administered 2023-03-04: 200 mg via ORAL
  Filled 2023-03-04: qty 1

## 2023-03-04 NOTE — ED Provider Notes (Signed)
[place] EMERGENCY DEPARTMENT AT [facility] Provider Note   CSN: [id number] Arrival date & time: 03/04/23  1723     History Chief Complaint  Patient presents with   Hip Pain    HPI Tiffany [name] is a 72 y.o. female presenting for chief complaint of left hip pain.  Endorses chronic left hip pain chronic over years also recent neck surgery for neck pain.  Medical history reveals multifaceted musculoskeletal pain with a list of pain including right lumbar, left upper, right upper, left knee, right knee, low back, cervical pain, and multilevel degenerative disc disease. Today she is endorsing severe left hip pain that is keeping her from safely ambulating.  Has not taken anything for the pain today.  States that she saw her neurosurgeon for a checkup after her recent C8 procedure and they recommended a CT pelvis.  Patient's recorded medical, surgical, social, medication list and allergies were reviewed in the Snapshot window as part of the initial history.   Review of Systems   Review of Systems  Constitutional:  Negative for chills and fever.  HENT:  Negative for ear pain and sore throat.   Eyes:  Negative for pain and visual disturbance.  Respiratory:  Negative for cough and shortness of breath.   Cardiovascular:  Negative for chest pain and palpitations.  Gastrointestinal:  Negative for abdominal pain and vomiting.  Genitourinary:  Negative for dysuria and hematuria.  Musculoskeletal:  Positive for gait problem. Negative for arthralgias and back pain.  Skin:  Negative for color change and rash.  Neurological:  Negative for seizures and syncope.  All other systems reviewed and are negative.   Physical Exam Updated Vital Signs BP 133/80   Pulse 79   Temp 98 F (36.7 C)   Resp 18   Ht 5\' 2"  (1.575 m)   Wt 73.4 kg   SpO2 100%   BMI 29.60 kg/m  Physical Exam Vitals and nursing note reviewed.  Constitutional:      General: She is not in acute distress.     Appearance: She is well-developed.  HENT:     Head: Normocephalic and atraumatic.  Eyes:     Conjunctiva/sclera: Conjunctivae normal.  Cardiovascular:     Rate and Rhythm: Normal rate and regular rhythm.     Heart sounds: No murmur heard. Pulmonary:     Effort: Pulmonary effort is normal. No respiratory distress.     Breath sounds: Normal breath sounds.  Abdominal:     General: There is no distension.     Palpations: Abdomen is soft.     Tenderness: There is no abdominal tenderness. There is no right CVA tenderness or left CVA tenderness.  Musculoskeletal:        General: Tenderness (TTP at left hip.  Patient is in an [place] from her recent procedure.) present. No swelling. Normal range of motion.     Cervical back: Neck supple.  Skin:    General: Skin is warm and dry.  Neurological:     General: No focal deficit present.     Mental Status: She is alert and oriented to person, place, and time. Mental status is at baseline.     Cranial Nerves: No cranial nerve deficit.      ED Course/ Medical Decision Making/ A&P    Procedures Procedures   Medications Ordered in ED Medications  acetaminophen (TYLENOL) tablet 1,000 mg (1,000 mg Oral Given 03/04/23 2014)  celecoxib (CELEBREX) capsule 200 mg (200 mg Oral Given 03/04/23  2016)  oxyCODONE (Oxy IR/ROXICODONE) immediate release tablet 10 mg (10 mg Oral Given 03/04/23 2013)    Medical Decision Making:    Tiffany [name] is a 72 y.o. female who presented to the ED today with hip pain detailed above.     Additional history discussed with patient's family/caregivers.  Patient placed on continuous vitals and telemetry monitoring while in ED which was reviewed periodically.   Complete initial physical exam performed, notably the patient  was hemodynamically stable no acute distress.      Reviewed and confirmed nursing documentation for past medical history, family history, social history.    Initial Assessment:   With the patient's  presentation of hip pain, most likely diagnosis is musculoskeletal etiology. Other diagnoses were considered including (but not limited to) fracture, referred pain from her back. These are considered less likely due to history of present illness and physical exam findings.   This is most consistent with an acute life/limb threatening illness complicated by underlying chronic conditions.  Initial Plan:  Will proceed with a CT abdomen pelvis as requested by orthopedic surgery. Will also treat in the interim with NSAIDs, Tylenol and pain medication for treatment of her nonambulatory status which is secondary to her pain she reports.  Does not appear consistent with any neurologic injury based on her normal neurologic exam at this time.  Initial Study Results:   Radiology  All images reviewed independently. Agree with radiology report at this time.   CT ABDOMEN PELVIS WO CONTRAST  Result Date: 03/04/2023 CLINICAL DATA:  Blunt abdominal trauma, left hip pain, fall EXAM: CT ABDOMEN AND PELVIS WITHOUT CONTRAST TECHNIQUE: Multidetector CT imaging of the abdomen and pelvis was performed following the standard protocol without IV contrast. RADIATION DOSE REDUCTION: This exam was performed according to the departmental dose-optimization program which includes automated exposure control, adjustment of the mA and/or kV according to patient size and/or use of iterative reconstruction technique. COMPARISON:  03/10/2021 FINDINGS: Lower chest: The visualized lung bases are clear. Moderate multi-vessel coronary artery calcification. No acute abnormality. Hepatobiliary: No focal liver abnormality is seen. No gallstones, gallbladder wall thickening, or biliary dilatation. Pancreas: Unremarkable Spleen: Unremarkable Adrenals/Urinary Tract: The adrenal glands are unremarkable. The kidneys are normal in size and position. Rim calcified 2.7 cm cystic lesion within the a medial interpolar region of the right kidney demonstrates  slight interval increase in size since prior examination with this measured 2.2 cm in diameter and is indeterminate. 2 mm nonobstructing calculus noted within the lower pole the left kidney. The kidneys are otherwise unremarkable. Small cystocele noted. The bladder is otherwise unremarkable. Stomach/Bowel: Moderate colonic stool burden without evidence of obstruction. Stomach, small bowel, and large bowel are otherwise unremarkable. The appendix is not visualized and is likely absent. No free intraperitoneal gas or fluid. Vascular/Lymphatic: Moderate aortoiliac atherosclerotic calcification. No aortic aneurysm. No pathologic adenopathy within the abdomen and pelvis. Reproductive: Status post hysterectomy. No adnexal masses. Other: There is subcutaneous infiltration within the soft tissues lateral to the left hip in keeping with subcutaneous contusion. No mass formed hematoma identified. Musculoskeletal: L4-5 lumbar fusion with instrumentation has been performed. No acute bone abnormality. No lytic or blastic bone lesion. There is perimuscular infiltration involving the insertion of the gluteus medius upon the greater trochanter in keeping with mild edema or contusion. IMPRESSION: 1. Subcutaneous contusion lateral to the left hip. Mild edema or contusion involving the insertion of the gluteus medius upon the greater trochanter. No mass formed hematoma identified. 2. Moderate multi-vessel  coronary artery calcification. 3. 2.7 cm rim calcified cystic lesion within the medial interpolar region of the right kidney demonstrates slight interval increase in size since prior examination with this measured 2.2 cm in diameter and is indeterminate. Nonemergent renal protocol CT or MRI examination is recommended for further evaluation. 4. Nonobstructing left renal calculus. 5. Small cystocele. Aortic Atherosclerosis (ICD10-I70.0). Electronically Signed   By: Helyn Numbers M.D.   On: 03/04/2023 20:16     Reassessment and Plan:    Given reassuring CT scan, patient stable for outpatient care and management. Patient now ambulatory tolerating p.o. intake after more aggressive treatment of her pain. This has restored her ambulatory function.  We have discussed risk of fall or other injury but she feels stable at this time with the current treatment plan.  Patient will need to follow-up with her PCP in the outpatient setting for definitive care and management as well as orthopedics as referred.   Disposition:  I have considered need for hospitalization, however, considering all of the above, I believe this patient is stable for discharge at this time.  Patient/family educated about specific return precautions for given chief complaint and symptoms.  Patient/family educated about follow-up with PCP.     Patient/family expressed understanding of return precautions and need for follow-up. Patient spoken to regarding all imaging and laboratory results and appropriate follow up for these results. All education provided in verbal form with additional information in written form. Time was allowed for answering of patient questions. Patient discharged.    Emergency Department Medication Summary:   Medications  acetaminophen (TYLENOL) tablet 1,000 mg (1,000 mg Oral Given 03/04/23 2014)  celecoxib (CELEBREX) capsule 200 mg (200 mg Oral Given 03/04/23 2016)  oxyCODONE (Oxy IR/ROXICODONE) immediate release tablet 10 mg (10 mg Oral Given 03/04/23 2013)         Clinical Impression:  1. Pain of left hip      Discharge   Final Clinical Impression(s) / ED Diagnoses Final diagnoses:  Pain of left hip    Rx / DC Orders ED Discharge Orders          Ordered    celecoxib (CELEBREX) 200 MG capsule  2 times daily        03/04/23 2250    oxyCODONE (ROXICODONE) 5 MG immediate release tablet  Every 6 hours PRN        03/04/23 2250              Glyn Ade, MD 03/04/23 2251

## 2023-03-04 NOTE — ED Triage Notes (Signed)
Pt states left hip pain x 3 days, Denies any injury  Pain worse with ROM, no ROM at this time due to Pain  States fall this am due to hip going out

## 2023-03-04 NOTE — ED Notes (Signed)
 RN reviewed discharge instructions with pt. Pt verbalized understanding and had no further questions. VSS upon discharge.  

## 2023-03-04 NOTE — ED Notes (Signed)
Pt ambulated to restroom with walker, states pain significantly improved. Countryman MD made aware

## 2023-03-04 NOTE — Discharge Instructions (Addendum)
You were seen today for left hip pain.  Your evaluation did not reveal any focal pathology fortunately.  I have improved your symptoms with increased pain control including anti-inflammatories and narcotic pain medication.  Please use these medications as needed for the next 5 days.  As we discussed narcotics increase your risk of falls which may be part of why you fell earlier today.  However given that they have restored your ability to walk and there is no focal pathology neurology on your CT, believe pain control is reasonable please use your walker at all times. We found the below findings on your CT scan and the need to be followed up by your primary care doctor.  Please follow-up with orthopedics for definitive management of your hip pain.  3. 2.7 cm rim calcified cystic lesion within the medial interpolar  region of the right kidney demonstrates slight interval increase in  size since prior examination with this measured 2.2 cm in diameter  and is indeterminate. Nonemergent renal protocol CT or MRI  examination is recommended for further evaluation.

## 2023-03-10 ENCOUNTER — Inpatient Hospital Stay (INDEPENDENT_AMBULATORY_CARE_PROVIDER_SITE_OTHER): Payer: Medicare Other

## 2023-03-10 ENCOUNTER — Emergency Department (HOSPITAL_COMMUNITY): Payer: Medicare Other

## 2023-03-10 ENCOUNTER — Encounter (HOSPITAL_COMMUNITY): Payer: Self-pay

## 2023-03-10 ENCOUNTER — Emergency Department (HOSPITAL_COMMUNITY)
Admission: EM | Admit: 2023-03-10 | Discharge: 2023-03-10 | Disposition: A | Discharge status: Home / Self Care | Payer: Medicare Other | Attending: Emergency Medicine | Admitting: Emergency Medicine

## 2023-03-10 ENCOUNTER — Other Ambulatory Visit: Payer: Self-pay

## 2023-03-10 DIAGNOSIS — M62838 Other muscle spasm: Secondary | ICD-10-CM | POA: Diagnosis not present

## 2023-03-10 DIAGNOSIS — M19012 Primary osteoarthritis, left shoulder: Secondary | ICD-10-CM | POA: Diagnosis not present

## 2023-03-10 DIAGNOSIS — Z7982 Long term (current) use of aspirin: Secondary | ICD-10-CM | POA: Diagnosis not present

## 2023-03-10 DIAGNOSIS — I639 Cerebral infarction, unspecified: Secondary | ICD-10-CM

## 2023-03-10 DIAGNOSIS — M25512 Pain in left shoulder: Secondary | ICD-10-CM | POA: Diagnosis not present

## 2023-03-10 DIAGNOSIS — Z794 Long term (current) use of insulin: Secondary | ICD-10-CM | POA: Diagnosis not present

## 2023-03-10 LAB — CBC WITH DIFFERENTIAL/PLATELET
Abs Immature Granulocytes: 0.03 10*3/uL (ref 0.00–0.07)
Basophils Absolute: 0 10*3/uL (ref 0.0–0.1)
Basophils Relative: 0 %
Eosinophils Absolute: 0 10*3/uL (ref 0.0–0.5)
Eosinophils Relative: 0 %
HCT: 37.2 % (ref 36.0–46.0)
Hemoglobin: 12.3 g/dL (ref 12.0–15.0)
Immature Granulocytes: 0 %
Lymphocytes Relative: 14 %
Lymphs Abs: 0.9 10*3/uL (ref 0.7–4.0)
MCH: 29.5 pg (ref 26.0–34.0)
MCHC: 33.1 g/dL (ref 30.0–36.0)
MCV: 89.2 fL (ref 80.0–100.0)
Monocytes Absolute: 0.7 10*3/uL (ref 0.1–1.0)
Monocytes Relative: 10 %
Neutro Abs: 5.1 10*3/uL (ref 1.7–7.7)
Neutrophils Relative %: 76 %
Platelets: 311 10*3/uL (ref 150–400)
RBC: 4.17 MIL/uL (ref 3.87–5.11)
RDW: 13.8 % (ref 11.5–15.5)
WBC: 6.7 10*3/uL (ref 4.0–10.5)
nRBC: 0 % (ref 0.0–0.2)

## 2023-03-10 LAB — BASIC METABOLIC PANEL
Anion gap: 11 (ref 5–15)
BUN: 13 mg/dL (ref 8–23)
CO2: 24 mmol/L (ref 22–32)
Calcium: 8.8 mg/dL — ABNORMAL LOW (ref 8.9–10.3)
Chloride: 98 mmol/L (ref 98–111)
Creatinine, Ser: 0.76 mg/dL (ref 0.44–1.00)
GFR, Estimated: >60 mL/min (ref >60)
Glucose, Bld: 197 mg/dL — ABNORMAL HIGH (ref 70–99)
Potassium: 3.8 mmol/L (ref 3.5–5.1)
Sodium: 133 mmol/L — ABNORMAL LOW (ref 135–145)

## 2023-03-10 MED ORDER — DIAZEPAM 5 MG/ML IJ SOLN
5.0000 mg | Freq: Once | INTRAMUSCULAR | Status: AC
  Administered 2023-03-10: 5 mg via INTRAVENOUS
  Filled 2023-03-10: qty 2

## 2023-03-10 MED ORDER — KETOROLAC TROMETHAMINE 30 MG/ML IJ SOLN
30.0000 mg | Freq: Once | INTRAMUSCULAR | Status: AC
  Administered 2023-03-10: 30 mg via INTRAVENOUS
  Filled 2023-03-10: qty 1

## 2023-03-10 MED ORDER — HYDROMORPHONE HCL 1 MG/ML IJ SOLN
1.0000 mg | Freq: Once | INTRAMUSCULAR | Status: AC
  Administered 2023-03-10: 1 mg via INTRAVENOUS
  Filled 2023-03-10: qty 1

## 2023-03-10 MED ORDER — ONDANSETRON HCL 4 MG/2ML IJ SOLN
4.0000 mg | Freq: Once | INTRAMUSCULAR | Status: AC
  Administered 2023-03-10: 4 mg via INTRAVENOUS
  Filled 2023-03-10: qty 2

## 2023-03-10 MED ORDER — CYCLOBENZAPRINE HCL 10 MG PO TABS: 10.0000 mg | ORAL_TABLET | Freq: Two times a day (BID) | ORAL | 0 refills | Status: DC | PRN

## 2023-03-10 NOTE — ED Triage Notes (Signed)
Pt reports fall last week, had left hip pain and was checked by [facility]. 2 days later, pt started c/o left shoulder pain. Pt states she can't move arm due to pain, denies weakness. Strength present and equal on each side.

## 2023-03-10 NOTE — Discharge Instructions (Addendum)
As we discussed, you have muscle spasms of the shoulder.  You do not have any fractures right now.  Please continue to oxycodone as prescribed by your orthopedic doctor.  I have prescribed Flexeril as needed  Please call Dr. Kathi Der office this week for appointment.  Return to ER if you have severe pain, muscle spasms, numbness or weakness

## 2023-03-10 NOTE — ED Provider Notes (Signed)
[place] EMERGENCY DEPARTMENT AT [facility] Provider Note   CSN: [id number] Arrival date & time: 03/10/23  1547     History  Chief Complaint  Patient presents with   Shoulder Injury    Tiffany [name] is a 72 y.o. female history of recent cervical discectomy and fusion, chronic pain here presenting with left shoulder pain.  Patient had a fall onto the left hip about a week ago.  Patient went to [facility] and had x-rays that did not show a fracture and a CT scan that showed hip contusion.  Patient is already on oxycodone after her neck surgery.  She has been taking it as prescribed but the pain is not under control.  She states that for the last 2 to 3 days, she noticed that her left shoulder has spasms.  She states that when she fell several days ago, she may have hit her shoulder but her hip was hurting more so with no imaging was done at that time.  She states that she has been wearing her Aspen collar and when she fell she definitely did not hit her head or neck.  The history is provided by the patient.       Home Medications Prior to Admission medications   Medication Sig Start Date End Date Taking? Authorizing Provider  EPINEPHRINE 0.3 mg/0.3 mL IJ SOAJ injection INJECT 0.3 MG INTO THE MUSCLE AS NEEDED FOR ANAPHYLAXIS 01/28/23   Corwin Levins, MD  Accu-Chek Softclix Lancets lancets USE   TO CHECK GLUCOSE AS DIRECTED TWICE DAILY 12/19/22   Corwin Levins, MD  acetaminophen (TYLENOL) 500 MG tablet Take 2 tablets (1,000 mg total) by mouth every 8 (eight) hours. 02/15/23   London Sheer, MD  amLODipine (NORVASC) 5 MG tablet TAKE 1 TABLET BY MOUTH EVERY DAY 11/05/22   Corwin Levins, MD  aspirin EC 325 MG EC tablet Take 1 tablet (325 mg total) by mouth daily. 10/09/20   Marvel Plan, MD  Blood Glucose Monitoring Suppl (ONE TOUCH ULTRA 2) w/Device KIT Apply 1 Device topically in the morning and at bedtime. E11.9 08/09/19   Corwin Levins, MD  celecoxib (CELEBREX) 200 MG capsule  Take 1 capsule (200 mg total) by mouth 2 (two) times daily. 03/04/23   Glyn Ade, MD  cetirizine (ZYRTEC) 5 MG tablet Take 1 tablet (5 mg total) by mouth daily. Patient taking differently: Take 5 mg by mouth daily as needed for allergies. 12/21/21   [name], FNP  Cholecalciferol (VITAMIN D) 50 MCG (2000 UT) tablet Take 2,000 Units by mouth daily.    [provider]  ezetimibe (ZETIA) 10 MG tablet TAKE 1 TABLET BY MOUTH EVERY DAY 11/05/22   Corwin Levins, MD  famotidine (PEPCID) 20 MG tablet Take 1 tablet (20 mg total) by mouth 2 (two) times daily. Patient taking differently: Take 20 mg by mouth daily as needed for heartburn or indigestion. 01/10/22   Corwin Levins, MD  gabapentin (NEURONTIN) 300 MG capsule Take 3 capsules (900 mg total) by mouth 3 (three) times daily. 10/17/22   Corwin Levins, MD  glucose blood [facility] ULTRA) test strip Use as instructed four times per day E11.9 01/10/22   Corwin Levins, MD  insulin glargine (LANTUS SOLOSTAR) 100 UNIT/ML Solostar Pen INJECT 55 UNITS INTO THE SKIN AT BEDTIME Patient taking differently: Inject 45 Units into the skin at bedtime. 10/29/22   Plotnikov, Georgina Quint, MD  Insulin Pen Needle 32G X  4 MM MISC Use to inject  lantus once daily 03/19/16   Veryl Speak, FNP  losartan (COZAAR) 100 MG tablet Take 1 tablet by mouth once daily Patient taking differently: Take 50 mg by mouth daily. 10/09/21   Corwin Levins, MD  ondansetron (ZOFRAN) 4 MG tablet Take 1 tablet (4 mg total) by mouth every 6 (six) hours as needed for nausea. 02/15/23   London Sheer, MD  oxyCODONE (ROXICODONE) 5 MG immediate release tablet Take 1 tablet (5 mg total) by mouth every 6 (six) hours as needed for severe pain. 03/04/23   Glyn Ade, MD  pantoprazole (PROTONIX) 40 MG tablet TAKE 1 TABLET BY MOUTH EVERY DAY 11/05/22   Corwin Levins, MD  phenol (CHLORASEPTIC) 1.4 % LIQD Use as directed 1 spray in the mouth or throat as needed for throat irritation / pain.  02/16/23   London Sheer, MD  pravastatin (PRAVACHOL) 80 MG tablet Take 1 tablet (80 mg total) by mouth daily. 10/17/22   Corwin Levins, MD  sodium chloride (OCEAN) 0.65 % SOLN nasal spray Place 1 spray into both nostrils as needed for congestion.    [provider]  triamcinolone (NASACORT) 55 MCG/ACT AERO nasal inhaler Place 2 sprays into the nose daily. Patient taking differently: Place 2 sprays into the nose daily as needed (allergies). 05/02/20   Corwin Levins, MD  VENTOLIN HFA 108 [phone] Base) MCG/ACT inhaler INHALE 2 PUFFS BY MOUTH EVERY 6 HOURS AS NEEDED FOR WHEEZING FOR SHORTNESS OF BREATH 10/09/21   Corwin Levins, MD  zolpidem (AMBIEN) 10 MG tablet Take 1 tablet (10 mg total) by mouth at bedtime as needed. 10/17/22   Corwin Levins, MD      Allergies    Contrast media [iodinated contrast media], Aspirin, Gadolinium derivatives, Metformin and related, Shellfish allergy, Tramadol, Penicillins, and Statins    Review of Systems   Review of Systems  Musculoskeletal:        Left shoulder pain  All other systems reviewed and are negative.   Physical Exam Updated Vital Signs BP (!) 151/94 (BP Location: Right Arm)   Pulse 92   Temp 99.8 F (37.7 C) (Oral)   Resp 18   Ht 5\' 2"  (1.575 m)   Wt 73.4 kg   SpO2 99%   BMI 29.60 kg/m  Physical Exam Vitals and nursing note reviewed.  Constitutional:      Comments: Uncomfortable and chronically ill  HENT:     Head: Normocephalic.     Nose: Nose normal.     Mouth/Throat:     Mouth: Mucous membranes are moist.  Eyes:     Extraocular Movements: Extraocular movements intact.     Pupils: Pupils are equal, round, and reactive to light.  Neck:     Comments: Aspen collar in place Cardiovascular:     Rate and Rhythm: Normal rate and regular rhythm.     Pulses: Normal pulses.     Heart sounds: Normal heart sounds.  Pulmonary:     Effort: Pulmonary effort is normal.     Breath sounds: Normal breath sounds.  Abdominal:     General:  Abdomen is flat.     Palpations: Abdomen is soft.  Musculoskeletal:     Comments: Patient has decreased range of motion of the left shoulder.  The shoulder does not appear to be dislocated.  However there is diffuse muscle tenderness around the shoulder.  Patient is unable to range it and appears  to have muscle spasms.  Patient has normal grip strength and normal radial pulse.  Skin:    General: Skin is warm.     Capillary Refill: Capillary refill takes less than 2 seconds.  Neurological:     General: No focal deficit present.     Mental Status: She is oriented to person, place, and time.  Psychiatric:        Mood and Affect: Mood normal.        Behavior: Behavior normal.     ED Results / Procedures / Treatments   Labs (all labs ordered are listed, but only abnormal results are displayed) Labs Reviewed  CBC WITH DIFFERENTIAL/PLATELET  BASIC METABOLIC PANEL    EKG None  Radiology No results found.  Procedures Procedures    Medications Ordered in ED Medications  HYDROmorphone (DILAUDID) injection 1 mg (has no administration in time range)  diazepam (VALIUM) injection 5 mg (has no administration in time range)  ketorolac (TORADOL) 30 MG/ML injection 30 mg (has no administration in time range)  ondansetron (ZOFRAN) injection 4 mg (has no administration in time range)    ED Course/ Medical Decision Making/ A&P                                 Medical Decision Making Cymone Yeske is a 72 y.o. female here presenting with left shoulder pain.  Patient may have injured it several days ago when she fell.  She did not hit her head and I do not think this is a cervical radiculopathy.  Will get x-ray of the shoulder and give pain medicine and muscle relaxant and anti-inflammatories.  6:48 PM I reviewed patient's labs and independently interpreted the imaging study.  Patient's labs were unremarkable.  X-ray did not show any fracture.  Patient is feeling better after pain  medicine.  Patient has oxycodone at home.  Will prescribe Flexeril as needed.  I encouraged her to call Dr. Kathi Der office for appointment this week   Problems Addressed: Muscle spasm of left shoulder: acute illness or injury  Amount and/or Complexity of Data Reviewed Labs: ordered. Decision-making details documented in ED Course. Radiology: ordered and independent interpretation performed. Decision-making details documented in ED Course.  Risk Prescription drug management.    Final Clinical Impression(s) / ED Diagnoses Final diagnoses:  None    Rx / DC Orders ED Discharge Orders     None         Charlynne Pander, MD 03/10/23 [phone]

## 2023-03-11 LAB — CUP PACEART REMOTE DEVICE CHECK
Date Time Interrogation Session: 20241013231110
Implantable Pulse Generator Implant Date: 20220802

## 2023-03-12 ENCOUNTER — Other Ambulatory Visit: Payer: Self-pay | Admitting: Internal Medicine

## 2023-03-13 ENCOUNTER — Other Ambulatory Visit: Payer: Self-pay

## 2023-03-19 ENCOUNTER — Telehealth: Payer: Self-pay | Admitting: Orthopedic Surgery

## 2023-03-19 NOTE — Telephone Encounter (Signed)
Pt requesting Oxycodone 5 mg, please advise

## 2023-03-20 MED ORDER — OXYCODONE HCL 5 MG PO TABS: 5.0000 mg | ORAL_TABLET | Freq: Four times a day (QID) | ORAL | 0 refills | Status: AC | PRN

## 2023-03-25 NOTE — Progress Notes (Signed)
Carelink Summary Report / Loop Recorder 

## 2023-03-31 IMAGING — CT CT HEAD W/O CM
4 series · 17 of 47 positions shown, 19 images · non-contrast
Comparison: CT head from same day at 4147 hours.

CLINICAL DATA: Follow-up right-sided weakness.

EXAM:
CT HEAD WITHOUT CONTRAST
TECHNIQUE: Contiguous axial images were obtained from the base of the skull
through the vertex without intravenous contrast.

[Series 2: head wo · axial · 0.39mm/px · z∈[-118,+2]mm · 7 of 33 slices shown, 9 images]
[im 5/33  brain]
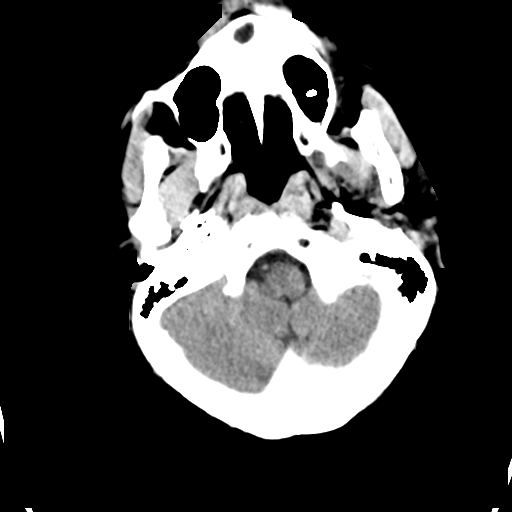
[im 5/33  bone]
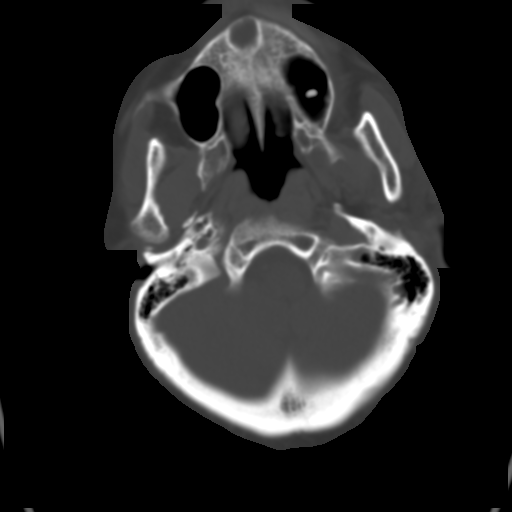
[im 9/33  brain]
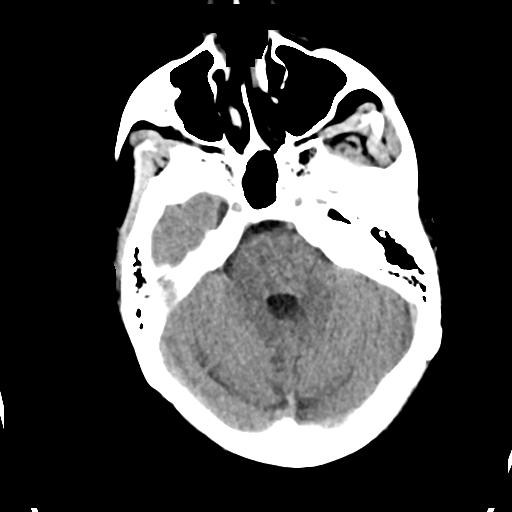
[im 13/33  brain]
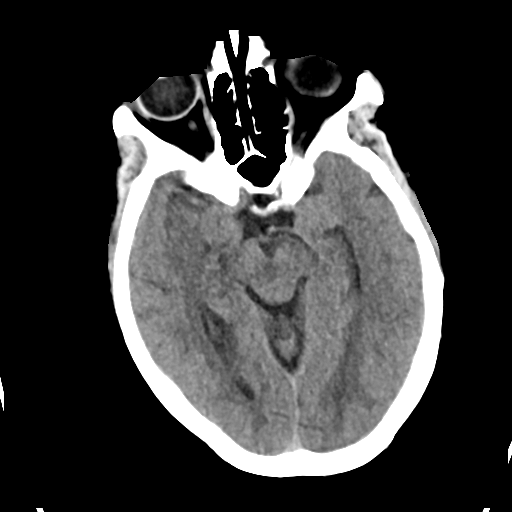
[im 17/33  brain]
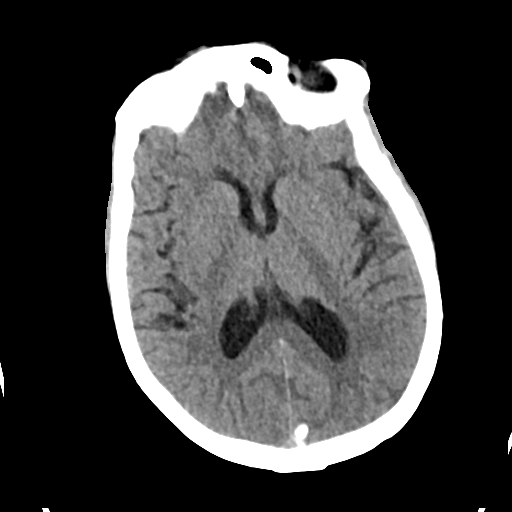
[im 21/33  brain]
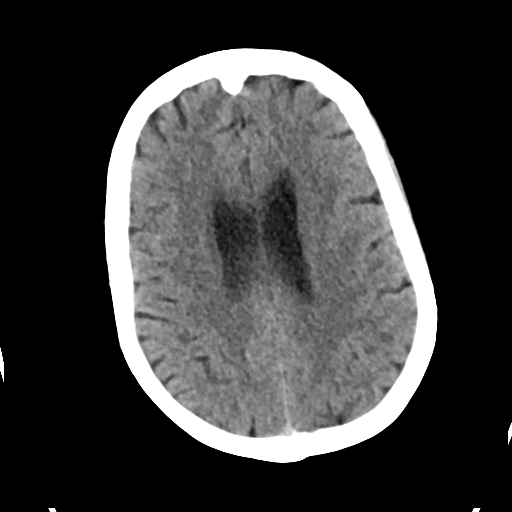
[im 21/33  bone]
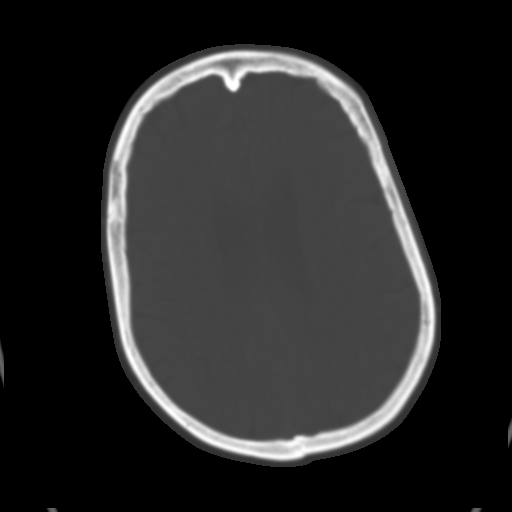
[im 25/33  brain]
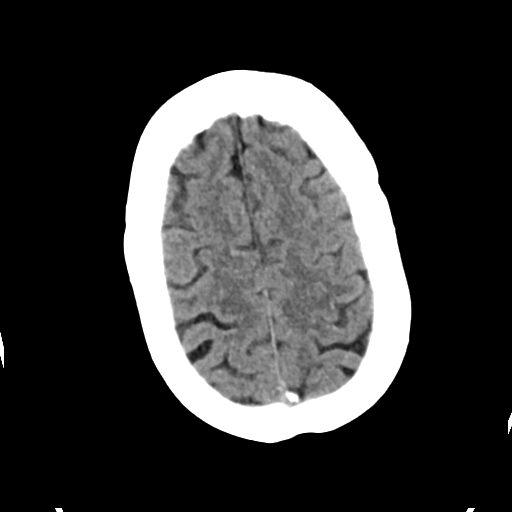
[im 29/33  brain]
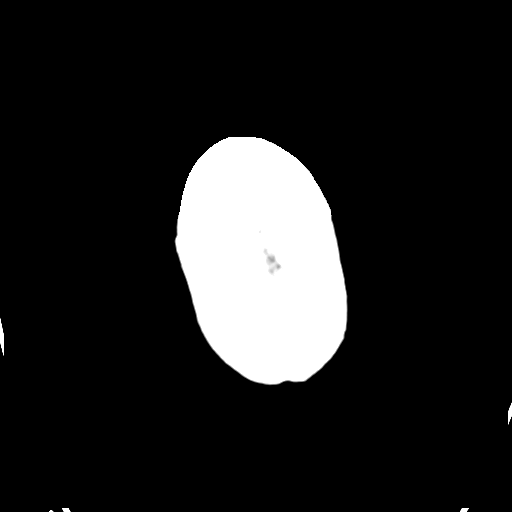

[Series 3: head bone · axial · 0.39mm/px · z∈[-122,-66]mm · 4 of 81 slices shown]
[im 9/81  bone]
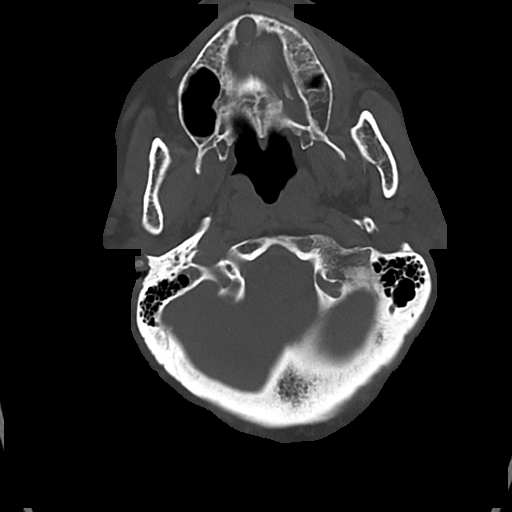
[im 17/81  bone]
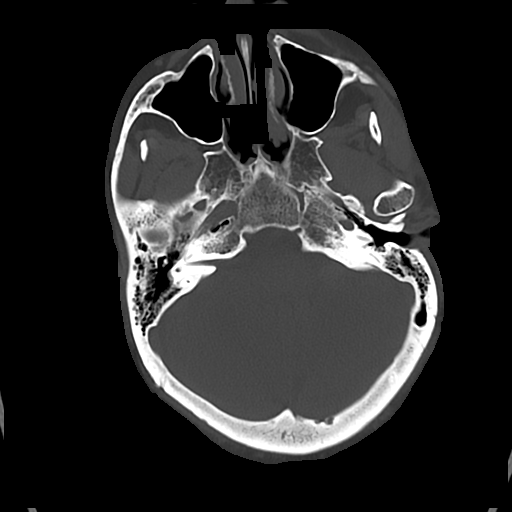
[im 25/81  bone]
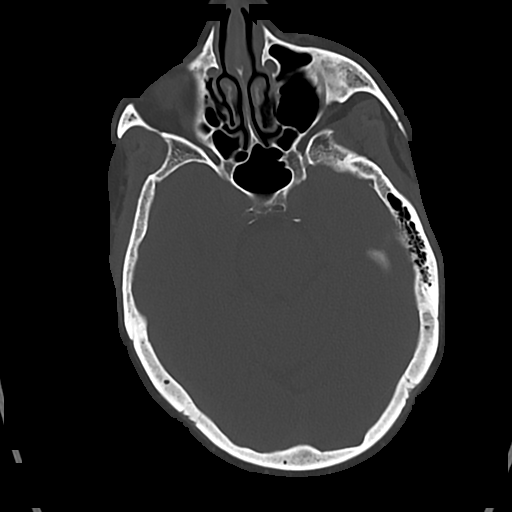
[im 37/81  bone]
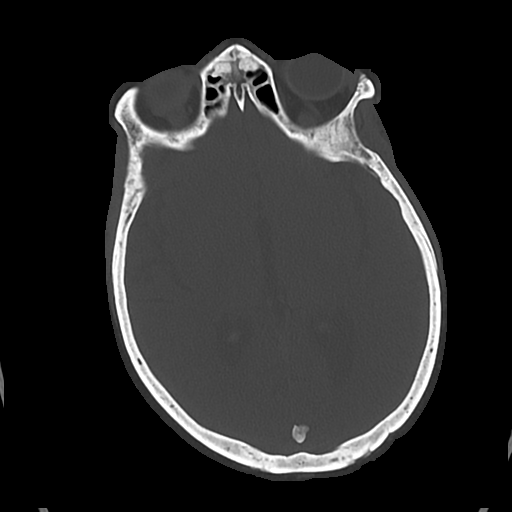

[Series 4: cor soft · coronal · 0.30mm/px · 3 of 64 slices shown]
[im 22/64  brain]
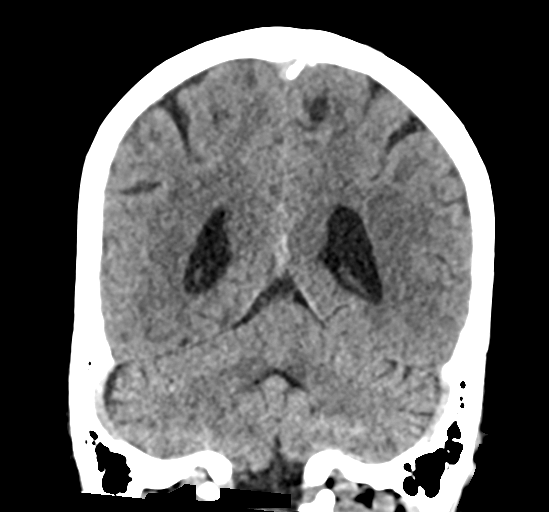
[im 29/64  brain]
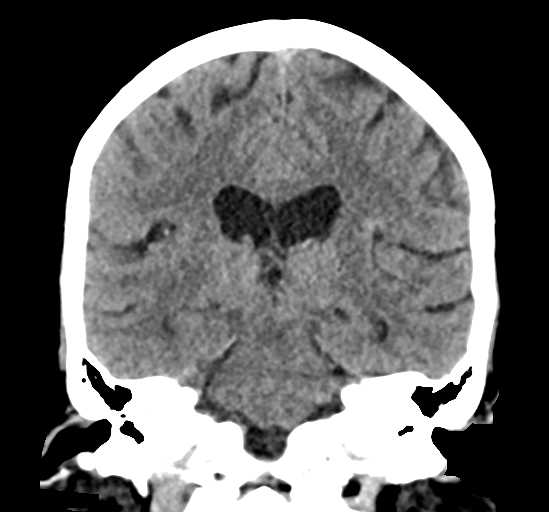
[im 36/64  brain]
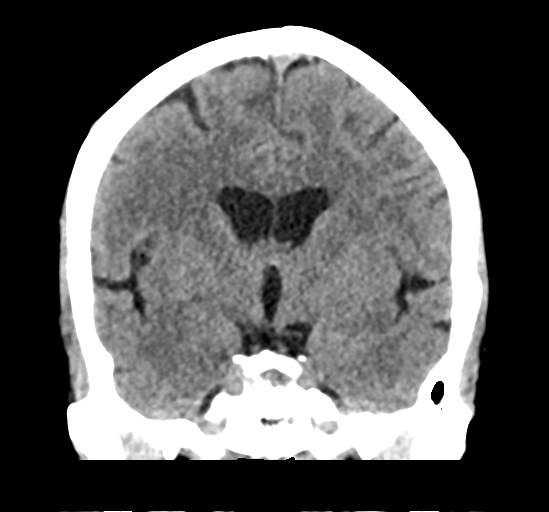

[Series 5: sag soft · sagittal · 0.30mm/px · 3 of 54 slices shown]
[im 18/54  brain]
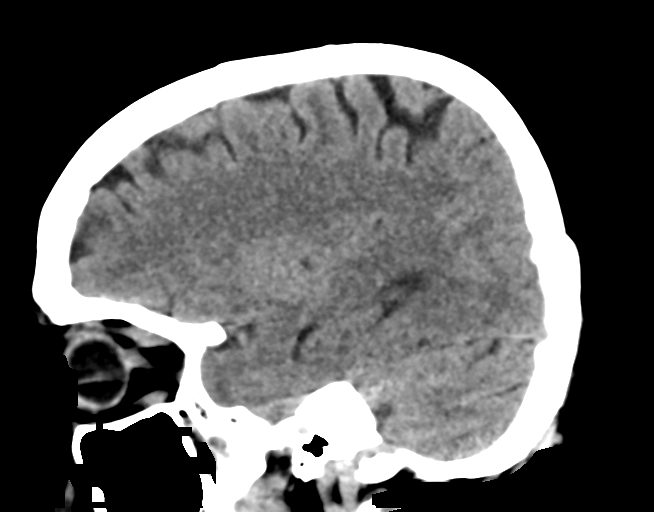
[im 27/54  brain]
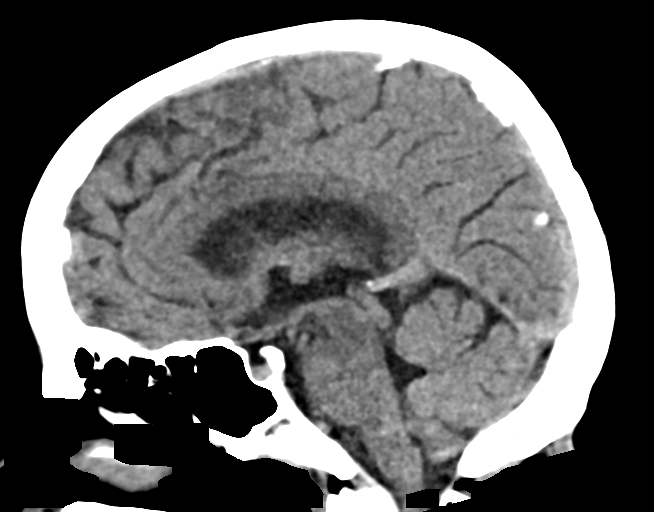
[im 36/54  brain]
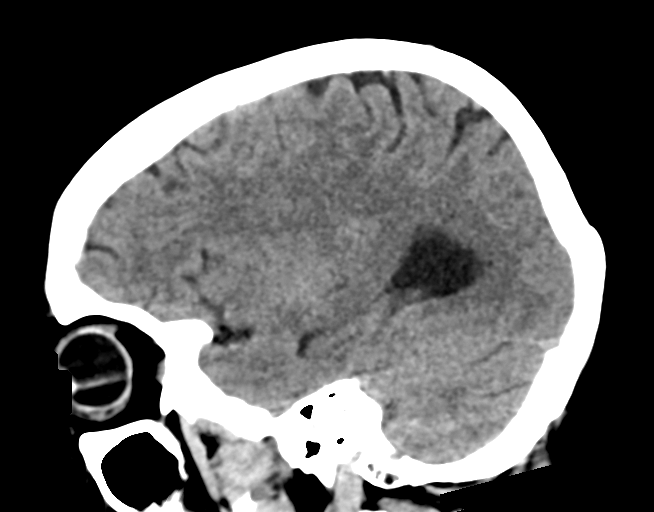

[17 of 47 positions shown; findings below may reference images not displayed]

FINDINGS: Brain: No evidence of acute infarction, hemorrhage, hydrocephalus,
extra-axial collection or mass lesion/mass effect.

Vascular: No hyperdense vessel or unexpected calcification.

Skull: Normal. Negative for fracture or focal lesion.

Sinuses/Orbits: No acute finding.

Other: None.
IMPRESSION: 1.  No acute intracranial abnormality.

## 2023-04-02 ENCOUNTER — Ambulatory Visit (INDEPENDENT_AMBULATORY_CARE_PROVIDER_SITE_OTHER): Payer: Medicare Other | Admitting: Orthopedic Surgery

## 2023-04-02 ENCOUNTER — Other Ambulatory Visit (INDEPENDENT_AMBULATORY_CARE_PROVIDER_SITE_OTHER): Payer: Self-pay

## 2023-04-02 DIAGNOSIS — Z981 Arthrodesis status: Secondary | ICD-10-CM

## 2023-04-02 DIAGNOSIS — G8929 Other chronic pain: Secondary | ICD-10-CM

## 2023-04-02 DIAGNOSIS — M25512 Pain in left shoulder: Secondary | ICD-10-CM

## 2023-04-02 MED ORDER — OXYCODONE HCL 5 MG PO TABS
5.0000 mg | ORAL_TABLET | Freq: Four times a day (QID) | ORAL | 0 refills | Status: AC | PRN
Start: 2023-04-02 — End: 2023-04-07

## 2023-04-02 MED ORDER — OXYCODONE HCL 5 MG PO TABS
5.0000 mg | ORAL_TABLET | Freq: Four times a day (QID) | ORAL | 0 refills | Status: DC | PRN
Start: 2023-04-02 — End: 2023-04-02

## 2023-04-02 NOTE — Progress Notes (Signed)
Orthopedic Surgery Post-operative Office Visit   Procedure: C3-5 ACDF Date of Surgery: 02/14/2023 (~6 weeks post-op)   Assessment: Patient is a 72 y.o. female who is still having significant shoulder pain and exam pointing towards shoulder as potential etiology     Plan: -Operative plans complete -Out of bed as tolerated, no collar needed -No bending/lifting/twisting greater than 10 pounds -Okay to submerge wound at this point -Pain management: oxycodone -We talked about injection into the left shoulder for her pain and there but she did not want to do that at this time -I provided her with a referral to pain management because she is requiring continued narcotic use for pain control as she is greater than 6 weeks out from surgery -Return to office in 6 weeks, x-rays needed at next visit: AP/lateral/flex/ex cervical   ___________________________________________________________________________     Subjective: Patient is at home. She drove herself into the office. Patient is still reporting pain around the left deltoid.  Pain in neck is getting better with time.  Not having as much pain radiating past the shoulder.  No pain rating into the right upper extremity.  Her hip pain has gotten better with time.  She is ambulating with a cane at this point.     Objective:   General: no acute distress, appropriate affect Neurologic: alert, answering questions appropriately, following commands Respiratory: unlabored breathing on room air Skin: incision is well healed with no erythema, induration, active/expressible drainage   MSK (spine):   -Strength exam                                                   Left                  Right Grip strength                5/5                  5/5 Interosseus                  5/5                  5/5 Wrist extension            5/5                  5/5 Wrist flexion                 5/5                  5/5 Elbow flexion                5/5                   5/5 Deltoid                          4+/5                4+/5   -Sensory exam                           Sensation intact to light touch in C5-T1 nerve distributions of bilateral upper extremities  Left shoulder exam: Pain with external rotation  past 70 degrees, positive Hawkins, pain with Jobe, negative drop arm sign, no weakness with external rotation with arm at side, negative belly press   Imaging: X-rays of the cervical spine from 04/02/2023 were independently reviewed and interpreted, showing C3-5 anterior instrumentation in appropriate position. There are interbody allograft spacers at C3/4 and C4/5 in good position. No lucency seen around the screws. No fracture or dislocation seen.     Patient name: Tiffany [name] Patient MRN: [id number] Date of visit: 04/02/23

## 2023-04-14 ENCOUNTER — Inpatient Hospital Stay (INDEPENDENT_AMBULATORY_CARE_PROVIDER_SITE_OTHER): Payer: Medicare Other

## 2023-04-14 DIAGNOSIS — I639 Cerebral infarction, unspecified: Secondary | ICD-10-CM

## 2023-04-14 LAB — CUP PACEART REMOTE DEVICE CHECK
Date Time Interrogation Session: 20241117231333
Implantable Pulse Generator Implant Date: 20220802

## 2023-04-15 ENCOUNTER — Telehealth: Payer: Self-pay | Admitting: Orthopedic Surgery

## 2023-04-15 MED ORDER — OXYCODONE HCL 5 MG PO TABS
2.5000 mg | ORAL_TABLET | ORAL | 0 refills | Status: AC | PRN
Start: 2023-04-15 — End: 2023-04-22

## 2023-04-15 NOTE — Telephone Encounter (Signed)
Pt called to request a medication refill on oxycodone.

## 2023-04-28 ENCOUNTER — Telehealth: Payer: Self-pay | Admitting: Orthopedic Surgery

## 2023-04-28 MED ORDER — OXYCODONE HCL 5 MG PO TABS
5.0000 mg | ORAL_TABLET | Freq: Four times a day (QID) | ORAL | 0 refills | Status: AC | PRN
Start: 2023-04-28 — End: 2023-05-03

## 2023-04-28 NOTE — Telephone Encounter (Signed)
I called and advised patient of Dr. Tawanna Sat message

## 2023-04-28 NOTE — Telephone Encounter (Signed)
Pt called requesting a refill of oxycodone. Please send to pharmacy on file. Pt phone number is [phone].

## 2023-05-07 NOTE — Progress Notes (Signed)
Carelink Summary Report / Loop Recorder 

## 2023-05-14 ENCOUNTER — Ambulatory Visit: Payer: Medicare Other | Admitting: Orthopedic Surgery

## 2023-05-14 ENCOUNTER — Other Ambulatory Visit (INDEPENDENT_AMBULATORY_CARE_PROVIDER_SITE_OTHER): Payer: Self-pay

## 2023-05-14 DIAGNOSIS — M25512 Pain in left shoulder: Secondary | ICD-10-CM

## 2023-05-14 DIAGNOSIS — M25551 Pain in right hip: Secondary | ICD-10-CM | POA: Diagnosis not present

## 2023-05-14 DIAGNOSIS — Z981 Arthrodesis status: Secondary | ICD-10-CM

## 2023-05-14 NOTE — Progress Notes (Signed)
Orthopedic Surgery Post-operative Office Visit   Procedure: C3-5 ACDF Date of Surgery: 02/14/2023 (~3 months post-op)   Assessment: Patient is a 72 y.o. female who is still having left shoulder pain and exam pointing towards shoulder as potential etiology. She also is reporting right hip pain     Plan: -Operative plans complete -No spine specific precautions -Patient has pain in multiple areas and is requiring narcotics. Have referred her to pain management and she has an upcoming appt with them -She has shoulder pain and exam points towards shoulder as etiology. She has an MRI that shows rotator cuff pathology. I offered her a referral to see one of my partners for operative consultation but she was not interested at this time -Patient has pain with hip motion and with palpation over the trochanteric bursa. To help with diagnostic work up, recommended right hip injection -Return to office in 3 months, x-rays needed at next visit: AP/lateral/flex/ex cervical   ___________________________________________________________________________     Subjective: Patient has had improvement in her radiating arm pain since surgery. She still has some neck pain. She is complaining of left shoulder pain. She says she has trouble putting on a bra or washing her hair due to her shoulder having pain when she does these activities. She does not have any pain in her shoulder at rest. She also is complaining of right groin and right lateral hip pain. There was no trauma or injury that preceded the onset of her hip pain. She feels it with weight bearing and it improves with rest.      Objective:   General: no acute distress, appropriate affect Neurologic: alert, answering questions appropriately, following commands Respiratory: unlabored breathing on room air Skin: incision is well healed   MSK (spine):   -Strength exam                                                   Left                  Right Grip  strength                5/5                  5/5 Interosseus                  5/5                  5/5 Wrist extension            5/5                  5/5 Wrist flexion                 5/5                  5/5 Elbow flexion                5/5                  5/5 Deltoid                          4+/5                4+/5   -Sensory  exam                           Sensation intact to light touch in C5-T1 nerve distributions of bilateral upper extremities   Left shoulder exam: pain with jobe test, pain with external rotation past 70 degrees, positive hawkins, no weakness with external rotation with arm at side, negative belly   Right hip pain: positive FADIR, positive stinchfield, TTP over the trochanteric burst but no other tenderness to palpation over hte hip.    Imaging: X-rays of the cervical spine from 05/14/2023 were independently reviewed and interpreted, showing C3-5 anterior instrumentation in appropriate position. Interbody allograft spacers are seen at C3/4 and C4/5. Both appears in satisfactory position. No fracture or dislocation seen. No translation seen on flexion/extension views.      Patient name: Mekenzi Mleczko Patient MRN: [id number] Date of visit: 05/14/23

## 2023-05-19 ENCOUNTER — Inpatient Hospital Stay (INDEPENDENT_AMBULATORY_CARE_PROVIDER_SITE_OTHER): Payer: Medicare Other

## 2023-05-19 DIAGNOSIS — I639 Cerebral infarction, unspecified: Secondary | ICD-10-CM

## 2023-05-19 LAB — CUP PACEART REMOTE DEVICE CHECK
Date Time Interrogation Session: 20241222230952
Implantable Pulse Generator Implant Date: 20220802

## 2023-06-02 ENCOUNTER — Other Ambulatory Visit: Payer: Self-pay | Admitting: Internal Medicine

## 2023-06-03 ENCOUNTER — Encounter: Payer: Medicare Other | Admitting: Sports Medicine

## 2023-06-05 ENCOUNTER — Encounter: Payer: Medicare Other | Admitting: Sports Medicine

## 2023-06-20 ENCOUNTER — Telehealth: Payer: Self-pay | Admitting: Internal Medicine

## 2023-06-20 DIAGNOSIS — E1165 Type 2 diabetes mellitus with hyperglycemia: Secondary | ICD-10-CM

## 2023-06-20 NOTE — Addendum Note (Signed)
Addended by: Corwin Levins on: 06/20/2023 05:17 PM   Modules accepted: Orders

## 2023-06-20 NOTE — Telephone Encounter (Signed)
Ok this is done

## 2023-06-20 NOTE — Telephone Encounter (Signed)
Copied from CRM [id number]. Topic: Referral - Request for Referral >> Jun 20, 2023  2:35 PM Corin V wrote: Did the patient discuss referral with their provider in the last year? No (If No - schedule appointment) (If Yes - send message)  Appointment offered? Yes  Type of order/referral and detailed reason for visit: Diabetic Opthalmologist  Preference of office, provider, location: [facility]  If referral order, have you been seen by this specialty before? No (If Yes, this issue or another issue? When? Where?  Can we respond through MyChart? No

## 2023-06-23 ENCOUNTER — Encounter: Payer: Self-pay | Admitting: Cardiovascular Disease

## 2023-06-23 ENCOUNTER — Inpatient Hospital Stay (INDEPENDENT_AMBULATORY_CARE_PROVIDER_SITE_OTHER): Payer: Medicare Other

## 2023-06-23 DIAGNOSIS — I639 Cerebral infarction, unspecified: Secondary | ICD-10-CM | POA: Diagnosis not present

## 2023-06-23 LAB — CUP PACEART REMOTE DEVICE CHECK
Date Time Interrogation Session: 20250126230836
Implantable Pulse Generator Implant Date: 20220802

## 2023-06-25 NOTE — Progress Notes (Signed)
[facility] Summary Report / Loop Recorder

## 2023-06-27 ENCOUNTER — Other Ambulatory Visit: Payer: Self-pay | Admitting: Internal Medicine

## 2023-07-03 ENCOUNTER — Other Ambulatory Visit: Payer: Self-pay

## 2023-07-03 ENCOUNTER — Telehealth: Payer: Self-pay | Admitting: Internal Medicine

## 2023-07-03 MED ORDER — GABAPENTIN 300 MG PO CAPS: 900.0000 mg | ORAL_CAPSULE | Freq: Three times a day (TID) | ORAL | 5 refills | Status: DC

## 2023-07-03 NOTE — Telephone Encounter (Signed)
 Refill sent.

## 2023-07-03 NOTE — Telephone Encounter (Signed)
 Copied from CRM [id number]. Topic: Clinical - Medication Refill >> Jul 03, 2023  2:28 PM Deidre DASEN wrote: Most Recent Primary Care Visit:  Provider: [facility]  Department: [facility] [place]  Visit Type: SAME DAY  Date: 10/17/2022  Medication: gabapentin  (NEURONTIN ) 300 MG   Has the patient contacted their pharmacy? No (Agent: If no, request that the patient contact the pharmacy for the refill. If patient does not wish to contact the pharmacy document the reason why and proceed with request.) (Agent: If yes, when and what did the pharmacy advise?)  Is this the correct pharmacy for this prescription? Yes If no, delete pharmacy and type the correct one.  This is the patient's preferred pharmacy:  [facility] [address], [place] - [address] [place] [address] [address] [address] Phone: [phone] Fax: [phone]   Has the prescription been filled recently? No  Is the patient out of the medication? Yes  Has the patient been seen for an appointment in the last year OR does the patient have an upcoming appointment? Yes  Can we respond through MyChart? No  Agent: Please be advised that Rx refills may take up to 3 business days. We ask that you follow-up with your pharmacy.

## 2023-07-10 ENCOUNTER — Encounter: Payer: Self-pay | Admitting: Internal Medicine

## 2023-07-10 ENCOUNTER — Ambulatory Visit: Payer: Medicare Other | Admitting: Internal Medicine

## 2023-07-10 ENCOUNTER — Other Ambulatory Visit: Payer: Self-pay | Admitting: Internal Medicine

## 2023-07-10 VITALS — BP 140/78 | HR 93 | Temp 98.3°F | Ht 62.0 in | Wt 160.0 lb

## 2023-07-10 DIAGNOSIS — N644 Mastodynia: Secondary | ICD-10-CM

## 2023-07-10 DIAGNOSIS — E1165 Type 2 diabetes mellitus with hyperglycemia: Secondary | ICD-10-CM

## 2023-07-10 DIAGNOSIS — Z794 Long term (current) use of insulin: Secondary | ICD-10-CM

## 2023-07-10 DIAGNOSIS — E538 Deficiency of other specified B group vitamins: Secondary | ICD-10-CM | POA: Diagnosis not present

## 2023-07-10 DIAGNOSIS — Z0001 Encounter for general adult medical examination with abnormal findings: Secondary | ICD-10-CM | POA: Diagnosis not present

## 2023-07-10 DIAGNOSIS — Z23 Encounter for immunization: Secondary | ICD-10-CM | POA: Diagnosis not present

## 2023-07-10 DIAGNOSIS — L732 Hidradenitis suppurativa: Secondary | ICD-10-CM

## 2023-07-10 DIAGNOSIS — E559 Vitamin D deficiency, unspecified: Secondary | ICD-10-CM

## 2023-07-10 MED ORDER — FAMOTIDINE 20 MG PO TABS
20.0000 mg | ORAL_TABLET | Freq: Two times a day (BID) | ORAL | 3 refills | Status: AC
Start: 2023-07-10 — End: ?

## 2023-07-10 MED ORDER — PRAVASTATIN SODIUM 80 MG PO TABS: 80.0000 mg | ORAL_TABLET | Freq: Every day | ORAL | 3 refills | Status: DC

## 2023-07-10 MED ORDER — PANTOPRAZOLE SODIUM 40 MG PO TBEC
40.0000 mg | DELAYED_RELEASE_TABLET | Freq: Every day | ORAL | 3 refills | Status: DC
Start: 2023-07-10 — End: 2023-11-13

## 2023-07-10 MED ORDER — DOXYCYCLINE HYCLATE 100 MG PO TABS: 100.0000 mg | ORAL_TABLET | Freq: Two times a day (BID) | ORAL | 0 refills | Status: DC

## 2023-07-10 MED ORDER — VENTOLIN HFA 108 (90 BASE) MCG/ACT IN AERS: 2.0000 | INHALATION_SPRAY | Freq: Four times a day (QID) | RESPIRATORY_TRACT | 2 refills | Status: AC | PRN

## 2023-07-10 MED ORDER — AMLODIPINE BESYLATE 5 MG PO TABS: 5.0000 mg | ORAL_TABLET | Freq: Every day | ORAL | 3 refills | Status: DC

## 2023-07-10 MED ORDER — ZOLPIDEM TARTRATE 10 MG PO TABS: 10.0000 mg | ORAL_TABLET | Freq: Every evening | ORAL | 1 refills | Status: DC | PRN

## 2023-07-10 MED ORDER — LANTUS SOLOSTAR 100 UNIT/ML ~~LOC~~ SOPN: 45.0000 [IU] | PEN_INJECTOR | Freq: Every day | SUBCUTANEOUS | 11 refills | Status: DC

## 2023-07-10 MED ORDER — LOSARTAN POTASSIUM 50 MG PO TABS: 50.0000 mg | ORAL_TABLET | Freq: Every day | ORAL | 3 refills | Status: AC

## 2023-07-10 MED ORDER — GABAPENTIN 300 MG PO CAPS: 900.0000 mg | ORAL_CAPSULE | Freq: Three times a day (TID) | ORAL | 5 refills | Status: AC

## 2023-07-10 MED ORDER — EZETIMIBE 10 MG PO TABS: 10.0000 mg | ORAL_TABLET | Freq: Every day | ORAL | 3 refills | Status: DC

## 2023-07-10 NOTE — Patient Instructions (Addendum)
 You had the flu shot today  Please take all new medication as prescribed - the antibiotic  Please continue all other medications as before, and refills have been done if requested.  Please have the pharmacy call with any other refills you may need.  Please continue your efforts at being more active, low cholesterol diet, and weight control.  You are otherwise up to date with prevention measures today.  Please keep your appointments with your specialists as you may have planned  You will be contacted regarding the referral for: Diagnostic mammogram  We can hold on further lab testing today  Please make an Appointment to return in 6 months, or sooner if needed

## 2023-07-10 NOTE — Progress Notes (Signed)
 Chief Complaint:: wellness exam and Medical Management of Chronic Issues (Eye problems and several other concerns, has been having some dizziness lately after taking her BP med and zetia and says BP has been running high during the day , says her lymph node feel they are swollen above her breast and on the sides for the past week and sore to touch )  , dm, dizzy, htn, breast pain bilateral       HPI:  Tiffany [name] is a 73 y.o. female here for wellness exam; needs routine DM eye exam in July 2025 for a 1 yr f/u with Dr Dione Booze;  for shingrix at pharmacy, declines covid booster and colonoscopy, for flu shot today, o/w up to date                  Also s/p c spine surgury 2024, may need left shoulder rot cuff as well at some point. Has only taken 50 mg losartan to date - needs rx corrected here. Pt denies chest pain, increased sob or doe, wheezing, orthopnea, PND, increased LE swelling, palpitations, dizziness or syncope.   Pt denies polydipsia, polyuria, or new focal neuro s/s.    Pt denies fever, wt loss, night sweats, loss of appetite, or other constitutional symptoms  has vague bilatearl breast pains, and axillary tender LN's for over 1 wk.  Has mild dizzy vertigo    Wt Readings from Last 3 Encounters:  07/10/23 160 lb (72.6 kg)  03/10/23 161 lb 13.1 oz (73.4 kg)  03/04/23 161 lb 13.1 oz (73.4 kg)   BP Readings from Last 3 Encounters:  07/10/23 (!) 140/78  03/10/23 (!) 156/76  03/04/23 111/65   Immunization History  Administered Date(s) Administered   Fluad Quad(high Dose 65+) 03/03/2019, 05/02/2020   Fluad Trivalent(High Dose 65+) 07/10/2023   Influenza Split 06/08/2012   Influenza Whole 05/25/2007, 03/17/2008, 03/27/2009   Influenza, High Dose Seasonal PF 03/10/2017, 03/15/2019   Influenza,inj,Quad PF,6+ Mos 02/07/2015, 03/06/2016   PFIZER(Purple Top)SARS-COV-2 Vaccination 08/09/2019, 08/31/2019, 05/23/2020   Pneumococcal Conjugate-13 05/14/2018, 03/03/2019   Pneumococcal  Polysaccharide-23 06/27/2004, 03/06/2016, 09/14/2021   Td 09/26/2008   Tdap 05/03/2019   Zoster, Live 02/14/2014   Health Maintenance Due  Topic Date Due   Zoster Vaccines- Shingrix (1 of 2) 07/10/2001   MAMMOGRAM  05/02/2022   HEMOGLOBIN A1C  05/06/2023   Medicare Annual Wellness (AWV)  08/09/2023      Past Medical History:  Diagnosis Date   Allergy    Chronic kidney disease    Degenerative joint disease    Diabetes mellitus    GERD (gastroesophageal reflux disease)    Headache    Hyperlipidemia    Hypertension    Stroke [facility])    Past Surgical History:  Procedure Laterality Date   ABDOMINAL HYSTERECTOMY  1976   ANTERIOR CERVICAL DECOMP/DISCECTOMY FUSION N/A 02/14/2023   Procedure: CERVICAL THREE-FIVE ANTERIOR CERVICAL DECOMPRESSION/DISCECTOMY FUSION;  Surgeon: London Sheer, MD;  Location: [facility] OR;  Service: Orthopedics;  Laterality: N/A;   BACK SURGERY     KNEE ARTHROSCOPY     left x 2   LUMBAR LAMINECTOMY  1989    reports that she quit smoking about 32 years ago. Her smoking use included cigarettes. She started smoking about 47 years ago. She has a 7.5 pack-year smoking history. She has never used smokeless tobacco. She reports that she does not drink alcohol and does not use drugs. family history includes Diabetes in her  mother and sister; Heart disease in her brother and mother; Hypertension in her brother and mother; Kidney disease in her mother; Stroke in her brother; Thyroid disease in her mother. Allergies  Allergen Reactions   Contrast Media [Iodinated Contrast Media] Anaphylaxis, Nausea And Vomiting, Swelling and Other (See Comments)    Patients throat closes with nausea and vomiting    Aspirin Nausea And Vomiting and Other (See Comments)    Can take EC aspirin   Gadolinium Derivatives Nausea And Vomiting   Metformin And Related Nausea And Vomiting and Other (See Comments)    Severe nausea and vomiting   Shellfish Allergy Nausea And Vomiting   Tramadol  Nausea And Vomiting and Other (See Comments)    Migraines   Penicillins Nausea And Vomiting   Statins Other (See Comments)    Myalgias Can take Pravastatin    Current Outpatient Medications on File Prior to Visit  Medication Sig Dispense Refill   Accu-Chek Softclix Lancets lancets USE   TO CHECK GLUCOSE TWICE DAILY 200 each 11   acetaminophen (TYLENOL) 500 MG tablet Take 2 tablets (1,000 mg total) by mouth every 8 (eight) hours. 30 tablet 0   aspirin EC 325 MG EC tablet Take 1 tablet (325 mg total) by mouth daily. 30 tablet 0   Blood Glucose Monitoring Suppl (ONE TOUCH ULTRA 2) w/Device KIT Apply 1 Device topically in the morning and at bedtime. E11.9 1 kit 0   cetirizine (ZYRTEC) 5 MG tablet Take 1 tablet (5 mg total) by mouth daily. (Patient taking differently: Take 5 mg by mouth daily as needed for allergies.) 15 tablet 0   cyclobenzaprine (FLEXERIL) 10 MG tablet Take 1 tablet (10 mg total) by mouth 2 (two) times daily as needed for muscle spasms. 10 tablet 0   EPINEPHRINE 0.3 mg/0.3 mL IJ SOAJ injection INJECT 0.3 MG INTO THE MUSCLE AS NEEDED FOR ANAPHYLAXIS 2 each 0   glucose blood (ONETOUCH ULTRA) test strip Use as instructed four times per day E11.9 400 each 3   HYDROcodone-acetaminophen (NORCO) 10-325 MG tablet SMARTSIG:0.5-1 Tablet(s) By Mouth 1-3 Times Daily     Insulin Pen Needle 32G X 4 MM MISC Use to inject  lantus once daily 100 each 4   phenol (CHLORASEPTIC) 1.4 % LIQD Use as directed 1 spray in the mouth or throat as needed for throat irritation / pain. 177 mL 0   sodium chloride (OCEAN) 0.65 % SOLN nasal spray Place 1 spray into both nostrils as needed for congestion.     triamcinolone (NASACORT) 55 MCG/ACT AERO nasal inhaler Place 2 sprays into the nose daily. (Patient taking differently: Place 2 sprays into the nose daily as needed (allergies).) 3 each 3   No current facility-administered medications on file prior to visit.        ROS:  All others reviewed and  negative.  Objective        PE:  BP (!) 140/78 (BP Location: Right Arm, Patient Position: Sitting, Cuff Size: Normal)   Pulse 93   Temp 98.3 F (36.8 C) (Oral)   Ht 5\' 2"  (1.575 m)   Wt 160 lb (72.6 kg)   SpO2 100%   BMI 29.26 kg/m                 Constitutional: Pt appears in NAD               HENT: Head: NCAT.  Right Ear: External ear normal.                 Left Ear: External ear normal.                Eyes: . Pupils are equal, round, and reactive to light. Conjunctivae and EOM are normal               Nose: without d/c or deformity               Neck: Neck supple. Gross normal ROM               Cardiovascular: Normal rate and regular rhythm.                 Pulmonary/Chest: Effort normal and breath sounds without rales or wheezing.                Abd:  Soft, NT, ND, + BS, no organomegaly               Neurological: Pt is alert. At baseline orientation, motor grossly intact               Skin: Skin is warm. No rashes, no other new lesions, LE edema - none               Psychiatric: Pt behavior is normal without agitation   Micro: none  Cardiac tracings I have personally interpreted today:  none  Pertinent Radiological findings (summarize): none   Lab Results  Component Value Date   WBC 6.7 03/10/2023   HGB 12.3 03/10/2023   HCT 37.2 03/10/2023   PLT 311 03/10/2023   GLUCOSE 197 (H) 03/10/2023   CHOL 144 10/17/2022   TRIG 41.0 10/17/2022   HDL 51.40 10/17/2022   LDLCALC 84 10/17/2022   ALT 15 10/17/2022   AST 17 10/17/2022   NA 133 (L) 03/10/2023   K 3.8 03/10/2023   CL 98 03/10/2023   CREATININE 0.76 03/10/2023   BUN 13 03/10/2023   CO2 24 03/10/2023   TSH 1.91 10/17/2022   INR 1.0 10/06/2020   HGBA1C 6.3 (H) 02/04/2023   MICROALBUR 1.1 10/17/2022   Assessment/Plan:  Tiffany [name] is a 73 y.o. Black or African American [2] female with  has a past medical history of Allergy, Chronic kidney disease, Degenerative joint disease, Diabetes  mellitus, GERD (gastroesophageal reflux disease), Headache, Hyperlipidemia, Hypertension, and Stroke (HCC).  Encounter for well adult exam with abnormal findings Age and sex appropriate education and counseling updated with regular exercise and diet Referrals for preventative services - declines colonoscopy, for eye exam July 2025 Immunizations addressed - for flu shot today, for shingrix at pharmacy, declines covid booster Smoking counseling  - none needed Evidence for depression or other mood disorder - none significant Most recent labs reviewed. I have personally reviewed and have noted: 1) the patient's medical and social history 2) The patient's current medications and supplements 3) The patient's height, weight, and BMI have been recorded in the chart   Breast pain in female For diagnostic mamogram  Hidradenitis Mild to mod, for antibx course doxy 100 bid,,  to f/u any worsening symptoms or concerns  Type 2 diabetes mellitus with hyperglycemia, with long-term current use of insulin (HCC) Lab Results  Component Value Date   HGBA1C 6.3 (H) 02/04/2023   Stable, pt to continue current medical treatment lantus 45 u qd   Vitamin D deficiency Last vitamin D Lab Results  Component  Value Date   VD25OH 50.60 10/17/2022   Stable, cont oral replacement  Followup: Return in about 6 months (around 01/07/2024).  Oliver Barre, MD 07/12/2023 8:37 PM [facility]

## 2023-07-12 ENCOUNTER — Encounter: Payer: Self-pay | Admitting: Internal Medicine

## 2023-07-12 DIAGNOSIS — L732 Hidradenitis suppurativa: Secondary | ICD-10-CM | POA: Insufficient documentation

## 2023-07-12 DIAGNOSIS — N644 Mastodynia: Secondary | ICD-10-CM | POA: Insufficient documentation

## 2023-07-12 NOTE — Assessment & Plan Note (Signed)
Mild to mod, for antibx course doxy 100 bid,,  to f/u any worsening symptoms or concerns

## 2023-07-12 NOTE — Assessment & Plan Note (Signed)
 Age and sex appropriate education and counseling updated with regular exercise and diet Referrals for preventative services - declines colonoscopy, for eye exam July 2025 Immunizations addressed - for flu shot today, for shingrix at pharmacy, declines covid booster Smoking counseling  - none needed Evidence for depression or other mood disorder - none significant Most recent labs reviewed. I have personally reviewed and have noted: 1) the patient's medical and social history 2) The patient's current medications and supplements 3) The patient's height, weight, and BMI have been recorded in the chart

## 2023-07-12 NOTE — Assessment & Plan Note (Signed)
 Lab Results  Component Value Date   HGBA1C 6.3 (H) 02/04/2023   Stable, pt to continue current medical treatment lantus 45 u qd

## 2023-07-12 NOTE — Assessment & Plan Note (Signed)
 For diagnostic mamogram

## 2023-07-12 NOTE — Assessment & Plan Note (Signed)
Last vitamin D Lab Results  Component Value Date   VD25OH 50.60 10/17/2022   Stable, cont oral replacement

## 2023-07-28 ENCOUNTER — Ambulatory Visit: Payer: Medicare Other

## 2023-07-28 DIAGNOSIS — I639 Cerebral infarction, unspecified: Secondary | ICD-10-CM

## 2023-07-29 LAB — CUP PACEART REMOTE DEVICE CHECK
Date Time Interrogation Session: 20250302230609
Implantable Pulse Generator Implant Date: 20220802

## 2023-07-31 NOTE — Progress Notes (Signed)
 [facility] Summary Report / Loop Recorder

## 2023-08-13 ENCOUNTER — Other Ambulatory Visit (INDEPENDENT_AMBULATORY_CARE_PROVIDER_SITE_OTHER): Payer: Self-pay

## 2023-08-13 ENCOUNTER — Ambulatory Visit: Payer: Medicare Other | Admitting: Orthopedic Surgery

## 2023-08-13 DIAGNOSIS — Z981 Arthrodesis status: Secondary | ICD-10-CM

## 2023-08-13 DIAGNOSIS — M25562 Pain in left knee: Secondary | ICD-10-CM | POA: Diagnosis not present

## 2023-08-13 DIAGNOSIS — M23322 Other meniscus derangements, posterior horn of medial meniscus, left knee: Secondary | ICD-10-CM | POA: Diagnosis not present

## 2023-08-13 NOTE — Progress Notes (Signed)
 Orthopedic Surgery Post-operative Office Visit   Procedure: C3-5 ACDF Date of Surgery: 02/14/2023 (~6 months post-op)   Assessment: Patient is a 73 y.o. female who has some left shoulder pain especially with overhead activity that seems to be coming from the shoulder. She also is presenting with left knee pain - possible meniscus injury     Plan: -No spine specific precautions -Patient has been having her leg gave out and has been falling as a result of her knee, so ordered an MRI to evaluate for meniscus tear -Performed a left knee injection today in the office (see procedure note below).  Can continue with Tylenol up to 1000 mg 3 times daily for pain relief. She can continue with the knee brace as well. -I did tell her that I see some subtle lucency around the C3 screws that we will need to keep an eye on.  She is still less than a year out from surgery so there is opportunity for further healing and fusion -Return to office in 6 months, x-rays needed at next visit: AP/lateral/flex/ex cervical -I will call her with the results of the MRI to discuss next steps in treatment   Left knee injection note: After discussing the risks, benefits, alternatives of left knee intra-articular injection, patient elected to proceed.  The patient was in the seated position with the knee at 90 degrees.  The anterior lateral soft spot was prepped with an alcohol based prep.  Ethyl chloride was used to anesthetize the skin.  A 20-gauge needle was used to inject 1 cc of lidocaine, 1 cc of bupivacaine, 1 cc of Depo-Medrol under standard sterile technique.  Needle was withdrawn and Band-Aid was applied.  Patient tolerated the procedure well.   ___________________________________________________________________________     Subjective: Patient still has some neck stiffness but is otherwise doing well since surgery.  She still has some pain in her left shoulder that she notes mostly with overhead activity.  She gives  the example of washing her hair.  She also says she has difficulty with putting on a bra.  She is not having any other pain in her upper extremities.  Denies paresthesias and numbness.  Has not noticed any redness or drainage around her incision.  Patient also wanted talk about her left knee.  She said she had a twisting injury about 2 weeks ago.  She noted acute onset of left knee pain.  She feels on the medial aspect of the knee.  She said the knee has been locking on her and causing her to fall.  She has been using a knee brace to try to give her more stability.  She has been taking Tylenol for pain control.  She has not noticed any improvement in her pain over time.     Objective:   General: no acute distress, appropriate affect Neurologic: alert, answering questions appropriately, following commands Respiratory: unlabored breathing on room air Skin: incision is well healed   MSK (spine):   -Strength exam                                                   Left                  Right Grip strength  5/5                  5/5 Interosseus                  5/5                  5/5 Wrist extension            5/5                  5/5 Wrist flexion                 5/5                  5/5 Elbow flexion                5/5                  5/5 Deltoid                          4+/5                4+/5   -Sensory exam                           Sensation intact to light touch in C5-T1 nerve distributions of bilateral upper extremities   Left knee exam: TTP over the medial joint line otherwise nontender to palpation over the remainder of the knee, knee stable to varus and valgus stress, negative Lachman, negative posterior drawer   Imaging: XRs of the cervical spine from 08/13/2023 independent reviewed and interpreted, showing C3-5 anterior instrumentation in satisfactory position.  There is a subtle lucency around the C3 screws.  There are allograft interbody spacers at C3/4 and C4/5  that appear in appropriate position.  No fracture or dislocation seen.  No translation seen on the flexion/extension views.  XRs of the left knee from 08/13/2023 were independently reviewed and interpreted, showing osteophyte formation seen in the lateral patellofemoral joint.  Joint space narrowing and subchondral sclerosis in the lateral compartment.  No other significant degenerative changes.  No fracture or dislocation seen.    Patient name: Tiffany [name] Patient MRN: [id number] Date of visit: 08/13/23

## 2023-08-15 ENCOUNTER — Ambulatory Visit
Admission: RE | Admit: 2023-08-15 | Discharge: 2023-08-15 | Disposition: A | Discharge status: Home / Self Care | Payer: Medicare Other | Source: Ambulatory Visit | Attending: Internal Medicine | Admitting: Internal Medicine

## 2023-08-15 ENCOUNTER — Ambulatory Visit: Payer: Medicare Other

## 2023-08-15 DIAGNOSIS — N644 Mastodynia: Secondary | ICD-10-CM

## 2023-08-20 ENCOUNTER — Other Ambulatory Visit: Payer: Self-pay | Admitting: Internal Medicine

## 2023-08-20 ENCOUNTER — Other Ambulatory Visit: Payer: Self-pay

## 2023-08-26 ENCOUNTER — Encounter: Payer: Self-pay | Admitting: Orthopedic Surgery

## 2023-08-28 NOTE — Progress Notes (Signed)
 [facility] Summary Report / Loop Recorder

## 2023-08-28 NOTE — Addendum Note (Signed)
 Addended by: Geralyn Flash D on: 08/28/2023 01:23 PM   Modules accepted: Orders

## 2023-08-29 ENCOUNTER — Ambulatory Visit
Admission: RE | Admit: 2023-08-29 | Discharge: 2023-08-29 | Disposition: A | Discharge status: Home / Self Care | Source: Ambulatory Visit | Attending: Orthopedic Surgery | Admitting: Orthopedic Surgery

## 2023-08-29 DIAGNOSIS — M25562 Pain in left knee: Secondary | ICD-10-CM

## 2023-09-01 ENCOUNTER — Inpatient Hospital Stay (INDEPENDENT_AMBULATORY_CARE_PROVIDER_SITE_OTHER): Payer: Medicare Other

## 2023-09-01 DIAGNOSIS — I639 Cerebral infarction, unspecified: Secondary | ICD-10-CM

## 2023-09-02 LAB — CUP PACEART REMOTE DEVICE CHECK
Date Time Interrogation Session: 20250406230706
Implantable Pulse Generator Implant Date: 20220802

## 2023-09-22 ENCOUNTER — Telehealth: Payer: Self-pay | Admitting: Orthopedic Surgery

## 2023-09-22 NOTE — Telephone Encounter (Signed)
 Orthopedic Telephone Note  Spoke to patient on the phone this afternoon about her knee.  She says that her knee pain has gotten significantly better since she was seen in the office.  She still notices some soreness in the knee.  It is in the same area as before.  I discussed the MRI with her and went over the findings.  Since her knee is doing relatively well right now with only occasional soreness and it, I did not recommend further treatment at this time.  I told her that we can do periodic injections.  If her pain becomes refractory to conservative treatment, I told her I would refer her to one of my arthroplasty colleagues to talk about joint replacement as an option.  She expressed understanding and I answered all her questions about the MRI and her knee to her satisfaction.  I will see her back in the office on an as-needed basis for her knee.  We have regularly scheduled follow-up for her cervical spine.   Diedra Fowler, MD Orthopedic Surgeon

## 2023-10-06 ENCOUNTER — Inpatient Hospital Stay: Payer: Medicare Other

## 2023-10-06 DIAGNOSIS — I639 Cerebral infarction, unspecified: Secondary | ICD-10-CM

## 2023-10-06 LAB — CUP PACEART REMOTE DEVICE CHECK
Date Time Interrogation Session: 20250511233133
Implantable Pulse Generator Implant Date: 20220802

## 2023-10-07 ENCOUNTER — Ambulatory Visit: Payer: Self-pay | Admitting: Cardiovascular Disease

## 2023-10-14 NOTE — Progress Notes (Signed)
 [facility] Summary Report / Loop Recorder

## 2023-10-14 NOTE — Addendum Note (Signed)
 Addended by: Edra Govern D on: 10/14/2023 03:16 PM   Modules accepted: Orders

## 2023-10-22 ENCOUNTER — Ambulatory Visit (INDEPENDENT_AMBULATORY_CARE_PROVIDER_SITE_OTHER): Admitting: Internal Medicine

## 2023-10-22 ENCOUNTER — Encounter: Payer: Self-pay | Admitting: Internal Medicine

## 2023-10-22 VITALS — BP 130/76 | HR 78 | Temp 98.0°F | Ht 62.0 in | Wt 166.0 lb

## 2023-10-22 DIAGNOSIS — E559 Vitamin D deficiency, unspecified: Secondary | ICD-10-CM | POA: Diagnosis not present

## 2023-10-22 DIAGNOSIS — E538 Deficiency of other specified B group vitamins: Secondary | ICD-10-CM

## 2023-10-22 DIAGNOSIS — R600 Localized edema: Secondary | ICD-10-CM | POA: Insufficient documentation

## 2023-10-22 DIAGNOSIS — I10 Essential (primary) hypertension: Secondary | ICD-10-CM

## 2023-10-22 DIAGNOSIS — J069 Acute upper respiratory infection, unspecified: Secondary | ICD-10-CM

## 2023-10-22 DIAGNOSIS — E1165 Type 2 diabetes mellitus with hyperglycemia: Secondary | ICD-10-CM

## 2023-10-22 DIAGNOSIS — Z794 Long term (current) use of insulin: Secondary | ICD-10-CM

## 2023-10-22 MED ORDER — AZITHROMYCIN 250 MG PO TABS: ORAL_TABLET | ORAL | 1 refills | Status: AC

## 2023-10-22 MED ORDER — HYDROCHLOROTHIAZIDE 12.5 MG PO CAPS: 12.5000 mg | ORAL_CAPSULE | Freq: Every day | ORAL | 3 refills | Status: AC

## 2023-10-22 MED ORDER — EPINEPHRINE 0.3 MG/0.3ML IJ SOAJ: 0.3000 mg | INTRAMUSCULAR | 0 refills | Status: AC | PRN

## 2023-10-22 NOTE — Assessment & Plan Note (Signed)
 Lab Results  Component Value Date   HGBA1C 6.3 (H) 02/04/2023   Stable, pt to continue current medical treatment lantus 45 u qd

## 2023-10-22 NOTE — Patient Instructions (Signed)
 Please take all new medication as prescribed - the antibiotic, and mild fluid pill  Please continue all other medications as before, and refills have been done if requested.  Please have the pharmacy call with any other refills you may need.  Please keep your appointments with your specialists as you may have planned

## 2023-10-22 NOTE — Assessment & Plan Note (Signed)
 BP Readings from Last 3 Encounters:  10/22/23 130/76  07/10/23 (!) 140/78  03/10/23 (!) 156/76   Stable, pt to continue medical treatment norvasc  5 mg every day, losartan  50 mg qd

## 2023-10-22 NOTE — Assessment & Plan Note (Signed)
 Lab Results  Component Value Date   VITAMINB12 >1500 (H) 10/17/2022   Stable, cont oral replacement - b12 1000 mcg qd

## 2023-10-22 NOTE — Assessment & Plan Note (Signed)
Mild to mod, for antibx course zpack, cough med prn,,  to f/u any worsening symptoms or concerns 

## 2023-10-22 NOTE — Assessment & Plan Note (Signed)
 Mild likley venous insufficiency, for low salt diet, leg elevation, compression stockings, hct 12.5 every day,  to f/u any worsening symptoms or concerns

## 2023-10-22 NOTE — Assessment & Plan Note (Signed)
Last vitamin D Lab Results  Component Value Date   VD25OH 50.60 10/17/2022   Stable, cont oral replacement

## 2023-10-22 NOTE — Progress Notes (Signed)
 Patient ID: Tiffany [name], female   DOB: 1950/10/23, 73 y.o.   MRN: [id number]        Chief Complaint: follow up with bilateral leg swelling left > right, htn, URI symptoms       HPI:  Tiffany [name] is a 73 y.o. female here with c/o  Here with 2-3 days acute onset fever, facial pain, pressure, headache, general weakness and malaise, and greenish d/c, with mild ST and cough, but pt denies chest pain, wheezing, increased sob or doe, orthopnea, PND, palpitations, dizziness or syncope but has had 2 wks also mild worsening bilateral leg swelling without pain, that tends to go away with leg elevation at night.  Did also have recurring left knee pain , may need TKR soon.  Now seeing pain management, [facility].  Already trying to follow low salt diet.   Wt Readings from Last 3 Encounters:  10/22/23 166 lb (75.3 kg)  07/10/23 160 lb (72.6 kg)  03/10/23 161 lb 13.1 oz (73.4 kg)   BP Readings from Last 3 Encounters:  10/22/23 130/76  07/10/23 (!) 140/78  03/10/23 (!) 156/76         Past Medical History:  Diagnosis Date   Allergy    Chronic kidney disease    Degenerative joint disease    Diabetes mellitus    GERD (gastroesophageal reflux disease)    Headache    Hyperlipidemia    Hypertension    Stroke [facility])    Past Surgical History:  Procedure Laterality Date   ABDOMINAL HYSTERECTOMY  1976   ANTERIOR CERVICAL DECOMP/DISCECTOMY FUSION N/A 02/14/2023   Procedure: CERVICAL THREE-FIVE ANTERIOR CERVICAL DECOMPRESSION/DISCECTOMY FUSION;  Surgeon: Diedra Fowler, MD;  Location: [facility] OR;  Service: Orthopedics;  Laterality: N/A;   BACK SURGERY     KNEE ARTHROSCOPY     left x 2   LUMBAR LAMINECTOMY  1989    reports that she quit smoking about 33 years ago. Her smoking use included cigarettes. She started smoking about 48 years ago. She has a 7.5 pack-year smoking history. She has never used smokeless tobacco. She reports that she does not drink alcohol and does not use drugs. family  history includes Diabetes in her mother and sister; Heart disease in her brother and mother; Hypertension in her brother and mother; Kidney disease in her mother; Stroke in her brother; Thyroid  disease in her mother. Allergies  Allergen Reactions   Contrast Media [Iodinated Contrast Media] Anaphylaxis, Nausea And Vomiting, Swelling and Other (See Comments)    Patients throat closes with nausea and vomiting    Aspirin  Nausea And Vomiting and Other (See Comments)    Can take EC aspirin    Gadolinium Derivatives Nausea And Vomiting   Metformin  And Related Nausea And Vomiting and Other (See Comments)    Severe nausea and vomiting   Shellfish Allergy Nausea And Vomiting   Tramadol  Nausea And Vomiting and Other (See Comments)    Migraines   Penicillins Nausea And Vomiting   Statins Other (See Comments)    Myalgias Can take Pravastatin     Current Outpatient Medications on File Prior to Visit  Medication Sig Dispense Refill   Accu-Chek Softclix Lancets lancets USE   TO CHECK GLUCOSE TWICE DAILY 200 each 11   acetaminophen  (TYLENOL ) 500 MG tablet Take 2 tablets (1,000 mg total) by mouth every 8 (eight) hours. 30 tablet 0   amLODipine  (NORVASC ) 5 MG tablet Take 1 tablet (5 mg total) by mouth daily. 90 tablet 3  aspirin  EC 325 MG EC tablet Take 1 tablet (325 mg total) by mouth daily. 30 tablet 0   Blood Glucose Monitoring Suppl (ONE TOUCH ULTRA 2) w/Device KIT Apply 1 Device topically in the morning and at bedtime. E11.9 1 kit 0   cetirizine  (ZYRTEC ) 5 MG tablet Take 1 tablet (5 mg total) by mouth daily. (Patient taking differently: Take 5 mg by mouth daily as needed for allergies.) 15 tablet 0   cyclobenzaprine  (FLEXERIL ) 10 MG tablet Take 1 tablet (10 mg total) by mouth 2 (two) times daily as needed for muscle spasms. 10 tablet 0   doxycycline  (VIBRA -TABS) 100 MG tablet Take 1 tablet (100 mg total) by mouth 2 (two) times daily. 20 tablet 0   ezetimibe  (ZETIA ) 10 MG tablet Take 1 tablet (10 mg  total) by mouth daily. 90 tablet 3   famotidine  (PEPCID ) 20 MG tablet Take 1 tablet (20 mg total) by mouth 2 (two) times daily. 180 tablet 3   gabapentin  (NEURONTIN ) 300 MG capsule Take 3 capsules (900 mg total) by mouth 3 (three) times daily. 270 capsule 5   glucose blood (ONETOUCH ULTRA) test strip USE  STRIP TO CHECK GLUCOSE AS DIRECTED TWICE DAILY 200 each 0   HYDROcodone -acetaminophen  (NORCO) 10-325 MG tablet SMARTSIG:0.5-1 Tablet(s) By Mouth 1-3 Times Daily     insulin  glargine (LANTUS  SOLOSTAR) 100 UNIT/ML Solostar Pen Inject 45 Units into the skin daily. 15 mL 11   Insulin  Pen Needle 32G X 4 MM MISC Use to inject  lantus  once daily 100 each 4   levothyroxine (SYNTHROID) 25 MCG tablet Take 25 mcg by mouth daily.     losartan  (COZAAR ) 50 MG tablet Take 1 tablet (50 mg total) by mouth daily. 90 tablet 3   meloxicam  (MOBIC ) 15 MG tablet Take 15 mg by mouth daily.     MM STOOL SOFTENER LAXATIVE 100 MG capsule Take 100 mg by mouth daily.     pantoprazole  (PROTONIX ) 40 MG tablet Take 1 tablet (40 mg total) by mouth daily. 90 tablet 3   phenol (CHLORASEPTIC) 1.4 % LIQD Use as directed 1 spray in the mouth or throat as needed for throat irritation / pain. 177 mL 0   pravastatin  (PRAVACHOL ) 80 MG tablet Take 1 tablet (80 mg total) by mouth daily. 90 tablet 3   sodium chloride  (OCEAN) 0.65 % SOLN nasal spray Place 1 spray into both nostrils as needed for congestion.     triamcinolone  (NASACORT ) 55 MCG/ACT AERO nasal inhaler Place 2 sprays into the nose daily. (Patient taking differently: Place 2 sprays into the nose daily as needed (allergies).) 3 each 3   VENTOLIN  HFA 108 (90 Base) MCG/ACT inhaler Inhale 2 puffs into the lungs every 6 (six) hours as needed for wheezing or shortness of breath. 18 g 2   zolpidem  (AMBIEN ) 10 MG tablet Take 1 tablet (10 mg total) by mouth at bedtime as needed. 90 tablet 1   No current facility-administered medications on file prior to visit.        ROS:  All others  reviewed and negative.  Objective        PE:  BP 130/76 (BP Location: Right Arm, Patient Position: Sitting, Cuff Size: Normal)   Pulse 78   Temp 98 F (36.7 C) (Oral)   Ht 5\' 2"  (1.575 m)   Wt 166 lb (75.3 kg)   SpO2 98%   BMI 30.36 kg/m  Constitutional: Pt appears in NAD               HENT: Head: NCAT.                Right Ear: External ear normal.                 Left Ear: External ear normal. Bilat tm's with mild erythema.  Max sinus areas mild tender.  Pharynx with mild erythema, no exudate               Eyes: . Pupils are equal, round, and reactive to light. Conjunctivae and EOM are normal               Nose: without d/c or deformity               Neck: Neck supple. Gross normal ROM               Cardiovascular: Normal rate and regular rhythm.                 Pulmonary/Chest: Effort normal and breath sounds without rales or wheezing.                Abd:  Soft, NT, ND, + BS, no organomegaly               Neurological: Pt is alert. At baseline orientation, motor grossly intact               Skin: Skin is warm. No rashes, no other new lesions, LE edema - trace bilateral left > right to knees               Psychiatric: Pt behavior is normal without agitation   Micro: none  Cardiac tracings I have personally interpreted today:  none  Pertinent Radiological findings (summarize): none   Lab Results  Component Value Date   WBC 6.7 03/10/2023   HGB 12.3 03/10/2023   HCT 37.2 03/10/2023   PLT 311 03/10/2023   GLUCOSE 197 (H) 03/10/2023   CHOL 144 10/17/2022   TRIG 41.0 10/17/2022   HDL 51.40 10/17/2022   LDLCALC 84 10/17/2022   ALT 15 10/17/2022   AST 17 10/17/2022   NA 133 (L) 03/10/2023   K 3.8 03/10/2023   CL 98 03/10/2023   CREATININE 0.76 03/10/2023   BUN 13 03/10/2023   CO2 24 03/10/2023   TSH 1.91 10/17/2022   INR 1.0 10/06/2020   HGBA1C 6.3 (H) 02/04/2023   MICROALBUR 1.1 10/17/2022   Assessment/Plan:  Charnika Herbst is a 73 y.o. Black or  African American [2] female with  has a past medical history of Allergy, Chronic kidney disease, Degenerative joint disease, Diabetes mellitus, GERD (gastroesophageal reflux disease), Headache, Hyperlipidemia, Hypertension, and Stroke (HCC).  Upper respiratory infection Mild to mod, for antibx course zpack, cough med prn,,  to f/u any worsening symptoms or concerns  Peripheral edema Mild likley venous insufficiency, for low salt diet, leg elevation, compression stockings, hct 12.5 every day,  to f/u any worsening symptoms or concerns  Essential hypertension BP Readings from Last 3 Encounters:  10/22/23 130/76  07/10/23 (!) 140/78  03/10/23 (!) 156/76   Stable, pt to continue medical treatment norvasc  5 mg every day, losartan  50 mg qd   Vitamin D  deficiency Last vitamin D  Lab Results  Component Value Date   VD25OH 50.60 10/17/2022   Stable, cont oral replacement   B12 deficiency Lab Results  Component Value Date  VITAMINB12 >1500 (H) 10/17/2022   Stable, cont oral replacement - b12 1000 mcg qd   Type 2 diabetes mellitus with hyperglycemia, with long-term current use of insulin  (HCC) Lab Results  Component Value Date   HGBA1C 6.3 (H) 02/04/2023   Stable, pt to continue current medical treatment lantus  45 u qd   Followup: Return if symptoms worsen or fail to improve.  Rosalia Colonel, MD 10/22/2023 8:41 PM [facility]

## 2023-10-23 ENCOUNTER — Encounter: Admitting: Internal Medicine

## 2023-10-31 ENCOUNTER — Other Ambulatory Visit: Payer: Self-pay | Admitting: Internal Medicine

## 2023-10-31 ENCOUNTER — Other Ambulatory Visit: Payer: Self-pay

## 2023-11-06 ENCOUNTER — Inpatient Hospital Stay (INDEPENDENT_AMBULATORY_CARE_PROVIDER_SITE_OTHER)

## 2023-11-06 DIAGNOSIS — I639 Cerebral infarction, unspecified: Secondary | ICD-10-CM

## 2023-11-06 LAB — CUP PACEART REMOTE DEVICE CHECK
Date Time Interrogation Session: 20250611231236
Implantable Pulse Generator Implant Date: 20220802

## 2023-11-07 ENCOUNTER — Other Ambulatory Visit: Payer: Self-pay | Admitting: Internal Medicine

## 2023-11-10 ENCOUNTER — Inpatient Hospital Stay: Payer: Medicare Other

## 2023-11-13 ENCOUNTER — Other Ambulatory Visit: Payer: Self-pay | Admitting: Internal Medicine

## 2023-11-17 ENCOUNTER — Ambulatory Visit: Payer: Self-pay | Admitting: Cardiovascular Disease

## 2023-11-20 NOTE — Progress Notes (Signed)
 [facility] Summary Report / Loop Recorder

## 2023-12-03 LAB — HM DIABETES EYE EXAM

## 2023-12-04 ENCOUNTER — Encounter: Payer: Self-pay | Admitting: Internal Medicine

## 2023-12-05 ENCOUNTER — Telehealth: Payer: Self-pay

## 2023-12-05 DIAGNOSIS — Z1211 Encounter for screening for malignant neoplasm of colon: Secondary | ICD-10-CM

## 2023-12-05 NOTE — Addendum Note (Signed)
 Addended by: NORLEEN LYNWOOD ORN on: 12/05/2023 04:47 PM   Modules accepted: Orders

## 2023-12-05 NOTE — Telephone Encounter (Signed)
 Copied from CRM [id number]. Topic: General - Other >> Dec 05, 2023  1:27 PM Frederich PARAS wrote: Reason for CRM: pt needs a colonoscopy apt set  asap

## 2023-12-05 NOTE — Telephone Encounter (Signed)
Ok this referral is done 

## 2023-12-08 ENCOUNTER — Inpatient Hospital Stay (INDEPENDENT_AMBULATORY_CARE_PROVIDER_SITE_OTHER)

## 2023-12-08 ENCOUNTER — Ambulatory Visit: Payer: Self-pay | Admitting: Cardiovascular Disease

## 2023-12-08 DIAGNOSIS — I639 Cerebral infarction, unspecified: Secondary | ICD-10-CM

## 2023-12-08 LAB — CUP PACEART REMOTE DEVICE CHECK
Date Time Interrogation Session: 20250713233309
Implantable Pulse Generator Implant Date: 20220802

## 2023-12-09 ENCOUNTER — Other Ambulatory Visit: Payer: Self-pay | Admitting: Internal Medicine

## 2023-12-09 NOTE — Telephone Encounter (Signed)
 Called and informed patient of colonoscopy referral being placed as well as providing them with a number to [facility]

## 2023-12-15 ENCOUNTER — Inpatient Hospital Stay: Payer: Medicare Other

## 2023-12-24 ENCOUNTER — Encounter: Payer: Self-pay | Admitting: Gastroenterology

## 2023-12-31 NOTE — Progress Notes (Signed)
 [facility] Summary Report / Loop Recorder

## 2024-01-02 ENCOUNTER — Other Ambulatory Visit: Payer: Self-pay | Admitting: Internal Medicine

## 2024-01-02 NOTE — Telephone Encounter (Signed)
 Copied from CRM [id number]. Topic: Clinical - Medication Question >> Jan 02, 2024 12:27 PM Tiffany [name] wrote: Reason for CRM: Patient wants her Amlodipine  to be sent to [facility], NOT [facility]. She wanted to let Dr. Norleen nurse know. Patient does not need a call back.

## 2024-01-02 NOTE — Telephone Encounter (Signed)
 Called and left detailed voice message to see if she wanted the [facility]ip[facility]  [facility] on [facility]. Previously filled at [facility] for a years supply back in February

## 2024-01-08 ENCOUNTER — Inpatient Hospital Stay (INDEPENDENT_AMBULATORY_CARE_PROVIDER_SITE_OTHER)

## 2024-01-08 ENCOUNTER — Ambulatory Visit (INDEPENDENT_AMBULATORY_CARE_PROVIDER_SITE_OTHER): Payer: Medicare Other | Admitting: Internal Medicine

## 2024-01-08 ENCOUNTER — Ambulatory Visit: Payer: Self-pay | Admitting: Internal Medicine

## 2024-01-08 ENCOUNTER — Encounter: Payer: Self-pay | Admitting: Internal Medicine

## 2024-01-08 ENCOUNTER — Other Ambulatory Visit: Payer: Self-pay | Admitting: Internal Medicine

## 2024-01-08 VITALS — BP 132/88 | HR 84 | Temp 98.2°F | Ht 60.0 in | Wt 169.0 lb

## 2024-01-08 DIAGNOSIS — M791 Myalgia, unspecified site: Secondary | ICD-10-CM | POA: Diagnosis not present

## 2024-01-08 DIAGNOSIS — G4709 Other insomnia: Secondary | ICD-10-CM | POA: Diagnosis not present

## 2024-01-08 DIAGNOSIS — E1165 Type 2 diabetes mellitus with hyperglycemia: Secondary | ICD-10-CM | POA: Diagnosis not present

## 2024-01-08 DIAGNOSIS — E559 Vitamin D deficiency, unspecified: Secondary | ICD-10-CM

## 2024-01-08 DIAGNOSIS — I639 Cerebral infarction, unspecified: Secondary | ICD-10-CM

## 2024-01-08 DIAGNOSIS — E538 Deficiency of other specified B group vitamins: Secondary | ICD-10-CM | POA: Diagnosis not present

## 2024-01-08 DIAGNOSIS — J069 Acute upper respiratory infection, unspecified: Secondary | ICD-10-CM | POA: Insufficient documentation

## 2024-01-08 DIAGNOSIS — I10 Essential (primary) hypertension: Secondary | ICD-10-CM

## 2024-01-08 DIAGNOSIS — E782 Mixed hyperlipidemia: Secondary | ICD-10-CM | POA: Diagnosis not present

## 2024-01-08 DIAGNOSIS — Z794 Long term (current) use of insulin: Secondary | ICD-10-CM | POA: Diagnosis not present

## 2024-01-08 LAB — CBC WITH DIFFERENTIAL/PLATELET
Basophils Absolute: 0 K/uL (ref 0.0–0.1)
Basophils Relative: 0.5 % (ref 0.0–3.0)
Eosinophils Absolute: 0.1 K/uL (ref 0.0–0.7)
Eosinophils Relative: 2.7 % (ref 0.0–5.0)
HCT: 36.9 % (ref 36.0–46.0)
Hemoglobin: 12 g/dL (ref 12.0–15.0)
Lymphocytes Relative: 26.7 % (ref 12.0–46.0)
Lymphs Abs: 0.9 K/uL (ref 0.7–4.0)
MCHC: 32.7 g/dL (ref 30.0–36.0)
MCV: 87.9 fl (ref 78.0–100.0)
Monocytes Absolute: 0.3 K/uL (ref 0.1–1.0)
Monocytes Relative: 8.4 % (ref 3.0–12.0)
Neutro Abs: 2.2 K/uL (ref 1.4–7.7)
Neutrophils Relative %: 61.7 % (ref 43.0–77.0)
Platelets: 231 K/uL (ref 150.0–400.0)
RBC: 4.19 Mil/uL (ref 3.87–5.11)
RDW: 14.2 % (ref 11.5–15.5)
WBC: 3.5 K/uL — ABNORMAL LOW (ref 4.0–10.5)

## 2024-01-08 LAB — URINALYSIS, ROUTINE W REFLEX MICROSCOPIC
Bilirubin Urine: NEGATIVE
Hgb urine dipstick: NEGATIVE
Ketones, ur: NEGATIVE
Nitrite: NEGATIVE
RBC / HPF: NONE SEEN (ref 0–?)
Specific Gravity, Urine: 1.01 (ref 1.000–1.030)
Total Protein, Urine: NEGATIVE
Urine Glucose: NEGATIVE
Urobilinogen, UA: 0.2 (ref 0.0–1.0)
pH: 6 (ref 5.0–8.0)

## 2024-01-08 LAB — LIPID PANEL
Cholesterol: 140 mg/dL (ref 0–200)
HDL: 50.6 mg/dL (ref >39.00)
LDL Cholesterol: 81 mg/dL (ref 0–99)
NonHDL: 89.55
Total CHOL/HDL Ratio: 3
Triglycerides: 44 mg/dL (ref 0.0–149.0)
VLDL: 8.8 mg/dL (ref 0.0–40.0)

## 2024-01-08 LAB — HEPATIC FUNCTION PANEL
ALT: 33 U/L (ref 0–35)
AST: 31 U/L (ref 0–37)
Albumin: 4.1 g/dL (ref 3.5–5.2)
Alkaline Phosphatase: 67 U/L (ref 39–117)
Bilirubin, Direct: 0.1 mg/dL (ref 0.0–0.3)
Total Bilirubin: 0.3 mg/dL (ref 0.2–1.2)
Total Protein: 8.1 g/dL (ref 6.0–8.3)

## 2024-01-08 LAB — BASIC METABOLIC PANEL WITH GFR
BUN: 19 mg/dL (ref 6–23)
CO2: 28 meq/L (ref 19–32)
Calcium: 8.9 mg/dL (ref 8.4–10.5)
Chloride: 106 meq/L (ref 96–112)
Creatinine, Ser: 0.76 mg/dL (ref 0.40–1.20)
GFR: 78.24 mL/min (ref >60.00)
Glucose, Bld: 107 mg/dL — ABNORMAL HIGH (ref 70–99)
Potassium: 4.2 meq/L (ref 3.5–5.1)
Sodium: 141 meq/L (ref 135–145)

## 2024-01-08 LAB — CUP PACEART REMOTE DEVICE CHECK
Date Time Interrogation Session: 20250813231545
Implantable Pulse Generator Implant Date: 20220802

## 2024-01-08 LAB — VITAMIN D 25 HYDROXY (VIT D DEFICIENCY, FRACTURES): VITD: 44.52 ng/mL (ref 30.00–100.00)

## 2024-01-08 LAB — HEMOGLOBIN A1C: Hgb A1c MFr Bld: 7.4 % — ABNORMAL HIGH (ref 4.6–6.5)

## 2024-01-08 LAB — VITAMIN B12: Vitamin B-12: 257 pg/mL (ref 211–911)

## 2024-01-08 LAB — TSH: TSH: 1.1 u[IU]/mL (ref 0.35–5.50)

## 2024-01-08 MED ORDER — TIZANIDINE HCL 2 MG PO CAPS: 2.0000 mg | ORAL_CAPSULE | Freq: Three times a day (TID) | ORAL | 5 refills | Status: DC

## 2024-01-08 MED ORDER — ZOLPIDEM TARTRATE 10 MG PO TABS: 10.0000 mg | ORAL_TABLET | Freq: Every evening | ORAL | 1 refills | Status: AC | PRN

## 2024-01-08 MED ORDER — LANTUS SOLOSTAR 100 UNIT/ML ~~LOC~~ SOPN: 60.0000 [IU] | PEN_INJECTOR | Freq: Every day | SUBCUTANEOUS | 11 refills | Status: AC

## 2024-01-08 MED ORDER — AZITHROMYCIN 250 MG PO TABS: ORAL_TABLET | ORAL | 1 refills | Status: AC

## 2024-01-08 NOTE — Assessment & Plan Note (Signed)
 Lab Results  Component Value Date   VITAMINB12 257 01/08/2024   Low, to start oral replacement - b12 1000 mcg qd

## 2024-01-08 NOTE — Progress Notes (Signed)
 Patient ID: Tiffany [name], female   DOB: [birth year], 73 y.o.   MRN: [id number]        Chief Complaint: follow up URI, myalgias, insomnia, low vit d, dm, hld, htn, low b12       HPI:  Tiffany [name] is a 73 y.o. female  Here with 2-3 days acute onset fever, facial pain, pressure, headache, general weakness and malaise, and greenish d/c, with mild ST and cough, but pt denies chest pain, wheezing, increased sob or doe, orthopnea, PND, increased LE swelling, palpitations, dizziness or syncope.   Pt denies polydipsia, polyuria, or new focal neuro s/s.    Pt denies fever, wt loss, night sweats, loss of appetite, or other constitutional symptoms  Due for scheduled colonoscopy sept 17.   Wt Readings from Last 3 Encounters:  01/08/24 169 lb (76.7 kg)  10/22/23 166 lb (75.3 kg)  07/10/23 160 lb (72.6 kg)   BP Readings from Last 3 Encounters:  01/08/24 132/88  10/22/23 130/76  07/10/23 (!) 140/78         Past Medical History:  Diagnosis Date   Allergy    Chronic kidney disease    Degenerative joint disease    Diabetes mellitus    GERD (gastroesophageal reflux disease)    Headache    Hyperlipidemia    Hypertension    Stroke [facility])    Past Surgical History:  Procedure Laterality Date   ABDOMINAL HYSTERECTOMY  1976   ANTERIOR CERVICAL DECOMP/DISCECTOMY FUSION N/A 02/14/2023   Procedure: CERVICAL THREE-FIVE ANTERIOR CERVICAL DECOMPRESSION/DISCECTOMY FUSION;  Surgeon: Georgina Ozell LABOR, MD;  Location: [facility] OR;  Service: Orthopedics;  Laterality: N/A;   BACK SURGERY     KNEE ARTHROSCOPY     left x 2   LUMBAR LAMINECTOMY  1989    reports that she quit smoking about 33 years ago. Her smoking use included cigarettes. She started smoking about 48 years ago. She has a 7.5 pack-year smoking history. She has never used smokeless tobacco. She reports that she does not drink alcohol and does not use drugs. family history includes Diabetes in her mother and sister; Heart disease in her brother and  mother; Hypertension in her brother and mother; Kidney disease in her mother; Stroke in her brother; Thyroid  disease in her mother. Allergies  Allergen Reactions   Contrast Media [Iodinated Contrast Media] Anaphylaxis, Nausea And Vomiting, Swelling and Other (See Comments)    Patients throat closes with nausea and vomiting    Aspirin  Nausea And Vomiting and Other (See Comments)    Can take EC aspirin    Gadolinium Derivatives Nausea And Vomiting   Metformin  And Related Nausea And Vomiting and Other (See Comments)    Severe nausea and vomiting   Shellfish Allergy Nausea And Vomiting   Tramadol  Nausea And Vomiting and Other (See Comments)    Migraines   Penicillins Nausea And Vomiting   Statins Other (See Comments)    Myalgias Can take Pravastatin     Current Outpatient Medications on File Prior to Visit  Medication Sig Dispense Refill   Accu-Chek Softclix Lancets lancets USE   TO CHECK GLUCOSE TWICE DAILY 200 each 11   acetaminophen  (TYLENOL ) 500 MG tablet Take 2 tablets (1,000 mg total) by mouth every 8 (eight) hours. 30 tablet 0   amLODipine  (NORVASC ) 5 MG tablet TAKE 1 TABLET BY MOUTH EVERY DAY 90 tablet 3   aspirin  EC 325 MG EC tablet Take 1 tablet (325 mg total) by mouth daily. 30 tablet 0  Blood Glucose Monitoring Suppl (ONE TOUCH ULTRA 2) w/Device KIT Apply 1 Device topically in the morning and at bedtime. E11.9 1 kit 0   cetirizine  (ZYRTEC ) 5 MG tablet Take 1 tablet (5 mg total) by mouth daily. (Patient taking differently: Take 5 mg by mouth daily as needed for allergies.) 15 tablet 0   doxycycline  (VIBRA -TABS) 100 MG tablet Take 1 tablet (100 mg total) by mouth 2 (two) times daily. 20 tablet 0   EPINEPHrine  0.3 mg/0.3 mL IJ SOAJ injection Inject 0.3 mg into the muscle as needed for anaphylaxis. 2 each 0   ezetimibe  (ZETIA ) 10 MG tablet TAKE 1 TABLET BY MOUTH EVERY DAY 90 tablet 3   famotidine  (PEPCID ) 20 MG tablet Take 1 tablet (20 mg total) by mouth 2 (two) times daily. 180  tablet 3   gabapentin  (NEURONTIN ) 300 MG capsule Take 3 capsules (900 mg total) by mouth 3 (three) times daily. 270 capsule 5   glucose blood (ONETOUCH ULTRA) test strip CHECK BLOOD SUGAR TWICE DAILY AS DIRECTED 200 each 0   hydrochlorothiazide  (MICROZIDE ) 12.5 MG capsule Take 1 capsule (12.5 mg total) by mouth daily. 90 capsule 3   HYDROcodone -acetaminophen  (NORCO) 10-325 MG tablet SMARTSIG:0.5-1 Tablet(s) By Mouth 1-3 Times Daily     Insulin  Pen Needle 32G X 4 MM MISC Use to inject  lantus  once daily 100 each 4   levothyroxine (SYNTHROID) 25 MCG tablet Take 25 mcg by mouth daily.     losartan  (COZAAR ) 50 MG tablet Take 1 tablet (50 mg total) by mouth daily. 90 tablet 3   meloxicam  (MOBIC ) 15 MG tablet Take 15 mg by mouth daily.     MM STOOL SOFTENER LAXATIVE 100 MG capsule Take 100 mg by mouth daily.     pantoprazole  (PROTONIX ) 40 MG tablet TAKE 1 TABLET BY MOUTH EVERY DAY 90 tablet 3   phenol (CHLORASEPTIC) 1.4 % LIQD Use as directed 1 spray in the mouth or throat as needed for throat irritation / pain. 177 mL 0   pravastatin  (PRAVACHOL ) 80 MG tablet TAKE 1 TABLET BY MOUTH EVERY DAY 90 tablet 3   sodium chloride  (OCEAN) 0.65 % SOLN nasal spray Place 1 spray into both nostrils as needed for congestion.     triamcinolone  (NASACORT ) 55 MCG/ACT AERO nasal inhaler Place 2 sprays into the nose daily. (Patient taking differently: Place 2 sprays into the nose daily as needed (allergies).) 3 each 3   VENTOLIN  HFA 108 (90 Base) MCG/ACT inhaler Inhale 2 puffs into the lungs every 6 (six) hours as needed for wheezing or shortness of breath. 18 g 2   No current facility-administered medications on file prior to visit.        ROS:  All others reviewed and negative.  Objective        PE:  BP 132/88 (BP Location: Left Arm, Patient Position: Sitting, Cuff Size: Normal)   Pulse 84   Temp 98.2 F (36.8 C) (Oral)   Ht 5' (1.524 m)   Wt 169 lb (76.7 kg)   SpO2 97%   BMI 33.01 kg/m                  Constitutional: Pt appears mild ill               HENT: Head: NCAT.                Right Ear: External ear normal.  Left Ear: External ear normal. Bilat tm's with mild erythema.  Max sinus areas mild tender.  Pharynx with mild erythema, no exudate                Eyes: . Pupils are equal, round, and reactive to light. Conjunctivae and EOM are normal               Nose: without d/c or deformity               Neck: Neck supple. Gross normal ROM               Cardiovascular: Normal rate and regular rhythm.                 Pulmonary/Chest: Effort normal and breath sounds without rales or wheezing.                Abd:  Soft, NT, ND, + BS, no organomegaly               Neurological: Pt is alert. At baseline orientation, motor grossly intact               Skin: Skin is warm. No rashes, no other new lesions, LE edema - none               Psychiatric: Pt behavior is normal without agitation   Micro: none  Cardiac tracings I have personally interpreted today:  none  Pertinent Radiological findings (summarize): none   Lab Results  Component Value Date   WBC 3.5 (L) 01/08/2024   HGB 12.0 01/08/2024   HCT 36.9 01/08/2024   PLT 231.0 01/08/2024   GLUCOSE 107 (H) 01/08/2024   CHOL 140 01/08/2024   TRIG 44.0 01/08/2024   HDL 50.60 01/08/2024   LDLCALC 81 01/08/2024   ALT 33 01/08/2024   AST 31 01/08/2024   NA 141 01/08/2024   K 4.2 01/08/2024   CL 106 01/08/2024   CREATININE 0.76 01/08/2024   BUN 19 01/08/2024   CO2 28 01/08/2024   TSH 1.10 01/08/2024   INR 1.0 10/06/2020   HGBA1C 7.4 (H) 01/08/2024   Assessment/Plan:  Tiffany [name] is a 73 y.o. Black or African American [2] female with  has a past medical history of Allergy, Chronic kidney disease, Degenerative joint disease, Diabetes mellitus, GERD (gastroesophageal reflux disease), Headache, Hyperlipidemia, Hypertension, and Stroke (HCC).  Vitamin D  deficiency Last vitamin D  Lab Results  Component Value Date    VD25OH 44.52 01/08/2024   Stable, cont oral replacement   Type 2 diabetes mellitus with hyperglycemia, with long-term current use of insulin  (HCC) Lab Results  Component Value Date   HGBA1C 7.4 (H) 01/08/2024   Uncontrolled mildly, pt to increase lantus  60 u qhs   Hyperlipidemia Lab Results  Component Value Date   LDLCALC 81 01/08/2024   Stable, pt to continue current statin pravachol  80 mg every day and zetia  10 mg every day, and lower chol diet, declines other change   Essential hypertension BP Readings from Last 3 Encounters:  01/08/24 132/88  10/22/23 130/76  07/10/23 (!) 140/78   Stable, pt to continue medical treatment norvasc  5 every day, hct 12.5 every day, losartan  50 mg qd   B12 deficiency Lab Results  Component Value Date   VITAMINB12 257 01/08/2024   Low, to start oral replacement - b12 1000 mcg qd   Acute upper respiratory infection Mild to mod, for zpack x 1,,  to f/u any worsening symptoms or  concerns  Myalgia For labs as ordered today including cbc, or tizanidine  2 mg prn as this seemed more effective than flexeril   Depression and insomnia Also for restart ambien  5 mg at bedtime prn  Followup: Return in about 6 months (around 07/10/2024).  Lynwood Rush, MD 01/08/2024 8:26 PM [facility] - [facility]

## 2024-01-08 NOTE — Assessment & Plan Note (Signed)
 Mild to mod, for zpack x 1,,  to f/u any worsening symptoms or concerns

## 2024-01-08 NOTE — Patient Instructions (Signed)
 Please take all new medication as prescribed - the antibiotic  Ok to change the flexeril  back to the Tizanidine   Please continue all other medications as before, including the ambien   Please have the pharmacy call with any other refills you may need.  Please continue your efforts at being more active, low cholesterol diet, and weight control.  Please keep your appointments with your specialists as you may have planned  Please go to the LAB at the blood drawing area for the tests to be done  You will be contacted by phone if any changes need to be made immediately.  Otherwise, you will receive a letter about your results with an explanation, but please check with MyChart first.  Please make an Appointment to return in 6 months, or sooner if needed

## 2024-01-08 NOTE — Assessment & Plan Note (Signed)
 Lab Results  Component Value Date   LDLCALC 81 01/08/2024   Stable, pt to continue current statin pravachol  80 mg every day and zetia  10 mg every day, and lower chol diet, declines other change

## 2024-01-08 NOTE — Assessment & Plan Note (Signed)
 Lab Results  Component Value Date   HGBA1C 7.4 (H) 01/08/2024   Uncontrolled mildly, pt to increase lantus  60 u qhs

## 2024-01-08 NOTE — Assessment & Plan Note (Signed)
 BP Readings from Last 3 Encounters:  01/08/24 132/88  10/22/23 130/76  07/10/23 (!) 140/78   Stable, pt to continue medical treatment norvasc  5 every day, hct 12.5 every day, losartan  50 mg qd

## 2024-01-08 NOTE — Assessment & Plan Note (Signed)
 Also for restart ambien  5 mg at bedtime prn

## 2024-01-08 NOTE — Assessment & Plan Note (Signed)
 For labs as ordered today including cbc, or tizanidine  2 mg prn as this seemed more effective than flexeril 

## 2024-01-08 NOTE — Assessment & Plan Note (Signed)
 Last vitamin D  Lab Results  Component Value Date   VD25OH 44.52 01/08/2024   Stable, cont oral replacement

## 2024-01-09 ENCOUNTER — Ambulatory Visit: Payer: Self-pay | Admitting: Cardiovascular Disease

## 2024-01-09 MED ORDER — TIZANIDINE HCL 2 MG PO TABS: 2.0000 mg | ORAL_TABLET | Freq: Four times a day (QID) | ORAL | 2 refills | Status: AC | PRN

## 2024-01-09 NOTE — Addendum Note (Signed)
 Addended by: NORLEEN LYNWOOD ORN on: 01/09/2024 09:38 AM   Modules accepted: Orders

## 2024-01-19 ENCOUNTER — Inpatient Hospital Stay: Payer: Medicare Other

## 2024-01-27 ENCOUNTER — Other Ambulatory Visit: Payer: Self-pay | Admitting: Internal Medicine

## 2024-01-28 ENCOUNTER — Ambulatory Visit (AMBULATORY_SURGERY_CENTER)

## 2024-01-28 VITALS — Ht 62.0 in | Wt 164.0 lb

## 2024-01-28 DIAGNOSIS — Z1211 Encounter for screening for malignant neoplasm of colon: Secondary | ICD-10-CM

## 2024-01-28 MED ORDER — NA SULFATE-K SULFATE-MG SULF 17.5-3.13-1.6 GM/177ML PO SOLN: 1.0000 | Freq: Once | ORAL | 0 refills | Status: AC

## 2024-01-28 NOTE — Progress Notes (Signed)
 No issues known to pt with past sedation with any surgeries or procedures Patient denies ever being told they had issues or difficulty with intubation  No FH of Malignant Hyperthermia Pt is not on diet pills nor GLP-1 medications Pt is not on home 02  Pt is not on blood thinners  Pt denies issues with chronic constipation  No A fib or A flutter Have any cardiac testing pending--no Has loop recorder which is checked once/month Pt instructed to use [employer] or GoodRx for a price reduction on prep  Ambulates with a cane

## 2024-01-29 ENCOUNTER — Encounter: Payer: Self-pay | Admitting: Gastroenterology

## 2024-02-06 ENCOUNTER — Telehealth: Payer: Self-pay | Admitting: Gastroenterology

## 2024-02-06 NOTE — Telephone Encounter (Signed)
 Received a call from patient regarding colonoscopy 9/17, Is requseting nurse fu call to discuss whether some medications are ok to take. Please review and advise  Thank you

## 2024-02-09 ENCOUNTER — Telehealth: Payer: Self-pay | Admitting: Gastroenterology

## 2024-02-09 ENCOUNTER — Inpatient Hospital Stay (INDEPENDENT_AMBULATORY_CARE_PROVIDER_SITE_OTHER)

## 2024-02-09 DIAGNOSIS — I639 Cerebral infarction, unspecified: Secondary | ICD-10-CM | POA: Diagnosis not present

## 2024-02-09 NOTE — Telephone Encounter (Signed)
 Went over all acceptable liquids the day before procedure.  RN reviewed all medications including Lantus  directions.  Patient verbally understood all directions.

## 2024-02-09 NOTE — Telephone Encounter (Signed)
 Inbound call from patient stating she would like to speak to nurse in regards to medication she is taking and her having an upcoming procedure on 02/11/24 Requesting a call back  Please advise  Thank you

## 2024-02-10 ENCOUNTER — Telehealth: Payer: Self-pay

## 2024-02-10 LAB — CUP PACEART REMOTE DEVICE CHECK
Date Time Interrogation Session: 20250914232154
Implantable Pulse Generator Implant Date: 20220802

## 2024-02-10 NOTE — Telephone Encounter (Signed)
 Call to pt to move up her appt, pt has questions re: her prep, reviewed prep with pt, pt states she has to call her daughter to check if she can pick her up any earlier, will call me back.

## 2024-02-11 ENCOUNTER — Encounter: Admitting: Gastroenterology

## 2024-02-11 ENCOUNTER — Telehealth: Payer: Self-pay

## 2024-02-11 ENCOUNTER — Ambulatory Visit: Admitting: Gastroenterology

## 2024-02-11 ENCOUNTER — Encounter: Payer: Self-pay | Admitting: Gastroenterology

## 2024-02-11 VITALS — BP 138/74 | HR 79 | Temp 97.9°F | Resp 16 | Ht 62.0 in | Wt 164.0 lb

## 2024-02-11 DIAGNOSIS — K5730 Diverticulosis of large intestine without perforation or abscess without bleeding: Secondary | ICD-10-CM

## 2024-02-11 DIAGNOSIS — K644 Residual hemorrhoidal skin tags: Secondary | ICD-10-CM

## 2024-02-11 DIAGNOSIS — Q439 Congenital malformation of intestine, unspecified: Secondary | ICD-10-CM | POA: Diagnosis not present

## 2024-02-11 DIAGNOSIS — K562 Volvulus: Secondary | ICD-10-CM

## 2024-02-11 DIAGNOSIS — K641 Second degree hemorrhoids: Secondary | ICD-10-CM | POA: Diagnosis not present

## 2024-02-11 DIAGNOSIS — K635 Polyp of colon: Secondary | ICD-10-CM | POA: Diagnosis not present

## 2024-02-11 DIAGNOSIS — Z1211 Encounter for screening for malignant neoplasm of colon: Secondary | ICD-10-CM | POA: Diagnosis present

## 2024-02-11 DIAGNOSIS — D123 Benign neoplasm of transverse colon: Secondary | ICD-10-CM

## 2024-02-11 MED ORDER — SODIUM CHLORIDE 0.9 % IV SOLN: 500.0000 mL | Freq: Once | INTRAVENOUS | Status: DC

## 2024-02-11 MED ORDER — HYOSCYAMINE SULFATE 0.125 MG SL SUBL: 0.1250 mg | SUBLINGUAL_TABLET | Freq: Three times a day (TID) | SUBLINGUAL | 0 refills | Status: DC | PRN

## 2024-02-11 MED ORDER — DEXTROSE 5 % IV SOLN: INTRAVENOUS | Status: DC

## 2024-02-11 NOTE — Progress Notes (Signed)
 Patient evaluated in the Recovery area. She is having abdominal cramping and leg cramping as well as abdominal pain/discomfort. Her colonoscopy was technically more difficult due to her tortuosity and restricted mobility as documented in chart/note. Will plan to give further Simethicone  and Levsin  at this time for what is likely gas-pains. Will attempt to give patient different positions to help with passage of gas. If these do not improve her pains, then she will need to go to the ED. She and daughter understand and agree with this plan. We will reassess.   Aloha Finner, MD [facility] Office # [phone]   [phone] Addendum Patient re-evaluated after going to the restroom and passing gas and liquid per rectum. Abdominal discomfort/cramping is improving though still mild. Discussed consideration of transfer to the ED vs going home with close monitoring. If symptoms of abdominal pain progress/worsen or she has newer issues of N/V or fevers or development of lightheadedness or dizziness, she needs to call us  and she will be directed to go to the ED no matter time/day. I'll have my team call on her later this PM and also tomorrow as per normal protocol. She and daughter agree with this plan of action and would like to go home. Bland diet. Gas-X or Phazyme is recommended to be picked up and used for next 3-days. Will send in Levsin  for next few days as well to use PRN. Expect she should be better by tomorrow.  Aloha Finner, MD [facility] Office # [phone]

## 2024-02-11 NOTE — Progress Notes (Signed)
 Pt's states no medical or surgical changes since previsit or office visit.  CBG 61. Started D5W IV per protocol. Rechecked CBG after 15 minutes. CBG was 112.

## 2024-02-11 NOTE — Progress Notes (Signed)
 GASTROENTEROLOGY PROCEDURE H&P NOTE   Primary Care Physician: Norleen Lynwood ORN, MD  HPI: Tiffany [name] is a 73 y.o. female who presents for Colonoscopy for screening.  Past Medical History:  Diagnosis Date   Allergy    Chronic kidney disease    Degenerative joint disease    Diabetes mellitus    GERD (gastroesophageal reflux disease)    Headache    Hyperlipidemia    Hypertension    Stroke [facility]) 2021   Past Surgical History:  Procedure Laterality Date   ABDOMINAL HYSTERECTOMY  1976   ANTERIOR CERVICAL DECOMP/DISCECTOMY FUSION N/A 02/14/2023   Procedure: CERVICAL THREE-FIVE ANTERIOR CERVICAL DECOMPRESSION/DISCECTOMY FUSION;  Surgeon: Georgina Ozell LABOR, MD;  Location: [facility] OR;  Service: Orthopedics;  Laterality: N/A;   BACK SURGERY     KNEE ARTHROSCOPY     left x 2   LUMBAR LAMINECTOMY  1989   Current Outpatient Medications  Medication Sig Dispense Refill   Accu-Chek Softclix Lancets lancets USE   TO CHECK GLUCOSE TWICE DAILY 200 each 11   amLODipine  (NORVASC ) 5 MG tablet TAKE 1 TABLET BY MOUTH EVERY DAY 90 tablet 3   Blood Glucose Monitoring Suppl (ONE TOUCH ULTRA 2) w/Device KIT Apply 1 Device topically in the morning and at bedtime. E11.9 1 kit 0   ezetimibe  (ZETIA ) 10 MG tablet TAKE 1 TABLET BY MOUTH EVERY DAY 90 tablet 3   gabapentin  (NEURONTIN ) 300 MG capsule Take 3 capsules (900 mg total) by mouth 3 (three) times daily. 270 capsule 5   glucose blood (ONETOUCH ULTRA) test strip CHECK BLOOD SUGAR TWICE DAILY AS DIRECTED 200 each 0   hydrochlorothiazide  (MICROZIDE ) 12.5 MG capsule Take 1 capsule (12.5 mg total) by mouth daily. 90 capsule 3   HYDROcodone -acetaminophen  (NORCO) 10-325 MG tablet SMARTSIG:0.5-1 Tablet(s) By Mouth 1-3 Times Daily     insulin  glargine (LANTUS  SOLOSTAR) 100 UNIT/ML Solostar Pen Inject 60 Units into the skin at bedtime. 15 mL 11   Insulin  Pen Needle 32G X 4 MM MISC Use to inject  lantus  once daily 100 each 4   levothyroxine (SYNTHROID) 25 MCG tablet  Take 25 mcg by mouth daily.     losartan  (COZAAR ) 50 MG tablet Take 1 tablet (50 mg total) by mouth daily. 90 tablet 3   pantoprazole  (PROTONIX ) 40 MG tablet TAKE 1 TABLET BY MOUTH EVERY DAY 90 tablet 3   pravastatin  (PRAVACHOL ) 80 MG tablet TAKE 1 TABLET BY MOUTH EVERY DAY 90 tablet 3   sodium chloride  (OCEAN) 0.65 % SOLN nasal spray Place 1 spray into both nostrils as needed for congestion.     tiZANidine  (ZANAFLEX ) 2 MG tablet Take 1 tablet (2 mg total) by mouth every 6 (six) hours as needed for muscle spasms. 60 tablet 2   zolpidem  (AMBIEN ) 10 MG tablet Take 1 tablet (10 mg total) by mouth at bedtime as needed. 90 tablet 1   acetaminophen  (TYLENOL ) 500 MG tablet Take 2 tablets (1,000 mg total) by mouth every 8 (eight) hours. (Patient taking differently: Take 1,000 mg by mouth every 8 (eight) hours as needed.) 30 tablet 0   aspirin  EC 325 MG EC tablet Take 1 tablet (325 mg total) by mouth daily. (Patient not taking: Reported on 02/11/2024) 30 tablet 0   cetirizine  (ZYRTEC ) 5 MG tablet Take 1 tablet (5 mg total) by mouth daily. 15 tablet 0   cholecalciferol  (VITAMIN D3) 25 MCG (1000 UNIT) tablet Take 2,000 Units by mouth daily.     EPINEPHrine  0.3 mg/0.3  mL IJ SOAJ injection Inject 0.3 mg into the muscle as needed for anaphylaxis. 2 each 0   famotidine  (PEPCID ) 20 MG tablet Take 1 tablet (20 mg total) by mouth 2 (two) times daily. 180 tablet 3   meloxicam  (MOBIC ) 15 MG tablet Take 15 mg by mouth daily. (Patient not taking: Reported on 01/28/2024)     MM STOOL SOFTENER LAXATIVE 100 MG capsule Take 100 mg by mouth daily. (Patient taking differently: Take 100 mg by mouth daily as needed.)     phenol (CHLORASEPTIC) 1.4 % LIQD Use as directed 1 spray in the mouth or throat as needed for throat irritation / pain. (Patient not taking: Reported on 01/28/2024) 177 mL 0   triamcinolone  (NASACORT ) 55 MCG/ACT AERO nasal inhaler Place 2 sprays into the nose daily. 3 each 3   VENTOLIN  HFA 108 (90 Base) MCG/ACT  inhaler Inhale 2 puffs into the lungs every 6 (six) hours as needed for wheezing or shortness of breath. 18 g 2   Current Facility-Administered Medications  Medication Dose Route Frequency Provider Last Rate Last Admin   0.9 %  sodium chloride  infusion  500 mL Intravenous Once Mansouraty, Winnie Barsky Jr., MD       dextrose  5 % solution   Intravenous Continuous Mansouraty, Aloha Raddle., MD        Current Outpatient Medications:    Accu-Chek Softclix Lancets lancets, USE   TO CHECK GLUCOSE TWICE DAILY, Disp: 200 each, Rfl: 11   amLODipine  (NORVASC ) 5 MG tablet, TAKE 1 TABLET BY MOUTH EVERY DAY, Disp: 90 tablet, Rfl: 3   Blood Glucose Monitoring Suppl (ONE TOUCH ULTRA 2) w/Device KIT, Apply 1 Device topically in the morning and at bedtime. E11.9, Disp: 1 kit, Rfl: 0   ezetimibe  (ZETIA ) 10 MG tablet, TAKE 1 TABLET BY MOUTH EVERY DAY, Disp: 90 tablet, Rfl: 3   gabapentin  (NEURONTIN ) 300 MG capsule, Take 3 capsules (900 mg total) by mouth 3 (three) times daily., Disp: 270 capsule, Rfl: 5   glucose blood (ONETOUCH ULTRA) test strip, CHECK BLOOD SUGAR TWICE DAILY AS DIRECTED, Disp: 200 each, Rfl: 0   hydrochlorothiazide  (MICROZIDE ) 12.5 MG capsule, Take 1 capsule (12.5 mg total) by mouth daily., Disp: 90 capsule, Rfl: 3   HYDROcodone -acetaminophen  (NORCO) 10-325 MG tablet, SMARTSIG:0.5-1 Tablet(s) By Mouth 1-3 Times Daily, Disp: , Rfl:    insulin  glargine (LANTUS  SOLOSTAR) 100 UNIT/ML Solostar Pen, Inject 60 Units into the skin at bedtime., Disp: 15 mL, Rfl: 11   Insulin  Pen Needle 32G X 4 MM MISC, Use to inject  lantus  once daily, Disp: 100 each, Rfl: 4   levothyroxine (SYNTHROID) 25 MCG tablet, Take 25 mcg by mouth daily., Disp: , Rfl:    losartan  (COZAAR ) 50 MG tablet, Take 1 tablet (50 mg total) by mouth daily., Disp: 90 tablet, Rfl: 3   pantoprazole  (PROTONIX ) 40 MG tablet, TAKE 1 TABLET BY MOUTH EVERY DAY, Disp: 90 tablet, Rfl: 3   pravastatin  (PRAVACHOL ) 80 MG tablet, TAKE 1 TABLET BY MOUTH EVERY  DAY, Disp: 90 tablet, Rfl: 3   sodium chloride  (OCEAN) 0.65 % SOLN nasal spray, Place 1 spray into both nostrils as needed for congestion., Disp: , Rfl:    tiZANidine  (ZANAFLEX ) 2 MG tablet, Take 1 tablet (2 mg total) by mouth every 6 (six) hours as needed for muscle spasms., Disp: 60 tablet, Rfl: 2   zolpidem  (AMBIEN ) 10 MG tablet, Take 1 tablet (10 mg total) by mouth at bedtime as needed., Disp: 90 tablet, Rfl: 1  acetaminophen  (TYLENOL ) 500 MG tablet, Take 2 tablets (1,000 mg total) by mouth every 8 (eight) hours. (Patient taking differently: Take 1,000 mg by mouth every 8 (eight) hours as needed.), Disp: 30 tablet, Rfl: 0   aspirin  EC 325 MG EC tablet, Take 1 tablet (325 mg total) by mouth daily. (Patient not taking: Reported on 02/11/2024), Disp: 30 tablet, Rfl: 0   cetirizine  (ZYRTEC ) 5 MG tablet, Take 1 tablet (5 mg total) by mouth daily., Disp: 15 tablet, Rfl: 0   cholecalciferol  (VITAMIN D3) 25 MCG (1000 UNIT) tablet, Take 2,000 Units by mouth daily., Disp: , Rfl:    EPINEPHrine  0.3 mg/0.3 mL IJ SOAJ injection, Inject 0.3 mg into the muscle as needed for anaphylaxis., Disp: 2 each, Rfl: 0   famotidine  (PEPCID ) 20 MG tablet, Take 1 tablet (20 mg total) by mouth 2 (two) times daily., Disp: 180 tablet, Rfl: 3   meloxicam  (MOBIC ) 15 MG tablet, Take 15 mg by mouth daily. (Patient not taking: Reported on 01/28/2024), Disp: , Rfl:    MM STOOL SOFTENER LAXATIVE 100 MG capsule, Take 100 mg by mouth daily. (Patient taking differently: Take 100 mg by mouth daily as needed.), Disp: , Rfl:    phenol (CHLORASEPTIC) 1.4 % LIQD, Use as directed 1 spray in the mouth or throat as needed for throat irritation / pain. (Patient not taking: Reported on 01/28/2024), Disp: 177 mL, Rfl: 0   triamcinolone  (NASACORT ) 55 MCG/ACT AERO nasal inhaler, Place 2 sprays into the nose daily., Disp: 3 each, Rfl: 3   VENTOLIN  HFA 108 (90 Base) MCG/ACT inhaler, Inhale 2 puffs into the lungs every 6 (six) hours as needed for wheezing or  shortness of breath., Disp: 18 g, Rfl: 2  Current Facility-Administered Medications:    0.9 %  sodium chloride  infusion, 500 mL, Intravenous, Once, Mansouraty, Aloha Raddle., MD   dextrose  5 % solution, , Intravenous, Continuous, Mansouraty, Aloha Raddle., MD Allergies  Allergen Reactions   Contrast Media [Iodinated Contrast Media] Anaphylaxis, Nausea And Vomiting, Swelling and Other (See Comments)    Patients throat closes with nausea and vomiting    Aspirin  Nausea And Vomiting and Other (See Comments)    Can take EC aspirin    Gadolinium Derivatives Nausea And Vomiting   Metformin  And Related Nausea And Vomiting and Other (See Comments)    Severe nausea and vomiting   Penicillins Nausea And Vomiting   Shellfish Allergy Nausea And Vomiting   Statins Other (See Comments)    Myalgias Can take Pravastatin     Tramadol  Nausea And Vomiting and Other (See Comments)    Migraines   Family History  Problem Relation Age of Onset   Hypertension Mother    Diabetes Mother    Thyroid  disease Mother    Heart disease Mother    Kidney disease Mother    Diabetes Sister    Diverticulitis Sister    Hypertension Brother    Stroke Brother    Heart disease Brother    Colon cancer Neg Hx    Colon polyps Neg Hx    Rectal cancer Neg Hx    Stomach cancer Neg Hx    Esophageal cancer Neg Hx    Social History   Socioeconomic History   Marital status: Single    Spouse name: Not on file   Number of children: 2   Years of education: 8   Highest education level: 8th grade  Occupational History   Occupation: Disability    Comment: retired day Occupational hygienist  Tobacco  Use   Smoking status: Former    Current packs/day: 0.00    Average packs/day: 0.5 packs/day for 15.0 years (7.5 ttl pk-yrs)    Types: Cigarettes    Start date: 08/02/1975    Quit date: 08/02/1990    Years since quitting: 33.5   Smokeless tobacco: Never  Vaping Use   Vaping status: Never Used  Substance and Sexual Activity   Alcohol use:  No    Alcohol/week: 0.0 standard drinks of alcohol   Drug use: No   Sexual activity: Never  Other Topics Concern   Not on file  Social History Narrative   Born in [place], [place].   Completed the 8th grade   Fun: Walk, crochet, puzzles   Diet - Variety of foods, does not eat breakfast   2 children - 44 and 45    6 grandchildren - 7 great grandchildren.    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   Right handed    Caffeine : 1/2 cup of coffee, rarely    Social Drivers of Corporate investment banker Strain: Low Risk  (08/09/2022)   Overall Financial Resource Strain (CARDIA)    Difficulty of Paying Living Expenses: Not hard at all  Food Insecurity: No Food Insecurity (02/15/2023)   Hunger Vital Sign    Worried About Running Out of Food in the Last Year: Never true    Ran Out of Food in the Last Year: Never true  Transportation Needs: No Transportation Needs (02/15/2023)   PRAPARE - Administrator, Civil Service (Medical): No    Lack of Transportation (Non-Medical): No  Physical Activity: Insufficiently Active (08/09/2022)   Exercise Vital Sign    Days of Exercise per Week: 3 days    Minutes of Exercise per Session: 30 min  Stress: No Stress Concern Present (08/09/2022)   [employer] of Occupational Health - Occupational Stress Questionnaire    Feeling of Stress : Not at all  Social Connections: Socially Isolated (08/09/2022)   Social Connection and Isolation Panel    Frequency of Communication with Friends and Family: Twice a week    Frequency of Social Gatherings with Friends and Family: Twice a week    Attends Religious Services: Never    Database administrator or Organizations: No    Attends Banker Meetings: Never    Marital Status: Divorced  Catering manager Violence: Not At Risk (02/15/2023)   Humiliation, Afraid, Rape, and Kick questionnaire    Fear of Current or Ex-Partner: No    Emotionally Abused: No    Physically Abused: No     Sexually Abused: No    Physical Exam: Today's Vitals   02/11/24 0825  BP: (!) 142/66  Pulse: 78  Temp: 97.9 F (36.6 C)  TempSrc: Temporal  SpO2: 100%  Weight: 164 lb (74.4 kg)  Height: 5' 2 (1.575 m)   Body mass index is 30 kg/m. GEN: NAD EYE: Sclerae anicteric ENT: MMM CV: Non-tachycardic GI: Soft, NT/ND NEURO:  Alert & Oriented x 3  Lab Results: No results for input(s): WBC, HGB, HCT, PLT in the last 72 hours. BMET No results for input(s): NA, K, CL, CO2, GLUCOSE, BUN, CREATININE, CALCIUM  in the last 72 hours. LFT No results for input(s): PROT, ALBUMIN , AST, ALT, ALKPHOS, BILITOT, BILIDIR, IBILI in the last 72 hours. PT/INR No results for input(s): LABPROT, INR in the last 72 hours.   Impression / Plan: This is a 73 y.o.female who presents for  Colonoscopy for screening.  The risks and benefits of endoscopic evaluation/treatment were discussed with the patient and/or family; these include but are not limited to the risk of perforation, infection, bleeding, missed lesions, lack of diagnosis, severe illness requiring hospitalization, as well as anesthesia and sedation related illnesses.  The patient's history has been reviewed, patient examined, no change in status, and deemed stable for procedure.  The patient and/or family is agreeable to proceed.    Aloha Finner, MD [facility] Office # [phone]

## 2024-02-11 NOTE — Telephone Encounter (Signed)
 The pt was called for an update after difficult colonoscopy this morning.  She complains of lower abd pain that is the same as earlier today. No worse but no better- she is passing gas and a small amount (about 3 drops) of BRB.  She feels like she has to urinate but only able to pass a few drops.  She has only had a 16 oz bottle of water  since getting home.

## 2024-02-11 NOTE — Progress Notes (Signed)
 Report given to PACU, vss

## 2024-02-11 NOTE — Telephone Encounter (Signed)
 Patty, OK. I would tell her/daughter, that if she can push her fluids this will be important to see if we can get things moving from a urination standpoint. Continue to monitor symptoms for the next few hours. If she feels worse to call number for OnCall but be prepared to come to the ED. If she cannot urinate at all and has progressive discomfort, she will need to come to the ED as well, because rarely anesthesia can cause urinary retention. Please followup with her tomorrow AM. Thanks. GM

## 2024-02-11 NOTE — Progress Notes (Signed)
 0940 Ephedrine 10 mg given IV due to low BP.  HR < 40 with Robinul 0.2 mg given IV. MD updated.

## 2024-02-11 NOTE — Telephone Encounter (Signed)
 The pt has been advised to push fluids and monitor sympoms. She is to call the oncall provider with any issues or proceed to the ED .  The pt has been advised of the information and verbalized understanding.

## 2024-02-11 NOTE — Op Note (Signed)
 [facility] Patient Name: Tiffany [name] Procedure Date: 02/11/2024 9:30 AM MRN: [id number] Endoscopist: Aloha Finner , MD, [id number] Age: 73 Referring MD:  Date of Birth: January 23, 1951 Gender: Female Account #: [id number] Procedure:                Colonoscopy Indications:              Screening for colorectal malignant neoplasm Medicines:                Monitored Anesthesia Care Procedure:                Pre-Anesthesia Assessment:                           - Prior to the procedure, a History and Physical                            was performed, and patient medications and                            allergies were reviewed. The patient's tolerance of                            previous anesthesia was also reviewed. The risks                            and benefits of the procedure and the sedation                            options and risks were discussed with the patient.                            All questions were answered, and informed consent                            was obtained. Prior Anticoagulants: The patient has                            taken no anticoagulant or antiplatelet agents                            except for aspirin . ASA Grade Assessment: III - A                            patient with severe systemic disease. After                            reviewing the risks and benefits, the patient was                            deemed in satisfactory condition to undergo the                            procedure.  After obtaining informed consent, the colonoscope                            was passed under direct vision. Throughout the                            procedure, the patient's blood pressure, pulse, and                            oxygen saturations were monitored continuously. The                            CF [id number] #[id number] was introduced through the anus                            and advanced to the the cecum,  identified by                            appendiceal orifice and ileocecal valve. The                            colonoscopy was somewhat difficult due to                            restricted mobility of the colon, significant                            looping and a tortuous colon. Successful completion                            of the procedure was aided by changing the                            patient's position, using manual pressure,                            withdrawing and reinserting the scope,                            straightening and shortening the scope to obtain                            bowel loop reduction and using scope torsion. The                            patient tolerated the procedure. The quality of the                            bowel preparation was adequate. The ileocecal                            valve, appendiceal orifice, and rectum were  photographed. Scope In: 9:38:02 AM Scope Out: 9:53:21 AM Scope Withdrawal Time: 0 hours 8 minutes 5 seconds  Total Procedure Duration: 0 hours 15 minutes 19 seconds  Findings:                 The digital rectal exam findings include                            hemorrhoids. Pertinent negatives include no                            palpable rectal lesions.                           The colon (entire examined portion) was                            significantly tortuous.                           A 3 mm polyp was found in the transverse colon. The                            polyp was sessile. The polyp was removed with a                            cold snare. Resection and retrieval were complete.                           Multiple medium-mouthed and small-mouthed                            diverticula were found in the colon.                           Normal mucosa was found in the entire colon                            otherwise.                           Non-bleeding non-thrombosed  internal hemorrhoids                            were found during retroflexion, during perianal                            exam and during digital exam. The hemorrhoids were                            Grade II (internal hemorrhoids that prolapse but                            reduce spontaneously). Complications:            No immediate complications. Estimated Blood Loss:     Estimated blood loss was minimal. Impression:               -  Hemorrhoids found on digital rectal exam.                           - Tortuous colon.                           - One 3 mm polyp in the transverse colon, removed                            with a cold snare. Resected and retrieved.                           - Diverticulosis.                           - Normal mucosa in the entire examined colon                            otherwise.                           - Non-bleeding non-thrombosed internal hemorrhoids. Recommendation:           - The patient will be observed post-procedure,                            until all discharge criteria are met.                           - Discharge patient to home.                           - Patient has a contact number available for                            emergencies. The signs and symptoms of potential                            delayed complications were discussed with the                            patient. Return to normal activities tomorrow.                            Written discharge instructions were provided to the                            patient.                           - High fiber diet.                           - Use FiberCon 1-2 tablets PO daily.                           - Continue present medications.                           -  Await pathology results.                           - Repeat colonoscopy in 5-10 years for surveillance                            based on pathology results. Use abdominal binder                            and  pediatric colonoscope for next procedure.                           - The findings and recommendations were discussed                            with the patient.                           - The findings and recommendations were discussed                            with the patient's family. Aloha Finner, MD 02/11/2024 9:58:57 AM

## 2024-02-11 NOTE — Progress Notes (Signed)
 Called to room to assist during endoscopic procedure.  Patient ID and intended procedure confirmed with present staff. Received instructions for my participation in the procedure from the performing physician.

## 2024-02-11 NOTE — Patient Instructions (Signed)
 Resume previous diet Continue present medications Take miralax  one capful in coffee or water  daily or twice daily for constipation Await pathology results  Handouts/information given for constipation, polyps, diverticulosis   YOU HAD AN ENDOSCOPIC PROCEDURE TODAY AT THE [facility]:   Refer to the procedure report that was given to you for any specific questions about what was found during the examination.  If the procedure report does not answer your questions, please call your gastroenterologist to clarify.  If you requested that your care partner not be given the details of your procedure findings, then the procedure report has been included in a sealed envelope for you to review at your convenience later.  YOU SHOULD EXPECT: Some feelings of bloating in the abdomen. Passage of more gas than usual.  Walking can help get rid of the air that was put into your GI tract during the procedure and reduce the bloating. If you had a lower endoscopy (such as a colonoscopy or flexible sigmoidoscopy) you may notice spotting of blood in your stool or on the toilet paper. If you underwent a bowel prep for your procedure, you may not have a normal bowel movement for a few days.  Please Note:  You might notice some irritation and congestion in your nose or some drainage.  This is from the oxygen used during your procedure.  There is no need for concern and it should clear up in a day or so.  SYMPTOMS TO REPORT IMMEDIATELY:  Following lower endoscopy (colonoscopy):  Excessive amounts of blood in the stool  Significant tenderness or worsening of abdominal pains  Swelling of the abdomen that is new, acute  Fever of 100F or higher For urgent or emergent issues, a gastroenterologist can be reached at any hour by calling [phone]. Do not use MyChart messaging for urgent concerns.   DIET:  We do recommend a small meal at first, but then you may proceed to your regular diet.  Drink plenty of  fluids but you should avoid alcoholic beverages for 24 hours.  ACTIVITY:  You should plan to take it easy for the rest of today and you should NOT DRIVE or use heavy machinery until tomorrow (because of the sedation medicines used during the test).    FOLLOW UP: Our staff will call the number listed on your records the next business day following your procedure.  We will call around 7:15- 8:00 am to check on you and address any questions or concerns that you may have regarding the information given to you following your procedure. If we do not reach you, we will leave a message.     If any biopsies were taken you will be contacted by phone or by letter within the next 1-3 weeks.  Please call us  at [phone] if you have not heard about the biopsies in 3 weeks.   SIGNATURES/CONFIDENTIALITY: You and/or your care partner have signed paperwork which will be entered into your electronic medical record.  These signatures attest to the fact that that the information above on your After Visit Summary has been reviewed and is understood.  Full responsibility of the confidentiality of this discharge information lies with you and/or your care-partner.

## 2024-02-12 ENCOUNTER — Ambulatory Visit: Payer: Self-pay | Admitting: Cardiovascular Disease

## 2024-02-12 ENCOUNTER — Telehealth: Payer: Self-pay

## 2024-02-12 NOTE — Telephone Encounter (Signed)
  Follow up Call-     02/11/2024    8:25 AM  Call back number  Post procedure Call Back phone  # [phone]  Permission to leave phone message Yes     Patient questions:  Do you have a fever, pain , or abdominal swelling? Yes.   Pain Score  5 *  Have you tolerated food without any problems? Yes.    Have you been able to return to your normal activities? Yes.    Do you have any questions about your discharge instructions: Diet   No. Medications  No. Follow up visit  No.  Do you have questions or concerns about your Care? No.  Actions: * If pain score is 4 or above: Physician/ provider Notified : Aloha Finner, MD.

## 2024-02-12 NOTE — Telephone Encounter (Signed)
 Thank you for update. I am glad that she is feeling somewhat better per her report but this seems worse than yesterday upon leaving the clinic. I am concerned that she continues to have this pain/discomfort. I recommend a stat CTAP be performed for the patient. If she really feels things are getting better and she wants to hold on a CT scan, then we can monitor a bit longer. If she goes this route and things are worsening or not further improving by later this afternoon, then certainly needs CT scan or go to the ED.  Patty, Can you followup with this patient please as able? Thanks. GM

## 2024-02-12 NOTE — Telephone Encounter (Signed)
 Please let me know how she is doing on Thursday. GM

## 2024-02-12 NOTE — Telephone Encounter (Signed)
 This is good news. Thanks again [facility] for followup. GM

## 2024-02-12 NOTE — Telephone Encounter (Signed)
 The pt states that she was still mostly asleep when the nurse called this morning and misspoke.  She is doing much better at this time with no pain and she is passing gas and urine normally.  She has been advised that if she does not continue to improve she will call our office back or go to the ED.

## 2024-02-16 ENCOUNTER — Ambulatory Visit: Payer: Self-pay | Admitting: Gastroenterology

## 2024-02-16 LAB — SURGICAL PATHOLOGY

## 2024-02-16 NOTE — Progress Notes (Signed)
 Remote Loop Recorder Transmission

## 2024-02-19 NOTE — Progress Notes (Signed)
 Remote Loop Recorder Transmission

## 2024-02-23 ENCOUNTER — Inpatient Hospital Stay: Payer: Medicare Other

## 2024-03-03 NOTE — Progress Notes (Signed)
 Remote Loop Recorder Transmission

## 2024-03-11 ENCOUNTER — Inpatient Hospital Stay

## 2024-03-11 ENCOUNTER — Inpatient Hospital Stay (INDEPENDENT_AMBULATORY_CARE_PROVIDER_SITE_OTHER)

## 2024-03-11 DIAGNOSIS — I639 Cerebral infarction, unspecified: Secondary | ICD-10-CM

## 2024-03-11 LAB — CUP PACEART REMOTE DEVICE CHECK
Date Time Interrogation Session: 20251015231813
Implantable Pulse Generator Implant Date: 20220802

## 2024-03-17 ENCOUNTER — Ambulatory Visit: Payer: Self-pay | Admitting: Cardiovascular Disease

## 2024-03-17 NOTE — Progress Notes (Signed)
 Remote Loop Recorder Transmission

## 2024-03-29 ENCOUNTER — Encounter: Payer: Self-pay | Admitting: Radiology

## 2024-03-29 ENCOUNTER — Inpatient Hospital Stay: Payer: Medicare Other

## 2024-04-11 ENCOUNTER — Inpatient Hospital Stay

## 2024-04-12 ENCOUNTER — Inpatient Hospital Stay

## 2024-04-12 ENCOUNTER — Other Ambulatory Visit: Payer: Self-pay | Admitting: Internal Medicine

## 2024-04-13 ENCOUNTER — Other Ambulatory Visit: Payer: Self-pay

## 2024-04-14 ENCOUNTER — Ambulatory Visit: Attending: Cardiovascular Disease

## 2024-04-14 DIAGNOSIS — I639 Cerebral infarction, unspecified: Secondary | ICD-10-CM | POA: Diagnosis not present

## 2024-04-15 LAB — CUP PACEART REMOTE DEVICE CHECK
Date Time Interrogation Session: 20251118231907
Implantable Pulse Generator Implant Date: 20220802

## 2024-04-16 NOTE — Progress Notes (Signed)
 Remote Loop Recorder Transmission

## 2024-04-18 ENCOUNTER — Ambulatory Visit: Payer: Self-pay | Admitting: Cardiovascular Disease

## 2024-05-03 ENCOUNTER — Inpatient Hospital Stay: Payer: Medicare Other

## 2024-05-12 ENCOUNTER — Inpatient Hospital Stay

## 2024-05-15 ENCOUNTER — Ambulatory Visit

## 2024-05-15 DIAGNOSIS — I639 Cerebral infarction, unspecified: Secondary | ICD-10-CM | POA: Diagnosis not present

## 2024-05-16 LAB — CUP PACEART REMOTE DEVICE CHECK
Date Time Interrogation Session: 20251219231725
Implantable Pulse Generator Implant Date: 20220802

## 2024-05-17 NOTE — Progress Notes (Signed)
 Remote Loop Recorder Transmission

## 2024-05-22 ENCOUNTER — Ambulatory Visit: Payer: Self-pay | Admitting: Cardiovascular Disease

## 2024-06-07 ENCOUNTER — Other Ambulatory Visit: Payer: Self-pay

## 2024-06-07 ENCOUNTER — Ambulatory Visit: Admitting: Orthopedic Surgery

## 2024-06-07 DIAGNOSIS — Z981 Arthrodesis status: Secondary | ICD-10-CM | POA: Diagnosis not present

## 2024-06-07 DIAGNOSIS — M545 Low back pain, unspecified: Secondary | ICD-10-CM | POA: Diagnosis not present

## 2024-06-07 MED ORDER — METHYLPREDNISOLONE 4 MG PO TBPK: ORAL_TABLET | ORAL | 0 refills | Status: AC

## 2024-06-07 NOTE — Progress Notes (Signed)
 Orthopedic Spine Surgery Office Note  Assessment: Patient is a 74 y.o. female with 3 complaints.  Increased neck pain after a fall.  No radicular symptoms.  Chronic low back pain with no radicular symptoms.  Has a significantly degenerative disc at L5/S1.  Right heel pain in the areas of the Achilles tendon, seems consistent with Achilles tendinitis   Plan: -Patient has tried Tylenol , ibuprofen -Recommended trial of a Medrol  Dosepak.  This was prescribed to her today -For her right sided Achilles pain, recommended daily stretching.  Provided her with exercises to stretch the Achilles.  Should ice the area.  Also recommended Voltaren  gel to the area.  Told her that this would take several weeks to get better -In regards to her low back, she has chronic low back pain.  I told her that she should continue to use Tylenol .  She can use lidocaine  patches over the area.  If she is not any better with these treatments at some time, we will get an MRI to evaluate further -In regards to her neck, she had a fall with acute worsening pain and midline tenderness, so recommended a CT scan to evaluate further -Patient should return to office in 4-5 weeks, x-rays at next visit: none   Patient expressed understanding of the plan and all questions were answered to the patient's satisfaction.   ___________________________________________________________________________   History:  Patient is a 74 y.o. female who presents today for cervical, lumbar, right heel pain.  Patient is not having any radiating arm pain.  She has noticed neck pain.  It has gotten significantly worse after her fall about 3 weeks ago.  She feels the pain in the lower cervical region.  She said a stool broke as she stepped on it to get out of bed which is what caused the fall.  She felt and heard some crunching in her neck.  She also wanted talk about her lumbar spine.  She has had chronic low back pain.  She has had this for several years.   It has gotten worse since the fall as well.  She has had to lean side-to-side and she keeps changing positions to help with the pain.  She has no pain radiating into either lower extremity.  Finally, she has had for the last 6 weeks pain in the right heel near the Achilles insertion at the calcaneus.  There was no trauma or injury that preceded the onset of this pain.  She has no pain elsewhere in the foot and ankle.  She notes it is worse if she is walking a lot.  She has not tried any specific treatments for this.  She has no bowel or bladder incontinence.  No saddle anesthesia.  No difficulty with fine motor skills in the hands.  Has had issues with imbalance chronically and uses a cane.  No recent changes in imbalance.  Treatments tried: Tylenol , ibuprofen   Physical Exam:  General: no acute distress, appears stated age Neurologic: alert, answering questions appropriately, following commands Respiratory: unlabored breathing on room air, symmetric chest rise Psychiatric: appropriate affect, normal cadence to speech   MSK (spine):  -Strength exam      Left  Right Grip strength                5/5  5/5 Interosseus   5/5   5/5 Wrist extension  5/5  5/5 Wrist flexion   5/5  5/5 Elbow flexion   5/5  5/5 Deltoid  4+/5  4+/5  EHL    4/5  4/5 TA    5/5  5/5 GSC    5/5  5/5 Knee extension  5/5  5/5 Hip flexion   5/5  5/5  -Sensory exam    Sensation intact to light touch in L3-S1 nerve distributions of bilateral lower extremities  Sensation intact to light touch in C5-T1 nerve distributions of bilateral upper extremities  -Brachioradialis DTR: 1/4 on the left, 1/4 on the right -Biceps DTR: 1/4 on the left, 1/4 on the right -Achilles DTR: 1/4 on the left, 1/4 on the right -Patellar tendon DTR: 1/4 on the left, 1/4 on the right  -Spurling: Negative bilaterally -Hoffman sign: Negative bilaterally -Clonus: No beats bilaterally -Interosseous wasting: None seen -Grip and release  test: Negative  Midline tenderness to palpation over the cervical spine, no other tenderness palpation of midline over the remainder of the spine  Left shoulder exam: No pain through range of motion Right shoulder exam: No pain through range of motion Left hip exam: No pain through range of motion Right hip exam: No pain to range of motion  Right ankle exam: Tender palpation over the distal aspect of the Achilles near the insertion, negative Thompson test, able to actively plantarflex against resistance, no palpable gaps over the Achilles, no open wounds seen, no gross deformity seen  Imaging: XRs of the cervical spine from 06/07/2024 were independently reviewed and interpreted, showing allograft interbody devices at C3/4 and C4/5.  There is anterior instrumentation from C3-C5.  No lucency seen around the instrumentation.  None of the screws have backed out.  No fracture or dislocation seen.  No evidence of instability on flexion/extension views.  Disc height loss and small anterior osteophyte formation seen at C5/6 and C6/7.  XRs of the lumbar spine from 06/07/2024 were independently reviewed and interpreted, showing posterior instrumentation at L4 and L5.  No lucency seen around the screws.  None of the screws have backed out.  One of the L4 screws appears to violate the endplate of L4.  There is an interbody device at L4/5.  The interbody appears near the posterior aspect of the vertebral bodies.  Disc height loss with anterior osteophyte formation at L5/S1.  No fracture or dislocation seen.  No evidence of instability on flexion/extension views.   Patient name: Tiffany [name] Patient MRN: [id number] Date of visit: 06/07/2024   I spent 30 minutes in the room with the patient going over her 3 various complaints.  We went over the history of these pains.  I also uses time to examine her in these 3 areas.  I also been talked about the plan for each of these.  I answered her questions to her  satisfaction with this time.

## 2024-06-10 ENCOUNTER — Encounter: Payer: Self-pay | Admitting: Orthopedic Surgery

## 2024-06-12 ENCOUNTER — Inpatient Hospital Stay

## 2024-06-15 ENCOUNTER — Ambulatory Visit

## 2024-06-15 DIAGNOSIS — I639 Cerebral infarction, unspecified: Secondary | ICD-10-CM | POA: Diagnosis not present

## 2024-06-16 ENCOUNTER — Ambulatory Visit
Admission: RE | Admit: 2024-06-16 | Discharge: 2024-06-16 | Disposition: A | Discharge status: Home / Self Care | Source: Ambulatory Visit | Attending: Orthopedic Surgery | Admitting: Orthopedic Surgery

## 2024-06-16 DIAGNOSIS — Z981 Arthrodesis status: Secondary | ICD-10-CM

## 2024-06-16 LAB — CUP PACEART REMOTE DEVICE CHECK
Date Time Interrogation Session: 20260119231133
Implantable Pulse Generator Implant Date: 20220802

## 2024-06-17 ENCOUNTER — Other Ambulatory Visit: Payer: Self-pay | Admitting: Gastroenterology

## 2024-06-18 NOTE — Progress Notes (Signed)
 Remote Loop Recorder Transmission

## 2024-06-20 ENCOUNTER — Ambulatory Visit: Payer: Self-pay | Admitting: Cardiovascular Disease

## 2024-06-28 ENCOUNTER — Ambulatory Visit: Admitting: Orthopedic Surgery

## 2024-07-05 ENCOUNTER — Ambulatory Visit: Admitting: Orthopedic Surgery

## 2024-07-13 ENCOUNTER — Inpatient Hospital Stay

## 2024-07-14 ENCOUNTER — Ambulatory Visit: Admitting: Internal Medicine

## 2024-07-16 ENCOUNTER — Ambulatory Visit

## 2024-08-11 ENCOUNTER — Ambulatory Visit: Admitting: Orthopedic Surgery

## 2024-08-16 ENCOUNTER — Ambulatory Visit

## 2024-09-16 ENCOUNTER — Ambulatory Visit

## 2024-10-17 ENCOUNTER — Ambulatory Visit

## 2024-11-17 ENCOUNTER — Ambulatory Visit

## 2024-12-18 ENCOUNTER — Ambulatory Visit

## 2025-01-18 ENCOUNTER — Ambulatory Visit

## 2025-02-18 ENCOUNTER — Ambulatory Visit

## 2025-03-21 ENCOUNTER — Ambulatory Visit

## 2025-04-21 ENCOUNTER — Ambulatory Visit

## 2025-05-22 ENCOUNTER — Ambulatory Visit

## 2025-06-22 ENCOUNTER — Ambulatory Visit
